# Patient Record
Sex: Male | Born: 1955 | Race: White | Hispanic: No | Marital: Married | Smoking: Never smoker
Health system: Southern US, Community
[De-identification: ages and names within clinical notes are randomized; demographics above are authoritative.]

## PROBLEM LIST (undated history)

## (undated) DIAGNOSIS — E785 Hyperlipidemia, unspecified: Secondary | ICD-10-CM

## (undated) DIAGNOSIS — K219 Gastro-esophageal reflux disease without esophagitis: Secondary | ICD-10-CM

## (undated) DIAGNOSIS — J189 Pneumonia, unspecified organism: Secondary | ICD-10-CM

## (undated) DIAGNOSIS — M797 Fibromyalgia: Secondary | ICD-10-CM

## (undated) DIAGNOSIS — E7800 Pure hypercholesterolemia, unspecified: Secondary | ICD-10-CM

## (undated) DIAGNOSIS — N400 Enlarged prostate without lower urinary tract symptoms: Secondary | ICD-10-CM

## (undated) DIAGNOSIS — K317 Polyp of stomach and duodenum: Secondary | ICD-10-CM

## (undated) DIAGNOSIS — I2510 Atherosclerotic heart disease of native coronary artery without angina pectoris: Secondary | ICD-10-CM

## (undated) DIAGNOSIS — I77810 Thoracic aortic ectasia: Secondary | ICD-10-CM

## (undated) DIAGNOSIS — E669 Obesity, unspecified: Secondary | ICD-10-CM

## (undated) DIAGNOSIS — I10 Essential (primary) hypertension: Secondary | ICD-10-CM

## (undated) DIAGNOSIS — K449 Diaphragmatic hernia without obstruction or gangrene: Secondary | ICD-10-CM

## (undated) DIAGNOSIS — I208 Other forms of angina pectoris: Secondary | ICD-10-CM

## (undated) DIAGNOSIS — Z8739 Personal history of other diseases of the musculoskeletal system and connective tissue: Secondary | ICD-10-CM

## (undated) DIAGNOSIS — K222 Esophageal obstruction: Secondary | ICD-10-CM

## (undated) DIAGNOSIS — J42 Unspecified chronic bronchitis: Secondary | ICD-10-CM

## (undated) HISTORY — DX: Atherosclerotic heart disease of native coronary artery without angina pectoris: I25.10

## (undated) HISTORY — DX: Esophageal obstruction: K22.2

## (undated) HISTORY — DX: Thoracic aortic ectasia: I77.810

## (undated) HISTORY — DX: Polyp of stomach and duodenum: K31.7

## (undated) HISTORY — PX: COLONOSCOPY W/ POLYPECTOMY: SHX1380

## (undated) HISTORY — DX: Essential (primary) hypertension: I10

## (undated) HISTORY — DX: Diaphragmatic hernia without obstruction or gangrene: K44.9

## (undated) HISTORY — PX: ESOPHAGOGASTRODUODENOSCOPY (EGD) WITH ESOPHAGEAL DILATION: SHX5812

## (undated) HISTORY — DX: Benign prostatic hyperplasia without lower urinary tract symptoms: N40.0

## (undated) HISTORY — DX: Fibromyalgia: M79.7

## (undated) HISTORY — DX: Hyperlipidemia, unspecified: E78.5

## (undated) HISTORY — DX: Obesity, unspecified: E66.9

## (undated) HISTORY — DX: Gastro-esophageal reflux disease without esophagitis: K21.9

## (undated) HISTORY — PX: CORONARY ANGIOPLASTY WITH STENT PLACEMENT: SHX49

---

## 1999-11-13 ENCOUNTER — Emergency Department (HOSPITAL_COMMUNITY): Admission: EM | Admit: 1999-11-13 | Discharge: 1999-11-14 | Payer: Self-pay | Admitting: Emergency Medicine

## 1999-11-13 ENCOUNTER — Encounter: Payer: Self-pay | Admitting: Emergency Medicine

## 2000-01-14 ENCOUNTER — Encounter (INDEPENDENT_AMBULATORY_CARE_PROVIDER_SITE_OTHER): Payer: Self-pay

## 2000-01-14 ENCOUNTER — Other Ambulatory Visit: Admission: RE | Admit: 2000-01-14 | Discharge: 2000-01-14 | Payer: Self-pay | Admitting: *Deleted

## 2004-09-02 ENCOUNTER — Ambulatory Visit: Payer: Self-pay | Admitting: Cardiology

## 2004-09-04 ENCOUNTER — Ambulatory Visit (HOSPITAL_COMMUNITY): Admission: RE | Admit: 2004-09-04 | Discharge: 2004-09-05 | Payer: Self-pay | Admitting: Cardiology

## 2004-09-04 ENCOUNTER — Ambulatory Visit: Payer: Self-pay | Admitting: Cardiology

## 2004-09-18 ENCOUNTER — Ambulatory Visit: Payer: Self-pay | Admitting: Cardiology

## 2004-12-24 ENCOUNTER — Ambulatory Visit: Payer: Self-pay | Admitting: Cardiology

## 2004-12-25 ENCOUNTER — Emergency Department (HOSPITAL_COMMUNITY): Admission: EM | Admit: 2004-12-25 | Discharge: 2004-12-25 | Payer: Self-pay | Admitting: Emergency Medicine

## 2004-12-30 ENCOUNTER — Ambulatory Visit: Payer: Self-pay | Admitting: Cardiology

## 2005-02-20 ENCOUNTER — Ambulatory Visit: Payer: Self-pay | Admitting: Internal Medicine

## 2006-02-10 ENCOUNTER — Ambulatory Visit: Payer: Self-pay | Admitting: Internal Medicine

## 2006-02-10 LAB — CONVERTED CEMR LAB
ALT: 37 units/L (ref 0–40)
AST: 25 units/L (ref 0–37)
Albumin: 3.6 g/dL (ref 3.5–5.2)
Alkaline Phosphatase: 84 units/L (ref 39–117)
BUN: 14 mg/dL (ref 6–23)
Basophils Absolute: 0 10*3/uL (ref 0.0–0.1)
Basophils Relative: 0.1 % (ref 0.0–1.0)
CO2: 29 meq/L (ref 19–32)
Calcium: 9.5 mg/dL (ref 8.4–10.5)
Chloride: 105 meq/L (ref 96–112)
Chol/HDL Ratio, serum: 5.8
Cholesterol: 223 mg/dL (ref 0–200)
Creatinine, Ser: 0.9 mg/dL (ref 0.4–1.5)
Eosinophil percent: 2.9 % (ref 0.0–5.0)
GFR calc non Af Amer: 95 mL/min
Glomerular Filtration Rate, Af Am: 115 mL/min/{1.73_m2}
Glucose, Bld: 96 mg/dL (ref 70–99)
HCT: 50.6 % (ref 39.0–52.0)
HDL: 38.6 mg/dL — ABNORMAL LOW (ref >39.0)
Hemoglobin: 16.6 g/dL (ref 13.0–17.0)
LDL DIRECT: 154.2 mg/dL
Lymphocytes Relative: 13.7 % (ref 12.0–46.0)
MCHC: 32.9 g/dL (ref 30.0–36.0)
MCV: 92.3 fL (ref 78.0–100.0)
Monocytes Absolute: 1.4 10*3/uL — ABNORMAL HIGH (ref 0.2–0.7)
Monocytes Relative: 11.4 % — ABNORMAL HIGH (ref 3.0–11.0)
Neutro Abs: 9.2 10*3/uL — ABNORMAL HIGH (ref 1.4–7.7)
Neutrophils Relative %: 71.9 % (ref 43.0–77.0)
PSA: 0.87 ng/mL (ref 0.10–4.00)
Platelets: 303 10*3/uL (ref 150–400)
Potassium: 4 meq/L (ref 3.5–5.1)
RBC: 5.48 M/uL (ref 4.22–5.81)
RDW: 12.5 % (ref 11.5–14.6)
Sodium: 142 meq/L (ref 135–145)
TSH: 2.22 microintl units/mL (ref 0.35–5.50)
Total Bilirubin: 1.1 mg/dL (ref 0.3–1.2)
Total Protein: 7.9 g/dL (ref 6.0–8.3)
Triglyceride fasting, serum: 147 mg/dL (ref 0–149)
VLDL: 29 mg/dL (ref 0–40)
WBC: 12.7 10*3/uL — ABNORMAL HIGH (ref 4.5–10.5)

## 2006-02-11 ENCOUNTER — Ambulatory Visit: Payer: Self-pay | Admitting: Cardiology

## 2006-02-16 ENCOUNTER — Ambulatory Visit: Payer: Self-pay

## 2006-02-24 ENCOUNTER — Ambulatory Visit: Payer: Self-pay | Admitting: Internal Medicine

## 2006-03-05 ENCOUNTER — Ambulatory Visit: Payer: Self-pay | Admitting: Cardiology

## 2006-03-05 LAB — CONVERTED CEMR LAB
BUN: 10 mg/dL (ref 6–23)
Chloride: 100 meq/L (ref 96–112)
Creatinine, Ser: 0.8 mg/dL (ref 0.4–1.5)
GFR calc non Af Amer: 109 mL/min

## 2006-04-09 ENCOUNTER — Ambulatory Visit: Payer: Self-pay | Admitting: Cardiology

## 2006-04-15 ENCOUNTER — Ambulatory Visit: Payer: Self-pay | Admitting: Internal Medicine

## 2006-04-15 LAB — CONVERTED CEMR LAB
ALT: 38 units/L (ref 0–40)
AST: 32 units/L (ref 0–37)
Albumin: 3.9 g/dL (ref 3.5–5.2)
Alkaline Phosphatase: 87 units/L (ref 39–117)
BUN: 16 mg/dL (ref 6–23)
Bilirubin, Direct: 0.2 mg/dL (ref 0.0–0.3)
CO2: 30 meq/L (ref 19–32)
Calcium: 9.3 mg/dL (ref 8.4–10.5)
Chloride: 103 meq/L (ref 96–112)
Cholesterol: 144 mg/dL (ref 0–200)
Creatinine, Ser: 0.8 mg/dL (ref 0.4–1.5)
GFR calc Af Amer: 132 mL/min
GFR calc non Af Amer: 109 mL/min
Glucose, Bld: 79 mg/dL (ref 70–99)
HDL: 32.5 mg/dL — ABNORMAL LOW (ref >39.0)
LDL Cholesterol: 88 mg/dL (ref 0–99)
Potassium: 3.9 meq/L (ref 3.5–5.1)
Sodium: 143 meq/L (ref 135–145)
Total Bilirubin: 1.1 mg/dL (ref 0.3–1.2)
Total CHOL/HDL Ratio: 4.4
Total Protein: 7.6 g/dL (ref 6.0–8.3)
Triglycerides: 119 mg/dL (ref 0–149)
VLDL: 24 mg/dL (ref 0–40)

## 2006-05-26 ENCOUNTER — Ambulatory Visit: Payer: Self-pay | Admitting: Cardiology

## 2006-09-16 DIAGNOSIS — IMO0001 Reserved for inherently not codable concepts without codable children: Secondary | ICD-10-CM | POA: Insufficient documentation

## 2006-09-16 DIAGNOSIS — K219 Gastro-esophageal reflux disease without esophagitis: Secondary | ICD-10-CM | POA: Insufficient documentation

## 2006-09-16 DIAGNOSIS — I10 Essential (primary) hypertension: Secondary | ICD-10-CM | POA: Insufficient documentation

## 2006-09-16 DIAGNOSIS — N400 Enlarged prostate without lower urinary tract symptoms: Secondary | ICD-10-CM | POA: Insufficient documentation

## 2007-06-30 ENCOUNTER — Ambulatory Visit: Payer: Self-pay | Admitting: Internal Medicine

## 2007-06-30 LAB — CONVERTED CEMR LAB
Albumin: 4.2 g/dL (ref 3.5–5.2)
Alkaline Phosphatase: 75 units/L (ref 39–117)
BUN: 12 mg/dL (ref 6–23)
Basophils Relative: 0.5 % (ref 0.0–1.0)
Blood in Urine, dipstick: NEGATIVE
Creatinine, Ser: 0.9 mg/dL (ref 0.4–1.5)
Eosinophils Absolute: 0.2 10*3/uL (ref 0.0–0.7)
Eosinophils Relative: 2.4 % (ref 0.0–5.0)
GFR calc Af Amer: 114 mL/min
GFR calc non Af Amer: 95 mL/min
Glucose, Bld: 106 mg/dL — ABNORMAL HIGH (ref 70–99)
Glucose, Urine, Semiquant: NEGATIVE
HCT: 49.3 % (ref 39.0–52.0)
HDL: 29.5 mg/dL — ABNORMAL LOW (ref >39.0)
Hemoglobin: 16.3 g/dL (ref 13.0–17.0)
MCV: 91.7 fL (ref 78.0–100.0)
Monocytes Absolute: 1 10*3/uL (ref 0.1–1.0)
Monocytes Relative: 12.8 % — ABNORMAL HIGH (ref 3.0–12.0)
Neutro Abs: 4.7 10*3/uL (ref 1.4–7.7)
Nitrite: NEGATIVE
PSA: 0.54 ng/mL (ref 0.10–4.00)
Platelets: 311 10*3/uL (ref 150–400)
Potassium: 4.2 meq/L (ref 3.5–5.1)
RBC: 5.37 M/uL (ref 4.22–5.81)
Specific Gravity, Urine: 1.015
Total CHOL/HDL Ratio: 5.6
Total Protein: 7.7 g/dL (ref 6.0–8.3)
WBC Urine, dipstick: NEGATIVE
WBC: 7.7 10*3/uL (ref 4.5–10.5)
pH: 6

## 2007-07-07 ENCOUNTER — Ambulatory Visit: Payer: Self-pay | Admitting: Internal Medicine

## 2007-07-07 DIAGNOSIS — M109 Gout, unspecified: Secondary | ICD-10-CM | POA: Insufficient documentation

## 2007-07-07 DIAGNOSIS — I2510 Atherosclerotic heart disease of native coronary artery without angina pectoris: Secondary | ICD-10-CM | POA: Insufficient documentation

## 2007-07-14 ENCOUNTER — Ambulatory Visit: Payer: Self-pay

## 2007-07-19 ENCOUNTER — Encounter: Payer: Self-pay | Admitting: Internal Medicine

## 2007-07-19 ENCOUNTER — Ambulatory Visit: Payer: Self-pay

## 2007-07-26 ENCOUNTER — Encounter: Payer: Self-pay | Admitting: Internal Medicine

## 2007-08-25 ENCOUNTER — Telehealth: Payer: Self-pay | Admitting: Internal Medicine

## 2007-08-27 DIAGNOSIS — K449 Diaphragmatic hernia without obstruction or gangrene: Secondary | ICD-10-CM | POA: Insufficient documentation

## 2007-08-27 DIAGNOSIS — D131 Benign neoplasm of stomach: Secondary | ICD-10-CM | POA: Insufficient documentation

## 2007-08-27 DIAGNOSIS — K222 Esophageal obstruction: Secondary | ICD-10-CM | POA: Insufficient documentation

## 2007-08-31 ENCOUNTER — Ambulatory Visit: Payer: Self-pay | Admitting: Internal Medicine

## 2007-11-18 ENCOUNTER — Encounter: Payer: Self-pay | Admitting: Internal Medicine

## 2007-11-22 ENCOUNTER — Ambulatory Visit: Payer: Self-pay | Admitting: Internal Medicine

## 2007-11-23 ENCOUNTER — Encounter: Payer: Self-pay | Admitting: Internal Medicine

## 2007-11-23 ENCOUNTER — Ambulatory Visit: Payer: Self-pay | Admitting: Internal Medicine

## 2007-11-26 ENCOUNTER — Encounter: Payer: Self-pay | Admitting: Internal Medicine

## 2007-12-01 ENCOUNTER — Ambulatory Visit: Payer: Self-pay | Admitting: Internal Medicine

## 2007-12-01 DIAGNOSIS — E785 Hyperlipidemia, unspecified: Secondary | ICD-10-CM | POA: Insufficient documentation

## 2007-12-01 LAB — HM COLONOSCOPY

## 2007-12-07 ENCOUNTER — Telehealth: Payer: Self-pay | Admitting: Internal Medicine

## 2007-12-07 LAB — CONVERTED CEMR LAB
AST: 64 units/L — ABNORMAL HIGH (ref 0–37)
Alkaline Phosphatase: 77 units/L (ref 39–117)
Bilirubin, Direct: 0.2 mg/dL (ref 0.0–0.3)
Chloride: 103 meq/L (ref 96–112)
Cholesterol: 163 mg/dL (ref 0–200)
GFR calc Af Amer: 114 mL/min
GFR calc non Af Amer: 95 mL/min
Glucose, Bld: 100 mg/dL — ABNORMAL HIGH (ref 70–99)
LDL Cholesterol: 94 mg/dL (ref 0–99)
Potassium: 3.8 meq/L (ref 3.5–5.1)
Sodium: 145 meq/L (ref 135–145)
VLDL: 37 mg/dL (ref 0–40)

## 2007-12-15 ENCOUNTER — Ambulatory Visit: Payer: Self-pay | Admitting: Family Medicine

## 2007-12-15 LAB — CONVERTED CEMR LAB
Basophils Absolute: 0 10*3/uL (ref 0.0–0.1)
Basophils Relative: 0 % (ref 0.0–3.0)
Eosinophils Absolute: 0 10*3/uL (ref 0.0–0.7)
Hemoglobin: 16.5 g/dL (ref 13.0–17.0)
MCHC: 34.9 g/dL (ref 30.0–36.0)
MCV: 89.4 fL (ref 78.0–100.0)
Monocytes Absolute: 0.8 10*3/uL (ref 0.1–1.0)
Neutro Abs: 5.5 10*3/uL (ref 1.4–7.7)
RBC: 5.3 M/uL (ref 4.22–5.81)
RDW: 12.3 % (ref 11.5–14.6)

## 2007-12-22 ENCOUNTER — Telehealth: Payer: Self-pay | Admitting: Family Medicine

## 2008-07-13 ENCOUNTER — Ambulatory Visit: Payer: Self-pay | Admitting: Internal Medicine

## 2008-07-13 LAB — CONVERTED CEMR LAB
AST: 36 units/L (ref 0–37)
Albumin: 4.1 g/dL (ref 3.5–5.2)
Alkaline Phosphatase: 92 units/L (ref 39–117)
Basophils Relative: 0 % (ref 0.0–3.0)
Blood in Urine, dipstick: NEGATIVE
CO2: 28 meq/L (ref 19–32)
Calcium: 9.3 mg/dL (ref 8.4–10.5)
Eosinophils Absolute: 0.1 10*3/uL (ref 0.0–0.7)
HDL: 37.6 mg/dL — ABNORMAL LOW (ref >39.00)
Hemoglobin: 15.9 g/dL (ref 13.0–17.0)
Lymphocytes Relative: 22 % (ref 12.0–46.0)
MCHC: 34 g/dL (ref 30.0–36.0)
Monocytes Relative: 14.8 % — ABNORMAL HIGH (ref 3.0–12.0)
Neutro Abs: 4.7 10*3/uL (ref 1.4–7.7)
Nitrite: NEGATIVE
Potassium: 4.5 meq/L (ref 3.5–5.1)
RBC: 4.93 M/uL (ref 4.22–5.81)
Sodium: 143 meq/L (ref 135–145)
Specific Gravity, Urine: 1.02
Total CHOL/HDL Ratio: 4
Total Protein: 7.5 g/dL (ref 6.0–8.3)
WBC Urine, dipstick: NEGATIVE

## 2008-08-29 ENCOUNTER — Ambulatory Visit: Payer: Self-pay | Admitting: Internal Medicine

## 2008-09-07 ENCOUNTER — Telehealth: Payer: Self-pay | Admitting: Internal Medicine

## 2009-02-15 ENCOUNTER — Ambulatory Visit: Payer: Self-pay | Admitting: Family Medicine

## 2009-07-06 ENCOUNTER — Ambulatory Visit: Payer: Self-pay | Admitting: Cardiovascular Disease

## 2009-08-29 ENCOUNTER — Ambulatory Visit: Payer: Self-pay | Admitting: Internal Medicine

## 2009-08-29 LAB — CONVERTED CEMR LAB
ALT: 29 units/L (ref 0–53)
AST: 30 units/L (ref 0–37)
Basophils Relative: 0.7 % (ref 0.0–3.0)
Bilirubin, Direct: 0.2 mg/dL (ref 0.0–0.3)
Chloride: 103 meq/L (ref 96–112)
Creatinine, Ser: 0.8 mg/dL (ref 0.4–1.5)
Eosinophils Relative: 3 % (ref 0.0–5.0)
HCT: 45.7 % (ref 39.0–52.0)
MCV: 94.2 fL (ref 78.0–100.0)
Monocytes Absolute: 1.1 10*3/uL — ABNORMAL HIGH (ref 0.1–1.0)
Monocytes Relative: 14 % — ABNORMAL HIGH (ref 3.0–12.0)
Neutrophils Relative %: 54.5 % (ref 43.0–77.0)
Nitrite: NEGATIVE
PSA: 1.03 ng/mL (ref 0.10–4.00)
Potassium: 4.6 meq/L (ref 3.5–5.1)
RBC: 4.85 M/uL (ref 4.22–5.81)
Total CHOL/HDL Ratio: 5
Total Protein: 7.2 g/dL (ref 6.0–8.3)
Urobilinogen, UA: 1
VLDL: 42 mg/dL — ABNORMAL HIGH (ref 0.0–40.0)
WBC Urine, dipstick: NEGATIVE
WBC: 7.8 10*3/uL (ref 4.5–10.5)

## 2009-09-05 ENCOUNTER — Ambulatory Visit: Payer: Self-pay | Admitting: Internal Medicine

## 2009-09-05 DIAGNOSIS — E291 Testicular hypofunction: Secondary | ICD-10-CM | POA: Insufficient documentation

## 2009-09-18 ENCOUNTER — Encounter (INDEPENDENT_AMBULATORY_CARE_PROVIDER_SITE_OTHER): Payer: Self-pay | Admitting: *Deleted

## 2009-09-19 ENCOUNTER — Ambulatory Visit: Payer: Self-pay | Admitting: Internal Medicine

## 2009-09-19 LAB — CONVERTED CEMR LAB: Prolactin: 5.3 ng/mL

## 2009-09-24 LAB — CONVERTED CEMR LAB: LH: 1.4 milliintl units/mL — ABNORMAL LOW (ref 1.5–9.3)

## 2009-10-02 ENCOUNTER — Telehealth: Payer: Self-pay | Admitting: Internal Medicine

## 2009-10-02 ENCOUNTER — Ambulatory Visit: Payer: Self-pay | Admitting: Internal Medicine

## 2009-10-02 DIAGNOSIS — D485 Neoplasm of uncertain behavior of skin: Secondary | ICD-10-CM | POA: Insufficient documentation

## 2009-10-03 ENCOUNTER — Ambulatory Visit: Payer: Self-pay | Admitting: Internal Medicine

## 2009-10-10 ENCOUNTER — Ambulatory Visit: Payer: Self-pay | Admitting: Internal Medicine

## 2009-10-10 DIAGNOSIS — J329 Chronic sinusitis, unspecified: Secondary | ICD-10-CM | POA: Insufficient documentation

## 2009-10-12 ENCOUNTER — Ambulatory Visit: Payer: Self-pay | Admitting: Cardiology

## 2009-10-15 ENCOUNTER — Ambulatory Visit: Payer: Self-pay | Admitting: Family Medicine

## 2009-10-16 ENCOUNTER — Encounter: Payer: Self-pay | Admitting: Internal Medicine

## 2009-10-16 DIAGNOSIS — G459 Transient cerebral ischemic attack, unspecified: Secondary | ICD-10-CM | POA: Insufficient documentation

## 2009-10-16 LAB — CONVERTED CEMR LAB
AST: 27 units/L (ref 0–37)
Albumin: 4.3 g/dL (ref 3.5–5.2)
Alkaline Phosphatase: 75 units/L (ref 39–117)
Basophils Relative: 0.7 % (ref 0.0–3.0)
Bilirubin, Direct: 0.2 mg/dL (ref 0.0–0.3)
Chloride: 103 meq/L (ref 96–112)
Eosinophils Relative: 1.1 % (ref 0.0–5.0)
GFR calc non Af Amer: 119.16 mL/min (ref >60)
Glucose, Bld: 96 mg/dL (ref 70–99)
Hemoglobin: 16.4 g/dL (ref 13.0–17.0)
Lymphocytes Relative: 20.7 % (ref 12.0–46.0)
Monocytes Relative: 10.4 % (ref 3.0–12.0)
Neutro Abs: 5.6 10*3/uL (ref 1.4–7.7)
Neutrophils Relative %: 67.1 % (ref 43.0–77.0)
Phosphorus: 4 mg/dL (ref 2.3–4.6)
Potassium: 4.2 meq/L (ref 3.5–5.1)
RBC: 5.05 M/uL (ref 4.22–5.81)
Sodium: 142 meq/L (ref 135–145)
TSH: 1.52 microintl units/mL (ref 0.35–5.50)
Total Protein: 7.2 g/dL (ref 6.0–8.3)
WBC: 8.4 10*3/uL (ref 4.5–10.5)

## 2009-10-17 ENCOUNTER — Encounter: Payer: Self-pay | Admitting: Internal Medicine

## 2009-10-17 ENCOUNTER — Ambulatory Visit: Payer: Self-pay

## 2009-10-17 ENCOUNTER — Telehealth: Payer: Self-pay | Admitting: Cardiovascular Disease

## 2009-10-18 ENCOUNTER — Encounter: Payer: Self-pay | Admitting: Internal Medicine

## 2009-10-22 ENCOUNTER — Encounter (INDEPENDENT_AMBULATORY_CARE_PROVIDER_SITE_OTHER): Payer: Self-pay | Admitting: *Deleted

## 2009-10-22 ENCOUNTER — Ambulatory Visit: Payer: Self-pay | Admitting: Family Medicine

## 2009-10-23 ENCOUNTER — Telehealth: Payer: Self-pay | Admitting: Family Medicine

## 2009-10-24 ENCOUNTER — Ambulatory Visit: Payer: Self-pay | Admitting: Family Medicine

## 2009-10-25 ENCOUNTER — Telehealth: Payer: Self-pay | Admitting: *Deleted

## 2009-10-29 ENCOUNTER — Encounter: Payer: Self-pay | Admitting: Cardiovascular Disease

## 2009-10-29 ENCOUNTER — Ambulatory Visit: Payer: Self-pay | Admitting: Cardiovascular Disease

## 2009-10-30 ENCOUNTER — Telehealth: Payer: Self-pay | Admitting: Cardiovascular Disease

## 2009-10-30 LAB — CONVERTED CEMR LAB
BUN: 18 mg/dL (ref 6–23)
Basophils Absolute: 0 10*3/uL (ref 0.0–0.1)
CO2: 28 meq/L (ref 19–32)
Calcium: 9.7 mg/dL (ref 8.4–10.5)
Eosinophils Absolute: 0.1 10*3/uL (ref 0.0–0.7)
GFR calc non Af Amer: 90.09 mL/min (ref >60)
Glucose, Bld: 111 mg/dL — ABNORMAL HIGH (ref 70–99)
HCT: 46.9 % (ref 39.0–52.0)
Hemoglobin: 16.2 g/dL (ref 13.0–17.0)
Lymphs Abs: 1.9 10*3/uL (ref 0.7–4.0)
MCHC: 34.5 g/dL (ref 30.0–36.0)
Monocytes Relative: 13.8 % — ABNORMAL HIGH (ref 3.0–12.0)
Neutro Abs: 6 10*3/uL (ref 1.4–7.7)
Platelets: 267 10*3/uL (ref 150.0–400.0)
Prothrombin Time: 11.3 s (ref 9.7–11.8)
RDW: 12.8 % (ref 11.5–14.6)

## 2009-10-31 ENCOUNTER — Inpatient Hospital Stay (HOSPITAL_BASED_OUTPATIENT_CLINIC_OR_DEPARTMENT_OTHER): Admission: RE | Admit: 2009-10-31 | Discharge: 2009-10-31 | Payer: Self-pay | Admitting: Cardiovascular Disease

## 2009-10-31 ENCOUNTER — Ambulatory Visit: Payer: Self-pay | Admitting: Cardiovascular Disease

## 2009-10-31 ENCOUNTER — Ambulatory Visit (HOSPITAL_COMMUNITY): Admission: RE | Admit: 2009-10-31 | Discharge: 2009-11-01 | Payer: Self-pay | Admitting: Cardiovascular Disease

## 2009-11-07 ENCOUNTER — Ambulatory Visit: Payer: Self-pay | Admitting: Family Medicine

## 2009-11-08 ENCOUNTER — Encounter: Payer: Self-pay | Admitting: Cardiovascular Disease

## 2009-11-19 ENCOUNTER — Encounter: Payer: Self-pay | Admitting: Cardiology

## 2009-11-22 ENCOUNTER — Ambulatory Visit: Payer: Self-pay | Admitting: Internal Medicine

## 2009-11-26 ENCOUNTER — Telehealth: Payer: Self-pay | Admitting: Internal Medicine

## 2009-11-27 ENCOUNTER — Telehealth: Payer: Self-pay | Admitting: Internal Medicine

## 2009-11-30 ENCOUNTER — Encounter (INDEPENDENT_AMBULATORY_CARE_PROVIDER_SITE_OTHER): Payer: Self-pay | Admitting: *Deleted

## 2009-12-04 ENCOUNTER — Encounter: Payer: Self-pay | Admitting: Cardiovascular Disease

## 2009-12-05 ENCOUNTER — Ambulatory Visit: Payer: Self-pay | Admitting: Family Medicine

## 2009-12-25 ENCOUNTER — Encounter: Payer: Self-pay | Admitting: Internal Medicine

## 2010-01-16 ENCOUNTER — Ambulatory Visit: Payer: Self-pay | Admitting: Internal Medicine

## 2010-02-06 ENCOUNTER — Ambulatory Visit
Admission: RE | Admit: 2010-02-06 | Discharge: 2010-02-06 | Payer: Self-pay | Source: Home / Self Care | Attending: Internal Medicine | Admitting: Internal Medicine

## 2010-02-06 ENCOUNTER — Other Ambulatory Visit: Payer: Self-pay | Admitting: Internal Medicine

## 2010-02-06 LAB — CBC WITH DIFFERENTIAL/PLATELET
Basophils Absolute: 0.1 10*3/uL (ref 0.0–0.1)
Basophils Relative: 0.6 % (ref 0.0–3.0)
Eosinophils Absolute: 0.2 10*3/uL (ref 0.0–0.7)
Eosinophils Relative: 1.7 % (ref 0.0–5.0)
HCT: 47.1 % (ref 39.0–52.0)
Hemoglobin: 16.4 g/dL (ref 13.0–17.0)
Lymphocytes Relative: 15.9 % (ref 12.0–46.0)
Lymphs Abs: 1.8 10*3/uL (ref 0.7–4.0)
MCHC: 34.8 g/dL (ref 30.0–36.0)
MCV: 92.7 fl (ref 78.0–100.0)
Monocytes Absolute: 1.3 10*3/uL — ABNORMAL HIGH (ref 0.1–1.0)
Monocytes Relative: 11.5 % (ref 3.0–12.0)
Neutro Abs: 7.9 10*3/uL — ABNORMAL HIGH (ref 1.4–7.7)
Neutrophils Relative %: 70.3 % (ref 43.0–77.0)
Platelets: 247 10*3/uL (ref 150.0–400.0)
RBC: 5.08 Mil/uL (ref 4.22–5.81)
RDW: 12.9 % (ref 11.5–14.6)
WBC: 11.3 10*3/uL — ABNORMAL HIGH (ref 4.5–10.5)

## 2010-02-06 LAB — BASIC METABOLIC PANEL
BUN: 20 mg/dL (ref 6–23)
CO2: 27 mEq/L (ref 19–32)
Calcium: 9.7 mg/dL (ref 8.4–10.5)
Chloride: 103 mEq/L (ref 96–112)
Creatinine, Ser: 0.8 mg/dL (ref 0.4–1.5)
GFR: 111.91 mL/min (ref >60.00)
Glucose, Bld: 92 mg/dL (ref 70–99)
Potassium: 4 mEq/L (ref 3.5–5.1)
Sodium: 138 mEq/L (ref 135–145)

## 2010-02-06 LAB — LIPID PANEL
Cholesterol: 145 mg/dL (ref 0–200)
HDL: 35.4 mg/dL — ABNORMAL LOW (ref >39.00)
Total CHOL/HDL Ratio: 4
Triglycerides: 205 mg/dL — ABNORMAL HIGH (ref 0.0–149.0)
VLDL: 41 mg/dL — ABNORMAL HIGH (ref 0.0–40.0)

## 2010-02-06 LAB — HEPATIC FUNCTION PANEL
ALT: 39 U/L (ref 0–53)
AST: 38 U/L — ABNORMAL HIGH (ref 0–37)
Albumin: 4.1 g/dL (ref 3.5–5.2)
Alkaline Phosphatase: 76 U/L (ref 39–117)
Bilirubin, Direct: 0.3 mg/dL (ref 0.0–0.3)
Total Bilirubin: 1.5 mg/dL — ABNORMAL HIGH (ref 0.3–1.2)
Total Protein: 7.4 g/dL (ref 6.0–8.3)

## 2010-02-06 LAB — LDL CHOLESTEROL, DIRECT: Direct LDL: 84.6 mg/dL

## 2010-02-06 LAB — TSH: TSH: 1.7 u[IU]/mL (ref 0.35–5.50)

## 2010-02-13 ENCOUNTER — Telehealth: Payer: Self-pay | Admitting: Internal Medicine

## 2010-02-13 ENCOUNTER — Ambulatory Visit
Admission: RE | Admit: 2010-02-13 | Discharge: 2010-02-13 | Payer: Self-pay | Source: Home / Self Care | Attending: Internal Medicine | Admitting: Internal Medicine

## 2010-02-18 ENCOUNTER — Ambulatory Visit: Admit: 2010-02-18 | Payer: Self-pay | Admitting: Internal Medicine

## 2010-03-05 NOTE — Miscellaneous (Signed)
Summary: Orders Update  Clinical Lists Changes  Problems: Added new problem of TIA (ICD-435.9) Orders: Added new Test order of Carotid Duplex (Carotid Duplex) - Signed 

## 2010-03-05 NOTE — Assessment & Plan Note (Signed)
Summary: bp elevated/njr   Vital Signs:  Patient profile:   55 year old male Height:      69.5 inches (176.53 cm) Weight:      238 pounds (108.18 kg) O2 Sat:      99 % on Room air Temp:     98.5 degrees F (36.94 degrees C) oral Pulse rate:   68 / minute BP sitting:   174 / 102  (left arm)  Vitals Entered By: Josph Macho RMA (October 15, 2009 11:42 AM)  O2 Flow:  Room air CC: BP elevated/ CF Is Patient Diabetic? No   History of Present Illness: Patient in today with concerns regarding his elevated BP. Last week he was in and his BP was elevated so he was started on lisinopril 10 mg p.o. daily. He reports the first 3 days after starting the medication he felt better had more energy he had less headaches less dysequilibrium and vibratory sense in his head. Over the last few days his symptoms had worsened once again and he is here today for further evaluation. He has a long-standing history of recurrent sinusitis allergies and has been treated multiple times for acute sinus infection over the last few months. CAT Scan performed just this past Friday does show bilateral maxillary sinuses are infected with air-fluid levels. Of note about the time he started the lisinopril he also had leftover Levaquin at home that he started taking daily. While he was on that medicine he felt somewhat better. He portrays a long history of a mole by up again at age 60 having severe allergies to dogs and cats and recurrent ear infections sinusitis and allergies. Also of note he has a history of CAD and had a stent placed in 2006 had generally been doing well. He does exercise regularly and normally can exercise for upwards of an hour without excessive fatigue. More recently over the last month had episodes of excessive fatigue feeling washed in hot. Becoming short of breath more readily. He is placed on antibiotics initially amoxicillin by urgent care and then Levaquin by ENT Dr. Jearld Fenton he does improve only to  worsen again with antibiotics. In he reports having significant neck pain and swelling on the right which is the site he was mild and prior to taking amoxicillin. Earlier this summer. The swelling in the neck pain have improved somewhat since taking antibiotics but not fully resolved. He reports a long history of going on and off the Pritikin diet. When he does well avoids salt spicy foods and fatty foods he feels much better recently he has been eating worse exercising less and sleeping poorly.   Current Medications (verified): 1)  Aspir-81 81 Mg Tbec (Aspirin) .... Take 1 Tablet By Mouth Once A Day 2)  Plavix 75 Mg Tabs (Clopidogrel Bisulfate) .... Take 1 Tablet By Mouth Once A Day 3)  Toprol Xl 50 Mg Tb24 (Metoprolol Succinate) .... Take 1 Tablet By Mouth Once A Day 4)  Simvastatin 20 Mg Tabs (Simvastatin) .... Take One Tablet By Mouth Daily At Bedtime 5)  Allopurinol 300 Mg Tabs (Allopurinol) .... Take 1 Tab Every Day 6)  Pepcid Ac 10 Mg Tabs (Famotidine) .... As Needed 7)  Coq10 100 Mg Caps (Coenzyme Q10) .... Take 1 Capsule By Mouth Once A Day 8)  Lovaza 1 Gm Caps (Omega-3-Acid Ethyl Esters) .... Take 1 Capsule By Mouth Once A Day 9)  Vitamin D3 1000 Unit Tabs (Cholecalciferol) .... Take 1 Tablet By Mouth Once A Day  10)  Omnaris 50 Mcg/act Susp (Ciclesonide) .Marland Kitchen.. 1 Puff Each Nostril Once Daily 11)  Testosterone Cypionate 200 Mg/ml Oil (Testosterone Cypionate) .Marland Kitchen.. 1 Cc Im Monthly 12)  Lisinopril 10 Mg Tabs (Lisinopril) .... Take 1 Tablet By Mouth Once A Day  Allergies (verified): No Known Drug Allergies  Past History:  Past medical history reviewed for relevance to current acute and chronic problems. Social history (including risk factors) reviewed for relevance to current acute and chronic problems.  Past Medical History: Reviewed history from 10/02/2009 and no changes required. Current Problems:  CORONARY ARTERY DISEASE (ICD-414.00)- Drug eluting stent in the distal right coronary  artery 2006.  HYPERTENSION (ICD-401.9) HYPERLIPIDEMIA (ICD-272.4) GERD (ICD-530.81) GASTRIC POLYP (ICD-211.1) HIATAL HERNIA (ICD-553.3) ESOPHAGEAL STRICTURE (ICD-530.3) GOUT (ICD-274.9) BENIGN PROSTATIC HYPERTROPHY (ICD-600.00)  Social History: Reviewed history from 05/01/2009 and no changes required. 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  Eyes:  No corneal or conjunctival inflammation noted. EOMI.  Ears:  no external deformities.  B/L TMs dull with fluid behind Nose:  mucosal erythema, mucosal edema, mucosal friability, L maxillary sinus tenderness, and R maxillary sinus tenderness.   Mouth:  Oral mucosa and oropharynx without lesions or exudates.  Posterior oropharynx very erythematous Neck:  No deformities, masses noted. Right SCM muscle tender and spasm noted Lungs:  Normal respiratory effort, chest expands symmetrically. Lungs are clear to auscultation, no crackles or wheezes. Heart:  Normal rate and regular rhythm. S1 and S2 normal without gallop, murmur, click, rub or other extra sounds. Abdomen:  Bowel sounds positive,abdomen soft and non-tender without masses, organomegaly or hernias noted. Extremities:  No clubbing, cyanosis, edema, or deformity noted with normal full range of motion of all joints.   Cervical Nodes:  Right anterior LN enlarged and mildly tender L normal Psych:  Cognition and judgment appear intact. Alert and cooperative with normal attention span and concentration. No apparent delusions, illusions, hallucinationsslightly anxious.     Impression & Recommendations:  Problem # 1:  SINUSITIS, CHRONIC (ICD-473.9)  His updated medication  list for this problem includes:    Omnaris 50 Mcg/act Susp (Ciclesonide) .Marland Kitchen... 1 puff each nostril once daily    Cefdinir 300 Mg Caps (Cefdinir) .Marland Kitchen... 1 cap by mouth two times a day x 3 weeks  Orders: ENT Referral (ENT) for second opinion TLB-CBC Platelet - w/Differential (85025-CBCD) TLB-Sedimentation Rate (ESR) (85652-ESR) Specimen Handling (50093) Venipuncture (81829) Continue nasal saline flushes daily Cetirizine in am, Diphenhydramine in pm  Problem # 2:  HYPERTENSION (ICD-401.9)  His updated medication list for this problem includes:    Toprol Xl 50 Mg Tb24 (Metoprolol succinate) .Marland Kitchen... Take 1 tablet by mouth once a day    Lisinopril 10 Mg Tabs (Lisinopril) .Marland Kitchen... Take 1 tablet by mouth bid  Orders: Cardiology Referral (Cardiology) TLB-Renal Function Panel (80069-RENAL) TLB-Hepatic/Liver Function Pnl (80076-HEPATIC) Specimen Handling (93716) Venipuncture (96789) Increased Lisinopril to two times a day, needs to increase sleep and decrease sodium intake  Problem # 3:  FATIGUE (ICD-780.79)  Orders: TLB-TSH (Thyroid Stimulating Hormone) (84443-TSH) Specimen Handling (38101) Venipuncture (75102) Increase sleep and minimize strenous work outs until more information is available  Problem # 4:  TESTICULAR HYPOFUNCTION (ICD-257.2) Would hold all further testosterone injections until BP control is achieved  Problem #  5:  CORONARY ARTERY DISEASE (ICD-414.00)  His updated medication list for this problem includes:    Aspir-81 81 Mg Tbec (Aspirin) .Marland Kitchen... Take 1 tablet by mouth once a day    Plavix 75 Mg Tabs (Clopidogrel bisulfate) .Marland Kitchen... Take 1 tablet by mouth once a day    Toprol Xl 50 Mg Tb24 (Metoprolol succinate) .Marland Kitchen... Take 1 tablet by mouth once a day    Lisinopril 10 Mg Tabs (Lisinopril) .Marland Kitchen... Take 1 tablet by mouth bid  Orders: Cardiology Referral (Cardiology) Referred back to cardiology for further imaging and evaluation at this time, seek immediate medical careif  symptoms worsen or any concerns develop  Problem # 6:  GERD (ICD-530.81)  The following medications were removed from the medication list:    Pepcid Ac 10 Mg Tabs (Famotidine) .Marland Kitchen... As needed His updated medication list for this problem includes:    Famotidine 20 Mg Tabs (Famotidine) .Marland Kitchen... 1 tab by mouth two times a day  Orders: TLB-H. Pylori Abs(Helicobacter Pylori) (86677-HELICO) Specimen Handling (16109) Venipuncture (60454) Avoid offending foods and await testing results  Complete Medication List: 1)  Aspir-81 81 Mg Tbec (Aspirin) .... Take 1 tablet by mouth once a day 2)  Plavix 75 Mg Tabs (Clopidogrel bisulfate) .... Take 1 tablet by mouth once a day 3)  Toprol Xl 50 Mg Tb24 (Metoprolol succinate) .... Take 1 tablet by mouth once a day 4)  Simvastatin 20 Mg Tabs (Simvastatin) .... Take one tablet by mouth daily at bedtime 5)  Allopurinol 300 Mg Tabs (Allopurinol) .... Take 1 tab every day 6)  Coq10 100 Mg Caps (Coenzyme q10) .... Take 1 capsule by mouth once a day 7)  Lovaza 1 Gm Caps (Omega-3-acid ethyl esters) .... Take 1 capsule by mouth once a day 8)  Vitamin D3 1000 Unit Tabs (Cholecalciferol) .... Take 1 tablet by mouth once a day 9)  Omnaris 50 Mcg/act Susp (Ciclesonide) .Marland Kitchen.. 1 puff each nostril once daily 10)  Testosterone Cypionate 200 Mg/ml Oil (Testosterone cypionate) .Marland Kitchen.. 1 cc im monthly 11)  Lisinopril 10 Mg Tabs (Lisinopril) .... Take 1 tablet by mouth bid 12)  Cefdinir 300 Mg Caps (Cefdinir) .Marland Kitchen.. 1 cap by mouth two times a day x 3 weeks 13)  Cetirizine Hcl 10 Mg Tabs (Cetirizine hcl) .Marland Kitchen.. 1 tab by mouth once daily 14)  Diphenhydramine Hcl 25 Mg Caps (Diphenhydramine hcl) .Marland Kitchen.. 1-2 tabs by mouth at bedtime 15)  Famotidine 20 Mg Tabs (Famotidine) .Marland Kitchen.. 1 tab by mouth two times a day 16)  Align 4 Mg Caps (Probiotic product) .Marland Kitchen.. 1 cap by mouth qd  Patient Instructions: 1)  Please schedule a follow-up appointment in 1 week for BP check  2)  Limit your Sodium(salt) .    3)  Avoid foods high in acid(tomatoes, citrus juices,spicy foods).Avoid eating within two hours of lying down or before exercising. Do not over eat: try smaller more frequent meals. Elevate head of bed twelve inches when sleeping.  4)  Take your antibiotic as prescribed until ALL of it is gone, but stop if you develop a rash or swelling and contact our office as soon as possible.  5)  Acute sinusitis symptoms for less than 10 days are not helped by antibiotics. Use warm moist compresses, and over the counter decongestants( only as directed). Call if no improvement in 5-7 days, sooner if increasing pain, fever, or new symptoms. Do nasal saline flushes daily 6)  Ada a probiotic such as Align caps daily for at least the  next month Prescriptions: FAMOTIDINE 20 MG TABS (FAMOTIDINE) 1 tab by mouth two times a day  #60 x 0   Entered and Authorized by:   Danise Edge MD   Signed by:   Danise Edge MD on 10/15/2009   Method used:   Historical   RxID:   [id number] LISINOPRIL 10 MG TABS (LISINOPRIL) Take 1 tablet by mouth bid  #60 x 1   Entered and Authorized by:   Danise Edge MD   Signed by:   Danise Edge MD on 10/15/2009   Method used:   Electronically to        [facility] (retail)       [address]       [place], [place]  [id number]       Ph: [phone] or [phone]       Fax: [phone]   RxID:   [id number] CEFDINIR 300 MG CAPS (CEFDINIR) 1 cap by mouth two times a day x 3 weeks  #42 x 0   Entered and Authorized by:   Danise Edge MD   Signed by:   Danise Edge MD on 10/15/2009   Method used:   Electronically to        [facility] (retail)       [address]       [place], [place]  [id number]       Ph: [phone] or [phone]       Fax: [phone]   RxID:   [id number]

## 2010-03-05 NOTE — Progress Notes (Signed)
Summary: wants call  Phone Note Call from Patient Call back at [phone]   Caller: vm Reason for Call: Talk to Nurse Summary of Call: Dr. Cato Mulligan was going to look into ENT doctor for sinus problem I have.  Dr. Yancey Flemings, a good friend of mine, had comments that  want to pass onto Dr. Cato Mulligan.  Please call me.   Initial call taken by: Rudy Jew, RN,  November 26, 2009 9:27 AM  Follow-up for Phone Call        Pt wants Dr. Cato Mulligan to know that his sinus CT scan had a blockage and was seen at Dr. Allene Pyo office.   Did not seem to be worried about this, but he cannot hear well.   Has now been on 30 40 days of antibiotics, and still cannot hear.  Dr. Marina Goodell has suggested he see Dr. Ermalinda Barrios.  Needs to fly next  week and the folllowing week. [phone] Follow-up by: Lynann Beaver CMA AAMA,  November 26, 2009 10:19 AM  Additional Follow-up for Phone Call Additional follow up Details #1::        ok to see dr Jac Canavan Additional Follow-up by: Birdie Sons MD,  November 26, 2009 3:53 PM    Additional Follow-up for Phone Call Additional follow up Details #2::    Is asking about flying on two trips. Follow-up by: Lynann Beaver CMA AAMA,  November 26, 2009 3:55 PM  Additional Follow-up for Phone Call Additional follow up Details #3:: Details for Additional Follow-up Action Taken: ok to fly...no restricitions Additional Follow-up by: Birdie Sons MD,  November 26, 2009 3:55 PM

## 2010-03-05 NOTE — Progress Notes (Signed)
Summary: pt wants cath tomorrow  Phone Note Call from Patient Call back at [phone]   Caller: pt Reason for Call: Talk to Nurse Summary of Call: pt would like to know what the chances are of him having a cath done 10/31/09. Initial call taken by: Faythe Ghee,  October 30, 2009 1:02 PM  Follow-up for Phone Call        I spoke with Leonard Edwards in the cath lab and rescheduled the pt's cath to 10/31/09 at 10:30, pt will arrive at 9:30.  Follow-up by: Julieta Gutting, RN, BSN,  October 30, 2009 1:18 PM  Additional Follow-up for Phone Call Additional follow up Details #1::        I spoke with the pt and he would like to proceed with cath on 10/31/09. Orders updated and precert aware. Additional Follow-up by: Julieta Gutting, RN, BSN,  October 30, 2009 2:37 PM

## 2010-03-05 NOTE — Progress Notes (Signed)
Summary: REQ FOR RETURN CALL  Phone Note Call from Patient   Caller: Patient   [phone] Summary of Call: Pt called to speak with Alvino Chapel, RN.... Pt adv that he has a question regarding prescription..... Pt can be reached at  [phone].  Initial call taken by: Debbra Riding,  October 02, 2009 3:45 PM  Follow-up for Phone Call        Checking to see if 10cc vial available at [facility].  Told yes.  He made appt for injection 8/31. Follow-up by: Gladis Riffle, RN,  October 02, 2009 3:55 PM

## 2010-03-05 NOTE — Progress Notes (Signed)
Summary: Elevated BP  Phone Note Call from Patient   Caller: Patient Summary of Call: The pt came into the office for carotid study.  The pt has been having problems with his BP running high.  I checked the pt's BP (left arm-large cuff) 158/100.  The pt has started monitoring his BP at home and his Lisinopril was increased to 20mg  on Monday.  The pt has gotten his GXT rescheduled to 10/29/09.  I instructed the pt to keep a record of his BP readings and bring them into his GXT appt.  I also instructed the pt to continue following up with his PCP about his BP readings if they remain elevated. Pt agreed with plan.  Initial call taken by: Julieta Gutting, RN, BSN,  October 17, 2009 11:52 AM

## 2010-03-05 NOTE — Consult Note (Signed)
Summary: ENT-Dr. Narda Bonds  ENT-Dr. Narda Bonds   Imported By: Maryln Gottron 11/29/2009 13:23:45  _____________________________________________________________________  External Attachment:    Type:   Image     Comment:   External Document

## 2010-03-05 NOTE — Letter (Signed)
Summary: [facility]  [facility]   Imported By: Maryln Gottron 01/08/2010 12:15:06  _____________________________________________________________________  External Attachment:    Type:   Image     Comment:   External Document

## 2010-03-05 NOTE — Assessment & Plan Note (Signed)
Summary: HIGH BP PMS/SW PT/PS   Vital Signs:  Patient profile:   55 year old male Height:      69.5 inches (176.53 cm) Weight:      237 pounds (107.73 kg) O2 Sat:      94 % on Room air Temp:     98.5 degrees F (36.94 degrees C) oral Pulse rate:   83 / minute BP sitting:   160 / 92  (left arm) Cuff size:   large  Vitals Entered By: Josph Macho RMA (October 24, 2009 3:54 PM)  O2 Flow:  Room air  Serial Vital Signs/Assessments:  Time      Position  BP       Pulse  Resp  Temp     By           R Arm     150/92                         Danise Edge MD           L Arm     148/98                         Danise Edge MD  CC: High Blood Pressure- light headed (not as bad as yesterday)/ CF Is Patient Diabetic? No   History of Present Illness: patient is a 55 year old Caucasian male in today for evaluation of persistently elevated blood pressures, sinusitis and a lightheaded feeling. He called late yesterday reporting blood pressures in the 180s over 120s and a sensation of lightheadedness. Due to the late hour he was instructed to procedure urgent care or emergency room setting for further evaluation to his symptoms. He chose not to do so and instead is here today for reevaluation. He did notice his sinus symptoms are somewhat worse increased facial pressure malaise as a result. He has seen ENT and they have not recommended any change in therapy. Recent CT results did show chronic sinusitis and he does acknowledge that lightheaded pressure feeling in his head could in fact be related to a sinusitis. He denies true vertiginous or spinning symptoms. No ear pain or there is some pressure. No fevers, chills, nausea, vomiting, chest pain, palpitations, shortness of breath. He does continue to engage in regular exercise activity he noticed his blood pressure today is somewhat better than it was yesterday his systolic numbers have been running in the 140s to 160s. He does note his blood pressures  tend to drop as low as 120s over 80s after exercise. He had been seen by cardiology in the past and has been seen here several times recently. He had been told by cardiology back in April that they would like to proceed with repeat stress testing but he never followed up on it so we help to facilitate this process and he has a stress test and evaluation scheduled for 926 2011 and he is going to proceed with this. He is concerned about his increased fatigue and is anxious about being able to fulfill his obligations both at work and at home do to his fatigue, hypertension, acute illness.  Current Medications (verified): 1)  Aspir-81 81 Mg Tbec (Aspirin) .... Take 1 Tablet By Mouth Once A Day 2)  Plavix 75 Mg Tabs (Clopidogrel Bisulfate) .... Take 1 Tablet By Mouth Once A Day 3)  Toprol Xl 50 Mg Tb24 (Metoprolol Succinate) .... Take 1 Tablet By Mouth Once  A Day 4)  Simvastatin 20 Mg Tabs (Simvastatin) .... Take One Tablet By Mouth Daily At Bedtime 5)  Allopurinol 300 Mg Tabs (Allopurinol) .... Take 1 Tab Every Day 6)  Coq10 100 Mg Caps (Coenzyme Q10) .... Take 1 Capsule By Mouth Once A Day 7)  Lovaza 1 Gm Caps (Omega-3-Acid Ethyl Esters) .... Take 1 Capsule By Mouth Once A Day 8)  Vitamin D3 1000 Unit Tabs (Cholecalciferol) .... Take 1 Tablet By Mouth Once A Day 9)  Omnaris 50 Mcg/act Susp (Ciclesonide) .Marland Kitchen.. 1 Puff Each Nostril Once Daily 10)  Testosterone Cypionate 200 Mg/ml Oil (Testosterone Cypionate) .Marland Kitchen.. 1 Cc Im Monthly 11)  Lisinopril 10 Mg Tabs (Lisinopril) .... Take 1 Tablet By Mouth Bid 12)  Cefdinir 300 Mg Caps (Cefdinir) .Marland Kitchen.. 1 Cap By Mouth Two Times A Day X 3 Weeks 13)  Cetirizine Hcl 10 Mg Tabs (Cetirizine Hcl) .Marland Kitchen.. 1 Tab By Mouth Once Daily 14)  Diphenhydramine Hcl 25 Mg Caps (Diphenhydramine Hcl) .Marland Kitchen.. 1-2 Tabs By Mouth At Bedtime 15)  Famotidine 20 Mg Tabs (Famotidine) .Marland Kitchen.. 1 Tab By Mouth Two Times A Day 16)  Align 4 Mg Caps (Probiotic Product) .Marland Kitchen.. 1 Cap By Mouth Qd 17)  Prinivil 20  Mg Tabs (Lisinopril) .Marland Kitchen.. 1 Tab By Mouth Two Times A Day  Allergies (verified): No Known Drug Allergies  Past History:  Past medical history reviewed for relevance to current acute and chronic problems. Social history (including risk factors) reviewed for relevance to current acute and chronic problems.  Past Medical History: Reviewed history from 10/02/2009 and no changes required. Current Problems:  CORONARY ARTERY DISEASE (ICD-414.00)- Drug eluting stent in the distal right coronary artery 2006.  HYPERTENSION (ICD-401.9) HYPERLIPIDEMIA (ICD-272.4) GERD (ICD-530.81) GASTRIC POLYP (ICD-211.1) HIATAL HERNIA (ICD-553.3) ESOPHAGEAL STRICTURE (ICD-530.3) GOUT (ICD-274.9) BENIGN PROSTATIC HYPERTROPHY (ICD-600.00)  Social History: Reviewed history from 05/01/2009 and no changes required. 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 No apparent alopecia or balding. Eyes:  No corneal or conjunctival inflammation noted. EOMI.  Ears:  B/L TMs mildly dull and erythematous Nose:  Nasal mucosa boggy and erythematous Mouth:  Oral mucosa and oropharynx without lesions or exudates.  Teeth in good repair. Neck:  No deformities, masses, or tenderness noted. Lungs:  Normal respiratory effort, chest expands symmetrically. Lungs are clear to auscultation, no crackles or wheezes. Heart:  Normal rate and regular rhythm. S1 and S2 normal without gallop, murmur, click, rub or other extra sounds. Abdomen:  Bowel sounds positive,abdomen soft and non-tender without masses, organomegaly or hernias noted. Extremities:  No clubbing, cyanosis, edema, or deformity noted with normal full range  of motion of all joints.   Psych:  Oriented X3, memory intact for recent and remote, normally interactive, good eye contact, and slightly anxious.     Impression & Recommendations:  Problem # 1:  HYPERTENSION (ICD-401.9)  His updated medication list for this problem includes:    Toprol Xl 50 Mg Tb24 (Metoprolol succinate) .Marland Kitchen... Take 1 tablet by mouth once a day    Lisinopril 10 Mg Tabs (Lisinopril) .Marland Kitchen... Take 1 tablet by mouth bid    Prinivil 20 Mg Tabs (Lisinopril) .Marland Kitchen... 1 tab by mouth two times a day    Hydrochlorothiazide 25 Mg Tabs (Hydrochlorothiazide) .Marland KitchenMarland KitchenMarland KitchenMarland Kitchen  1 tab by mouth daily  Orders: Radiology Referral (Radiology)Check a renal ultrasound to further evaluate and added HCTZ 25mg  by mouth once daily, given a handout of hi potasssium containing foods and will check a renal panel next week  Problem # 2:  SINUSITIS, CHRONIC (ICD-473.9)  His updated medication list for this problem includes:    Omnaris 50 Mcg/act Susp (Ciclesonide) .Marland Kitchen... 1 puff each nostril once daily    Cefdinir 300 Mg Caps (Cefdinir) .Marland Kitchen... 1 cap by mouth two times a day x 3 weeks Finish course of antibiotics,  maintain adequate fluid intake and take OTC antihistamine such as Zyrtec daily  Problem # 3:  CORONARY ARTERY DISEASE (ICD-414.00)  His updated medication list for this problem includes:    Aspir-81 81 Mg Tbec (Aspirin) .Marland Kitchen... Take 1 tablet by mouth once a day    Plavix 75 Mg Tabs (Clopidogrel bisulfate) .Marland Kitchen... Take 1 tablet by mouth once a day    Toprol Xl 50 Mg Tb24 (Metoprolol succinate) .Marland Kitchen... Take 1 tablet by mouth once a day    Lisinopril 10 Mg Tabs (Lisinopril) .Marland Kitchen... Take 1 tablet by mouth bid    Prinivil 20 Mg Tabs (Lisinopril) .Marland Kitchen... 1 tab by mouth two times a day    Hydrochlorothiazide 25 Mg Tabs (Hydrochlorothiazide) .Marland Kitchen... 1 tab by mouth daily Has stress test ordered for early next week, encouraged to proceed and seek care sooner if concerning symptoms develop  Problem # 4:  GERD  (ICD-530.81)  His updated medication list for this problem includes:    Famotidine 20 Mg Tabs (Famotidine) .Marland Kitchen... 1 tab by mouth two times a day He reports some recent improvement with change in diet and medications  Problem # 5:  FIBROMYALGIA (ICD-729.1)  His updated medication list for this problem includes:    Aspir-81 81 Mg Tbec (Aspirin) .Marland Kitchen... Take 1 tablet by mouth once a day Patient also excessively anxious regarding his HTN, illness and is having some trouble sleeping as well. Given a small amount of Diazepam 5mg  to use sparingly for pain/insomnia/anxiety and even bumps in BP. If no response seek further care  Complete Medication List: 1)  Aspir-81 81 Mg Tbec (Aspirin) .... Take 1 tablet by mouth once a day 2)  Plavix 75 Mg Tabs (Clopidogrel bisulfate) .... Take 1 tablet by mouth once a day 3)  Toprol Xl 50 Mg Tb24 (Metoprolol succinate) .... Take 1 tablet by mouth once a day 4)  Simvastatin 20 Mg Tabs (Simvastatin) .... Take one tablet by mouth daily at bedtime 5)  Allopurinol 300 Mg Tabs (Allopurinol) .... Take 1 tab every day 6)  Coq10 100 Mg Caps (Coenzyme q10) .... Take 1 capsule by mouth once a day 7)  Lovaza 1 Gm Caps (Omega-3-acid ethyl esters) .... Take 1 capsule by mouth once a day 8)  Vitamin D3 1000 Unit Tabs (Cholecalciferol) .... Take 1 tablet by mouth once a day 9)  Omnaris 50 Mcg/act Susp (Ciclesonide) .Marland Kitchen.. 1 puff each nostril once daily 10)  Testosterone Cypionate 200 Mg/ml Oil (Testosterone cypionate) .Marland Kitchen.. 1 cc im monthly 11)  Lisinopril 10 Mg Tabs (Lisinopril) .... Take 1 tablet by mouth bid 12)  Cefdinir 300 Mg Caps (Cefdinir) .Marland Kitchen.. 1 cap by mouth two times a day x 3 weeks 13)  Cetirizine Hcl 10 Mg Tabs (Cetirizine hcl) .Marland Kitchen.. 1 tab by mouth once daily 14)  Diphenhydramine Hcl 25 Mg Caps (Diphenhydramine hcl) .Marland Kitchen.. 1-2 tabs by mouth at bedtime 15)  Famotidine 20 Mg Tabs (Famotidine) .Marland KitchenMarland KitchenMarland Kitchen  1 tab by mouth two times a day 16)  Align 4 Mg Caps (Probiotic product) .Marland Kitchen.. 1  cap by mouth qd 17)  Prinivil 20 Mg Tabs (Lisinopril) .Marland Kitchen.. 1 tab by mouth two times a day 18)  Hydrochlorothiazide 25 Mg Tabs (Hydrochlorothiazide) .Marland Kitchen.. 1 tab by mouth daily 19)  Diazepam 5 Mg Tabs (Diazepam) .... 1/2 to 1 tab by mouth daily as needed pain/anxiety/insomnia  Patient Instructions: 1)  Please schedule a follow-up appointment in 1-2 weeks.  2)  Please schedule a follow-up appointment as needed if symptoms worsen or you have any concerns 3)  BMP prior to visit, ICD-9: 401.1, needs this in 1 week 4)  Magnesium level in 1 week 5)  Cont to avoid sodium, try to get 7-8 hours a night Prescriptions: DIAZEPAM 5 MG TABS (DIAZEPAM) 1/2 to 1 tab by mouth daily as needed pain/anxiety/insomnia  #20 x 0   Entered and Authorized by:   Danise Edge MD   Signed by:   Danise Edge MD on 10/24/2009   Method used:   Print then Give to Patient   RxID:   [id number] HYDROCHLOROTHIAZIDE 25 MG TABS (HYDROCHLOROTHIAZIDE) 1 tab by mouth daily  #30 x 1   Entered and Authorized by:   Danise Edge MD   Signed by:   Danise Edge MD on 10/24/2009   Method used:   Electronically to        [facility] (retail)       [address]       [place], [place]  [id number]       Ph: [phone] or [phone]       Fax: [phone]   RxID:   [id number]

## 2010-03-05 NOTE — Assessment & Plan Note (Signed)
Summary: CPX/CJR   Vital Signs:  Patient profile:   55 year old male Height:      69.5 inches Weight:      227 pounds BMI:     33.16 Temp:     98.7 degrees F oral Pulse rate:   58 / minute BP sitting:   148 / 106  (left arm) Cuff size:   regular  Vitals Entered By: Kathrynn Speed CMA (September 05, 2009 9:08 AM) CC: Cpx with lab review, inner ear problem, sinus, seeing ENT, src Is Patient Diabetic? No   Primary Care Provider:  Leeanne Deed  CC:  Cpx with lab review, inner ear problem, sinus, seeing ENT, and src.  History of Present Illness: CPX  Preventive Screening-Counseling & Management  Alcohol-Tobacco     Alcohol type: beer---weekends     Smoking Status: never  Current Medications (verified): 1)  Aspir-81 81 Mg Tbec (Aspirin) .... Take 1 Tablet By Mouth Once A Day 2)  Plavix 75 Mg Tabs (Clopidogrel Bisulfate) .... Take 1 Tablet By Mouth Once A Day 3)  Toprol Xl 50 Mg Tb24 (Metoprolol Succinate) .... Take 1 Tablet By Mouth Once A Day 4)  Simvastatin 20 Mg Tabs (Simvastatin) .... Take One Tablet By Mouth Daily At Bedtime 5)  Allopurinol 300 Mg Tabs (Allopurinol) .... Take 1 Tab Every Day 6)  Pepcid Ac 10 Mg Tabs (Famotidine) .... As Needed 7)  Coq10 100 Mg Caps (Coenzyme Q10) .... Take 1 Capsule By Mouth Once A Day 8)  Lovaza 1 Gm Caps (Omega-3-Acid Ethyl Esters) .... Take 1 Capsule By Mouth Once A Day 9)  Vitamin D3 1000 Unit Tabs (Cholecalciferol) .... Take 1 Tablet By Mouth Once A Day  Allergies (verified): No Known Drug Allergies  Past History:  Past Medical History: Last updated: 07/06/2009 Current Problems:  CORONARY ARTERY DISEASE (ICD-414.00)- Drug eluting stent in the distal right coronary artery 2006.  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   Head:  normocephalic and atraumatic.   Eyes:  pupils equal and pupils round.   Ears:  R ear normal and L ear normal.   Nose:  External nasal examination shows no deformity or inflammation. Nasal mucosa are pink and moist without lesions or exudates. Neck:  No deformities, masses, or tenderness noted. Chest Wall:  No deformities, masses, tenderness or gynecomastia noted. Lungs:  normal respiratory effort and no intercostal retractions.   Heart:  normal rate and regular rhythm.   Abdomen:  soft and non-tender.   Rectal:  no external abnormalities and normal sphincter tone.   Prostate:  no nodules and no asymmetry.   Msk:  No deformity or scoliosis  noted of thoracic or lumbar spine.   Pulses:  R radial normal and L radial normal.   Neurologic:  cranial nerves II-XII intact and gait normal.   Skin:  turgor normal and color normal.   Psych:  normally interactive and good eye contact.     Impression & Recommendations:  Problem # 1:  PREVENTIVE HEALTH CARE (ICD-V70.0)  Problem # 2:  CORONARY ARTERY DISEASE  (ICD-414.00)  His updated medication list for this problem includes:    Aspir-81 81 Mg Tbec (Aspirin) .Marland Kitchen... Take 1 tablet by mouth once a day    Plavix 75 Mg Tabs (Clopidogrel bisulfate) .Marland Kitchen... Take 1 tablet by mouth once a day    Toprol Xl 50 Mg Tb24 (Metoprolol succinate) .Marland Kitchen... Take 1 tablet by mouth once a day  Problem # 3:  HYPERTENSION (ICD-401.9)  His updated medication list for this problem includes:    Toprol Xl 50 Mg Tb24 (Metoprolol succinate) .Marland Kitchen... Take 1 tablet by mouth once a day  BP today: 148/106 Prior BP: 132/68 (07/06/2009)  Labs Reviewed: K+: 4.6 (08/29/2009) Creat: : 0.8 (08/29/2009)   Chol: 167 (08/29/2009)   HDL: 35.80 (08/29/2009)   LDL: 102 (07/13/2008)   TG: 210.0 (08/29/2009)  Problem # 4:  HYPERLIPIDEMIA (ICD-272.4) reasonable control His updated medication list for this problem includes:    Simvastatin 20 Mg Tabs (Simvastatin) .Marland Kitchen... Take one tablet by mouth daily at bedtime    Lovaza 1 Gm Caps (Omega-3-acid ethyl esters) .Marland Kitchen... Take 1 capsule by mouth once a day  Labs Reviewed: SGOT: 30 (08/29/2009)   SGPT: 29 (08/29/2009)   HDL:35.80 (08/29/2009), 37.60 (07/13/2008)  LDL:102 (07/13/2008), 94 (16/11/9602)  Chol:167 (08/29/2009), 162 (07/13/2008)  Trig:210.0 (08/29/2009), 113.0 (07/13/2008) has had some uri sxs and some ear congestion---followed by ENT---has had several courses of antibiotics---he can't remember the names  Problem # 6:  ERECTILE DYSFUNCTION (ICD-302.72) Assessment: New  discussed check testosterone  Orders: Venipuncture (54098) TLB-Testosterone, Total ([id number]-TESTO)  Complete Medication List: 1)  Aspir-81 81 Mg Tbec (Aspirin) .... Take 1 tablet by mouth once a day 2)  Plavix 75 Mg Tabs (Clopidogrel bisulfate) .... Take 1 tablet by mouth once a day 3)  Toprol Xl 50 Mg Tb24 (Metoprolol succinate) .... Take 1 tablet by mouth once a day 4)  Simvastatin 20 Mg Tabs (Simvastatin) .... Take one tablet by mouth daily at bedtime 5)   Allopurinol 300 Mg Tabs (Allopurinol) .... Take 1 tab every day 6)  Pepcid Ac 10 Mg Tabs (Famotidine) .... As needed 7)  Coq10 100 Mg Caps (Coenzyme q10) .... Take 1 capsule by mouth once a day 8)  Lovaza 1 Gm Caps (Omega-3-acid ethyl esters) .... Take 1 capsule by mouth once a day 9)  Vitamin D3 1000 Unit Tabs (Cholecalciferol) .... Take 1 tablet by mouth once a day 10)  Omnaris 50 Mcg/act Susp (Ciclesonide) .Marland Kitchen.. 1 puff each nostril once daily  Patient Instructions: 1)  Please schedule a follow-up appointment in 6 months. 2)  lipids 272.4 3)  liver 995.2 4)  Schedule appt for mole removal---liquid NTG  Appended Document: Orders Update    Clinical Lists Changes  Orders: Added new Service order of Specimen Handling ([id number]) - Signed

## 2010-03-05 NOTE — Cardiovascular Report (Signed)
Summary: Pre-Cath Orders  Pre-Cath Orders   Imported By: Marylou Mccoy 11/09/2009 10:40:32  _____________________________________________________________________  External Attachment:    Type:   Image     Comment:   External Document

## 2010-03-05 NOTE — Assessment & Plan Note (Signed)
Summary: 1 wk rov/njr   Vital Signs:  Patient profile:   55 year old male Height:      69.5 inches (176.53 cm) Weight:      238 pounds (108.18 kg) O2 Sat:      99 % on Room air Temp:     98.4 degrees F (36.89 degrees C) oral Pulse rate:   63 / minute BP sitting:   164 / 102  (left arm) Cuff size:   large  Vitals Entered By: Josph Macho RMA (October 22, 2009 9:05 AM)  O2 Flow:  Room air CC: 1 week follow up/ CF Is Patient Diabetic? No   History of Present Illness: patient is a 55 year old Caucasian male in today for followup on multiple medical problems. 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 He is tolerating this afternoon and is taking a probiotic with the same time. He has seen ENT whom he reports agrees with the course of treatment but does not eat he needs any further more aggressive therapy. He is taking cetirizine and famotidine over-the-counter as recommended. He continues to have persistent heartburn. He has gone back to his exercise regimen going to the gym and working on the treadmill without any difficulty. No worsening shortness of breath, chest pain, palpitations. No recent fevers, chills, cough, congestion, GU complaints. No anorexia, nausea, vomiting, abdominal pain, diarrhea, constipation.he had his blood pressure checked at his cardiologist's office last week they maintain his medications. He is scheduled for stress test on 926 with Dr. Excell Seltzer.  Current Medications (verified): 1)  Aspir-81 81 Mg Tbec (Aspirin) .... Take 1 Tablet By Mouth Once A Day 2)  Plavix 75 Mg Tabs (Clopidogrel Bisulfate) .... Take 1 Tablet By Mouth Once A Day 3)  Toprol Xl 50 Mg Tb24 (Metoprolol Succinate) .... Take 1 Tablet By Mouth Once A Day 4)  Simvastatin 20 Mg Tabs (Simvastatin) .... Take One Tablet By Mouth Daily At Bedtime 5)  Allopurinol 300 Mg Tabs (Allopurinol) .... Take 1 Tab Every Day 6)  Coq10 100 Mg Caps (Coenzyme Q10) .... Take 1 Capsule By  Mouth Once A Day 7)  Lovaza 1 Gm Caps (Omega-3-Acid Ethyl Esters) .... Take 1 Capsule By Mouth Once A Day 8)  Vitamin D3 1000 Unit Tabs (Cholecalciferol) .... Take 1 Tablet By Mouth Once A Day 9)  Omnaris 50 Mcg/act Susp (Ciclesonide) .Marland Kitchen.. 1 Puff Each Nostril Once Daily 10)  Testosterone Cypionate 200 Mg/ml Oil (Testosterone Cypionate) .Marland Kitchen.. 1 Cc Im Monthly 11)  Lisinopril 10 Mg Tabs (Lisinopril) .... Take 1 Tablet By Mouth Bid 12)  Cefdinir 300 Mg Caps (Cefdinir) .Marland Kitchen.. 1 Cap By Mouth Two Times A Day X 3 Weeks 13)  Cetirizine Hcl 10 Mg Tabs (Cetirizine Hcl) .Marland Kitchen.. 1 Tab By Mouth Once Daily 14)  Diphenhydramine Hcl 25 Mg Caps (Diphenhydramine Hcl) .Marland Kitchen.. 1-2 Tabs By Mouth At Bedtime 15)  Famotidine 20 Mg Tabs (Famotidine) .Marland Kitchen.. 1 Tab By Mouth Two Times A Day 16)  Align 4 Mg Caps (Probiotic Product) .Marland Kitchen.. 1 Cap By Mouth Qd  Allergies (verified): No Known Drug Allergies  Past History:  Past medical history reviewed for relevance to current acute and chronic problems. Social history (including risk factors) reviewed for relevance to current acute and chronic problems.  Past Medical History: Reviewed history from 10/02/2009 and no changes required. Current Problems:  CORONARY ARTERY DISEASE (ICD-414.00)- Drug eluting stent in the distal right coronary artery 2006.  HYPERTENSION (ICD-401.9) HYPERLIPIDEMIA (ICD-272.4) GERD (ICD-530.81) GASTRIC POLYP (  ICD-211.1) HIATAL HERNIA (ICD-553.3) ESOPHAGEAL STRICTURE (ICD-530.3) GOUT (ICD-274.9) BENIGN PROSTATIC HYPERTROPHY (ICD-600.00)  Social History: Reviewed history from 05/01/2009 and no changes required. 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 No apparent alopecia or balding. Mouth:  Oral mucosa and oropharynx without lesions or exudates.  Teeth in good repair. Neck:  No deformities, masses, or tenderness noted. Lungs:  Normal respiratory effort, chest expands symmetrically. Lungs are clear to auscultation, no crackles or wheezes. Heart:  Normal rate and regular rhythm. S1 and S2 normal without gallop, murmur, click, rub or other extra sounds. Abdomen:  Bowel sounds positive,abdomen soft and non-tender without masses, organomegaly or hernias noted. Extremities:  No clubbing, cyanosis, edema, or deformity noted  Psych:  Cognition and judgment appear intact. Alert and cooperative with normal attention span and concentration. No apparent delusions, illusions, hallucinations. slightly anxious to get to work   Impression & Recommendations:  Problem # 1:  HYPERTENSION (ICD-401.9)  His updated medication list for this problem includes:    Toprol Xl 50 Mg Tb24 (Metoprolol succinate) .Marland Kitchen... Take 1 tablet by mouth once a day    Lisinopril 10 Mg Tabs (Lisinopril) .Marland Kitchen... Take 1 tablet by mouth bid    Prinivil 20 Mg Tabs (Lisinopril) .Marland Kitchen... 1 tab by mouth two times a day Lisinopril increased to two times a day and will bp check by Cardiology next week and keep appt with PMD next month. If BP is elevated will return. Avoid sodium  Problem # 2:  SINUSITIS, CHRONIC (ICD-473.9)  His updated medication list for this problem includes:    Omnaris 50 Mcg/act Susp (Ciclesonide) .Marland Kitchen... 1 puff each nostril once daily    Cefdinir 300 Mg Caps (Cefdinir) .Marland Kitchen... 1 cap by mouth two times a day x 3 weeks Cetirizine prn  Problem # 3:  GERD (ICD-530.81)  His updated medication list for this problem includes:    Famotidine 20 Mg Tabs (Famotidine) .Marland Kitchen... 1 tab by mouth two times a day Avoid offending foods  Complete Medication List: 1)  Aspir-81 81 Mg Tbec (Aspirin) .... Take 1 tablet by mouth once a day 2)  Plavix 75 Mg Tabs  (Clopidogrel bisulfate) .... Take 1 tablet by mouth once a day 3)  Toprol Xl 50 Mg Tb24 (Metoprolol succinate) .... Take 1 tablet by mouth once a day 4)  Simvastatin 20 Mg Tabs (Simvastatin) .... Take one tablet by mouth daily at bedtime 5)  Allopurinol 300 Mg Tabs (Allopurinol) .... Take 1 tab every day 6)  Coq10 100 Mg Caps (Coenzyme q10) .... Take 1 capsule by mouth once a day 7)  Lovaza 1 Gm Caps (Omega-3-acid ethyl esters) .... Take 1 capsule by mouth once a day 8)  Vitamin D3 1000 Unit Tabs (Cholecalciferol) .... Take 1 tablet by mouth once a day 9)  Omnaris 50 Mcg/act Susp (Ciclesonide) .Marland Kitchen.. 1 puff each nostril once daily 10)  Testosterone Cypionate 200 Mg/ml Oil (Testosterone cypionate) .Marland Kitchen.. 1 cc im monthly 11)  Lisinopril 10 Mg Tabs (Lisinopril) .... Take 1 tablet by mouth bid 12)  Cefdinir 300 Mg Caps (Cefdinir) .Marland Kitchen.. 1 cap by mouth two times a day x 3 weeks 13)  Cetirizine Hcl 10 Mg Tabs (Cetirizine hcl) .Marland Kitchen.. 1 tab by mouth once daily  14)  Diphenhydramine Hcl 25 Mg Caps (Diphenhydramine hcl) .Marland Kitchen.. 1-2 tabs by mouth at bedtime 15)  Famotidine 20 Mg Tabs (Famotidine) .Marland Kitchen.. 1 tab by mouth two times a day 16)  Align 4 Mg Caps (Probiotic product) .Marland Kitchen.. 1 cap by mouth qd 17)  Prinivil 20 Mg Tabs (Lisinopril) .Marland Kitchen.. 1 tab by mouth two times a day  Patient Instructions: 1)  Limit your Sodium(salt) .  2)  It is important that you exercise reguarly at least 20 minutes 5 times a week. If you develop chest pain, have severe difficulty breathing, or feel very tired, stop exercising immediately and seek medical attention.  3)  You need to lose weight. Consider a lower calorie diet and regular exercise. Continue your exercise regimen but not to excess, seek immediate medical care if symptoms worsen, including SOB/chest pain/fatigue 4)  Take your antibiotic as prescribed until ALL of it is gone, but stop if you develop a rash or swelling and contact our office as soon as possible.  5)  Acute sinusitis  symptoms for less than 10 days are not helped by antibiotics. Use warm moist compresses, and over the counter decongestants( only as directed). Call if no improvement in 5-7 days, sooner if increasing pain, fever, or new symptoms.  6)  Avoid foods high in acid(tomatoes, citrus juices,spicy foods).Avoid eating within two hours of lying down or before exercising. Do not over eat: try smaller more frequent meals. Elevate head of bed twelve inches when sleeping.  Prescriptions: PRINIVIL 20 MG TABS (LISINOPRIL) 1 tab by mouth two times a day  #60 x 2   Entered and Authorized by:   Danise Edge MD   Signed by:   Danise Edge MD on 10/22/2009   Method used:   Electronically to        [facility] (retail)       [address]       [place], [place]  [id number]       Ph: [phone] or [phone]       Fax: [phone]   RxID:   [id number]

## 2010-03-05 NOTE — Miscellaneous (Signed)
Summary: update med  Clinical Lists Changes  Medications: Changed medication from TOPROL XL 50 MG TB24 (METOPROLOL SUCCINATE) Take 1 tablet by mouth twice a day to TOPROL XL 50 MG TB24 (METOPROLOL SUCCINATE) Take 1 tablet by mouth once a day

## 2010-03-05 NOTE — Progress Notes (Signed)
Summary: question about ENT referral  Phone Note Call from Patient Call back at Home Phone [phone]   Caller: Patient Call For: swords Summary of Call: wants to know if you have talked to Dr Ezzard Standing yet.  Dr Kelli Churn does not do sinus surgery anymore so he cancelled appt.  He is going on an airplane next Wednesday. Initial call taken by: Alfred Levins, CMA,  November 27, 2009 9:42 AM  Follow-up for Phone Call        see previous phone notes referred to dr Jac Canavan at pt's request Follow-up by: Birdie Sons MD,  November 27, 2009 11:53 AM  Additional Follow-up for Phone Call Additional follow up Details #1::        no answer, can't leave a message on machine Additional Follow-up by: Alfred Levins, CMA,  November 27, 2009 3:20 PM    Additional Follow-up for Phone Call Additional follow up Details #2::    pt aware of appt at [facility] Follow-up by: Alfred Levins, CMA,  November 28, 2009 8:29 AM

## 2010-03-05 NOTE — Letter (Signed)
Summary: Cardiac Catheterization Instructions- [facility]  [facility], Main Office  [address]   [place], [place] [address]   Phone: [phone]  Fax: [phone]     10/29/2009 MRN: [id number]  FREELAND PRACHT [address] [place], [place]  [address]  Dear Mr. LOTT,   You are scheduled for a Cardiac Catheterization on Friday November 02, 2009 with Dr. Excell Seltzer.  Please arrive to the 1st floor of the [facility] V[facility] at [facility] at 6:30 am on the day of your procedure. Please do not arrive before 6:30 a.m. Call the Heart and Vascular Center at [phone] if you are unable to make your appointmnet. The Code to get into the parking garage under the building is 0020. Take the elevators to the 1st floor. You must have someone to drive you home. Someone must be with you for the first 24 hours after you arrive home. Please wear clothes that are easy to get on and off and wear slip-on shoes. Do not eat or drink after midnight except water with your medications that morning. Bring all your medications and current insurance cards with you.  _X__ Make sure you take your aspirin and plavix.  _X__ You may take ALL of your medications with water that morning.   The usual length of stay after your procedure is 2 to 3 hours. This can vary.  If you have any questions, please call the office at the number listed above.   Julieta Gutting, RN, BSN

## 2010-03-05 NOTE — Assessment & Plan Note (Signed)
Summary: testosterone inj/et  Nurse Visit   Allergies: No Known Drug Allergies  Medication Administration  Injection # 1:    Medication: Testosterone Cypionat 200mg  ing    Diagnosis: TESTICULAR HYPOFUNCTION (ICD-257.2)    Route: IM    Site: RUOQ gluteus    Exp Date: 03/07/2011    Lot #: [id number]    Mfr: paddock    Patient tolerated injection without complications    Given by: Gladis Riffle, RN (October 03, 2009 2:16 PM)  Orders Added: 1)  Admin of patients own med IM/SQ [[id number]]   Medication Administration  Injection # 1:    Medication: Testosterone Cypionat 200mg  ing    Diagnosis: TESTICULAR HYPOFUNCTION (ICD-257.2)    Route: IM    Site: RUOQ gluteus    Exp Date: 03/07/2011    Lot #: [id number]    Mfr: paddock    Patient tolerated injection without complications    Given by: Gladis Riffle, RN (October 03, 2009 2:16 PM)  Orders Added: 1)  Admin of patients own med IM/SQ [phone]

## 2010-03-05 NOTE — Assessment & Plan Note (Signed)
Summary: mole removal -liquid ntg/njr--DISCUSS TESTOSTERONE REPLACEMEN...   Vital Signs:  Patient profile:   55 year old male Pulse rate:   58 / minute Pulse rhythm:   regular Resp:     12 per minute BP sitting:   156 / 94  (left arm) Cuff size:   regular  Vitals Entered By: Gladis Riffle, RN (October 02, 2009 8:15 AM) CC: mole removal with liquid nitrogen and discuss testosterone Is Patient Diabetic? No   Primary Care Zimal Weisensel:  Leeanne Deed  CC:  mole removal with liquid nitrogen and discuss testosterone.  History of Present Illness: patient has been evaluated for testosterone deficiency. No underlying cause identified. He is here to discuss testosterone replacement and have liquid nitrogen applied to a mall. He has no complaints other than those that have been documented previously.  Preventive Screening-Counseling & Management  Alcohol-Tobacco     Alcohol type: beer---weekends     Smoking Status: never  Current Medications (verified): 1)  Aspir-81 81 Mg Tbec (Aspirin) .... Take 1 Tablet By Mouth Once A Day 2)  Plavix 75 Mg Tabs (Clopidogrel Bisulfate) .... Take 1 Tablet By Mouth Once A Day 3)  Toprol Xl 50 Mg Tb24 (Metoprolol Succinate) .... Take 1 Tablet By Mouth Once A Day 4)  Simvastatin 20 Mg Tabs (Simvastatin) .... Take One Tablet By Mouth Daily At Bedtime 5)  Allopurinol 300 Mg Tabs (Allopurinol) .... Take 1 Tab Every Day 6)  Pepcid Ac 10 Mg Tabs (Famotidine) .... As Needed 7)  Coq10 100 Mg Caps (Coenzyme Q10) .... Take 1 Capsule By Mouth Once A Day 8)  Lovaza 1 Gm Caps (Omega-3-Acid Ethyl Esters) .... Take 1 Capsule By Mouth Once A Day 9)  Vitamin D3 1000 Unit Tabs (Cholecalciferol) .... Take 1 Tablet By Mouth Once A Day 10)  Omnaris 50 Mcg/act Susp (Ciclesonide) .Marland Kitchen.. 1 Puff Each Nostril Once Daily  Allergies (verified): No Known Drug Allergies  Past History:  Past Surgical History: Last updated: 05/01/2009  Drug eluting stent in the distal right coronary  artery.  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  HYPERTENSION (ICD-401.9) HYPERLIPIDEMIA (ICD-272.4) GERD (ICD-530.81) GASTRIC POLYP (ICD-211.1) HIATAL HERNIA (ICD-553.3) ESOPHAGEAL STRICTURE (ICD-530.3) GOUT (ICD-274.9) BENIGN PROSTATIC HYPERTROPHY (ICD-600.00)  Physical Exam  General:  alert and well-developed.   Head:  normocephalic and atraumatic.   Eyes:  pupils equal and pupils round.   Ears:  R ear normal and L ear normal.   Neck:  No deformities, masses, or tenderness noted. Chest Wall:  No deformities, masses, tenderness or gynecomastia noted. Lungs:  normal respiratory effort and no intercostal retractions.   Heart:  normal rate and regular rhythm.   Abdomen:  soft and non-tender.   Msk:  No deformity or scoliosis noted of thoracic or lumbar spine.   Neurologic:  cranial nerves II-XII intact and gait normal.     Impression & Recommendations:  Problem # 1:  TESTICULAR HYPOFUNCTION (ICD-257.2) discussed at length. Different things discussed. We've agreed on testosterone injections. Side effects discussed. Risks of aggressive prostate cancer in the light of prostate cancer discussed. he agrees  to all risks.  Problem # 2:  NEOPLASM OF UNCERTAIN BEHAVIOR OF SKIN (ICD-238.2)  liquid nitrogen applied to erythematous, scaly mole on left leg. This was performed after risks and benefits discussed.  Orders: Cryotherapy/Destruction benign or premalignant lesion (1st lesion)   ([address])  Complete Medication List: 1)  Aspir-81 81 Mg Tbec (Aspirin) .... Take 1 tablet by mouth once a day 2)  Plavix 75 Mg Tabs (Clopidogrel bisulfate) .... Take 1 tablet by mouth once a day 3)  Toprol Xl 50 Mg Tb24 (Metoprolol succinate) .... Take 1 tablet by mouth once a day 4)  Simvastatin 20 Mg Tabs (Simvastatin) .... Take one tablet by mouth daily at bedtime 5)  Allopurinol 300 Mg Tabs (Allopurinol) .... Take 1 tab every day 6)  Pepcid Ac 10 Mg Tabs (Famotidine) .... As needed 7)  Coq10 100 Mg Caps (Coenzyme q10) .... Take 1 capsule by mouth once a day 8)  Lovaza 1 Gm Caps (Omega-3-acid ethyl esters) .... Take 1 capsule by mouth once a day 9)  Vitamin D3 1000 Unit Tabs (Cholecalciferol) .... Take 1 tablet by mouth once a day 10)  Omnaris 50 Mcg/act Susp (Ciclesonide) .Marland Kitchen.. 1 puff each nostril once daily 11)  Testosterone Cypionate 200 Mg/ml Oil (Testosterone cypionate) .Marland Kitchen.. 1 cc im monthly  Patient Instructions: 1)  Please schedule a follow-up appointment in 2 months. Prescriptions: OMNARIS 50 MCG/ACT SUSP (CICLESONIDE) 1 puff each nostril once daily  #1 x 11   Entered and Authorized by:   Birdie Sons MD   Signed by:   Birdie Sons MD on 10/02/2009   Method used:   Electronically to        [facility] (retail)       [address]       [place], [place]  [address]       Ph: [phone] or [phone]       Fax: [phone]   RxID:   [id number] TESTOSTERONE CYPIONATE 200 MG/ML OIL (TESTOSTERONE CYPIONATE) 1 cc im monthly  #10cc vial x 0   Entered and Authorized by:   Birdie Sons MD   Signed by:   Birdie Sons MD on 10/02/2009   Method used:   Print then Give to Patient   RxID:   [id number]

## 2010-03-05 NOTE — Letter (Signed)
Summary: Appointment - Missed  [facility], Main Office  [address]   [place], [place] [address]   Phone: [phone]  Fax: [phone]     October 22, 2009 MRN: [id number]   SARP VERNIER [address] [place], [place]  [address]   Dear Mr. Mounts,  Our records indicate you missed your appointment on 09/13/2009 at 10:30am                        with Dr. Excell Seltzer. It is very important that we reach you to reschedule this appointment. We look forward to participating in your health care needs. Please contact us at the number listed above at your earliest convenience to reschedule this appointment.     Sincerely, Neurosurgeon Team LG

## 2010-03-05 NOTE — Miscellaneous (Signed)
Summary: [facility] Physcian Order/Treatment Plan   [facility] Phys[facility] Order/Treatment Plan   Imported By: Roderic Ovens 11/21/2009 15:39:11  _____________________________________________________________________  External Attachment:    Type:   Image     Comment:   External Document

## 2010-03-05 NOTE — Assessment & Plan Note (Signed)
Summary: EAR DISCOMFORT // RS   Vital Signs:  Patient profile:   55 year old male Height:      69.5 inches (176.53 cm) Weight:      240 pounds (109.09 kg) O2 Sat:      98 % on Room air Temp:     98.7 degrees F (37.06 degrees C) oral Pulse rate:   63 / minute BP sitting:   142 / 82  (left arm) Cuff size:   large  Vitals Entered By: Josph Macho RMA (December 05, 2009 8:35 AM)  O2 Flow:  Room air  Serial Vital Signs/Assessments:  Time      Position  BP       Pulse  Resp  Temp     By                     124/82                         Danise Edge MD  CC: Ear discomfort in both ears- throat starting to hurt- prone to strep/ CF Is Patient Diabetic? No   Current Medications (verified): 1)  Aspirin 325 Mg Tabs (Aspirin) .Marland Kitchen.. 1 Daily 2)  Plavix 75 Mg Tabs (Clopidogrel Bisulfate) .... Take 1 Tablet By Mouth Once A Day 3)  Toprol Xl 50 Mg Tb24 (Metoprolol Succinate) .... Take 1 Tablet By Mouth Once A Day 4)  Simvastatin 20 Mg Tabs (Simvastatin) .... Take One Tablet By Mouth Daily At Bedtime 5)  Allopurinol 300 Mg Tabs (Allopurinol) .... Take 1 Tab Every Day 6)  Coq10 100 Mg Caps (Coenzyme Q10) .... Take 1 Capsule By Mouth Once A Day 7)  Lovaza 1 Gm Caps (Omega-3-Acid Ethyl Esters) .... Take 1 Capsule By Mouth Once A Day 8)  Vitamin D3 1000 Unit Tabs (Cholecalciferol) .... Take 1 Tablet By Mouth Once A Day 9)  Omnaris 50 Mcg/act Susp (Ciclesonide) .Marland Kitchen.. 1 Puff Each Nostril Once Daily 10)  Testosterone Cypionate 200 Mg/ml Oil (Testosterone Cypionate) .Marland Kitchen.. 1 Cc Im Monthly 11)  Cefdinir 300 Mg Caps (Cefdinir) .Marland Kitchen.. 1 Cap By Mouth Two Times A Day X 3 Weeks 12)  Cetirizine Hcl 10 Mg Tabs (Cetirizine Hcl) .Marland Kitchen.. 1 Tab By Mouth Once Daily 13)  Diphenhydramine Hcl 25 Mg Caps (Diphenhydramine Hcl) .Marland Kitchen.. 1-2 Tabs By Mouth At Bedtime 14)  Famotidine 20 Mg Tabs (Famotidine) .Marland Kitchen.. 1 Tab By Mouth Two Times A Day 15)  Align 4 Mg Caps (Probiotic Product) .Marland Kitchen.. 1 Cap By Mouth Qd 16)  Lisinopril 20 Mg Tabs  (Lisinopril) .... Take One Tablet By Mouth Daily 17)  Hydrochlorothiazide 25 Mg Tabs (Hydrochlorothiazide) .Marland Kitchen.. 1 Tab By Mouth Daily 18)  Diazepam 5 Mg Tabs (Diazepam) .... 1/2 To 1 Tab By Mouth Daily As Needed Pain/anxiety/insomnia 19)  Vosol Hc 2-1 % Soln (Hydrocortisone-Acetic Acid) .... 5 Drops in Right Ear Three Times A Day X 7days As Needed Pain/itching/irritation  Allergies (verified): No Known Drug Allergies  Past History:  Past medical history reviewed for relevance to current acute and chronic problems. Social history (including risk factors) reviewed for relevance to current acute and chronic problems.  Past Medical History: Reviewed history from 10/02/2009 and no changes required. Current Problems:  CORONARY ARTERY DISEASE (ICD-414.00)- Drug eluting stent in the distal right coronary artery 2006.  HYPERTENSION (ICD-401.9) HYPERLIPIDEMIA (ICD-272.4) GERD (ICD-530.81) GASTRIC POLYP (ICD-211.1) HIATAL HERNIA (ICD-553.3) ESOPHAGEAL STRICTURE (ICD-530.3) GOUT (ICD-274.9) BENIGN PROSTATIC HYPERTROPHY (ICD-600.00)  Social  History: Reviewed history from 05/01/2009 and no changes required. 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 No apparent alopecia or balding. Mouth:  Oral mucosa and oropharynx without lesions or exudates.  Teeth in good repair. Neck:  No deformities, masses, or tenderness noted. Lungs:  Normal respiratory effort, chest expands symmetrically. Lungs are clear to auscultation, no crackles or wheezes. Heart:  Normal rate and regular rhythm. S1 and S2 normal without gallop, murmur, click, rub or other extra sounds. Abdomen:  Bowel sounds  positive,abdomen soft and non-tender without masses, organomegaly or hernias noted. Extremities:  No clubbing, cyanosis, edema, or deformity noted with normal full range of motion of all joints.   Psych:  Cognition and judgment appear intact. Alert and cooperative with normal attention span and concentration. No apparent delusions, illusions, hallucinations   Impression & Recommendations:  Problem # 1:  SINUSITIS, CHRONIC (ICD-473.9)  The following medications were removed from the medication list:    Cefdinir 300 Mg Caps (Cefdinir) .Marland Kitchen... 1 cap by mouth two times a day x 3 weeks His updated medication list for this problem includes:    Omnaris 50 Mcg/act Susp (Ciclesonide) .Marland Kitchen... 1 puff each nostril once daily    Cipro 500 Mg Tabs (Ciprofloxacin hcl) .Marland Kitchen... 1 tab by mouth two times a day x 14 days    Ayr Saline Nasal Drops 0.65 % Soln (Saline) ..... Use as needed    Mucinex 600 Mg Xr12h-tab (Guaifenesin) .Marland Kitchen... 1 tab by mouth two times a day x 14 days Push clear fluids, may use Aleve while traveling, report if symptoms do not improve. Has appt later this  month with ENT  Problem # 2:  CORONARY ARTERY DISEASE (ICD-414.00)  His updated medication list for this problem includes:    Aspirin 325 Mg Tabs (Aspirin) .Marland Kitchen... 1 daily    Plavix 75 Mg Tabs (Clopidogrel bisulfate) .Marland Kitchen... Take 1 tablet by mouth once a day    Toprol Xl 50 Mg Tb24 (Metoprolol succinate) .Marland Kitchen... Take 1 tablet by mouth once a day    Lisinopril 20 Mg Tabs (Lisinopril) .Marland Kitchen... Take one tablet by mouth daily    Hydrochlorothiazide 25 Mg Tabs (Hydrochlorothiazide) .Marland Kitchen... 1 tab by mouth daily Feeling much better s/p stenting  Problem # 3:  HYPERTENSION (ICD-401.9)  His updated medication list for this problem includes:    Toprol Xl 50 Mg Tb24 (Metoprolol succinate) .Marland Kitchen... Take 1 tablet by mouth once a day    Lisinopril 20 Mg Tabs (Lisinopril) .Marland Kitchen... Take one tablet by mouth daily    Hydrochlorothiazide 25 Mg Tabs (Hydrochlorothiazide)  .Marland Kitchen... 1 tab by mouth daily Well controlled today  Complete Medication List: 1)  Aspirin 325 Mg Tabs (Aspirin) .Marland Kitchen.. 1 daily 2)  Plavix 75 Mg Tabs (Clopidogrel bisulfate) .... Take 1 tablet by mouth once a day 3)  Toprol Xl 50 Mg Tb24 (Metoprolol succinate) .... Take 1 tablet by mouth once a day 4)  Simvastatin 20 Mg Tabs (Simvastatin) .... Take one tablet by mouth daily at bedtime 5)  Allopurinol 300 Mg Tabs (Allopurinol) .... Take 1 tab every day 6)  Coq10 100 Mg Caps (Coenzyme q10) .... Take 1 capsule by mouth once a day 7)  Lovaza 1 Gm Caps (Omega-3-acid ethyl esters) .Marland KitchenMarland KitchenMarland Kitchen  Take 1 capsule by mouth once a day 8)  Vitamin D3 1000 Unit Tabs (Cholecalciferol) .... Take 1 tablet by mouth once a day 9)  Omnaris 50 Mcg/act Susp (Ciclesonide) .Marland Kitchen.. 1 puff each nostril once daily 10)  Testosterone Cypionate 200 Mg/ml Oil (Testosterone cypionate) .Marland Kitchen.. 1 cc im monthly 11)  Cetirizine Hcl 10 Mg Tabs (Cetirizine hcl) .Marland Kitchen.. 1 tab by mouth once two times a day 12)  Diphenhydramine Hcl 25 Mg Caps (Diphenhydramine hcl) .Marland Kitchen.. 1-2 tabs by mouth at bedtime 13)  Famotidine 20 Mg Tabs (Famotidine) .Marland Kitchen.. 1 tab by mouth two times a day 14)  Align 4 Mg Caps (Probiotic product) .Marland Kitchen.. 1 cap by mouth qd 15)  Lisinopril 20 Mg Tabs (Lisinopril) .... Take one tablet by mouth daily 16)  Hydrochlorothiazide 25 Mg Tabs (Hydrochlorothiazide) .Marland Kitchen.. 1 tab by mouth daily 17)  Diazepam 5 Mg Tabs (Diazepam) .... 1/2 to 1 tab by mouth daily as needed pain/anxiety/insomnia 18)  Vosol Hc 2-1 % Soln (Hydrocortisone-acetic acid) .... 5 drops in right ear three times a day x 7days as needed pain/itching/irritation 19)  Cipro 500 Mg Tabs (Ciprofloxacin hcl) .Marland Kitchen.. 1 tab by mouth two times a day x 14 days 20)  Ayr Saline Nasal Drops 0.65 % Soln (Saline) .... Use as needed 21)  Mucinex 600 Mg Xr12h-tab (Guaifenesin) .Marland Kitchen.. 1 tab by mouth two times a day x 14 days  Patient Instructions: 1)  Take your antibiotic as prescribed until ALL of it is  gone, but stop if you develop a rash or swelling and contact our office as soon as possible.  2)  Acute sinusitis symptoms for less than 10 days are not helped by antibiotics. Use warm moist compresses, and over the counter decongestants( only as directed). Call if no improvement in 5-7 days, sooner if increasing pain, fever, or new symptoms.  3)  Consider Aleve 220mg  1 tab by mouth two times a day as needed painand while traveling, may take 1 spray of Afrin in each nostril 1 hour prior to plane flight Prescriptions: CIPRO 500 MG TABS (CIPROFLOXACIN HCL) 1 tab by mouth two times a day x 14 days  #28 x 0   Entered and Authorized by:   Danise Edge MD   Signed by:   Danise Edge MD on 12/05/2009   Method used:   Electronically to        [facility] (retail)       [address]       [place], [place]  [id number]       Ph: [phone] or [phone]       Fax: [phone]   RxID:   [id number]    Orders Added: 1)  Est. Patient Level IV [74259]  Appended Document: EAR DISCOMFORT // RS     History of Present Illness: 74-year-old Caucasian male in today with concerns regarding worsening ear pain and fevers. Patient with a well-documented long history of chronic sinusitis and intermittent trouble with his ears. Is presently at a point where he is having difficulty hearing out of either ear and has some pressure in his ears. Over the last 48 hours has developed low-grade fevers and flushing. He is noting some increased swelling and discomfort on his tongue some increased sore throat and very mild cough at this time. Jugular with nasal congestion difficulty breathing out of both sides of his nose and some green rhinorrhea is now being produced. He denies any chest discomfort, wheezing, shortness of breath, palpitations, GI  or GU complaints at this time. Is scheduled to have an ENT consultation later this month but feels he is worsening at this point and needs further treatment. He did  have some old clindamycin at home and took 2 doses last night and said that made him feel somewhat better this morning.  Allergies: No Known Drug Allergies   Complete Medication List: 1)  Aspirin 325 Mg Tabs (Aspirin) .Marland Kitchen.. 1 daily 2)  Plavix 75 Mg Tabs (Clopidogrel bisulfate) .... Take 1 tablet by mouth once a day 3)  Toprol Xl 50 Mg Tb24 (Metoprolol succinate) .... Take 1 tablet by mouth once a day 4)  Simvastatin 20 Mg Tabs (Simvastatin) .... Take one tablet by mouth daily at bedtime 5)  Allopurinol 300 Mg Tabs (Allopurinol) .... Take 1 tab every day 6)  Coq10 100 Mg Caps (Coenzyme q10) .... Take 1 capsule by mouth once a day 7)  Lovaza 1 Gm Caps (Omega-3-acid ethyl esters) .... Take 1 capsule by mouth once a day 8)  Vitamin D3 1000 Unit Tabs (Cholecalciferol) .... Take 1 tablet by mouth once a day 9)  Omnaris 50 Mcg/act Susp (Ciclesonide) .Marland Kitchen.. 1 puff each nostril once daily 10)  Testosterone Cypionate 200 Mg/ml Oil (Testosterone cypionate) .Marland Kitchen.. 1 cc im monthly 11)  Cetirizine Hcl 10 Mg Tabs (Cetirizine hcl) .Marland Kitchen.. 1 tab by mouth once two times a day 12)  Diphenhydramine Hcl 25 Mg Caps (Diphenhydramine hcl) .Marland Kitchen.. 1-2 tabs by mouth at bedtime 13)  Famotidine 20 Mg Tabs (Famotidine) .Marland Kitchen.. 1 tab by mouth two times a day 14)  Align 4 Mg Caps (Probiotic product) .Marland Kitchen.. 1 cap by mouth qd 15)  Lisinopril 20 Mg Tabs (Lisinopril) .... Take one tablet by mouth daily 16)  Hydrochlorothiazide 25 Mg Tabs (Hydrochlorothiazide) .Marland Kitchen.. 1 tab by mouth daily 17)  Diazepam 5 Mg Tabs (Diazepam) .... 1/2 to 1 tab by mouth daily as needed pain/anxiety/insomnia 18)  Vosol Hc 2-1 % Soln (Hydrocortisone-acetic acid) .... 5 drops in right ear three times a day x 7days as needed pain/itching/irritation 19)  Cipro 500 Mg Tabs (Ciprofloxacin hcl) .Marland Kitchen.. 1 tab by mouth two times a day x 14 days 20)  Ayr Saline Nasal Drops 0.65 % Soln (Saline) .... Use as needed 21)  Mucinex 600 Mg Xr12h-tab (Guaifenesin) .Marland Kitchen.. 1 tab by mouth  two times a day x 14 days

## 2010-03-05 NOTE — Assessment & Plan Note (Signed)
Summary: ec6/resch/jss  Medications Added SIMVASTATIN 20 MG TABS (SIMVASTATIN) Take one tablet by mouth daily at bedtime PEPCID AC 10 MG TABS (FAMOTIDINE) as needed COQ10 100 MG CAPS (COENZYME Q10) Take 1 capsule by mouth once a day LOVAZA 1 GM CAPS (OMEGA-3-ACID ETHYL ESTERS) Take 1 capsule by mouth once a day VITAMIN D3 1000 UNIT TABS (CHOLECALCIFEROL) Take 1 tablet by mouth once a day        Visit Type:  Follow-up Primary Provider:  Leeanne Deed  CC:  Patient saw Dr. Samule Ohm before.  History of Present Illness: 55 year-old male presenting for follow-up evaluation of CAD. He has been followed by Dr Samule Ohm in the past, but hasn't been seen here since 2008. The patient developed unstable angina in 2006 and was treated with a DES in the right coronary artery. He had residual disease in a diagonal branch that has been managed medically.  At the time of his initial presentation, he developed exertional chest tightness, dyspnea, and fatigue. Since PCI, he has done very well. He denies exertional symptoms at present. He is active and exercises regularly. He states he can exercise for over 60 minutes without symptoms.  He denies CP, dyspnea, edema, or lightheadedness. Has not  had any bleeding problems on ASA/plavix.    Current Medications (verified): 1)  Aspir-81 81 Mg Tbec (Aspirin) .... Take 1 Tablet By Mouth Once A Day 2)  Plavix 75 Mg Tabs (Clopidogrel Bisulfate) .... Take 1 Tablet By Mouth Once A Day 3)  Toprol Xl 50 Mg Tb24 (Metoprolol Succinate) .... Take 1 Tablet By Mouth Once A Day 4)  Simvastatin 20 Mg Tabs (Simvastatin) .... Take One Tablet By Mouth Daily At Bedtime 5)  Allopurinol 300 Mg Tabs (Allopurinol) .... Take 1 Tab Every Day 6)  Pepcid Ac 10 Mg Tabs (Famotidine) .... As Needed 7)  Coq10 100 Mg Caps (Coenzyme Q10) .... Take 1 Capsule By Mouth Once A Day 8)  Lovaza 1 Gm Caps (Omega-3-Acid Ethyl Esters) .... Take 1 Capsule By Mouth Once A Day 9)  Vitamin D3 1000 Unit  Tabs (Cholecalciferol) .... Take 1 Tablet By Mouth Once A Day  Allergies: 1)  ! Simvastatin  Past History:  Past medical history reviewed for relevance to current acute and chronic problems.  Past Medical History: Current Problems:  CORONARY ARTERY DISEASE (ICD-414.00)- Drug eluting stent in the distal right coronary artery 2006.  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) GERD (ICD-530.81) FAMILY HISTORY DIABETES 1ST DEGREE RELATIVE (ICD-V18.0) SCREENING COLORECTAL-CANCER (ICD-V76.51) GASTRIC POLYP (ICD-211.1) HIATAL HERNIA (ICD-553.3) ESOPHAGEAL STRICTURE (ICD-530.3) GOUT (ICD-274.9) SPECIAL SCREENING FOR MALIGNANT NEOPLASMS COLON (ICD-V76.51) PREVENTIVE HEALTH CARE (ICD-V70.0) ABDOMINAL PAIN (ICD-789.00) FIBROMYALGIA (ICD-729.1) BENIGN PROSTATIC HYPERTROPHY (ICD-600.00)  Review of Systems       Negative except as per HPI   Vital Signs:  Patient profile:   55 year old male Height:      69 inches Weight:      230 pounds BMI:     34.09 Pulse rate:   70 / minute Pulse rhythm:   regular Resp:     18 per minute BP sitting:   132 / 68  (left arm) Cuff size:   large  Vitals Entered By: Vikki Ports (July 06, 2009 11:25 AM)  Physical Exam  General:  Pt is alert and oriented, in no acute distress. HEENT: normal Neck: normal carotid upstrokes without bruits, JVP normal Lungs: CTA CV: RRR without murmur or gallop Abd: soft, NT, positive BS, no bruit, no  organomegaly Ext: no clubbing, cyanosis, or edema. peripheral pulses 2+ and equal Skin: warm and dry without rash    EKG  Procedure date:  07/06/2009  Findings:      NSR, within normal limits, HR 70 bpm.  Impression & Recommendations:  Problem # 1:  CORONARY ARTERY DISEASE (ICD-414.00) Pt stable without angina. He is active and exercises regularly. We discussed risk/benefit of long-term dual antiplatelet Rx and this favors remaining on both drugs for now. His overall  bleeding risk is low. He is on appropriate risk-reduction meds.  Since he is 5 years out from his PCI procedure, recommend an exercise treadmill stress and this will be scheduled in the near future.  His updated medication list for this problem includes:    Aspir-81 81 Mg Tbec (Aspirin) .Marland Kitchen... Take 1 tablet by mouth once a day    Plavix 75 Mg Tabs (Clopidogrel bisulfate) .Marland Kitchen... Take 1 tablet by mouth once a day    Toprol Xl 50 Mg Tb24 (Metoprolol succinate) .Marland Kitchen... Take 1 tablet by mouth once a day  Orders: EKG w/ Interpretation (93000) Treadmill (Treadmill)  Problem # 2:  HYPERTENSION (ICD-401.9) Controlled. Discussed importance of weight loss/salt restriction.  His updated medication list for this problem includes:    Aspir-81 81 Mg Tbec (Aspirin) .Marland Kitchen... Take 1 tablet by mouth once a day    Toprol Xl 50 Mg Tb24 (Metoprolol succinate) .Marland Kitchen... Take 1 tablet by mouth once a day  BP today: 132/68 Prior BP: 140/90 (02/15/2009)  Labs Reviewed: K+: 4.5 (07/13/2008) Creat: : 0.8 (07/13/2008)   Chol: 162 (07/13/2008)   HDL: 37.60 (07/13/2008)   LDL: 102 (07/13/2008)   TG: 113.0 (07/13/2008)  Problem # 3:  HYPERLIPIDEMIA (ICD-272.4) He had myalgias in the past, but they did not improve off of statin Rx. He is now back on simvastatin. Continue current Rx. Goal LDL less than 100 mg/dL.  His updated medication list for this problem includes:    Simvastatin 20 Mg Tabs (Simvastatin) .Marland Kitchen... Take one tablet by mouth daily at bedtime    Lovaza 1 Gm Caps (Omega-3-acid ethyl esters) .Marland Kitchen... Take 1 capsule by mouth once a day  CHOL: 162 (07/13/2008)   LDL: 102 (07/13/2008)   HDL: 37.60 (07/13/2008)   TG: 113.0 (07/13/2008)  Patient Instructions: 1)  Your physician recommends that you schedule a follow-up appointment in: 1 year with Dr. Excell Seltzer 2)  Your physician recommends that you continue on your current medications as directed. Please refer to the Current Medication list given to you today. 3)  Your  physician has requested that you have an exercise tolerance test.  For further information please visit [email or web address]  Please also follow instruction sheet, as given.

## 2010-03-05 NOTE — Progress Notes (Signed)
Summary:  High BP  Phone Note Call from Patient   Summary of Call: Patient called stating that BP is 188/129 earlier it was 184/104. Pt states he is light headed. Patient would like to know what he should do? Please advise? Initial call taken by: Josph Macho RMA,  October 23, 2009 3:43 PM  Follow-up for Phone Call        He should go get evaluated at an urgent care or the ER since he is symptomatic Follow-up by: Danise Edge MD,  October 23, 2009 4:50 PM  Additional Follow-up for Phone Call Additional follow up Details #1::        Patient states he is going to call Dr Marina Goodell and discuss with. Additional Follow-up by: Josph Macho RMA,  October 23, 2009 4:56 PM    Additional Follow-up for Phone Call Additional follow up Details #2:: Follow-up by: Danise Edge MD,  October 23, 2009 5:04 PM

## 2010-03-05 NOTE — Miscellaneous (Signed)
  Clinical Lists Changes  Observations: Added new observation of CARDCATHFIND:  1. Severe stenosis of the ramus intermedius with successful       percutaneous intervention using a single drug-eluting stent.   2. Nonobstructive stenosis in the left anterior descending.   3. Patency of the right coronary artery stent with mild diffuse in-       stent restenosis.   4. Preserved left ventricular systolic function.      RECOMMENDATIONS:  The patient should remain on dual antiplatelet therapy   with aspirin and Plavix for a minimum of 12 months.     (11/01/2009 15:09)      Cardiac Cath  Procedure date:  11/01/2009  Findings:       1. Severe stenosis of the ramus intermedius with successful       percutaneous intervention using a single drug-eluting stent.   2. Nonobstructive stenosis in the left anterior descending.   3. Patency of the right coronary artery stent with mild diffuse in-       stent restenosis.   4. Preserved left ventricular systolic function.      RECOMMENDATIONS:  The patient should remain on dual antiplatelet therapy   with aspirin and Plavix for a minimum of 12 months.

## 2010-03-05 NOTE — Assessment & Plan Note (Signed)
Summary: 2 month rov/njr/pt rsc/cjr   Vital Signs:  Patient profile:   55 year old male Temp:     97.4 degrees F oral Pulse rate:   78 / minute BP sitting:   160 / 98  Vitals Entered By: Lynann Beaver CMA (November 22, 2009 8:58 AM)  CC: rov Is Patient Diabetic? No Pain Assessment Patient in pain? no         Primary Care Greysen Swanton:  Leeanne Deed  CC:  rov.  History of Present Illness:  Follow-Up Visit      This is a 55 year old man who presents for Follow-up visit.  The patient denies chest pain and palpitations.  Since the last visit the patient notes no new problems or concerns, a recent hospitilization, and being seen by a specialist.  The patient reports taking meds as prescribed.  When questioned about possible medication side effects, the patient notes none.    reviewed notes from dr blythe and dr cooper. his main complaint is that his ears feel clogged and that he feels his head "vibrating" at night. He has seen Dr. Ezzard Standing.   He has had difficulty controlling BP---he has not been following a diet or exercise plan despite recent angiogram and stent placement.   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 1 Daily 2)  Plavix 75 Mg Tabs (Clopidogrel Bisulfate) .... Take 1 Tablet By Mouth Once A Day 3)  Toprol  Xl 50 Mg Tb24 (Metoprolol Succinate) .... Take 1 Tablet By Mouth Twice A Day 4)  Simvastatin 20 Mg Tabs (Simvastatin) .... Take One Tablet By Mouth Daily At Bedtime 5)  Allopurinol 300 Mg Tabs (Allopurinol) .... Take 1 Tab Every Day 6)  Coq10 100 Mg Caps (Coenzyme Q10) .... Take 1 Capsule By Mouth Once A Day 7)  Lovaza 1 Gm Caps (Omega-3-Acid Ethyl Esters) .... Take 1 Capsule By Mouth Once A Day 8)  Vitamin D3 1000 Unit Tabs (Cholecalciferol) .... Take 1 Tablet By Mouth Once A Day 9)  Omnaris 50 Mcg/act Susp (Ciclesonide) .Marland Kitchen.. 1 Puff Each Nostril Once Daily 10)  Testosterone Cypionate 200 Mg/ml Oil (Testosterone Cypionate) .Marland Kitchen.. 1 Cc Im Monthly 11)  Cefdinir 300 Mg Caps (Cefdinir) .Marland Kitchen.. 1 Cap By Mouth Two Times A Day X 3 Weeks 12)  Cetirizine Hcl 10 Mg Tabs (Cetirizine Hcl) .Marland Kitchen.. 1 Tab By Mouth Once Daily 13)  Diphenhydramine Hcl 25 Mg Caps (Diphenhydramine Hcl) .Marland Kitchen.. 1-2 Tabs By Mouth At Bedtime 14)  Famotidine 20 Mg Tabs (Famotidine) .Marland Kitchen.. 1 Tab By Mouth Two Times A Day 15)  Align 4 Mg Caps (Probiotic Product) .Marland Kitchen.. 1 Cap By Mouth Qd 16)  Lisinopril 20 Mg Tabs (Lisinopril) .... Take One Tablet By Mouth Daily 17)  Hydrochlorothiazide 25 Mg Tabs (Hydrochlorothiazide) .Marland Kitchen.. 1 Tab By Mouth Daily 18)  Diazepam 5 Mg Tabs (Diazepam) .Marland KitchenMarland KitchenMarland Kitchen  1/2 To 1 Tab By Mouth Daily As Needed Pain/anxiety/insomnia 19)  Vosol Hc 2-1 % Soln (Hydrocortisone-Acetic Acid) .... 5 Drops in Right Ear Three Times A Day X 7days As Needed Pain/itching/irritation  Allergies (verified): No Known Drug Allergies  Physical Exam  General:  alert and well-developed.   Head:  normocephalic and atraumatic.   Eyes:  pupils equal and pupils round.   Ears:  R ear normal and L ear normal.   Neck:  No deformities, masses, or tenderness noted. Chest Wall:  No deformities, masses, tenderness or gynecomastia noted. Lungs:  Normal respiratory effort, chest expands symmetrically. Lungs are clear to auscultation, no crackles or wheezes. Heart:   Normal rate and regular rhythm. S1 and S2 normal without gallop, murmur, click, rub or other extra sounds. Abdomen:  Bowel sounds positive,abdomen soft and non-tender without masses, organomegaly or hernias noted. Msk:  No deformity or scoliosis noted of thoracic or lumbar spine.   Neurologic:  cranial nerves II-XII intact and strength normal in all extremities.     Impression & Recommendations:  Problem # 1:  SINUSITIS, CHRONIC (ICD-473.9)  probably needs ent eval again his CT is consistent with chronic sinusitis and I suspect his chronic ear complaints  are related His updated medication list for this problem includes:    Omnaris 50 Mcg/act Susp (Ciclesonide) .Marland Kitchen... 1 puff each nostril once daily    Cefdinir 300 Mg Caps (Cefdinir) .Marland Kitchen... 1 cap by mouth two times a day x 3 weeks  Take antibiotics for full duration. Discussed treatment options including indications for coronal CT scan of sinuses and ENT referral.   Orders: ENT Referral (ENT)  Problem # 2:  CORONARY ARTERY DISEASE (ICD-414.00)  no sxs after stent placement His updated medication list for this problem includes:    Aspirin 325 Mg Tabs (Aspirin) .Marland Kitchen... 1 daily    Plavix 75 Mg Tabs (Clopidogrel bisulfate) .Marland Kitchen... Take 1 tablet by mouth once a day    Toprol Xl 50 Mg Tb24 (Metoprolol succinate) .Marland Kitchen... Take 1 tablet by mouth twice a day    Lisinopril 20 Mg Tabs (Lisinopril) .Marland Kitchen... Take one tablet by mouth daily    Hydrochlorothiazide 25 Mg Tabs (Hydrochlorothiazide) .Marland Kitchen... 1 tab by mouth daily  ETT Interpretation: abnormal-evidence of ST depression consistent with ischemia (10/29/2009)  ETT Comments: Good exercise capacity.  Marked hypertensive response to exercise.  Pt has 1-2 mm ST depression with peak exertion, resolves quickly in recovery. This was associated with mild left upper back pain. No significant arrhythmia. (10/29/2009)  Labs Reviewed: Chol: 167 (08/29/2009)   HDL: 35.80 (08/29/2009)   LDL: 102 (07/13/2008)   TG:  210.0 (08/29/2009)  Problem # 3:  HYPERTENSION (ICD-401.9) repeat bp 135/86 His updated medication list for this problem includes:    Toprol Xl 50 Mg Tb24 (Metoprolol succinate) .Marland Kitchen... Take 1 tablet by mouth twice a day    Lisinopril 20 Mg Tabs (Lisinopril) .Marland Kitchen... Take one tablet by mouth daily    Hydrochlorothiazide 25 Mg Tabs (Hydrochlorothiazide) .Marland Kitchen... 1 tab by mouth daily  BP today: 160/98 Prior BP: 108/76 (11/07/2009)  Prior 10 Yr Risk Heart Disease: N/A (10/29/2009)  Labs Reviewed: K+: 3.8 (10/29/2009) Creat: : 0.9 (10/29/2009)   Chol: 167 (08/29/2009)   HDL: 35.80 (08/29/2009)   LDL: 102 (07/13/2008)   TG: 210.0 (08/29/2009)  Complete Medication List: 1)  Aspirin 325 Mg Tabs (Aspirin) .Marland Kitchen.. 1 daily 2)  Plavix 75 Mg Tabs (Clopidogrel bisulfate) .... Take 1 tablet by mouth once a day 3)  Toprol Xl 50 Mg Tb24 (Metoprolol succinate) .... Take 1 tablet by mouth twice a day 4)  Simvastatin 20 Mg Tabs (Simvastatin) .... Take one tablet by mouth daily at bedtime 5)  Allopurinol 300 Mg Tabs (Allopurinol) .... Take 1 tab every day 6)  Coq10 100 Mg Caps (Coenzyme q10) .... Take 1 capsule by mouth once a day 7)  Lovaza 1 Gm Caps (Omega-3-acid ethyl esters) .... Take 1 capsule by mouth once a day 8)  Vitamin D3 1000 Unit Tabs (Cholecalciferol) .... Take 1 tablet by mouth once a day 9)  Omnaris 50 Mcg/act Susp (Ciclesonide) .Marland Kitchen.. 1 puff each nostril once daily 10)  Testosterone Cypionate 200 Mg/ml Oil (Testosterone cypionate) .Marland Kitchen.. 1 cc im monthly 11)  Cefdinir 300 Mg Caps (Cefdinir) .Marland Kitchen.. 1 cap by mouth two times a day x 3 weeks 12)  Cetirizine Hcl 10 Mg Tabs (Cetirizine hcl) .Marland Kitchen.. 1 tab by mouth once daily 13)  Diphenhydramine Hcl 25 Mg Caps (Diphenhydramine hcl) .Marland Kitchen.. 1-2 tabs by mouth at bedtime 14)  Famotidine 20 Mg Tabs (Famotidine) .Marland Kitchen.. 1 tab by mouth two times a day 15)  Align 4 Mg Caps (Probiotic product) .Marland Kitchen.. 1 cap by mouth qd 16)  Lisinopril 20 Mg Tabs (Lisinopril) .... Take one tablet  by mouth daily 17)  Hydrochlorothiazide 25 Mg Tabs (Hydrochlorothiazide) .Marland Kitchen.. 1 tab by mouth daily 18)  Diazepam 5 Mg Tabs (Diazepam) .... 1/2 to 1 tab by mouth daily as needed pain/anxiety/insomnia 19)  Vosol Hc 2-1 % Soln (Hydrocortisone-acetic acid) .... 5 drops in right ear three times a day x 7days as needed pain/itching/irritation  Patient Instructions: 1)  Please schedule a follow-up appointment in 2 months.   Orders Added: 1)  Est. Patient Level IV [29562] 2)  ENT Referral [ENT]

## 2010-03-05 NOTE — Letter (Signed)
Summary: Appointment - Missed   [facility]  [address]   [place], [place] [address]   Phone: [phone]  Fax: [phone]        September 18, 2009 MRN: [id number]   Leonard Edwards [address] [place], [place]  [address]   Dear [name],  Our records indicate you missed your appointment on 09/13/2009 @ 10am with Dr. Excell Seltzer for your treadmill test. It is very important that we reach you to reschedule this appointment. We look forward to participating in your health care needs. Please contact us at the number listed above at your earliest convenience to reschedule this appointment.     Sincerely, Lela Laverta [place] Scheduling Team

## 2010-03-05 NOTE — Letter (Signed)
Summary: [facility] Procedure Checklist  [facility] Procedure Checklist   Imported By: Marylou Mccoy 11/09/2009 10:46:48  _____________________________________________________________________  External Attachment:    Type:   Image     Comment:   External Document

## 2010-03-05 NOTE — Miscellaneous (Signed)
Summary: [facility] Progress Report   [facility] Progress Report   Imported By: Roderic Ovens 12/17/2009 14:41:33  _____________________________________________________________________  External Attachment:    Type:   Image     Comment:   External Document

## 2010-03-05 NOTE — Progress Notes (Signed)
Summary: refill   Phone Note From Pharmacy   Caller: [facility] Reason for Call: Needs renewal Details for Reason: Toprol xl Initial call taken by: Romualdo Bolk, CMA (AAMA),  October 25, 2009 12:55 PM    Prescriptions: TOPROL XL 50 MG TB24 (METOPROLOL SUCCINATE) Take 1 tablet by mouth once a day  #30 x 0   Entered by:   Romualdo Bolk, CMA (AAMA)   Authorized by:   Birdie Sons MD   Signed by:   Romualdo Bolk, CMA (AAMA) on 10/25/2009   Method used:   Electronically to        [facility] (retail)       [address]       [place], [place]  [id number]       Ph: [phone] or [phone]       Fax: [phone]   RxID:   [id number]

## 2010-03-05 NOTE — Assessment & Plan Note (Signed)
Summary: 2 wk rov/njr   Vital Signs:  Patient profile:   55 year old male Height:      69.5 inches (176.53 cm) Weight:      237 pounds (107.73 kg) O2 Sat:      98 % on Room air Temp:     98.2 degrees F (36.78 degrees C) oral Pulse rate:   65 / minute BP sitting:   108 / 76  (left arm) Cuff size:   large  Vitals Entered By: Josph Macho RMA (November 07, 2009 8:15 AM)  O2 Flow:  Room air CC: 2 week follow up/ pt states he had stint placed last week/ CF Is Patient Diabetic? No   History of Present Illness: Patient in today for follow up after his cardiac stenting. Overall he feels greatly improved. She did do cardiac stress testing it did show ischemia and he ended up having just one vessel stented. At this time he reports his energy level is improving his sense of malaise is improving. No significant chest pain since the cardiac stenting. No palpitations or shortness of breath. His nausea and GI upset had Re: begun to improve and now have completely resolved. He continues to have some trouble with his sinuses but that is improving greatly as well no pressure in the head fevers, chills, rhinorrhea. He still has a strange sensation in his right ear at times says it's a strange fullness. He's had trouble off and on in childhood with this. He describes a sensation of it making him feel tremulous all over. The ear can feel full and even itchy at times. No discharge or sharp pain. He did have a bad dog bite over his right temple and ear as a child and he feels he has had pain since then. No f/c/GI or GU c/o.  Current Medications (verified): 1)  Aspirin 325 Mg Tabs (Aspirin) .Marland Kitchen.. 1 Daily 2)  Plavix 75 Mg Tabs (Clopidogrel Bisulfate) .... Take 1 Tablet By Mouth Once A Day 3)  Toprol Xl 50 Mg Tb24 (Metoprolol Succinate) .... Take 1 Tablet By Mouth Twice A Day 4)  Simvastatin 20 Mg Tabs (Simvastatin) .... Take One Tablet By Mouth Daily At Bedtime 5)  Allopurinol 300 Mg Tabs (Allopurinol) ....  Take 1 Tab Every Day 6)  Coq10 100 Mg Caps (Coenzyme Q10) .... Take 1 Capsule By Mouth Once A Day 7)  Lovaza 1 Gm Caps (Omega-3-Acid Ethyl Esters) .... Take 1 Capsule By Mouth Once A Day 8)  Vitamin D3 1000 Unit Tabs (Cholecalciferol) .... Take 1 Tablet By Mouth Once A Day 9)  Omnaris 50 Mcg/act Susp (Ciclesonide) .Marland Kitchen.. 1 Puff Each Nostril Once Daily 10)  Testosterone Cypionate 200 Mg/ml Oil (Testosterone Cypionate) .Marland Kitchen.. 1 Cc Im Monthly 11)  Cefdinir 300 Mg Caps (Cefdinir) .Marland Kitchen.. 1 Cap By Mouth Two Times A Day X 3 Weeks 12)  Cetirizine Hcl 10 Mg Tabs (Cetirizine Hcl) .Marland Kitchen.. 1 Tab By Mouth Once Daily 13)  Diphenhydramine Hcl 25 Mg Caps (Diphenhydramine Hcl) .Marland Kitchen.. 1-2 Tabs By Mouth At Bedtime 14)  Famotidine 20 Mg Tabs (Famotidine) .Marland Kitchen.. 1 Tab By Mouth Two Times A Day 15)  Align 4 Mg Caps (Probiotic Product) .Marland Kitchen.. 1 Cap By Mouth Qd 16)  Lisinopril 20 Mg Tabs (Lisinopril) .... Take One Tablet By Mouth Daily 17)  Hydrochlorothiazide 25 Mg Tabs (Hydrochlorothiazide) .Marland Kitchen.. 1 Tab By Mouth Daily 18)  Diazepam 5 Mg Tabs (Diazepam) .... 1/2 To 1 Tab By Mouth Daily As Needed Pain/anxiety/insomnia  Allergies (  verified): No Known Drug Allergies  Past History:  Past medical history reviewed for relevance to current acute and chronic problems. Social history (including risk factors) reviewed for relevance to current acute and chronic problems.  Past Medical History: Reviewed history from 10/02/2009 and no changes required. Current Problems:  CORONARY ARTERY DISEASE (ICD-414.00)- Drug eluting stent in the distal right coronary artery 2006.  HYPERTENSION (ICD-401.9) HYPERLIPIDEMIA (ICD-272.4) GERD (ICD-530.81) GASTRIC POLYP (ICD-211.1) HIATAL HERNIA (ICD-553.3) ESOPHAGEAL STRICTURE (ICD-530.3) GOUT (ICD-274.9) BENIGN PROSTATIC HYPERTROPHY (ICD-600.00)  Social History: Reviewed history from 05/01/2009 and no changes required. 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 No apparent alopecia or balding. Ears:  L ear normal.  right ear canal skin is mildly edematous and erythematous. Mouth:  Oral mucosa and oropharynx without lesions or exudates.  Neck:  No deformities, masses, or tenderness noted. Lungs:  Normal respiratory effort, chest expands symmetrically. Lungs are clear to auscultation, no crackles or wheezes. Heart:  Normal rate and regular rhythm. S1 and S2 normal without gallop, murmur, click, rub or other extra sounds. Abdomen:  Bowel sounds positive,abdomen soft and non-tender without masses, organomegaly or hernias noted. Extremities:  No clubbing, cyanosis, edema, or deformity noted  Psych:  Cognition and judgment appear intact. Alert and cooperative with normal attention span and concentration. No apparent delusions, illusions, hallucinations   Impression & Recommendations:  Problem # 1:  CORONARY ARTERY DISEASE (ICD-414.00)  His updated medication list for this problem includes:    Aspirin 325 Mg Tabs (Aspirin) .Marland Kitchen... 1 daily    Plavix 75 Mg Tabs (Clopidogrel bisulfate) .Marland Kitchen... Take 1 tablet by mouth once a day    Toprol Xl 50 Mg Tb24 (Metoprolol succinate) .Marland Kitchen... Take 1 tablet by mouth twice a day    Lisinopril 20 Mg Tabs (Lisinopril) .Marland Kitchen... Take one tablet by mouth daily    Hydrochlorothiazide 25 Mg Tabs (Hydrochlorothiazide) .Marland Kitchen... 1 tab by mouth daily Improved BP control. No changes today.  Problem # 2:  SINUSITIS, CHRONIC (ICD-473.9)  His updated medication list for this problem includes:    Omnaris 50 Mcg/act Susp (Ciclesonide) .Marland Kitchen... 1 puff each nostril once daily    Cefdinir 300 Mg Caps (Cefdinir) .Marland Kitchen... 1 cap by mouth two times a day x 3 weeks Improving  finish course of antibiotics  Problem # 3:  OTITIS EXTERNA, ACUTE, RIGHT (ICD-380.12)  His updated medication list for this problem includes:    Vosol Hc 2-1 % Soln (Hydrocortisone-acetic acid) .Marland KitchenMarland KitchenMarland KitchenMarland Kitchen 5 drops in right ear three times a day x 7days as needed pain/itching/irritation Call if symptoms worsen  Problem # 4:  HYPERLIPIDEMIA (ICD-272.4)  His updated medication list for this problem includes:    Simvastatin 20 Mg Tabs (Simvastatin) .Marland Kitchen... Take one tablet by mouth daily at bedtime    Lovaza 1 Gm Caps (Omega-3-acid ethyl esters) .Marland Kitchen... Take 1 capsule by mouth once a day Avoid trans fats and recheck FLP in roughly 3 months  Complete Medication List: 1)  Aspirin 325 Mg Tabs (Aspirin) .Marland Kitchen.. 1 daily 2)  Plavix 75 Mg Tabs (Clopidogrel bisulfate) .... Take 1 tablet by mouth once a day 3)  Toprol Xl 50 Mg Tb24 (Metoprolol succinate) .... Take 1 tablet by mouth twice a day 4)  Simvastatin 20 Mg  Tabs (Simvastatin) .... Take one tablet by mouth daily at bedtime 5)  Allopurinol 300 Mg Tabs (Allopurinol) .... Take 1 tab every day 6)  Coq10 100 Mg Caps (Coenzyme q10) .... Take 1 capsule by mouth once a day 7)  Lovaza 1 Gm Caps (Omega-3-acid ethyl esters) .... Take 1 capsule by mouth once a day 8)  Vitamin D3 1000 Unit Tabs (Cholecalciferol) .... Take 1 tablet by mouth once a day 9)  Omnaris 50 Mcg/act Susp (Ciclesonide) .Marland Kitchen.. 1 puff each nostril once daily 10)  Testosterone Cypionate 200 Mg/ml Oil (Testosterone cypionate) .Marland Kitchen.. 1 cc im monthly 11)  Cefdinir 300 Mg Caps (Cefdinir) .Marland Kitchen.. 1 cap by mouth two times a day x 3 weeks 12)  Cetirizine Hcl 10 Mg Tabs (Cetirizine hcl) .Marland Kitchen.. 1 tab by mouth once daily 13)  Diphenhydramine Hcl 25 Mg Caps (Diphenhydramine hcl) .Marland Kitchen.. 1-2 tabs by mouth at bedtime 14)  Famotidine 20 Mg Tabs (Famotidine) .Marland Kitchen.. 1 tab by mouth two times a day 15)  Align 4 Mg Caps (Probiotic product) .Marland Kitchen.. 1 cap by mouth qd 16)  Lisinopril 20 Mg Tabs (Lisinopril) .... Take one tablet by mouth  daily 17)  Hydrochlorothiazide 25 Mg Tabs (Hydrochlorothiazide) .Marland Kitchen.. 1 tab by mouth daily 18)  Diazepam 5 Mg Tabs (Diazepam) .... 1/2 to 1 tab by mouth daily as needed pain/anxiety/insomnia 19)  Vosol Hc 2-1 % Soln (Hydrocortisone-acetic acid) .... 5 drops in right ear three times a day x 7days as needed pain/itching/irritation  Patient Instructions: 1)  Please schedule a follow-up appointment in 3 months .  2)  BMP prior to visit, ICD-9: 401.1 3)  Hepatic Panel prior to visit ICD-9: 401.1 4)  Lipid panel prior to visit ICD-9 : 272.4 5)  TSH prior to visit ICD-9 : 401.1 6)  CBC w/ Diff prior to visit ICD-9 : 401.1 7)  PMD: [facility] Prescriptions: VOSOL HC 2-1 % SOLN (HYDROCORTISONE-ACETIC ACID) 5 drops in right ear three times a day x 7days as needed pain/itching/irritation  #1 bottle x 1   Entered and Authorized by:   Danise Edge MD   Signed by:   Danise Edge MD on 11/07/2009   Method used:   Electronically to        [facility] (retail)       [address]       [place], [place]  [id number]       Ph: [phone] or [phone]       Fax: [phone]   RxID:   [id number]   Preventive Care Screening  Last Flu Shot:    Date:  11/01/2009    Results:  historical

## 2010-03-05 NOTE — Assessment & Plan Note (Signed)
Summary: DIZZINESS // RS   Vital Signs:  Patient profile:   55 year old male Height:      69.5 inches (176.53 cm) Weight:      237.50 pounds (107.95 kg) BMI:     34.69 Temp:     98.0 degrees F (36.67 degrees C) oral BP sitting:   160 / 110  (left arm) Cuff size:   large  Vitals Entered By: Lucious Groves CMA (October 10, 2009 10:16 AM) CC: C/O dizziness and ear issues x2-3 weeks./kb Is Patient Diabetic? No Pain Assessment Patient in pain? no      Comments Patient notes that he has congestion and maybe this is due to inner ear issues, but denies HA and fever. Per patient, no refills needed at this time./kb   Primary Care Provider:  Leeanne Deed  CC:  C/O dizziness and ear issues x2-3 weeks./kb.  History of Present Illness: reports 3 month hx of ear trouble---saw ENT x 2---no findings. he then reports lifetime hx of dizziness---"since a kid"  more recent hx---since the spring he feels like the whole right side of his body vibrates (months) even more recent hx---Sunday developed increasing congestion, fatigue, right ear pressure. ENT treated him x 2 with ABX---initial relief  Long term sxs resolved with exercise  Also complains of neck stiffness x 3 days  All other systems reviewed and were negative   Current Medications (verified): 1)  Aspir-81 81 Mg Tbec (Aspirin) .... Take 1 Tablet By Mouth Once A Day 2)  Plavix 75 Mg Tabs (Clopidogrel Bisulfate) .... Take 1 Tablet By Mouth Once A Day 3)  Toprol Xl 50 Mg Tb24 (Metoprolol Succinate) .... Take 1 Tablet By Mouth Once A Day 4)  Simvastatin 20 Mg Tabs (Simvastatin) .... Take One Tablet By Mouth Daily At Bedtime 5)  Allopurinol 300 Mg Tabs (Allopurinol) .... Take 1 Tab Every Day 6)  Pepcid Ac 10 Mg Tabs (Famotidine) .... As Needed 7)  Coq10 100 Mg Caps (Coenzyme Q10) .... Take 1 Capsule By Mouth Once A Day 8)  Lovaza 1 Gm Caps (Omega-3-Acid Ethyl Esters) .... Take 1 Capsule By Mouth Once A Day 9)  Vitamin D3 1000 Unit  Tabs (Cholecalciferol) .... Take 1 Tablet By Mouth Once A Day 10)  Omnaris 50 Mcg/act Susp (Ciclesonide) .Marland Kitchen.. 1 Puff Each Nostril Once Daily 11)  Testosterone Cypionate 200 Mg/ml Oil (Testosterone Cypionate) .Marland Kitchen.. 1 Cc Im Monthly  Allergies (verified): No Known Drug Allergies  Past History:  Past Medical History: Last updated: 10/02/2009 Current Problems:  CORONARY ARTERY DISEASE (ICD-414.00)- Drug eluting stent in the distal right coronary artery 2006.  HYPERTENSION (ICD-401.9) HYPERLIPIDEMIA (ICD-272.4) GERD (ICD-530.81) GASTRIC POLYP (ICD-211.1) HIATAL HERNIA (ICD-553.3) ESOPHAGEAL STRICTURE (ICD-530.3) GOUT (ICD-274.9) BENIGN PROSTATIC HYPERTROPHY (ICD-600.00)  Past Surgical History: Last updated: 05/01/2009  Drug eluting stent in the distal right coronary artery.  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   Head:  normocephalic and atraumatic.   Eyes:  pupils equal and pupils round.  no nystagmus Neck:  No deformities, masses, or tenderness noted. Lungs:  normal respiratory effort and no intercostal retractions.   Heart:  normal rate and regular rhythm.  Abdomen:  soft and non-tender.   Msk:  No deformity or scoliosis noted of thoracic or lumbar spine.   Neurologic:  cranial nerves II-XII intact and gait normal.     Impression & Recommendations:  Problem # 1:  HYPERTENSION (ICD-401.9) Assessment Deteriorated  he has not taken meds needs additional meds see list side effects discussed His updated medication list for this problem includes:    Toprol Xl 50  Mg Tb24 (Metoprolol succinate) .Marland Kitchen... Take 1 tablet by mouth once a day    Lisinopril 10 Mg Tabs (Lisinopril) .Marland Kitchen... Take 1 tablet by mouth once a day  BP today: 160/110 Prior BP: 156/94 (10/02/2009)  Labs Reviewed: K+: 4.6 (08/29/2009) Creat: : 0.8 (08/29/2009)   Chol: 167 (08/29/2009)   HDL: 35.80 (08/29/2009)   LDL: 102 (07/13/2008)   TG: 210.0 (08/29/2009) he has innumerable nonspecific complaints---very difficult for him to explain. Perhaps all related to htn---sill continue to follow  Complete Medication List: 1)  Aspir-81 81 Mg Tbec (Aspirin) .... Take 1 tablet by mouth once a day 2)  Plavix 75 Mg Tabs (Clopidogrel bisulfate) .... Take 1 tablet by mouth once a day 3)  Toprol Xl 50 Mg Tb24 (Metoprolol succinate) .... Take 1 tablet by mouth once a day 4)  Simvastatin 20 Mg Tabs (Simvastatin) .... Take one tablet by mouth daily at bedtime 5)  Allopurinol 300 Mg Tabs (Allopurinol) .... Take 1 tab every day 6)  Pepcid Ac 10 Mg Tabs (Famotidine) .... As needed 7)  Coq10 100 Mg Caps (Coenzyme q10) .... Take 1 capsule by mouth once a day 8)  Lovaza 1 Gm Caps (Omega-3-acid ethyl esters) .... Take 1 capsule by mouth once a day 9)  Vitamin D3 1000 Unit Tabs (Cholecalciferol) .... Take 1 tablet by mouth once a day 10)  Omnaris 50 Mcg/act Susp (Ciclesonide) .Marland Kitchen.. 1 puff each nostril once daily 11)  Testosterone Cypionate 200 Mg/ml Oil (Testosterone cypionate) .Marland Kitchen.. 1 cc im monthly 12)  Lisinopril 10 Mg Tabs (Lisinopril) .... Take 1 tablet by mouth once a day  Other Orders: Doppler Referral (Doppler) Radiology Referral (Radiology) limited DT sinus  Patient Instructions: 1)  . Prescriptions: LISINOPRIL 10 MG TABS (LISINOPRIL) Take 1 tablet by mouth once a day  #90 x 3   Entered and Authorized by:   Birdie Sons MD   Signed by:   Birdie Sons MD on 10/10/2009   Method used:   Electronically to        [facility] (retail)       [address]       [place], [place]   [id number]       Ph: [phone] or [phone]       Fax: [phone]   RxID:   [id number]

## 2010-03-05 NOTE — Assessment & Plan Note (Signed)
Summary: both arm painful//ccm   Vital Signs:  Patient profile:   55 year old male Temp:     98.6 degrees F oral BP sitting:   140 / 90  (left arm) Cuff size:   regular  Vitals Entered By: Sid Falcon LPN (February 15, 2009 4:06 PM)  History of Present Illness: Patient seen with bilateral upper extremity pain and upper back and neck pain. History of similar soft tissue pain in the past. Previous evaluation with rheumatologist. Presumptive diagnosis of fibromyalgia. Generally poor quality sleep. Increased stress with work recently which may have exacerbated.  Describes pain as a deep achy pain mostly upper arms bilaterally. No specific neck pain. Massage helps somewhat. Symptoms worse supine. No relief with Tylenol. Avoids anti-inflammatory medications.  Allergies: 1)  ! 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    Physical Exam  General:  Well-developed,well-nourished,in no acute distress; alert,appropriate and cooperative throughout examination Head:  Normocephalic and atraumatic without obvious abnormalities. No apparent alopecia or balding. Neck:  No deformities, masses, or tenderness noted. Lungs:  Normal respiratory effort, chest expands symmetrically. Lungs are clear to auscultation, no crackles or wheezes. Heart:  Normal rate and regular rhythm. S1 and S2 normal without gallop, murmur, click, rub or other extra  sounds. Extremities:  full range of motion both shoulders. Patient does have several soft tissue areas of tenderness symmetrically distributed including the trapezius, brachioradialis and upper deltoid region. No bicipital tenderness. Neurologic:  full strength upper extremities. Symmetric upper extremity reflexes. Normal sensory function upper extremities   Impression & Recommendations:  Problem # 1:  FIBROMYALGIA (ICD-729.1)  continue aerobic exercise and massage. Short-term trial of low-dose cyclobenzaprine.  Reviewed possible side effects of medication. His updated medication list for this problem includes:    Aspir-81 81 Mg Tbec (Aspirin) .Marland Kitchen... Take 1 tablet by mouth once a day    Cyclobenzaprine Hcl 5 Mg Tabs (Cyclobenzaprine hcl) ..... One by mouth at bedtime as needed  Complete Medication List: 1)  Aspir-81 81 Mg Tbec (Aspirin) .... Take 1 tablet by mouth once a day 2)  Plavix 75 Mg Tabs (Clopidogrel bisulfate) .... Take 1 tablet by mouth once a day 3)  Toprol Xl 50 Mg Tb24 (Metoprolol succinate) .... Take 1 tablet by mouth once a day 4)  Pravastatin Sodium 20 Mg Tabs (Pravastatin sodium) .... One daily 5)  Protonix 40 Mg Tbec (Pantoprazole sodium) .Marland Kitchen.. 1 tablet by mouth once daily 6)  Allopurinol 300 Mg Tabs (Allopurinol) .... Take 1 tab every day 7)  Colchicine 0.6 Mg Tabs (Colchicine) .... 2 tabs by mouth x 1, then 1 tab by mouth q 2 hours as needed for pain (max 10 tabs/24hours) 8)  Cyclobenzaprine Hcl 5 Mg Tabs (Cyclobenzaprine hcl) .... One by mouth at bedtime as needed  Patient Instructions: 1)  Continue aerobic exercise such as walking 2)  Continue massage therapy Prescriptions: CYCLOBENZAPRINE HCL 5 MG TABS (CYCLOBENZAPRINE HCL) one by mouth at bedtime as needed  #30 x 1   Entered and Authorized by:   Evelena Peat MD  Signed by:   Evelena Peat MD on 02/15/2009   Method used:   Electronically to        [facility] (retail)       [address]        [place], [place]  [id number]       Ph: [phone] or [phone]       Fax: [phone]   RxID:   [id number]

## 2010-03-07 NOTE — Assessment & Plan Note (Signed)
Summary: 6 month rov/njr/pt rescd//ccm   Vital Signs:  Patient profile:   55 year old male Weight:      242 pounds Temp:     98.6 degrees F oral Pulse rate:   88 / minute Pulse rhythm:   regular BP sitting:   124 / 82  (left arm) Cuff size:   large  Vitals Entered By: Alfred Levins, CMA (February 13, 2010 8:52 AM) CC: discuss lab results   Primary Care Provider:  Leeanne Deed  CC:  discuss lab results.  History of Present Illness:  Follow-Up Visit      This is a 55 year old man who presents for Follow-up visit.  The patient denies chest pain and palpitations.  Since the last visit the patient notes no new problems or concerns and being seen by a specialist.  The patient reports taking meds as prescribed.  When questioned about possible medication side effects, the patient notes none.   seeing ENT and currently taking ABX for sinusitis  Current Medications (verified): 1)  Aspirin 325 Mg Tabs (Aspirin) .Marland Kitchen.. 1 Daily 2)  Plavix 75 Mg Tabs (Clopidogrel Bisulfate) .... Take 1 Tablet By Mouth Once A Day 3)  Toprol Xl 50 Mg Tb24 (Metoprolol Succinate) .... Take 1 Tablet By Mouth Once A Day 4)  Crestor 10 Mg Tabs (Rosuvastatin Calcium) .Marland Kitchen.. 1 Tablet By Mouth Daily 5)  Allopurinol 300 Mg Tabs (Allopurinol) .... Take 1 Tab Every Day 6)  Coq10 100 Mg Caps (Coenzyme Q10) .... Take 1 Capsule By Mouth Once A Day 7)  Lovaza 1 Gm Caps (Omega-3-Acid Ethyl Esters) .... Take 1 Capsule By Mouth Once A Day 8)  Vitamin D3 1000 Unit Tabs (Cholecalciferol) .... Take 1 Tablet By Mouth Once A Day 9)  Omnaris 50 Mcg/act Susp (Ciclesonide) .Marland Kitchen.. 1 Puff Each Nostril Once Daily 10)  Testosterone Cypionate 200 Mg/ml Oil (Testosterone Cypionate) .Marland Kitchen.. 1 Cc Im Monthly 11)  Cetirizine Hcl 10 Mg Tabs (Cetirizine Hcl) .Marland Kitchen.. 1 Tab By Mouth Once Two Times A Day 12)  Famotidine 20 Mg Tabs (Famotidine) .Marland Kitchen.. 1 Tab By Mouth Two Times A Day 13)  Lisinopril 20 Mg Tabs (Lisinopril) .... Take One Tablet By Mouth Daily 14)   Hydrochlorothiazide 25 Mg Tabs (Hydrochlorothiazide) .Marland Kitchen.. 1 Tab By Mouth Daily 15)  Ayr Saline Nasal Drops 0.65 % Soln (Saline) .... Use As Needed  Allergies (verified): No Known Drug Allergies  Physical Exam  General:  alert and well-developed.   Head:  normocephalic and atraumatic.   Neck:  supple.   Chest Wall:  No deformities, masses, tenderness or gynecomastia noted. Lungs:  normal respiratory effort and no intercostal retractions.   Heart:  normal rate and regular rhythm.   Abdomen:  soft and non-tender.   Skin:  turgor normal and color normal.     Impression & Recommendations:  Problem # 1:  HYPERTENSION (ICD-401.9) controlled continue current medications  His updated medication list for this problem includes:    Toprol Xl 50 Mg Tb24 (Metoprolol succinate) .Marland Kitchen... Take 1 tablet by mouth once a day    Lisinopril 20 Mg Tabs (Lisinopril) .Marland Kitchen... Take one tablet by mouth daily    Hydrochlorothiazide 25 Mg Tabs (Hydrochlorothiazide) .Marland Kitchen... 1 tab by mouth daily  BP today: 124/82 Prior BP: 142/82 (01/16/2010)  Prior 10 Yr Risk Heart Disease: N/A (10/29/2009)  Labs Reviewed: K+: 4.0 (02/06/2010) Creat: : 0.8 (02/06/2010)   Chol: 145 (02/06/2010)   HDL: 35.40 (02/06/2010)   LDL: 102 (07/13/2008)  TG: 205.0 (02/06/2010)  Problem # 2:  HYPERLIPIDEMIA (ICD-272.4) adequate control note HDL advised regular exercise His updated medication list for this problem includes:    Crestor 10 Mg Tabs (Rosuvastatin calcium) .Marland Kitchen... 1 tablet by mouth daily    Lovaza 1 Gm Caps (Omega-3-acid ethyl esters) .Marland Kitchen... Take 1 capsule by mouth once a day  Labs Reviewed: SGOT: 38 (02/06/2010)   SGPT: 39 (02/06/2010)  Prior 10 Yr Risk Heart Disease: N/A (10/29/2009)   HDL:35.40 (02/06/2010), 35.80 (08/29/2009)  LDL:102 (07/13/2008), 94 (13/09/6576)  Chol:145 (02/06/2010), 167 (08/29/2009)  Trig:205.0 (02/06/2010), 210.0 (08/29/2009)  Problem # 3:  GOUT (ICD-274.9)  no recurrence His updated  medication list for this problem includes:    Allopurinol 300 Mg Tabs (Allopurinol) .Marland Kitchen... Take 1 tab every day  Problem # 4:  CORONARY ARTERY DISEASE (ICD-414.00)  no sxs continue current medications  His updated medication list for this problem includes:    Aspirin 325 Mg Tabs (Aspirin) .Marland Kitchen... 1 daily    Plavix 75 Mg Tabs (Clopidogrel bisulfate) .Marland Kitchen... Take 1 tablet by mouth once a day    Toprol Xl 50 Mg Tb24 (Metoprolol succinate) .Marland Kitchen... Take 1 tablet by mouth once a day    Lisinopril 20 Mg Tabs (Lisinopril) .Marland Kitchen... Take one tablet by mouth daily    Hydrochlorothiazide 25 Mg Tabs (Hydrochlorothiazide) .Marland Kitchen... 1 tab by mouth daily  ETT Interpretation: abnormal-evidence of ST depression consistent with ischemia (10/29/2009)  ETT Comments: Good exercise capacity.  Marked hypertensive response to exercise.  Pt has 1-2 mm ST depression with peak exertion, resolves quickly in recovery. This was associated with mild left upper back pain. No significant arrhythmia. (10/29/2009)  Labs Reviewed: Chol: 145 (02/06/2010)   HDL: 35.40 (02/06/2010)   LDL: 102 (07/13/2008)   TG: 205.0 (02/06/2010)  Complete Medication List: 1)  Aspirin 325 Mg Tabs (Aspirin) .Marland Kitchen.. 1 daily 2)  Plavix 75 Mg Tabs (Clopidogrel bisulfate) .... Take 1 tablet by mouth once a day 3)  Toprol Xl 50 Mg Tb24 (Metoprolol succinate) .... Take 1 tablet by mouth once a day 4)  Crestor 10 Mg Tabs (Rosuvastatin calcium) .Marland Kitchen.. 1 tablet by mouth daily 5)  Allopurinol 300 Mg Tabs (Allopurinol) .... Take 1 tab every day 6)  Coq10 100 Mg Caps (Coenzyme q10) .... Take 1 capsule by mouth once a day 7)  Lovaza 1 Gm Caps (Omega-3-acid ethyl esters) .... Take 1 capsule by mouth once a day 8)  Vitamin D3 1000 Unit Tabs (Cholecalciferol) .... Take 1 tablet by mouth once a day 9)  Omnaris 50 Mcg/act Susp (Ciclesonide) .Marland Kitchen.. 1 puff each nostril once daily 10)  Testosterone Cypionate 200 Mg/ml Oil (Testosterone cypionate) .Marland Kitchen.. 1 cc im monthly 11)   Cetirizine Hcl 10 Mg Tabs (Cetirizine hcl) .Marland Kitchen.. 1 tab by mouth once two times a day 12)  Famotidine 20 Mg Tabs (Famotidine) .Marland Kitchen.. 1 tab by mouth two times a day 13)  Lisinopril 20 Mg Tabs (Lisinopril) .... Take one tablet by mouth daily 14)  Hydrochlorothiazide 25 Mg Tabs (Hydrochlorothiazide) .Marland Kitchen.. 1 tab by mouth daily 15)  Ayr Saline Nasal Drops 0.65 % Soln (Saline) .... Use as needed  Patient Instructions: 1)  6 months CPX   Orders Added: 1)  Est. Patient Level IV [46962]

## 2010-03-07 NOTE — Assessment & Plan Note (Signed)
Summary: 2 month fup//ccm   Vital Signs:  Patient profile:   55 year old male Weight:      241 pounds Temp:     98.6 degrees F oral Pulse rate:   68 / minute Pulse rhythm:   regular BP sitting:   142 / 82  (left arm) Cuff size:   large  Vitals Entered By: Alfred Levins, CMA (January 16, 2010 8:47 AM) CC: f/u   Primary Care Provider:  Leeanne Deed  CC:  f/u.  History of Present Illness:  Follow-Up Visit      This is a Leonard Edwards who presents for Follow-up visit.  The patient denies chest pain, palpitations, dizziness, and SOB.  Since the last visit the patient notes no new problems or concerns and being seen by a specialist.  The patient reports taking meds as prescribed.  When questioned about possible medication side effects, the patient notes none.  I've reviewed note from [facility].  All other systems reviewed and were negative --continued sxs of rhinitis  Current Medications (verified): 1)  Aspirin 325 Mg Tabs (Aspirin) .Marland Kitchen.. 1 Daily 2)  Plavix 75 Mg Tabs (Clopidogrel Bisulfate) .... Take 1 Tablet By Mouth Once A Day 3)  Toprol Xl 50 Mg Tb24 (Metoprolol Succinate) .... Take 1 Tablet By Mouth Once A Day 4)  Simvastatin 20 Mg Tabs (Simvastatin) .... Take One Tablet By Mouth Daily At Bedtime 5)  Allopurinol 300 Mg Tabs (Allopurinol) .... Take 1 Tab Every Day 6)  Coq10 100 Mg Caps (Coenzyme Q10) .... Take 1 Capsule By Mouth Once A Day 7)  Lovaza 1 Gm Caps (Omega-3-Acid Ethyl Esters) .... Take 1 Capsule By Mouth Once A Day 8)  Vitamin D3 1000 Unit Tabs (Cholecalciferol) .... Take 1 Tablet By Mouth Once A Day 9)  Omnaris 50 Mcg/act Susp (Ciclesonide) .Marland Kitchen.. 1 Puff Each Nostril Once Daily 10)  Testosterone Cypionate 200 Mg/ml Oil (Testosterone Cypionate) .Marland Kitchen.. 1 Cc Im Monthly 11)  Cetirizine Hcl 10 Mg Tabs (Cetirizine Hcl) .Marland Kitchen.. 1 Tab By Mouth Once Two Times A Day 12)  Famotidine 20 Mg Tabs (Famotidine) .Marland Kitchen.. 1 Tab By Mouth Two Times A Day 13)  Align 4 Mg Caps  (Probiotic Product) .Marland Kitchen.. 1 Cap By Mouth Qd 14)  Lisinopril 20 Mg Tabs (Lisinopril) .... Take One Tablet By Mouth Daily 15)  Hydrochlorothiazide 25 Mg Tabs (Hydrochlorothiazide) .Marland Kitchen.. 1 Tab By Mouth Daily 16)  Ayr Saline Nasal Drops 0.65 % Soln (Saline) .... Use As Needed  Allergies (verified): No Known Drug Allergies  Past History:  Past Medical History: Last updated: 10/02/2009 Current Problems:  CORONARY ARTERY DISEASE (ICD-414.00)- Drug eluting stent in the distal right coronary artery 2006.  HYPERTENSION (ICD-401.9) HYPERLIPIDEMIA (ICD-272.4) GERD (ICD-530.81) GASTRIC POLYP (ICD-211.1) HIATAL HERNIA (ICD-553.3) ESOPHAGEAL STRICTURE (ICD-530.3) GOUT (ICD-274.9) BENIGN PROSTATIC HYPERTROPHY (ICD-600.00)  Past Surgical History: Last updated: 05/01/2009  Drug eluting stent in the distal right coronary artery.  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 HEENT exam atraumatic, normocephalic symmetric her muscles are intact. Neck is supple without lymphadenopathy or thyromegaly. Chest clear to auscultation cardiac exam S1-S2 are regular. Abdominal exam obese, to  bowel sounds, soft her extremities there is no clubbing cyanosis or edema.   Impression & Recommendations:  Problem # 1:  CORONARY ARTERY DISEASE (ICD-414.00)  no symptoms. Continue current medications. advised regular exercise and dramatic weight loss His updated medication list for this problem includes:    Aspirin 325 Mg Tabs (Aspirin) .Marland Kitchen... 1 daily    Plavix 75 Mg Tabs (Clopidogrel bisulfate) .Marland Kitchen...  Take 1 tablet by mouth once a day    Toprol Xl 50 Mg Tb24 (Metoprolol succinate) .Marland Kitchen... Take 1 tablet by mouth once a day    Lisinopril 20 Mg Tabs (Lisinopril) .Marland Kitchen... Take one tablet by mouth daily    Hydrochlorothiazide 25 Mg Tabs (Hydrochlorothiazide) .Marland Kitchen... 1 tab by mouth daily  ETT Interpretation: abnormal-evidence of ST depression consistent with ischemia (10/29/2009)  ETT Comments: Good exercise capacity.  Marked hypertensive response to exercise.  Pt has 1-2 mm ST depression with peak exertion, resolves quickly in recovery. This was associated with mild left upper back pain. No significant arrhythmia. (10/29/2009)  Labs Reviewed: Chol: 167 (08/29/2009)   HDL: 35.80 (08/29/2009)   LDL: 102 (07/13/2008)   TG: 210.0 (08/29/2009)  Problem # 2:  HYPERTENSION (ICD-401.9) I'll ask him to monitor at home. Continue current medications. His updated medication list for this problem includes:    Toprol Xl 50 Mg Tb24 (Metoprolol succinate) .Marland Kitchen... Take 1 tablet by mouth once a day    Lisinopril 20 Mg Tabs (Lisinopril) .Marland Kitchen... Take one tablet by mouth daily    Hydrochlorothiazide 25 Mg Tabs (Hydrochlorothiazide) .Marland Kitchen... 1 tab by mouth daily  BP today: 142/82 Prior BP: 142/82 (12/05/2009)  Prior 10 Yr Risk Heart Disease: N/A (10/29/2009)  Labs Reviewed: K+: 3.8 (10/29/2009) Creat: : 0.9 (10/29/2009)   Chol: 167 (08/29/2009)   HDL: 35.80 (08/29/2009)   LDL: 102 (07/13/2008)   TG: 210.0 (08/29/2009)  Problem # 3:  SINUSITIS, CHRONIC (ICD-473.9) reviewed [facility] note on septra currently he continues to have numerous complaints related to "sinuses". He continues to complain of "vibration" in sinuses I've encouraged him to continue to f/u with [facility] ENT His updated medication list for this problem includes:    Omnaris 50 Mcg/act Susp (Ciclesonide) .Marland Kitchen... 1 puff each nostril once daily    Ayr Saline Nasal Drops 0.65 % Soln (Saline) ..... Use as needed  Problem # 4:  GERD (ICD-530.81)  His updated  medication list for this problem includes:    Famotidine 20 Mg Tabs (Famotidine) .Marland Kitchen... 1 tab by mouth two times a day  EGD: #1 esophageal stricture, #2 hiatal hernia, #3 benign gastric polyp(fundic gland type), THERAPY : 54 French Maloney dilation (01/14/2000)  Labs Reviewed: Hgb: 16.2 (10/29/2009)   Hct: 46.9 (10/29/2009)  Complete Medication List: 1)  Aspirin 325 Mg Tabs (Aspirin) .Marland Kitchen.. 1 daily 2)  Plavix 75 Mg Tabs (Clopidogrel bisulfate) .... Take 1 tablet by mouth once a day 3)  Toprol Xl 50 Mg Tb24 (Metoprolol succinate) .... Take 1 tablet by mouth once a day 4)  Simvastatin 20 Mg Tabs (Simvastatin) .... Take one tablet by mouth daily at bedtime 5)  Allopurinol 300 Mg Tabs (Allopurinol) .... Take 1 tab every day 6)  Coq10 100 Mg Caps (Coenzyme q10) .... Take 1 capsule by mouth once a day 7)  Lovaza 1 Gm Caps (Omega-3-acid ethyl esters) .... Take 1 capsule by mouth once a day 8)  Vitamin D3 1000 Unit Tabs (Cholecalciferol) .... Take 1 tablet by mouth once a day 9)  Omnaris 50 Mcg/act Susp (Ciclesonide) .Marland KitchenMarland KitchenMarland Kitchen 1  puff each nostril once daily 10)  Testosterone Cypionate 200 Mg/ml Oil (Testosterone cypionate) .Marland Kitchen.. 1 cc im monthly 11)  Cetirizine Hcl 10 Mg Tabs (Cetirizine hcl) .Marland Kitchen.. 1 tab by mouth once two times a day 12)  Famotidine 20 Mg Tabs (Famotidine) .Marland Kitchen.. 1 tab by mouth two times a day 13)  Align 4 Mg Caps (Probiotic product) .Marland Kitchen.. 1 cap by mouth qd 14)  Lisinopril 20 Mg Tabs (Lisinopril) .... Take one tablet by mouth daily 15)  Hydrochlorothiazide 25 Mg Tabs (Hydrochlorothiazide) .Marland Kitchen.. 1 tab by mouth daily 16)  Ayr Saline Nasal Drops 0.65 % Soln (Saline) .... Use as needed  Other Orders: Admin of patients own med IM/SQ [phone])  Patient Instructions: 1)  CPX 5 months 2)  A1c and testosterone levels at the time of CPX   Medication Administration  Injection # 1:    Medication: Testosterone Cypionat 200mg  ing    Diagnosis: TESTICULAR HYPOFUNCTION (ICD-257.2)    Route: IM     Site: RUOQ gluteus    Exp Date: 2/13    Lot #: [id number]    Mfr: [email or web address]    Patient tolerated injection without complications    Given by: Alfred Levins, CMA (January 16, 2010 8:58 AM)  Orders Added: 1)  Admin of patients own med IM/SQ [96372M] 2)  Est. Patient Level III [id number]

## 2010-03-07 NOTE — Progress Notes (Signed)
Summary: Pt has a question re: lab results. Pt req doctor to call  Phone Note Call from Patient Call back at [phone] cell   Caller: Patient Summary of Call: Pt called and got the lab results, but has a questions re: those results and is req that Dr. Cato Mulligan call the pt to discuss.  Initial call taken by: Lucy Antigua,  February 13, 2010 10:35 AM  Follow-up for Phone Call        testosterone level not done, pt will come in on Monday for bloodwork Follow-up by: Alfred Levins, CMA,  February 14, 2010 4:05 PM

## 2010-03-20 ENCOUNTER — Other Ambulatory Visit: Payer: Self-pay | Admitting: Internal Medicine

## 2010-04-02 ENCOUNTER — Telehealth (INDEPENDENT_AMBULATORY_CARE_PROVIDER_SITE_OTHER): Payer: Self-pay | Admitting: *Deleted

## 2010-04-11 ENCOUNTER — Ambulatory Visit: Payer: Self-pay | Admitting: Internal Medicine

## 2010-04-11 NOTE — Progress Notes (Signed)
  Harris,Crouch,Long,Scott & Zenaida Deed, Request received for records sent to [facility]  April 02, 2010 10:15 AM

## 2010-04-16 ENCOUNTER — Encounter: Payer: Self-pay | Admitting: Internal Medicine

## 2010-04-17 ENCOUNTER — Ambulatory Visit (INDEPENDENT_AMBULATORY_CARE_PROVIDER_SITE_OTHER): Payer: PRIVATE HEALTH INSURANCE | Admitting: Family Medicine

## 2010-04-17 DIAGNOSIS — E291 Testicular hypofunction: Secondary | ICD-10-CM

## 2010-04-17 DIAGNOSIS — E349 Endocrine disorder, unspecified: Secondary | ICD-10-CM

## 2010-04-18 LAB — BASIC METABOLIC PANEL
BUN: 10 mg/dL (ref 6–23)
CO2: 26 mEq/L (ref 19–32)
Chloride: 104 mEq/L (ref 96–112)
Creatinine, Ser: 0.69 mg/dL (ref 0.4–1.5)
Glucose, Bld: 98 mg/dL (ref 70–99)

## 2010-04-18 LAB — CBC
HCT: 45.6 % (ref 39.0–52.0)
MCH: 31.3 pg (ref 26.0–34.0)
MCHC: 34.6 g/dL (ref 30.0–36.0)
MCV: 90.5 fL (ref 78.0–100.0)
Platelets: 247 10*3/uL (ref 150–400)
RDW: 12.4 % (ref 11.5–15.5)

## 2010-04-19 MED ORDER — TESTOSTERONE CYPIONATE 200 MG/ML IM SOLN
200.0000 mg | INTRAMUSCULAR | Status: DC
  Administered 2010-04-17 – 2010-07-09 (×2): 200 mg via INTRAMUSCULAR

## 2010-06-18 NOTE — Assessment & Plan Note (Signed)
[facility]                                 ON-CALL NOTE   Leonard Edwards, Leonard Edwards                       MRN:          [id number]  DATE:10/11/2007                            DOB:          Jul 09, 1955    Phone number is [phone].   CALLER:  Blaze Nylund, the wife.   OBJECTIVE:  The patient has fever with vomiting and diarrhea.  He was  nauseated last night most of night, had 99.6 temperature this morning  and then went up to 101.7.  He has vomited multiple times since then,  has diarrhea as well.  He denies traveling, did tailgate at Saturday on  [facility] game, went to [employer] yesterday with his daughter.  She is not sick.  He  has allergies but no other medical problems and seems reasonably  healthy, otherwise.   OBJECTIVE:  Presumed gastroenteritis.   PLAN:  We will call in Phenergan for him to [facility] at [facility],  called in 2 suppositories and 10 tablets to be used 1 p.o. or p.r. q. 6  p.r.n. nausea.  I also, discussed going on clear liquids for 24 hours;  bananas, rice, applesauce, toast for the next 24 hours and then to be  seen if symptoms do not improve.   Primary care Arius Harnois is Dr. Cato Mulligan, home office is [place].     Arta Silence, MD  Electronically Signed    RNS/MedQ  DD: 10/11/2007  DT: 10/11/2007  Job #: [id number]

## 2010-06-21 NOTE — Assessment & Plan Note (Signed)
[facility]                            CARDIOLOGY OFFICE NOTE   LYDIA, MENG                       MRN:          [id number]  DATE:05/26/2006                            DOB:          28-Apr-1955    PRIMARY CARE PHYSICIAN:  Dr. Birdie Sons.   HISTORY OF PRESENT ILLNESS:  Mr. Szeto is a 55 year old gentleman with  hypertension who presented with unstable angina in 2006.  I placed a  drug eluting stent in his distal right coronary artery.  He also has a  significant stenosis at the ostium of a moderate sized diagonal branch  and at the very most apical portion of the LAD.  We have been managing  these medically.  His left ventricular systolic function is preserved.   Since his intervention, he has remained without angina.  He is trying to  exercise more.  He has not had any chest discomfort with exercise or  otherwise. He has not had any dyspnea, PND, orthopnea, edema, or  claudication.  We have been working on managing his blood pressure but  diastolic's have remained mostly in the 90's.   CURRENT MEDICATIONS:  Aspirin 325 mg daily, Plavix 75 mg daily, Toprol  XL 50 mg daily, Prevacid 30 mg daily, Lisinopril 40 mg daily, Zocor 20  mg daily, HCTZ 12.5 mg daily.   GENERAL:  Mr. Schubach is generally well-appearing, in no distress.  VITAL SIGNS:  Heart rate is 77, blood pressure 140/96, and weight of 238  pounds. Weight is down 3 pounds compared with early March.  NECK:  He has no jugular venous distension, thyromegaly or  lymphadenopathy.  LUNGS:  Clear to auscultation and his respiratory effort is normal.  CARDIOVASCULAR:  He has a nondisplaced point of maximal cardiac impulse.  There is a regular rate and rhythm without murmur or S3. There is a soft  S4.  ABDOMEN:  Soft, nondistended, nontender. There is no hepatosplenomegaly  or midline pulsatile mass. There is no abdominal bruit. Bowel sounds are  normal.  EXTREMITIES:  Warm without  clubbing, cyanosis, edema or ulceration.  PULSES:  Carotid pulses are 2+ bilaterally without bruit. DP pulses 2+  bilaterally.   Electrocardiogram demonstrates normal sinus rhythm and there is a normal  EKG.   IMPRESSION/RECOMMENDATIONS:  1. Coronary disease:  Drug eluting stent in the distal right coronary      artery. Continue aspirin and Plavix indefinitely.  2. Hypertension:  Blood pressure remains elevated despite compliance      with three medications.  He is reluctant to increase his      medications and wishes to push exercise and diet further.  Since I      will be leaving, will hand over management of his hypertension to      Dr. Cato Mulligan. The patient will follow-up with Dr. Cato Mulligan in a few      week's time.  I have let Mr. Micalizzi known that his blood pressure      at the current level is definitely not acceptable.  3. Hypercholesterolemia:  Managed by Dr. Cato Mulligan.  LDL  less than 70.   I will set him up to follow-up with Dr. Diona Browner in 12 month's time.     Salvadore Farber, MD  Electronically Signed    WED/MedQ  DD: 05/26/2006  DT: 05/26/2006  Job #: [id number]   cc:   Valetta Mole. Swords, MD

## 2010-06-21 NOTE — Cardiovascular Report (Signed)
NAMESHAMARCUS, HOHEISEL                ACCOUNT NO.:  [id number]   MEDICAL RECORD NO.:  [id number]          PATIENT TYPE:  OIB   LOCATION:  6526                         FACILITY:  [facility]   PHYSICIAN:  Salvadore Farber, M.D. LHCDATE OF BIRTH:  10/07/1955   DATE OF PROCEDURE:  09/04/2004  DATE OF DISCHARGE:  09/05/2004                              CARDIAC CATHETERIZATION   PROCEDURE:  Left heart catheterization, left ventriculography, coronary  angiography, drug eluting stent placement in the distal RCA.   INDICATIONS FOR PROCEDURE:  Mr. Swinger is a 55 year old gentleman with  hypertension who presents with three to four weeks of exertional chest  discomfort which has been progressive both with respect to the level of  exertion required to bring it on and the severity of the discomfort.  He has  not had any rest pain.  However, he has had discomfort with very minimal  exertion.  He is referred for angiography with an eye to percutaneous  revascularization.   PROCEDURAL TECHNIQUE:  Informed consent was obtained.  Under 1% lidocaine  local anesthesia, a 5 French sheath was placed in the right common femoral  artery using modified Seldinger technique.  Diagnostic angiography was  performed using JL4 and JR4 catheters.  Left heart catheterization and  ventriculography was performed using a 5 French pigtail catheter.   Diagnostic images demonstrated a 95% stenosis of the distal RCA extending  into the proximal portion of the PDA.  There was also a 90% stenosis at the  ostium of the small diagonal branch.  Decision was made to proceed with  percutaneous revascularization of the RCA and medical therapy of the  diagonal.   Oral Plavix 300 mg was administered.  Double bolus eptifibatide and heparin  were administered to achieve and maintain an ACT of greater than 200  seconds.  Sheath was upsized over a wire to 6 [place].  A 6 [place] FR4 guide  was advanced over a wire and engaged in the ostium  of the RCA.  I was unable  to advance a Prowater wire beyond the lesion.  I was then able to advance a  Whisper wire beyond the lesion.  The lesion was then predilated using a 2.0  x 9 mm Maverick used for two inflations each of 6 atmospheres.  There was  some pinching of the posterolateral branch with this predilation.  Therefore, the left ventricular branch was wired using a Prowater wire.  It  was dilated using a 2.5 x 15 mm Maverick at 6 atmospheres.  I then removed  the Prowater wire and proceeded to stent the distal RCA extending into the  PDA across the origin of the posterolateral branch.  A 2.5 x 24 mm TAXUS  could not be advanced across the lesion.  I then exchanged via an over-the-  wire Maverick wire lumen for a BMWwire.  Further predilation was performed  using the 2.0 x 9 mm Maverick at 10 atmospheres for two inflations.  I  remained unable to advance the stent.  I then advanced a Wiggler wire in a  buddy fashion into the  distal PDA.  Over this wire, I was able to advance  the 2.5 x 24 mm TAXUS stent.  This was then deployed at 16 atmospheres.  The  stent was then post dilated using a 2.75 x 15 mm Maverick at 18 atmospheres.  Repeat angiography using a repeat angiography demonstrated the stent to be  somewhat undersized.  It was then further predilated using a 3.0 x 12 mm  Maverick in its mid portion at 18 atmospheres.  Final angiography  demonstrated no residual stenosis.  The jailed posterolateral branch had  TIMI-3 flow and no significant stenosis at its ostium.   The arteriotomy was then closed using a StarClose device.  Complete  hemostasis was obtained.  Patient was then transferred to the holding room  in stable condition, having tolerated procedure well.   COMPLICATIONS:  None.   FINDINGS:  1.  LV:  188/111/19.  EF 55% with mild inferior hypokinesis.  2.  No aortic stenosis or mitral regurgitation.  3.  Left main:  Angiographically normal.  4.  LAD:  Moderate  size vessel giving rise to two diagonals.  The first      diagonal is approximately 1 mm in size and has a 90% ostial stenosis.      The second diagonal is moderate sized and has a 60 % ostial stenosis.      The very distal portion of the LAD has a 90% stenosis.  Territory      supplied distal to this lesion is small.  5.  Circumflex:  Moderate size vessel giving rise to three obtuse marginals.      It has only minor luminal irregularities.  6.  RCA:  Moderate size dominant vessel.  There is a 30% stenosis      proximally.  There was a 95% stenosis in the distal vessel in continuity      with a 60% stenosis of the ostium of the PDA.  Both of these were      treated with a single drug eluting stent.  Patient tolerated the      procedure well.   IMPRESSION/RECOMMENDATIONS:  Successful drug eluting stent placement in the  distal right coronary artery and proximal posterior descending artery.  He  is being maintained on both aspirin and Plavix indefinitely given the drug  eluting stent.      Salvadore Farber, M.D. [facility]  Electronically Signed     WED/MEDQ  D:  11/21/2004  T:  11/21/2004  Job:  [id number]

## 2010-06-21 NOTE — Assessment & Plan Note (Signed)
[facility]                            CARDIOLOGY OFFICE NOTE   Leonard Edwards, Leonard Edwards                       MRN:          [id number]  DATE:04/09/2006                            DOB:          02-Mar-1955    PRIMARY CARE PHYSICIAN:  Dr. Birdie Edwards.   [name] is a 55 year old gentleman with hypertension who presented  with unstable angina in July 2006. I placed a drug-eluting stent in his  distal right coronary artery. He also has significant stenosis at the  ostium of a moderate sized diagonal branch and at the very most apical  portion of the LAD. We have been managing these medically. Left  ventricular systolic function is preserved.   Since his intervention he has remained without angina. He exercises  occasionally. He has not had any chest discomfort, unusual dyspnea, PND,  orthopnea, edema or claudication. His weight has stabilized albeit  heavier than he was a year ago. He checks his blood pressure fairly  regularly and finds his diastolic most commonly in the mid 90s.   CURRENT MEDICATIONS:  1. Aspirin 325 mg daily.  2. Plavix 75 mg daily.  3. Toprol XL 50 mg daily.  4. Prevacid 30 mg daily.  5. Lisinopril 20 mg daily.  6. Zocor (dose unclear).   PHYSICAL EXAMINATION:  GENERAL:  He is generally well-appearing though  overweight and in no distress.  VITAL SIGNS:  Heart rate 72, blood pressure 138/98 which is unchanged on  recheck, weight 241 pounds, weight is stable compared with January.  NECK:  He has no jugular venous distention, thyromegaly or  lymphadenopathy.  LUNGS:  Clear to auscultation.  HEART:  He has nondisplaced point of maximal cardiac impulse. There is a  regular rate and rhythm with no murmur or S3. There is a soft S4.  ABDOMEN:  Soft, nondistended, nontender. There is no hepatosplenomegaly  or midline pulsatile mass. There is no abdominal bruit. Bowel sounds are  normal.  EXTREMITIES:  Warm without clubbing, cyanosis,  edema or ulceration.  Carotid pulses are 2+ bilaterally without bruit. PT pulses are 2+  bilaterally.   IMPRESSION/RECOMMENDATIONS:  1. Coronary disease asymptomatic:  Drug-eluting stent in the distal      right coronary artery. Therefore continue aspirin and Plavix      indefinitely.  2. Hypertension:  Blood pressure remains elevated. Will add HCTZ.      Follow up with me in four to six weeks for repeat blood pressure      check. Will check BMET at that time.  3. Hypercholesterolemia:  Managed by Dr. Cato Edwards. Goal LDL less than      70. HDL greater than 40.     Leonard Farber, MD  Electronically Signed    WED/MedQ  DD: 04/09/2006  DT: 04/09/2006  Job #: [id number]   cc:   Leonard Mole. Swords, MD

## 2010-06-21 NOTE — Assessment & Plan Note (Signed)
[facility]                            CARDIOLOGY OFFICE NOTE   LOI, RENNAKER                       MRN:          [id number]  DATE:02/11/2006                            DOB:          Feb 15, 1955    Primary care physician:  Birdie Sons.   HISTORY OF PRESENT ILLNESS:  Mr. Pember is a 55 year old gentleman with  hypertension who presented with unstable angina in July, 2006.  I placed  a drug-eluting stent in his distal right coronary artery.  He also had  significance at the ostium of a moderate sized diagonal branch, and the  very most apical portion of the LAD.  We are managing the diagonal and  LAD disease medically due to the small size of the involved portions of  the vessels.  His left ventricular systolic function is preserved.   Since his intervention, Mr. Requena has remained stable.  He tells me he  is walking and running for about 30 minutes combined, 3-7 days a week.  With this, he is not having any chest discomfort, unusual dyspnea, PND,  orthopnea, edema, or claudication.  Unfortunately, his weight does  continue to go up.  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   His blood pressure is up today.  He is not having any headache, visual  abnormality, chest pain, or dyspnea.   CURRENT MEDICATIONS:  1. Aspirin 325 mg per day.  2. Plavix 75 mg per day.  3. Toprol XL 50 mg per day.  4. Prevacid 30 mg per day.  5. Vytorin 40/10 one per day.   PHYSICAL EXAMINATION:  He is generally well-appearing and in no distress  with heart rate 79, blood pressure 178/108.  On recheck it was 162/105.  Weight is 242 pounds, which is up 7 pounds compared with November 2006.  He has no jugular venous distension and no thyromegaly.  LUNGS:  Clear to auscultation. He has a nondisplaced point of maximum  cardiac impulse.  There is a regular rate and rhythm.  There is no  murmur or S3.  There is a soft S4.  The  abdomen is soft, nondistended, nontender.  There is no  hepatosplenomegaly.  Bowel sounds are normal.  No midline pulsatile  mass.  No abdominal bruits.  EXTREMITIES:  Warm without cyanosis, clubbing, edema, or ulceration.  Carotid pulses 2+ bilaterally without bruit.   Electrocardiogram demonstrates normal sinus rhythm and is a normal EKG.   LABORATORY STUDIES:  Dated yesterday are remarkable for a creatinine of  1.1, normal sodium and potassium.  LDL 152.   IMPRESSION/RECOMMENDATIONS:  1. Coronary disease, asymptomatic.  There is a drug-eluting stent in      the distal right coronary artery.  Continue aspirin and Plavix      indefinitely.  2. Hypertension.  Marked change since I last saw him 14 months ago.      Renal function is normal as of yesterday.  Will initiate lisinopril      20 mg a day.  Patient to followup with Dr. Cato Mulligan  tomorrow as      previously scheduled.  I have discussed the hypertension and      medications changes with Dr. Cato Mulligan via telephone today.  3. Hypercholesterolemia.  Managed by Dr. Cato Mulligan.  Goal LDL less than      70, HDL greater than 40.  4. Disposition.  I will see him back in 6 weeks' time to confirm that      we are making progress on his blood pressure.     Salvadore Farber, MD  Electronically Signed    WED/MedQ  DD: 02/11/2006  DT: 02/11/2006  Job #: [id number]   cc:   Valetta Mole. Swords, MD

## 2010-06-21 NOTE — Discharge Summary (Signed)
Leonard Edwards, Leonard Edwards                ACCOUNT NO.:  [id number]   MEDICAL RECORD NO.:  [id number]          PATIENT TYPE:  OIB   LOCATION:  6526                         FACILITY:  [facility]   PHYSICIAN:  Salvadore Farber, M.D. LHCDATE OF BIRTH:  1955-11-20   DATE OF ADMISSION:  09/04/2004  DATE OF DISCHARGE:  09/05/2004                                 DISCHARGE SUMMARY   REFERRING PHYSICIAN:  Dr. Yancey Flemings   PRIMARY CARDIOLOGIST:  Dr. Randa Evens   PRINCIPAL DIAGNOSES:  Unstable angina/coronary artery disease.   OTHER DIAGNOSES:  1.  Hypertension.  2.  Gastroesophageal reflux disease.  3.  Seasonal allergies.  4.  Obesity.   ALLERGIES:  No known drug allergies.   PROCEDURES:  Left heart cardiac catheterization with PCI and stenting of the  distal right coronary artery with TAXUS drug-eluting stent.   HISTORY OF PRESENT ILLNESS:  A 55 year old white male with no prior history  of coronary disease who has been treated for hypertension since around the  age of 55.  He has previously been very active running, walking several  miles a day and has been followed by Dr. Yancey Flemings for GERD and hiatal  hernia.  Over the past four weeks patient has been experiencing exertional 7-  8/10 substernal chest fullness and tightness radiating to the mid scapular  area sometimes associated with shortness of breath and diaphoresis lasting  15-30 minutes and relieved with rest.  Over the past week his symptoms have  become more frequent and severe.  He saw Dr. Samule Ohm in clinic on July 31 and  based on his history it was decided that he would require cardiac  catheterization.   HOSPITAL COURSE:  The patient presented to the outpatient catheterization  laboratory on August 2 where left heart cardiac catheterization was  performed revealing an EF of 55% with mild inferior hypokinesis.  He had a  90% lesion in the very distal apical LAD, normal circumflex, and a 95%  stenosis in the distal RCA.  He  also had small vessel 90% lesion in a 1 mm  first diagonal.  It is felt that the distal RCA lesion was likely the  culprit of this discomfort and this was successfully stented with a 2.5 x 24  mm TAXUS drug-eluting stent.  Patient tolerated the procedure well and  remained on Integrilin for 18 hours post procedure.  He did have a small  hematoma the evening following the procedure; however, this morning his  hematoma has resolved and his groin site is clear without bleeding or  bruits.  He has been ambulating in the hallways without recurrent discomfort  or limitations, being discharged home today in satisfactory condition.   DISCHARGE LABORATORIES:  Hemoglobin 15.1, hematocrit 43.6, WBC 10.9,  platelets 272.  Sodium 138, potassium 3.8, chloride 102, CO2 25, BUN 11,  creatinine 0.9, glucose 90.  CK 69, CK-MB 1.2, calcium 9.   DISPOSITION:  Patient is being discharged home in good condition.   FOLLOW-UP PLANS/APPOINTMENTS:  He has a follow-up appointment with [facility] on September 19, 2004 at 12  p.m.   DISCHARGE MEDICATIONS:  1.  Aspirin 325 mg daily.  2.  Plavix 75 mg daily.  3.  Toprol XL 50 mg daily.  4.  Prevacid 30 mg daily.  5.  Allegra 180 mg daily.  6.  Nitroglycerin 0.4 mg sublingual p.r.n. chest pain.  7.  Vytorin 40/10 mg q.h.s.   OUTSTANDING LABORATORY STUDIES:  None.   DURATION OF DISCHARGE ENCOUNTER:  60 minutes.       CRB/MEDQ  D:  09/05/2004  T:  09/05/2004  Job:  [id number]

## 2010-07-09 ENCOUNTER — Ambulatory Visit (INDEPENDENT_AMBULATORY_CARE_PROVIDER_SITE_OTHER): Payer: BC Managed Care – PPO | Admitting: Internal Medicine

## 2010-07-09 ENCOUNTER — Encounter: Payer: Self-pay | Admitting: Internal Medicine

## 2010-07-09 DIAGNOSIS — E291 Testicular hypofunction: Secondary | ICD-10-CM

## 2010-07-09 DIAGNOSIS — J329 Chronic sinusitis, unspecified: Secondary | ICD-10-CM

## 2010-07-09 MED ORDER — CEFUROXIME AXETIL 500 MG PO TABS: 500.0000 mg | ORAL_TABLET | Freq: Two times a day (BID) | ORAL | Status: AC

## 2010-07-09 MED ORDER — TADALAFIL 20 MG PO TABS: 20.0000 mg | ORAL_TABLET | Freq: Every day | ORAL | Status: DC | PRN

## 2010-07-09 MED ORDER — FLUTICASONE PROPIONATE 50 MCG/ACT NA SUSP: 2.0000 | Freq: Every day | NASAL | Status: DC

## 2010-07-17 ENCOUNTER — Other Ambulatory Visit: Payer: Self-pay

## 2010-07-19 NOTE — Progress Notes (Signed)
  Subjective:    Patient ID: Leonard Edwards, male    DOB: 02-09-1955, 55 y.o.   MRN: [id number]  HPI Patient comes in complaining of exacerbation of chronic sinusitis. I reviewed recent CT scan with the patient. Patient describes some pressure in his sinuses. He also describes electric sensation throughout his body but somehow he relates to his sinus congestion. In the past he been treated with long-term antibiotics with some relief of the symptoms. He denies any fevers or chills.  Past Medical History  Diagnosis Date  . CAD (coronary artery disease)   . Hyperlipidemia   . Hypertension   . GERD (gastroesophageal reflux disease)   . Hiatal hernia   . Gastric polyp   . Esophageal stricture   . Gout   . Benign prostatic hypertrophy    No past surgical history on file.  reports that he has never smoked. He does not have any smokeless tobacco history on file. He reports that he drinks alcohol. His drug history not on file. family history includes Diabetes in his sister; Heart attack in his father; and Lupus in his sister. Not on File    Review of Systems  patient denies chest pain, shortness of breath, orthopnea. Denies lower extremity edema, abdominal pain, change in appetite, change in bowel movements. Patient denies rashes, musculoskeletal complaints. No other specific complaints in a complete review of systems.      Objective:   Physical Exam Well-developed male in no acute distress. HEENT exam atraumatic, normocephalic, extraocular muscles are intact. No maxillary or frontal sinus pain to palpation. Neck is supple chest clear to auscultation.       Assessment & Plan:

## 2010-07-19 NOTE — Assessment & Plan Note (Signed)
I think the patient continues to suffer with chronic sinusitis although his symptoms are somewhat atypical. I think he needs to be evaluated by ear nose and throat. I will make that referral.

## 2010-07-24 ENCOUNTER — Encounter: Payer: Self-pay | Admitting: Internal Medicine

## 2010-08-19 ENCOUNTER — Other Ambulatory Visit: Payer: Self-pay | Admitting: Internal Medicine

## 2010-08-20 ENCOUNTER — Other Ambulatory Visit: Payer: Self-pay | Admitting: *Deleted

## 2010-09-17 ENCOUNTER — Other Ambulatory Visit: Payer: Self-pay | Admitting: Internal Medicine

## 2010-09-18 ENCOUNTER — Other Ambulatory Visit: Payer: Self-pay | Admitting: *Deleted

## 2010-09-18 MED ORDER — LISINOPRIL 20 MG PO TABS: 20.0000 mg | ORAL_TABLET | Freq: Every day | ORAL | Status: DC

## 2010-09-24 ENCOUNTER — Encounter: Payer: Self-pay | Admitting: Internal Medicine

## 2010-10-21 ENCOUNTER — Telehealth: Payer: Self-pay | Admitting: Cardiovascular Disease

## 2010-10-21 NOTE — Telephone Encounter (Signed)
Pt said he is dizzy and high bp 180/110 wants to be seen

## 2010-10-21 NOTE — Telephone Encounter (Signed)
Called stating he has been dizzy all week-end; worse Sat nite and Sunday. Dizziness comes and goes. BP has been elevated. 160-180/100-110. Has taken meds this AM; HCT 25 mg;;Lisinopril 20 mg Toprol XL 50 mg.  Has seen ENT-Dr. Jearld Fenton in past . States he is not sure if this is sinus problems. Has called Dr. Riley Kill but is not in today. BP normally is 135/80. Will return call after speaking w/Dr. Excell Seltzer.

## 2010-10-21 NOTE — Telephone Encounter (Signed)
Pt returning your call msh

## 2010-10-21 NOTE — Telephone Encounter (Signed)
Spoke with Leonard Edwards who reports blood pressures and symptoms as below.  No complaints of chest pain or shortness of breath.  Blood pressure today is 145/95.  Appt made for Leonard Edwards to see Tereso Newcomer, PA on Sept. 19, 2012 at 2:00.

## 2010-10-23 ENCOUNTER — Ambulatory Visit: Payer: BC Managed Care – PPO | Admitting: Physician Assistant

## 2010-10-24 ENCOUNTER — Ambulatory Visit (INDEPENDENT_AMBULATORY_CARE_PROVIDER_SITE_OTHER): Payer: BC Managed Care – PPO | Admitting: Internal Medicine

## 2010-10-25 ENCOUNTER — Encounter: Payer: Self-pay | Admitting: Internal Medicine

## 2010-10-25 ENCOUNTER — Ambulatory Visit (INDEPENDENT_AMBULATORY_CARE_PROVIDER_SITE_OTHER): Payer: BC Managed Care – PPO | Admitting: Internal Medicine

## 2010-10-25 DIAGNOSIS — I2510 Atherosclerotic heart disease of native coronary artery without angina pectoris: Secondary | ICD-10-CM

## 2010-10-25 DIAGNOSIS — I10 Essential (primary) hypertension: Secondary | ICD-10-CM

## 2010-10-25 LAB — HEPATIC FUNCTION PANEL
ALT: 42 U/L (ref 0–53)
AST: 34 U/L (ref 0–37)
Albumin: 4.4 g/dL (ref 3.5–5.2)
Total Bilirubin: 0.7 mg/dL (ref 0.3–1.2)

## 2010-10-25 LAB — CBC WITH DIFFERENTIAL/PLATELET
Basophils Relative: 0.6 % (ref 0.0–3.0)
Eosinophils Absolute: 0.2 10*3/uL (ref 0.0–0.7)
Eosinophils Relative: 2.5 % (ref 0.0–5.0)
Lymphocytes Relative: 24.9 % (ref 12.0–46.0)
Monocytes Absolute: 1.4 10*3/uL — ABNORMAL HIGH (ref 0.1–1.0)
Neutrophils Relative %: 55.9 % (ref 43.0–77.0)
Platelets: 267 10*3/uL (ref 150.0–400.0)
RBC: 5.13 Mil/uL (ref 4.22–5.81)
WBC: 9 10*3/uL (ref 4.5–10.5)

## 2010-10-25 LAB — BASIC METABOLIC PANEL
BUN: 13 mg/dL (ref 6–23)
Calcium: 9.6 mg/dL (ref 8.4–10.5)
Creatinine, Ser: 0.9 mg/dL (ref 0.4–1.5)

## 2010-10-25 LAB — LIPID PANEL
Cholesterol: 132 mg/dL (ref 0–200)
HDL: 38 mg/dL — ABNORMAL LOW (ref >39.00)
Total CHOL/HDL Ratio: 3
Triglycerides: 223 mg/dL — ABNORMAL HIGH (ref 0.0–149.0)

## 2010-10-25 LAB — LDL CHOLESTEROL, DIRECT: Direct LDL: 68.2 mg/dL

## 2010-10-25 MED ORDER — LISINOPRIL 40 MG PO TABS: 40.0000 mg | ORAL_TABLET | Freq: Every day | ORAL | Status: DC

## 2010-10-25 MED ORDER — ROSUVASTATIN CALCIUM 10 MG PO TABS: 10.0000 mg | ORAL_TABLET | Freq: Every day | ORAL | Status: DC

## 2010-10-25 NOTE — Progress Notes (Signed)
  Subjective:    Patient ID: Leonard Edwards, male    DOB: 1955-02-27, 55 y.o.   MRN: [id number]  HPI  Last weekend felt poorly. The week prior has sore throat went to [facility] treated for URI with zpack. Last weekend felt lightheaded/dizzy. He took BP was markedly elevated and he self treated with more lisinopril and metoprolol. BPs since that time have been in 150s/80s.   Past Medical History  Diagnosis Date  . CAD (coronary artery disease)   . Hyperlipidemia   . Hypertension   . GERD (gastroesophageal reflux disease)   . Hiatal hernia   . Gastric polyp   . Esophageal stricture   . Gout   . Benign prostatic hypertrophy    No past surgical history on file.  reports that he has never smoked. He does not have any smokeless tobacco history on file. He reports that he drinks alcohol. His drug history not on file. family history includes Diabetes in his sister; Heart attack in his father; and Lupus in his sister. No Known Allergies   Review of Systems  patient denies chest pain, shortness of breath, orthopnea. Denies lower extremity edema, abdominal pain, change in appetite, change in bowel movements. Patient denies rashes, musculoskeletal complaints. No other specific complaints in a complete review of systems.      Objective:   Physical Exam  well-developed well-nourished male in no acute distress. HEENT exam atraumatic, normocephalic, neck supple without jugular venous distention. Chest clear to auscultation cardiac exam S1-S2 are regular. Abdominal exam overweight with bowel sounds, soft and nontender. Extremities no edema. Neurologic exam is alert with a normal gait.     Assessment & Plan:

## 2010-10-25 NOTE — Assessment & Plan Note (Addendum)
BP Readings from Last 3 Encounters:  10/25/10 146/88  07/09/10 120/80  02/13/10 124/82   Not as well controlled Increase meds Increase lisinopril to 40 mg daily. Side effects discussed.

## 2010-10-25 NOTE — Assessment & Plan Note (Signed)
He has no symptoms Needs better BP control

## 2010-11-11 ENCOUNTER — Other Ambulatory Visit: Payer: Self-pay | Admitting: Internal Medicine

## 2010-11-13 ENCOUNTER — Telehealth: Payer: Self-pay | Admitting: Internal Medicine

## 2010-11-13 NOTE — Telephone Encounter (Signed)
Labs up front ready for p/u, pt aware

## 2010-11-13 NOTE — Telephone Encounter (Signed)
Pt req to get copy of labs mailed to pts home or pt will pick up copy.

## 2010-11-19 ENCOUNTER — Telehealth: Payer: Self-pay | Admitting: Cardiovascular Disease

## 2010-11-19 NOTE — Telephone Encounter (Signed)
Pt calling to schedule earlier appt w/ Dr. Excell Seltzer Tereso Newcomer. Pt BP 195-220/113-120, Pt scheduled to see Dr. Excell Seltzer tomorrow at 4 pm. Two weeks ago Dr. Cato Mulligan doubled pt lisinopril for increased BP. Pt would like to know if we would recommended pt do anything to manage his BP until his appt tomorrow. Please return pt call to discuss further. Pt c/o "dizziness and all sorts of other symptoms".

## 2010-11-19 NOTE — Telephone Encounter (Signed)
I spoke with the pt and he actually ran out of his HCTZ and forgot to get it filled.  I made the pt aware that this medication is for treatment of BP.  The pt will go to the pharmacy now and get this medication.  The pt will come into the office tomorrow to see Dr Excell Seltzer as scheduled.

## 2010-11-20 ENCOUNTER — Ambulatory Visit (INDEPENDENT_AMBULATORY_CARE_PROVIDER_SITE_OTHER): Payer: BC Managed Care – PPO | Admitting: Cardiovascular Disease

## 2010-11-20 ENCOUNTER — Encounter: Payer: Self-pay | Admitting: Cardiovascular Disease

## 2010-11-20 DIAGNOSIS — I2510 Atherosclerotic heart disease of native coronary artery without angina pectoris: Secondary | ICD-10-CM

## 2010-11-20 DIAGNOSIS — E785 Hyperlipidemia, unspecified: Secondary | ICD-10-CM

## 2010-11-20 DIAGNOSIS — I10 Essential (primary) hypertension: Secondary | ICD-10-CM

## 2010-11-20 MED ORDER — METOPROLOL SUCCINATE ER 50 MG PO TB24: 50.0000 mg | ORAL_TABLET | Freq: Two times a day (BID) | ORAL | Status: DC

## 2010-11-20 NOTE — Patient Instructions (Signed)
Your physician recommends that you schedule a follow-up appointment in: 4 WEEKS  Your physician has recommended you make the following change in your medication: INCREASE Metoprolol Succinate to 50mg  twice a day  Bring BP cuff into the office to check accuracy.

## 2010-11-21 ENCOUNTER — Ambulatory Visit: Payer: BC Managed Care – PPO | Admitting: Physician Assistant

## 2010-11-21 ENCOUNTER — Encounter: Payer: Self-pay | Admitting: Cardiovascular Disease

## 2010-11-21 NOTE — Assessment & Plan Note (Signed)
We had a long discussion today about the patient's hypertension. I reviewed his weight and compared to last year's office visit when he weighed 230 pounds. He's had a 16 pound weight gain. I think this is probably playing a significant impact at all of this. He is now requiring more medication and continues to have suboptimal control. Recommended a strong effort at weight loss, increased exercise, and salt restriction in his diet. We reviewed the impact of alcohol complained that he does not drink much. I have also recommended that he increase his Toprol-XL to 50 mg twice daily. He will continue on lisinopril and hydrochlorothiazide. I would predict that if he can get his way back down to around 220 pounds he will probably be able to discontinue some of this medication.

## 2010-11-21 NOTE — Progress Notes (Signed)
HPI:  Leonard Edwards is a 55 year old gentleman presented for followup evaluation. He is followed for hypertension and coronary artery disease. He developed progressive angina last year and was found to have severe stenosis of the ramus intermedius treated with a drug-eluting stent. He has had no problems with recurrent angina since that time. However, he recently has developed severe hypertension. He has previously been treated with long-acting metoprolol alone and has had good blood pressure control. He now reports blood pressure readings greater than 200/100. His antihypertensive medication has been increased and he is taking lisinopril in addition. He has called in to the office we have tried to make some adjustments. He has also been seen by Dr. Cato Mulligan who has increased his medication. One thing he realizes that he has not been taking his hydrochlorothiazide which is one of his prescribed medications. He started back on this yesterday. The patient complains of headaches, dizziness, and marked fatigue. He denies chest pain or chest pressure. He has not been restricting salt in his diet and also has fallen out of his regular exercise program.  Outpatient Encounter Prescriptions as of 11/20/2010  Medication Sig Dispense Refill  . allopurinol (ZYLOPRIM) 300 MG tablet TAKE ONE TABLET EVERY DAY  90 tablet  1  . aspirin 325 MG tablet Take 325 mg by mouth daily.        Marland Kitchen aspirin 81 MG tablet Take 81 mg by mouth daily.        . Cholecalciferol (VITAMIN D3) 1000 UNITS CAPS Take 1,000 Units by mouth daily.        Marland Kitchen CIALIS 20 MG tablet TAKE ONE TABLET DAILY AS NEEDED FOR     ERECTILE DYSFUNCTION  10 tablet  5  . clopidogrel (PLAVIX) 75 MG tablet Take 75 mg by mouth daily.        . Coenzyme Q10 (CO Q 10) 100 MG CAPS Take 100 mg by mouth daily.        . famotidine (PEPCID) 20 MG tablet Take 20 mg by mouth at bedtime as needed.       . fluticasone (FLONASE) 50 MCG/ACT nasal spray Place 2 sprays into the nose daily.   16 g  11  . hydrochlorothiazide 25 MG tablet TAKE ONE TABLET EVERY DAY  30 tablet  5  . lisinopril (PRINIVIL,ZESTRIL) 40 MG tablet Take 1 tablet (40 mg total) by mouth daily.  30 tablet  5  . metoprolol (TOPROL-XL) 50 MG 24 hr tablet Take 1 tablet (50 mg total) by mouth 2 (two) times daily.  60 tablet  6  . omega-3 acid ethyl esters (LOVAZA) 1 G capsule        . rosuvastatin (CRESTOR) 10 MG tablet Take 1 tablet (10 mg total) by mouth daily.  90 tablet  3  . testosterone cypionate (DEPOTESTOTERONE CYPIONATE) 200 MG/ML injection Inject 200 mg into the muscle every 28 (twenty-eight) days.        Marland Kitchen DISCONTD: LOVAZA 1 G capsule TAKE ONE CAPSULE TWICE A DAY  60 each  5  . DISCONTD: metoprolol (TOPROL-XL) 50 MG 24 hr tablet Take 50 mg by mouth daily.        Marland Kitchen DISCONTD: cetirizine (ZYRTEC) 10 MG tablet Take 10 mg by mouth daily.         Facility-Administered Encounter Medications as of 11/20/2010  Medication Dose Route Frequency Provider Last Rate Last Dose  . testosterone cypionate (DEPOTESTOTERONE CYPIONATE) injection 200 mg  200 mg Intramuscular Q28 days Judie Petit, MD  200 mg at 07/09/10 1411    No Known Allergies  Past Medical History  Diagnosis Date  . CAD (coronary artery disease)   . Hyperlipidemia   . Hypertension   . GERD (gastroesophageal reflux disease)   . Hiatal hernia   . Gastric polyp   . Esophageal stricture   . Gout   . Benign prostatic hypertrophy     ROS: Negative except as per HPI  BP 144/87  Pulse 63  Ht 5\' 10"  (1.778 m)  Wt 246 lb (111.585 kg)  BMI 35.30 kg/m2  PHYSICAL EXAM: Pt is alert and oriented, overweight male in NAD HEENT: normal Neck: JVP - normal, carotids 2+= without bruits Lungs: CTA bilaterally CV: RRR without murmur or gallop Abd: soft, NT, Positive BS, no hepatomegaly Ext: no C/C/E, distal pulses intact and equal Skin: warm/dry no rash  EKG:  Normal sinus rhythm 63 beats per minute, within normal limits.  ASSESSMENT AND  PLAN:

## 2010-11-21 NOTE — Assessment & Plan Note (Signed)
Lipids reviewed and they were recently drawn. LDL is less than 70. He is on statin therapy.

## 2010-11-21 NOTE — Assessment & Plan Note (Signed)
The patient is stable without angina. He is greater than 12 months out from his last PCI procedure. He continues on dual antiplatelet therapy with aspirin and Plavix.

## 2010-12-18 ENCOUNTER — Other Ambulatory Visit: Payer: Self-pay | Admitting: Internal Medicine

## 2010-12-23 ENCOUNTER — Ambulatory Visit: Payer: BC Managed Care – PPO | Admitting: Cardiovascular Disease

## 2011-01-14 ENCOUNTER — Encounter: Payer: Self-pay | Admitting: Cardiovascular Disease

## 2011-01-14 ENCOUNTER — Ambulatory Visit (INDEPENDENT_AMBULATORY_CARE_PROVIDER_SITE_OTHER): Payer: BC Managed Care – PPO | Admitting: Cardiovascular Disease

## 2011-01-14 DIAGNOSIS — I2510 Atherosclerotic heart disease of native coronary artery without angina pectoris: Secondary | ICD-10-CM

## 2011-01-14 DIAGNOSIS — I10 Essential (primary) hypertension: Secondary | ICD-10-CM

## 2011-01-14 NOTE — Assessment & Plan Note (Signed)
Much better control. Continue current medications.

## 2011-01-14 NOTE — Progress Notes (Signed)
HPI:  55 year old gentleman presenting for followup evaluation. The patient has coronary artery disease and hypertension. He was seen in October and his antihypertensive medications were adjusted. He has also been followed closely by Dr. Cato Mulligan who has had an increase his antihypertensive medicines as well. The patient has coronary disease and underwent stenting of the ramus intermedius branch last year. He had previously undergone stenting of the right coronary artery.  The patient has initiated an exercise program since I have seen him last one month ago. His blood pressure is better controlled after medication adjustments were made. He has been exercising regularly and has no symptoms with exertion. He specifically denies chest pain or pressure, dyspnea, palpitations, lightheadedness, or syncope. He has been compliant with his medications.  He continues to have complaints of vertigo when lying down in bed. He has been evaluated by his ENT physician but there is no clear cause of this. He has occasional night sweats. He complains of generalized fatigue. He has no other specific complaints today.  Outpatient Encounter Prescriptions as of 01/14/2011  Medication Sig Dispense Refill  . allopurinol (ZYLOPRIM) 300 MG tablet TAKE ONE TABLET EVERY DAY  90 tablet  1  . aspirin 81 MG tablet Take 81 mg by mouth daily.        . Cholecalciferol (VITAMIN D3) 1000 UNITS CAPS Take 1,000 Units by mouth daily.        Marland Kitchen CIALIS 20 MG tablet TAKE ONE TABLET DAILY AS NEEDED FOR     ERECTILE DYSFUNCTION  10 tablet  5  . clopidogrel (PLAVIX) 75 MG tablet Take 75 mg by mouth daily.        . Coenzyme Q10 (CO Q 10) 100 MG CAPS Take 100 mg by mouth daily.        . famotidine (PEPCID) 20 MG tablet Take 20 mg by mouth at bedtime as needed.       . fluticasone (FLONASE) 50 MCG/ACT nasal spray Place 2 sprays into the nose daily.  16 g  11  . hydrochlorothiazide 25 MG tablet TAKE ONE TABLET EVERY DAY  30 tablet  5  . lisinopril  (PRINIVIL,ZESTRIL) 40 MG tablet Take 1 tablet (40 mg total) by mouth daily.  30 tablet  5  . LOVAZA 1 G capsule TAKE ONE CAPSULE TWICE A DAY  60 each  5  . metoprolol (TOPROL-XL) 50 MG 24 hr tablet Take 1 tablet (50 mg total) by mouth 2 (two) times daily.  60 tablet  6  . rosuvastatin (CRESTOR) 10 MG tablet Take 1 tablet (10 mg total) by mouth daily.  90 tablet  3  . testosterone cypionate (DEPOTESTOTERONE CYPIONATE) 200 MG/ML injection Inject 200 mg into the muscle every 28 (twenty-eight) days. ( not taking )      . DISCONTD: aspirin 325 MG tablet Take 325 mg by mouth daily.        Marland Kitchen DISCONTD: omega-3 acid ethyl esters (LOVAZA) 1 G capsule         Facility-Administered Encounter Medications as of 01/14/2011  Medication Dose Route Frequency Provider Last Rate Last Dose  . testosterone cypionate (DEPOTESTOTERONE CYPIONATE) injection 200 mg  200 mg Intramuscular Q28 days Judie Petit, MD   200 mg at 07/09/10 1411    No Known Allergies  Past Medical History  Diagnosis Date  . CAD (coronary artery disease)   . Hyperlipidemia   . Hypertension   . GERD (gastroesophageal reflux disease)   . Hiatal hernia   . Gastric  polyp   . Esophageal stricture   . Gout   . Benign prostatic hypertrophy     ROS: Negative except as per HPI  BP 110/68  Pulse 86  Ht 5\' 10"  (1.778 m)  Wt 112.038 kg (247 lb)  BMI 35.44 kg/m2  PHYSICAL EXAM: Blood pressure on my check was 130/82 Pt is alert and oriented, NAD HEENT: normal Neck: JVP - normal, carotids 2+= without bruits Lungs: CTA bilaterally CV: RRR without murmur or gallop Abd: soft, NT, Positive BS, no hepatomegaly Ext: no C/C/E, distal pulses intact and equal Skin: warm/dry no rash  ASSESSMENT AND PLAN:

## 2011-01-14 NOTE — Patient Instructions (Signed)
Your physician wants you to follow-up in:  6 months. You will receive a reminder letter in the mail two months in advance. If you don't receive a letter, please call our office to schedule the follow-up appointment.   

## 2011-01-14 NOTE — Assessment & Plan Note (Signed)
The patient is stable without angina. He will continue on his current medical program. I would like to see him back in 6 months for followup. We had a long discussion about the continued importance of maintaining an exercise program. He is going to focus on significant weight loss over the next several months.

## 2011-02-05 ENCOUNTER — Telehealth: Payer: Self-pay

## 2011-02-05 NOTE — Telephone Encounter (Signed)
Spoke with [facility] and they will fill the rx.

## 2011-02-05 NOTE — Telephone Encounter (Signed)
Yes fill the prescription. There is no definitive data to suggest a drug interaction. Actually, data to suggest that there is no interaction. In any event, he can take his omeprazole in the morning 30 minutes before the first meal and his Plavix at bedtime. Thanks

## 2011-02-05 NOTE — Telephone Encounter (Signed)
Pt was given a prescription 01/29/11 for Omeprazole 40mg  #30 1 every am with 11 refills. Per the pharmacist the pt is on Plavix and there is a possible drug interaction with the Omeprazole and the Plavix. Pharmacy wants to know if Dr. Marina Goodell wants to proceed with them filling this prescription. Dr. Marina Goodell please advise.

## 2011-03-04 ENCOUNTER — Other Ambulatory Visit: Payer: Self-pay | Admitting: Internal Medicine

## 2011-03-17 ENCOUNTER — Telehealth: Payer: Self-pay

## 2011-03-17 NOTE — Telephone Encounter (Signed)
Message copied by Michele Mcalpine on Mon Mar 17, 2011  3:05 PM ------      Message from: Hilarie Fredrickson      Created: Mon Mar 17, 2011  1:08 PM      Regarding: appointment       Bonita Quin,            Mr. Okimoto wants to establish with a new primary care physician. Please refer him to Dr. Roseanne Kaufman at [facility]. Mr. Kintz can be reached on his cell [phone]. Thanks. Dr. Marina Goodell

## 2011-03-17 NOTE — Telephone Encounter (Signed)
Message left for new pt coordinator for appt with Dr. Clelia Croft.

## 2011-03-18 NOTE — Telephone Encounter (Signed)
Spoke with New pt coordinator. Pt scheduled to see Dr. Clelia Croft 08/05/11 arrival time 9am for a 9:30am appt. Called and spoke with pt. Pt states he will think about it and my look around. Does not really want to wait that long.

## 2011-03-18 NOTE — Telephone Encounter (Signed)
Call patient coordinator for me. Let her know that patient has significant medical problems and would like to be seen sooner (than 5 months...) if at all possible. Also, a patient and friend of mine and I would appreciate a sooner appt. If I need to speak with Dr Clelia Croft, I will, but hopefully she can help

## 2011-03-19 NOTE — Telephone Encounter (Signed)
Pt scheduled to see Dr. Clelia Croft 03/31/11 arrival time 11am for an 11:30am appointment. Left message for pt to call back.

## 2011-03-19 NOTE — Telephone Encounter (Signed)
Called and left message for Chyrl Civatte that we would like a sooner appt for the pt. Waiting on a return call.

## 2011-03-21 NOTE — Telephone Encounter (Signed)
Left message for pt to call back on 03/20/11. Left message for pt to call back today.

## 2011-03-25 NOTE — Telephone Encounter (Signed)
Unable to reach patient by phone. Multiple messages left for pt to call back. Letter mailed to patient regarding appt with Dr. Clelia Croft. Dr. Marina Goodell aware.

## 2011-04-07 ENCOUNTER — Other Ambulatory Visit: Payer: Self-pay | Admitting: Internal Medicine

## 2011-07-09 ENCOUNTER — Other Ambulatory Visit: Payer: Self-pay | Admitting: Internal Medicine

## 2011-07-15 ENCOUNTER — Encounter: Payer: Self-pay | Admitting: Cardiovascular Disease

## 2011-09-24 ENCOUNTER — Other Ambulatory Visit: Payer: Self-pay | Admitting: Internal Medicine

## 2011-11-03 ENCOUNTER — Other Ambulatory Visit: Payer: Self-pay | Admitting: Internal Medicine

## 2011-11-07 ENCOUNTER — Telehealth: Payer: Self-pay

## 2011-11-07 ENCOUNTER — Telehealth: Payer: Self-pay | Admitting: Internal Medicine

## 2011-11-07 MED ORDER — AZITHROMYCIN 1 G PO PACK: 1.0000 | PACK | Freq: Once | ORAL | Status: DC

## 2011-11-07 NOTE — Telephone Encounter (Signed)
rx sent

## 2011-11-07 NOTE — Telephone Encounter (Signed)
Patient called c/o colored sinus drainage with pain. Has hx recurrent sinusitis. Will have nurse call in Z-Pak (take as directed; no refills) to [facility]

## 2011-11-07 NOTE — Telephone Encounter (Signed)
Chales Abrahams, pharmacist at [facility], called to clarify an rx sent by Richarda Blade this morning.  The Rx for Zithromax was sent as a power, not a  Pill pack.  I spoke with Lavonna Rua and clarified that this was a mistake, and the Rx was supposed to be for a regular Z-pack (pills).  I clarified this to Chales Abrahams who, said she would correct the rx accordingly

## 2011-11-07 NOTE — Addendum Note (Signed)
Addended by: Annett Fabian on: 11/07/2011 11:15 AM   Modules accepted: Orders

## 2011-11-11 ENCOUNTER — Ambulatory Visit (INDEPENDENT_AMBULATORY_CARE_PROVIDER_SITE_OTHER): Payer: BC Managed Care – PPO | Admitting: Cardiovascular Disease

## 2011-11-11 ENCOUNTER — Encounter: Payer: Self-pay | Admitting: Cardiovascular Disease

## 2011-11-11 VITALS — BP 132/88 | HR 72 | Ht 70.0 in | Wt 249.0 lb

## 2011-11-11 DIAGNOSIS — R0602 Shortness of breath: Secondary | ICD-10-CM

## 2011-11-11 DIAGNOSIS — I25810 Atherosclerosis of coronary artery bypass graft(s) without angina pectoris: Secondary | ICD-10-CM

## 2011-11-11 NOTE — Progress Notes (Signed)
HPI:  56 year old gentleman presenting for followup evaluation. The patient is followed for coronary artery disease and hypertension. He underwent stenting of the ramus intermedius branch and 2011. He has previously undergone stenting of the right coronary artery. Drug-eluting stent platforms have been used. Lipids were last checked in June 2013 showed a total cholesterol of 153, triglycerides 404, HDL 26, and LDL 46.  The patient continues to have a lot of symptoms. His main complaint is that when he lies down at night he continues to feel dizzy. He has pain in his neck, shoulders, and down his arms. He does not have postural dizziness with standing. He denies lightheadedness or syncope. He's had no chest pain or pressure. He has occasional dyspnea but nothing consistently related to exertion. He denies orthopnea, PND, or leg swelling. He exercises sporadically and tells me that he did the elliptical recently for 60 minutes and felt fine. He does not have chest pain or pressure with exertion.  Outpatient Encounter Prescriptions as of 11/11/2011  Medication Sig Dispense Refill  . allopurinol (ZYLOPRIM) 300 MG tablet       . aspirin 81 MG tablet Take 81 mg by mouth daily.        . Cholecalciferol (VITAMIN D3) 1000 UNITS CAPS Take 1,000 Units by mouth daily.        Marland Kitchen CIALIS 20 MG tablet TAKE ONE TABLET DAILY AS NEEDED FOR     ERECTILE DYSFUNCTION  10 tablet  5  . clopidogrel (PLAVIX) 75 MG tablet Take 75 mg by mouth daily.        . Coenzyme Q10 (CO Q 10) 100 MG CAPS Take 100 mg by mouth daily.        . famotidine (PEPCID) 20 MG tablet Take 20 mg by mouth at bedtime as needed.       . fluticasone (FLONASE) 50 MCG/ACT nasal spray TWO SPRAYS DAILY IN NOSE  16 g  2  . hydrochlorothiazide (HYDRODIURIL) 25 MG tablet TAKE ONE TABLET EVERY DAY  30 tablet  5  . lisinopril (PRINIVIL,ZESTRIL) 40 MG tablet TAKE ONE TABLET EACH DAY  30 tablet  2  . LOVAZA 1 G capsule TAKE ONE CAPSULE TWICE A DAY  60 each  5  .  metoprolol (TOPROL-XL) 50 MG 24 hr tablet Take 1 tablet (50 mg total) by mouth 2 (two) times daily.  60 tablet  6  . rosuvastatin (CRESTOR) 10 MG tablet Take 1 tablet (10 mg total) by mouth daily.  90 tablet  3  . testosterone cypionate (DEPOTESTOTERONE CYPIONATE) 200 MG/ML injection Inject 200 mg into the muscle every 28 (twenty-eight) days. ( not taking )      . DISCONTD: allopurinol (ZYLOPRIM) 300 MG tablet TAKE ONE TABLET EVERY DAY  90 tablet  1  . DISCONTD: azithromycin (ZITHROMAX) 1 G powder Take 1 packet by mouth once.  1 each  0   Facility-Administered Encounter Medications as of 11/11/2011  Medication Dose Route Frequency Provider Last Rate Last Dose  . testosterone cypionate (DEPOTESTOTERONE CYPIONATE) injection 200 mg  200 mg Intramuscular Q28 days Lindley Magnus, MD   200 mg at 07/09/10 1411    No Known Allergies  Past Medical History  Diagnosis Date  . CAD (coronary artery disease)   . Hyperlipidemia   . Hypertension   . GERD (gastroesophageal reflux disease)   . Hiatal hernia   . Gastric polyp   . Esophageal stricture   . Gout   . Benign prostatic hypertrophy  ROS: Negative except as per HPI  BP 132/88  Pulse 72  Ht 5\' 10"  (1.778 m)  Wt 112.946 kg (249 lb)  BMI 35.73 kg/m2  PHYSICAL EXAM: Pt is alert and oriented, pleasant overweight male in NAD HEENT: normal Neck: JVP - normal, carotids 2+= without bruits Lungs: CTA bilaterally CV: RRR without murmur or gallop Abd: soft, NT, Positive BS, no hepatomegaly Ext: no C/C/E, distal pulses intact and equal Skin: warm/dry no rash  EKG:  Normal sinus rhythm 72 beats per minute, within normal limits.  ASSESSMENT AND PLAN: 1. Coronary artery disease, native vessel. The patient is having no symptoms of typical angina. He does have shortness of breath at times. He has undergone 2 vessel PCI in different settings. I have recommended an exercise treadmill study, non-imaging, for CAD surveillance. He has remained on  long-term dual antiplatelet therapy in the setting of a first generation drug-eluting stent.  2. Essential hypertension. Continue current medical program. Need to work on lifestyle modification we reviewed this.  3. Hyperlipidemia. Lipids were reviewed and diet and exercise strategies were recommended.  The patient requests an "executive physical." He is looking into [employer].   Tonny Bollman 11/11/2011 2:54 PM

## 2011-11-11 NOTE — Patient Instructions (Addendum)
Your physician has requested that you have an exercise tolerance test with Dr cooper please .  Please also follow instruction sheet, as given.   Your physician wants you to follow-up in: 6 months  You will receive a reminder letter in the mail two months in advance. If you don't receive a letter, please call our office to schedule the follow-up appointment.

## 2011-12-08 ENCOUNTER — Other Ambulatory Visit: Payer: Self-pay | Admitting: Internal Medicine

## 2011-12-17 ENCOUNTER — Ambulatory Visit (INDEPENDENT_AMBULATORY_CARE_PROVIDER_SITE_OTHER): Payer: BC Managed Care – PPO | Admitting: Cardiovascular Disease

## 2011-12-17 DIAGNOSIS — R0602 Shortness of breath: Secondary | ICD-10-CM

## 2011-12-17 DIAGNOSIS — I25810 Atherosclerosis of coronary artery bypass graft(s) without angina pectoris: Secondary | ICD-10-CM

## 2011-12-17 NOTE — Patient Instructions (Addendum)
Your physician wants you to follow-up in: February 2014 with Dr. Excell Seltzer.  You will receive a reminder letter in the mail two months in advance. If you don't receive a letter, please call our office to schedule the follow-up appointment.

## 2011-12-17 NOTE — Progress Notes (Signed)
Exercise Treadmill Test  Pre-Exercise Testing Evaluation Rhythm: normal sinus  Rate: 68   PR:  .16 QRS:  .09  QT:  .42 QTc: .45           Test  Exercise Tolerance Test Ordering MD: Tonny Bollman, MD  Interpreting MD: Tonny Bollman, MD  Unique Test No: 1  Treadmill:  1  Indication for ETT: exertional dyspnea  Contraindication to ETT: No   Stress Modality: exercise - treadmill  Cardiac Imaging Performed: non   Protocol: standard Bruce - maximal  Max BP:  215/84  Max MPHR (bpm):  165 85% MPR (bpm):  140  MPHR obtained (bpm):  141 % MPHR obtained:  85  Reached 85% MPHR (min:sec):  8:50 Total Exercise Time (min-sec):  9:01  Workload in METS:  10.1 Borg Scale: 15  Reason ETT Terminated:  fatigue    ST Segment Analysis At Rest: normal ST segments - no evidence of significant ST depression With Exercise: no evidence of significant ST depression  Other Information Arrhythmia:  No Angina during ETT:  absent (0) Quality of ETT:  diagnostic  ETT Interpretation:  normal - no evidence of ischemia by ST analysis  Comments: 1. Good exercise tolerance without angina, significant ST segment change, or arrhythmia with exertion 2. Hypertensive blood pressure response to exercise  Recommendations: Aggressive lifestyle modification with focus on weight loss and initiation of an exercise program. Followup office visit in 3 months to assess progress. No medication changes.

## 2012-01-26 ENCOUNTER — Other Ambulatory Visit: Payer: Self-pay | Admitting: Internal Medicine

## 2012-02-13 ENCOUNTER — Other Ambulatory Visit: Payer: Self-pay | Admitting: Internal Medicine

## 2012-03-02 ENCOUNTER — Telehealth: Payer: Self-pay

## 2012-03-02 MED ORDER — AMOXICILLIN 500 MG PO CAPS: 500.0000 mg | ORAL_CAPSULE | Freq: Two times a day (BID) | ORAL | Status: DC

## 2012-03-02 NOTE — Telephone Encounter (Signed)
Message copied by Chrystie Nose on Tue Mar 02, 2012  1:09 PM ------      Message from: Hilarie Fredrickson      Created: Tue Mar 02, 2012  1:01 PM      Regarding: antibiotic       Patient called with acute sinusitis. Please call in amoxicillin 500 mg; #20; one po bid; no refills. Call to [facility]

## 2012-03-02 NOTE — Telephone Encounter (Signed)
Script sent to the pharmacy. Pt aware.

## 2012-03-06 ENCOUNTER — Other Ambulatory Visit: Payer: Self-pay | Admitting: Internal Medicine

## 2012-03-29 ENCOUNTER — Other Ambulatory Visit: Payer: Self-pay | Admitting: Cardiovascular Disease

## 2012-04-09 ENCOUNTER — Ambulatory Visit: Payer: BC Managed Care – PPO | Admitting: Cardiovascular Disease

## 2012-04-13 ENCOUNTER — Encounter: Payer: Self-pay | Admitting: Cardiovascular Disease

## 2012-05-11 ENCOUNTER — Ambulatory Visit (INDEPENDENT_AMBULATORY_CARE_PROVIDER_SITE_OTHER): Payer: BC Managed Care – PPO | Admitting: Cardiovascular Disease

## 2012-05-11 ENCOUNTER — Encounter: Payer: Self-pay | Admitting: Cardiovascular Disease

## 2012-05-11 VITALS — BP 138/90 | HR 52 | Ht 70.0 in | Wt 253.0 lb

## 2012-05-11 DIAGNOSIS — I2510 Atherosclerotic heart disease of native coronary artery without angina pectoris: Secondary | ICD-10-CM

## 2012-05-11 NOTE — Patient Instructions (Addendum)
Your physician has requested that you have an exercise tolerance test in 6 MONTHS with Dr Excell Seltzer. For further information please visit [email or web address]  You will receive a reminder letter in the mail two months in advance. If you don't receive a letter, please call our office to schedule the follow-up appointment.  Your physician recommends that you continue on your current medications as directed. Please refer to the Current Medication list given to you today.

## 2012-05-11 NOTE — Progress Notes (Signed)
HPI:  30 her old gentleman presented for followup evaluation. He's followed for coronary artery disease and hypertension. He initially presented several years ago with acute coronary syndrome and underwent PCI to right coronary artery. He developed recurrent anginal symptoms and underwent stenting of the ramus intermedius branch and 2011. I saw him last in October and his primary complaint at that time was related to dizziness. He has not had exertional symptoms. He had an exercise treadmill study in November where he exercised for 9 minutes according to the Bruce protocol without angina or ST changes. Lipids from June 2013 showed a cholesterol of 143, HDL 26, LDL 46. Triglycerides were elevated at greater than 400.  He continues to have some problems with his neck. He has arm pain in the middle of the night. He has some dizziness. He denies chest pain or pressure, dyspnea, or edema. He has no palpitations or syncope. He's been working very long hours and has been busy at home with family issues as well. He feels like he is physically "in a rut."  Outpatient Encounter Prescriptions as of 05/11/2012  Medication Sig Dispense Refill  . allopurinol (ZYLOPRIM) 300 MG tablet       . aspirin 81 MG tablet Take 81 mg by mouth daily.        . Cholecalciferol (VITAMIN D3) 1000 UNITS CAPS Take 1,000 Units by mouth daily.        Marland Kitchen CIALIS 20 MG tablet TAKE ONE TABLET DAILY AS NEEDED FOR     ERECTILE DYSFUNCTION  10 tablet  5  . Coenzyme Q10 (CO Q 10) 100 MG CAPS Take 100 mg by mouth daily.        . fluticasone (FLONASE) 50 MCG/ACT nasal spray TWO SPRAYS DAILY IN NOSE  16 g  0  . hydrochlorothiazide (HYDRODIURIL) 25 MG tablet TAKE ONE TABLET EACH DAY  30 tablet  0  . lisinopril (PRINIVIL,ZESTRIL) 40 MG tablet TAKE ONE TABLET EACH DAY  30 tablet  2  . LOVAZA 1 G capsule TAKE ONE CAPSULE TWICE A DAY  60 each  0  . metoprolol succinate (TOPROL-XL) 50 MG 24 hr tablet TAKE ONE TABLET TWICE DAILY  60 tablet  3  .  omeprazole (PRILOSEC) 40 MG capsule TAKE ONE CAPSULE EVERY MORNING  30 capsule  3  . rosuvastatin (CRESTOR) 10 MG tablet Take 1 tablet (10 mg total) by mouth daily.  90 tablet  3  . clopidogrel (PLAVIX) 75 MG tablet Take 75 mg by mouth daily.        . [DISCONTINUED] amoxicillin (AMOXIL) 500 MG capsule Take 1 capsule (500 mg total) by mouth 2 (two) times daily.  20 capsule  0  . [DISCONTINUED] famotidine (PEPCID) 20 MG tablet Take 20 mg by mouth at bedtime as needed.       . [DISCONTINUED] testosterone cypionate (DEPOTESTOTERONE CYPIONATE) 200 MG/ML injection Inject 200 mg into the muscle every 28 (twenty-eight) days. ( not taking )      . [DISCONTINUED] testosterone cypionate (DEPOTESTOTERONE CYPIONATE) injection 200 mg        No facility-administered encounter medications on file as of 05/11/2012.    No Known Allergies  Past Medical History  Diagnosis Date  . CAD (coronary artery disease)   . Hyperlipidemia   . Hypertension   . GERD (gastroesophageal reflux disease)   . Hiatal hernia   . Gastric polyp   . Esophageal stricture   . Gout   . Benign prostatic hypertrophy  ROS: Negative except as per HPI  BP 138/90  Pulse 52  Ht 5\' 10"  (1.778 m)  Wt 114.76 kg (253 lb)  BMI 36.3 kg/m2  SpO2 93%  PHYSICAL EXAM: Pt is alert and oriented, obese male in NAD HEENT: normal Neck: JVP - normal, carotids 2+= without bruits Lungs: CTA bilaterally CV: RRR without murmur or gallop Abd: soft, NT, Positive BS, no hepatomegaly Ext: no C/C/E, distal pulses intact and equal Skin: warm/dry no rash  EKG:  Sinus bradycardia 52 beats per minute, early repolarization, otherwise within normal limits the  ASSESSMENT AND PLAN: 1. Coronary artery disease, native vessel. The patient is stable without anginal symptoms. He has discontinued Plavix about 3 months ago. His last PCI procedure was in 2011 I think it is reasonable to continue on monotherapy with aspirin.  2. Hypertension with borderline  control. His medications were reviewed and he will continue on hydrochlorothiazide, lisinopril, and metoprolol succinate. His main focus needs to be on weight loss. We discussed this at length today. He exercises but has very poor eating habits. He is going to work on this.  3. Hyperlipidemia. He continues on a combination of Crestor and Lovaza. Lipids reviewed from Dr. Alver Fisher office with a cholesterol of 153, triglycerides 404, HDL 26, and LDL 46. This reflects metabolic syndrome and he understands lifestyle modification is imperative.  For followup, I will see him back for an exercise treadmill study in about 6 months.  Tonny Bollman 05/11/2012 5:53 PM

## 2012-05-21 ENCOUNTER — Other Ambulatory Visit: Payer: Self-pay | Admitting: Internal Medicine

## 2012-08-23 ENCOUNTER — Telehealth: Payer: Self-pay | Admitting: Internal Medicine

## 2012-08-23 NOTE — Telephone Encounter (Signed)
Patient (and friend) called last Sunday (08-15-12) with acute low back pain and radicular symptoms on left side. Unable to ambulate due to pain. His neurologist recently retired. I went to his home and examined him. Pain w/o weakness with left leg raise. Symptoms c/w acute disk. Treated with prednisone pack and prescribed limited Vicodin for pain. I followed up a few days later and he was much better. He will reestablish with a new neurologist.

## 2012-09-21 ENCOUNTER — Other Ambulatory Visit: Payer: Self-pay | Admitting: Internal Medicine

## 2012-10-25 ENCOUNTER — Other Ambulatory Visit: Payer: Self-pay | Admitting: Cardiovascular Disease

## 2012-12-02 ENCOUNTER — Encounter: Payer: Self-pay | Admitting: Cardiovascular Disease

## 2012-12-14 ENCOUNTER — Encounter: Payer: Self-pay | Admitting: Internal Medicine

## 2012-12-24 ENCOUNTER — Telehealth: Payer: Self-pay

## 2012-12-24 NOTE — Telephone Encounter (Signed)
Message copied by Chrystie Nose on Fri Dec 24, 2012  4:58 PM ------      Message from: Hilarie Fredrickson      Created: Fri Dec 24, 2012  4:21 PM      Regarding: Colonoscopy       Yolanda Manges Mr. General and let him know that I have reviewed his outside blood work and stool study. He has Hemoccult-positive stool. It has been 5 years since his last colonoscopy. He needs outpatient colonoscopy, at his convenience, in the [facility]. He should STAY ON Plavix for the examination given his cardiac history. Please convert to a phone note. Thank you. Dr. Marina Goodell ------

## 2012-12-27 ENCOUNTER — Other Ambulatory Visit: Payer: Self-pay | Admitting: Internal Medicine

## 2012-12-27 DIAGNOSIS — G8929 Other chronic pain: Secondary | ICD-10-CM

## 2012-12-27 NOTE — Telephone Encounter (Signed)
Spoke with pt and he states he has been off of Plavix for about a year now, not on anticoagulants now. Pt scheduled for previsit and Colonoscopy. OV that was scheduled with Dr. Marina Goodell cancelled. Pt aware of all appts.

## 2013-01-05 ENCOUNTER — Other Ambulatory Visit: Payer: BC Managed Care – PPO

## 2013-01-11 ENCOUNTER — Ambulatory Visit
Admission: RE | Admit: 2013-01-11 | Discharge: 2013-01-11 | Disposition: A | Discharge status: Home / Self Care | Payer: BC Managed Care – PPO | Source: Ambulatory Visit | Attending: Internal Medicine | Admitting: Internal Medicine

## 2013-01-11 DIAGNOSIS — G8929 Other chronic pain: Secondary | ICD-10-CM

## 2013-01-12 ENCOUNTER — Ambulatory Visit (AMBULATORY_SURGERY_CENTER): Payer: BC Managed Care – PPO

## 2013-01-12 ENCOUNTER — Encounter: Payer: Self-pay | Admitting: Internal Medicine

## 2013-01-12 VITALS — Ht 70.0 in | Wt 251.0 lb

## 2013-01-12 DIAGNOSIS — R195 Other fecal abnormalities: Secondary | ICD-10-CM

## 2013-01-12 MED ORDER — MOVIPREP 100 G PO SOLR: 1.0000 | Freq: Once | ORAL | Status: DC

## 2013-01-14 ENCOUNTER — Ambulatory Visit: Payer: BC Managed Care – PPO

## 2013-01-14 ENCOUNTER — Ambulatory Visit (INDEPENDENT_AMBULATORY_CARE_PROVIDER_SITE_OTHER): Payer: BC Managed Care – PPO | Admitting: Emergency Medicine

## 2013-01-14 VITALS — BP 146/86 | HR 86 | Temp 98.5°F | Resp 16 | Ht 70.0 in | Wt 258.4 lb

## 2013-01-14 DIAGNOSIS — R05 Cough: Secondary | ICD-10-CM

## 2013-01-14 DIAGNOSIS — R059 Cough, unspecified: Secondary | ICD-10-CM

## 2013-01-14 DIAGNOSIS — J029 Acute pharyngitis, unspecified: Secondary | ICD-10-CM

## 2013-01-14 DIAGNOSIS — R0989 Other specified symptoms and signs involving the circulatory and respiratory systems: Secondary | ICD-10-CM

## 2013-01-14 LAB — POCT CBC
HCT, POC: 49.7 % (ref 43.5–53.7)
Lymph, poc: 2.5 (ref 0.6–3.4)
MCHC: 32.2 g/dL (ref 31.8–35.4)
MCV: 96.3 fL (ref 80–97)
MID (cbc): 0.9 (ref 0–0.9)
POC Granulocyte: 6.6 (ref 2–6.9)
POC LYMPH PERCENT: 24.8 %L (ref 10–50)
POC MID %: 9 %M (ref 0–12)
Platelet Count, POC: 297 10*3/uL (ref 142–424)
RDW, POC: 13.5 %
WBC: 9.9 10*3/uL (ref 4.6–10.2)

## 2013-01-14 LAB — POCT INFLUENZA A/B: Influenza A, POC: NEGATIVE

## 2013-01-14 MED ORDER — HYDROCODONE-HOMATROPINE 5-1.5 MG/5ML PO SYRP: 5.0000 mL | ORAL_SOLUTION | Freq: Three times a day (TID) | ORAL | Status: DC | PRN

## 2013-01-14 MED ORDER — AMOXICILLIN-POT CLAVULANATE 875-125 MG PO TABS: 1.0000 | ORAL_TABLET | Freq: Two times a day (BID) | ORAL | Status: DC

## 2013-01-14 NOTE — Progress Notes (Addendum)
Subjective:  This chart was scribed for Lesle Chris, MD by Carl Best, Medical Scribe. This patient was seen in Room 14 and the patient's care was started at 9:06 AM.   Patient ID: Leonard Edwards, male    DOB: 06/30/1955, 57 y.o.   MRN: [id number]  HPI HPI Comments: Leonard Edwards is a 57 y.o. male who presents to the [facility] complaining of a constant sore throat and nasal, ear, and chest congestion that started five days ago.  He states that two days ago he noticed his symptoms starting to reach his lungs.  He states that the mucous he blows from his nose is green.  He lists cough productive of green sputum as an associated symptom.  The patient states that these symptoms always occur during the Spring and Winter, especially when around Christmas trees.  He states that he has gotten a shot of steroids to treat his symptoms in the past and he likes to treat his symptoms without antibiotics.  He states that Amoxicillin works better for his symptoms than Z-Pak.  He states that he has received a flu shot last month during his yearly physical.  He states his PCP is Dr. Clelia Croft.  The patient states that he has four children.    Past Medical History  Diagnosis Date  . CAD (coronary artery disease)   . Hyperlipidemia   . Hypertension   . GERD (gastroesophageal reflux disease)   . Hiatal hernia   . Gastric polyp   . Esophageal stricture   . Gout   . Benign prostatic hypertrophy    History reviewed. No pertinent past surgical history. Family History  Problem Relation Age of Onset  . Heart attack Father   . Lupus Sister   . Diabetes Sister     type 2  . Colon cancer Neg Hx   . Pancreatic cancer Neg Hx   . Stomach cancer Neg Hx   . COPD Mother    History   Social History  . Marital Status: Married    Spouse Name: N/A    Number of Children: N/A  . Years of Education: N/A   Occupational History  . Not on file.   Social History Main Topics  . Smoking status:  Never Smoker   . Smokeless tobacco: Never Used  . Alcohol Use: 3.0 oz/week    5 Glasses of wine per week  . Drug Use: No  . Sexual Activity: Not on file   Other Topics Concern  . Not on file   Social History Narrative  . No narrative on file   No Known Allergies   Review of Systems  HENT: Positive for congestion and sore throat.   Respiratory: Positive for cough.   All other systems reviewed and are negative.     Objective:  Physical Exam Physical Exam  Nursing note and vitals reviewed. Constitutional: He is oriented to person, place, and time. He appears well-developed and well-nourished. No distress.  HENT: There is purulent drainage from the nasopharynx worse on the left the  Head: Normocephalic and atraumatic.  Eyes: EOM are normal.  Neck: Neck supple. No tracheal deviation present.  Cardiovascular: Normal rate.   Pulmonary/Chest: Effort normal. No respiratory distress. There are rhonchi present in both bases.  Musculoskeletal: Normal range of motion.  Neurological: He is alert and oriented to person, place, and time.  Skin: Skin is warm and dry.  Psychiatric: He has a normal mood and affect. His  behavior is normal.  [facility] reading (PRIMARY) by  Dr. Cleta Alberts there is an elevated right hemidiaphragm. There is borderline cardiomegaly. There is evidence of previous granulomatous disease. There are no consolidated areas seen. Results for orders placed in visit on 01/14/13  POCT INFLUENZA A/B      Result Value Range   Influenza A, POC Negative     Influenza B, POC Negative    POCT CBC      Result Value Range   WBC 9.9  4.6 - 10.2 K/uL   Lymph, poc 2.5  0.6 - 3.4   POC LYMPH PERCENT 24.8  10 - 50 %L   MID (cbc) 0.9  0 - 0.9   POC MID % 9.0  0 - 12 %M   POC Granulocyte 6.6  2 - 6.9   Granulocyte percent 66.2  37 - 80 %G   RBC 5.16  4.69 - 6.13 M/uL   Hemoglobin 16.0  14.1 - 18.1 g/dL   HCT, POC 40.9  81.1 - 53.7 %   MCV 96.3  80 - 97 fL   MCH, POC 31.0  27 - 31.2 pg     MCHC 32.2  31.8 - 35.4 g/dL   RDW, POC 91.4     Platelet Count, POC 297  142 - 424 K/uL   MPV 8.9  0 - 99.8 fL        Assessment & Plan:  We'll treat with Augmentin for secondary infection he was given Hycodan for cough he will also take Mucinex twice a day . His pulse ox was 9394 so I think antibiotics will be appropriate in this situation . He does have underlying coronary artery disease. I personally performed the services described in this documentation, which was scribed in my presence. The recorded information has been reviewed and is accurate.

## 2013-01-14 NOTE — Patient Instructions (Signed)
Upper Respiratory Infection, Adult  An upper respiratory infection (URI) is also sometimes known as the common cold. The upper respiratory tract includes the nose, sinuses, throat, trachea, and bronchi. Bronchi are the airways leading to the lungs. Most people improve within 1 week, but symptoms can last up to 2 weeks. A residual cough may last even longer.   CAUSES  Many different viruses can infect the tissues lining the upper respiratory tract. The tissues become irritated and inflamed and often become very moist. Mucus production is also common. A cold is contagious. You can easily spread the virus to others by oral contact. This includes kissing, sharing a glass, coughing, or sneezing. Touching your mouth or nose and then touching a surface, which is then touched by another person, can also spread the virus.  SYMPTOMS   Symptoms typically develop 1 to 3 days after you come in contact with a cold virus. Symptoms vary from person to person. They may include:  · Runny nose.  · Sneezing.  · Nasal congestion.  · Sinus irritation.  · Sore throat.  · Loss of voice (laryngitis).  · Cough.  · Fatigue.  · Muscle aches.  · Loss of appetite.  · Headache.  · Low-grade fever.  DIAGNOSIS   You might diagnose your own cold based on familiar symptoms, since most people get a cold 2 to 3 times a year. Your caregiver can confirm this based on your exam. Most importantly, your caregiver can check that your symptoms are not due to another disease such as strep throat, sinusitis, pneumonia, asthma, or epiglottitis. Blood tests, throat tests, and X-rays are not necessary to diagnose a common cold, but they may sometimes be helpful in excluding other more serious diseases. Your caregiver will decide if any further tests are required.  RISKS AND COMPLICATIONS   You may be at risk for a more severe case of the common cold if you smoke cigarettes, have chronic heart disease (such as heart failure) or lung disease (such as asthma), or if  you have a weakened immune system. The very young and very old are also at risk for more serious infections. Bacterial sinusitis, middle ear infections, and bacterial pneumonia can complicate the common cold. The common cold can worsen asthma and chronic obstructive pulmonary disease (COPD). Sometimes, these complications can require emergency medical care and may be life-threatening.  PREVENTION   The best way to protect against getting a cold is to practice good hygiene. Avoid oral or hand contact with people with cold symptoms. Wash your hands often if contact occurs. There is no clear evidence that vitamin C, vitamin E, echinacea, or exercise reduces the chance of developing a cold. However, it is always recommended to get plenty of rest and practice good nutrition.  TREATMENT   Treatment is directed at relieving symptoms. There is no cure. Antibiotics are not effective, because the infection is caused by a virus, not by bacteria. Treatment may include:  · Increased fluid intake. Sports drinks offer valuable electrolytes, sugars, and fluids.  · Breathing heated mist or steam (vaporizer or shower).  · Eating chicken soup or other clear broths, and maintaining good nutrition.  · Getting plenty of rest.  · Using gargles or lozenges for comfort.  · Controlling fevers with ibuprofen or acetaminophen as directed by your caregiver.  · Increasing usage of your inhaler if you have asthma.  Zinc gel and zinc lozenges, taken in the first 24 hours of the common cold, can shorten the   duration and lessen the severity of symptoms. Pain medicines may help with fever, muscle aches, and throat pain. A variety of non-prescription medicines are available to treat congestion and runny nose. Your caregiver can make recommendations and may suggest nasal or lung inhalers for other symptoms.   HOME CARE INSTRUCTIONS   · Only take over-the-counter or prescription medicines for pain, discomfort, or fever as directed by your  caregiver.  · Use a warm mist humidifier or inhale steam from a shower to increase air moisture. This may keep secretions moist and make it easier to breathe.  · Drink enough water and fluids to keep your urine clear or pale yellow.  · Rest as needed.  · Return to work when your temperature has returned to normal or as your caregiver advises. You may need to stay home longer to avoid infecting others. You can also use a face mask and careful hand washing to prevent spread of the virus.  SEEK MEDICAL CARE IF:   · After the first few days, you feel you are getting worse rather than better.  · You need your caregiver's advice about medicines to control symptoms.  · You develop chills, worsening shortness of breath, or brown or red sputum. These may be signs of pneumonia.  · You develop yellow or brown nasal discharge or pain in the face, especially when you bend forward. These may be signs of sinusitis.  · You develop a fever, swollen neck glands, pain with swallowing, or white areas in the back of your throat. These may be signs of strep throat.  SEEK IMMEDIATE MEDICAL CARE IF:   · You have a fever.  · You develop severe or persistent headache, ear pain, sinus pain, or chest pain.  · You develop wheezing, a prolonged cough, cough up blood, or have a change in your usual mucus (if you have chronic lung disease).  · You develop sore muscles or a stiff neck.  Document Released: 07/16/2000 Document Revised: 04/14/2011 Document Reviewed: 05/24/2010  ExitCare® Patient Information ©2014 [employer].  Cough, Adult   A cough is a reflex that helps clear your throat and airways. It can help heal the body or may be a reaction to an irritated airway. A cough may only last 2 or 3 weeks (acute) or may last more than 8 weeks (chronic).   CAUSES  Acute cough:  · Viral or bacterial infections.  Chronic cough:  · Infections.  · Allergies.  · Asthma.  · Post-nasal drip.  · Smoking.  · Heartburn or acid reflux.  · Some  medicines.  · Chronic lung problems (COPD).  · Cancer.  SYMPTOMS   · Cough.  · Fever.  · Chest pain.  · Increased breathing rate.  · High-pitched whistling sound when breathing (wheezing).  · Colored mucus that you cough up (sputum).  TREATMENT   · A bacterial cough may be treated with antibiotic medicine.  · A viral cough must run its course and will not respond to antibiotics.  · Your caregiver may recommend other treatments if you have a chronic cough.  HOME CARE INSTRUCTIONS   · Only take over-the-counter or prescription medicines for pain, discomfort, or fever as directed by your caregiver. Use cough suppressants only as directed by your caregiver.  · Use a cold steam vaporizer or humidifier in your bedroom or home to help loosen secretions.  · Sleep in a semi-upright position if your cough is worse at night.  · Rest as needed.  ·   Stop smoking if you smoke.  SEEK IMMEDIATE MEDICAL CARE IF:   · You have pus in your sputum.  · Your cough starts to worsen.  · You cannot control your cough with suppressants and are losing sleep.  · You begin coughing up blood.  · You have difficulty breathing.  · You develop pain which is getting worse or is uncontrolled with medicine.  · You have a fever.  MAKE SURE YOU:   · Understand these instructions.  · Will watch your condition.  · Will get help right away if you are not doing well or get worse.  Document Released: 07/19/2010 Document Revised: 04/14/2011 Document Reviewed: 07/19/2010  ExitCare® Patient Information ©2014 [employer].

## 2013-01-19 ENCOUNTER — Ambulatory Visit: Payer: BC Managed Care – PPO | Admitting: Internal Medicine

## 2013-02-03 HISTORY — PX: SHOULDER ARTHROSCOPY: SHX128

## 2013-02-10 ENCOUNTER — Encounter: Payer: BC Managed Care – PPO | Admitting: Internal Medicine

## 2013-02-17 ENCOUNTER — Ambulatory Visit (AMBULATORY_SURGERY_CENTER): Payer: BC Managed Care – PPO | Admitting: Internal Medicine

## 2013-02-17 ENCOUNTER — Encounter: Payer: Self-pay | Admitting: Internal Medicine

## 2013-02-17 VITALS — BP 143/78 | HR 58 | Temp 96.8°F | Resp 13 | Ht 70.0 in | Wt 258.0 lb

## 2013-02-17 DIAGNOSIS — R195 Other fecal abnormalities: Secondary | ICD-10-CM

## 2013-02-17 DIAGNOSIS — D126 Benign neoplasm of colon, unspecified: Secondary | ICD-10-CM

## 2013-02-17 MED ORDER — SODIUM CHLORIDE 0.9 % IV SOLN: 500.0000 mL | INTRAVENOUS | Status: DC

## 2013-02-17 NOTE — Patient Instructions (Signed)
YOU HAD AN ENDOSCOPIC PROCEDURE TODAY AT THE [facility]: Refer to the procedure report that was given to you for any specific questions about what was found during the examination.  If the procedure report does not answer your questions, please call your gastroenterologist to clarify.  If you requested that your care partner not be given the details of your procedure findings, then the procedure report has been included in a sealed envelope for you to review at your convenience later.  YOU SHOULD EXPECT: Some feelings of bloating in the abdomen. Passage of more gas than usual.  Walking can help get rid of the air that was put into your GI tract during the procedure and reduce the bloating. If you had a lower endoscopy (such as a colonoscopy or flexible sigmoidoscopy) you may notice spotting of blood in your stool or on the toilet paper. If you underwent a bowel prep for your procedure, then you may not have a normal bowel movement for a few days.  DIET: Your first meal following the procedure should be a light meal and then it is ok to progress to your normal diet.  A half-sandwich or bowl of soup is an example of a good first meal.  Heavy or fried foods are harder to digest and may make you feel nauseous or bloated.  Likewise meals heavy in dairy and vegetables can cause extra gas to form and this can also increase the bloating.  Drink plenty of fluids but you should avoid alcoholic beverages for 24 hours.  ACTIVITY: Your care partner should take you home directly after the procedure.  You should plan to take it easy, moving slowly for the rest of the day.  You can resume normal activity the day after the procedure however you should NOT DRIVE or use heavy machinery for 24 hours (because of the sedation medicines used during the test).    SYMPTOMS TO REPORT IMMEDIATELY: A gastroenterologist can be reached at any hour.  During normal business hours, 8:30 AM to 5:00 PM Monday through Friday,  call [phone].  After hours and on weekends, please call the GI answering service at [phone] who will take a message and have the physician on call contact you.   Following lower endoscopy (colonoscopy or flexible sigmoidoscopy):  Excessive amounts of blood in the stool  Significant tenderness or worsening of abdominal pains  Swelling of the abdomen that is new, acute  Fever of 100F or higher   FOLLOW UP: If any biopsies were taken you will be contacted by phone or by letter within the next 1-3 weeks.  Call your gastroenterologist if you have not heard about the biopsies in 3 weeks.  Our staff will call the home number listed on your records the next business day following your procedure to check on you and address any questions or concerns that you may have at that time regarding the information given to you following your procedure. This is a courtesy call and so if there is no answer at the home number and we have not heard from you through the emergency physician on call, we will assume that you have returned to your regular daily activities without incident.  SIGNATURES/CONFIDENTIALITY: You and/or your care partner have signed paperwork which will be entered into your electronic medical record.  These signatures attest to the fact that that the information above on your After Visit Summary has been reviewed and is understood.  Full responsibility of the confidentiality of   this discharge information lies with you and/or your care-partner.   Handouts were given to your care partner on polyps, diverticulosis and a high fiber diet with liberal fluid intake. You may resume your current medications today. Please call if any questions or concerns.

## 2013-02-17 NOTE — Op Note (Signed)
[facility]  [address] [place], [address]   COLONOSCOPY PROCEDURE REPORT  PATIENT: Leonard, Edwards  MR#: [id number] BIRTHDATE: 11/03/1955 , 49  yrs. old GENDER: Male ENDOSCOPIST: Eustace Quail, MD REFERRED BY:W.  Lutricia Feil, M.D. PROCEDURE DATE:  02/17/2013 PROCEDURE:   Colonoscopy with snare polypectomy x 1 First Screening Colonoscopy - Avg.  risk and is 50 yrs.  old or older - No.  Prior Negative Screening - Now for repeat screening. N/A  History of Adenoma - Now for follow-up colonoscopy & has been > or = to 3 yrs.  N/A  Polyps Removed Today? Yes. ASA CLASS:   Class III INDICATIONS:heme-positive stool.   index exam October 2009-negative MEDICATIONS: MAC sedation, administered by CRNA and propofol (Diprivan) 450mg  IV DESCRIPTION OF PROCEDURE:   After the risks benefits and alternatives of the procedure were thoroughly explained, informed consent was obtained.  A digital rectal exam revealed no abnormalities of the rectum.   The [id number]  endoscope was introduced through the anus and advanced to the cecum, which was identified by both the appendix and ileocecal valve. No adverse events experienced.   The quality of the prep was excellent, using MoviPrep  The instrument was then slowly withdrawn as the colon was fully examined.   COLON FINDINGS: A small sessile polyp (appear to be a sessile serrated polyp) measuring 5 mm in size was found at the cecum.  A polypectomy was performed with a cold snare.  The resection was complete. No meaningful tissue available for submission.   Moderate diverticulosis was noted throughout the entire examined colon. The colon mucosa was otherwise normal.  Retroflexed views revealed internal hemorrhoids. The time to cecum=2 minutes 10 seconds. Withdrawal time=17 minutes 30 seconds.  The scope was withdrawn and the procedure completed. COMPLICATIONS: There were no complications.  ENDOSCOPIC IMPRESSION: 1.    Small polyp found at the cecum; polypectomy was performed with a cold snare 2.   Moderate diverticulosis was noted throughout the entire examined colon 3.   The colon mucosa was otherwise normal  RECOMMENDATIONS: 1. Follow up colonoscopy in 5 years   eSigned:  Eustace Quail, MD 02/17/2013 4:12 PM   cc: Janalyn Rouse, MD and The Patient

## 2013-02-17 NOTE — Progress Notes (Signed)
A/ox3 pleased with MAC, report to Annette RN 

## 2013-02-17 NOTE — Progress Notes (Signed)
Called to room to assist during endoscopic procedure.  Patient ID and intended procedure confirmed with present staff. Received instructions for my participation in the procedure from the performing physician.  

## 2013-02-18 ENCOUNTER — Telehealth: Payer: Self-pay | Admitting: *Deleted

## 2013-02-18 NOTE — Telephone Encounter (Signed)
No answer, left message to call if questions or concerns. 

## 2013-02-18 NOTE — Addendum Note (Signed)
Addended by: Ernestine Conrad D on: 02/18/2013 10:09 AM   Modules accepted: Orders

## 2013-04-01 ENCOUNTER — Other Ambulatory Visit: Payer: Self-pay | Admitting: Neurosurgery

## 2013-04-01 DIAGNOSIS — M719 Bursopathy, unspecified: Principal | ICD-10-CM

## 2013-04-01 DIAGNOSIS — M67919 Unspecified disorder of synovium and tendon, unspecified shoulder: Secondary | ICD-10-CM

## 2013-04-18 ENCOUNTER — Ambulatory Visit
Admission: RE | Admit: 2013-04-18 | Discharge: 2013-04-18 | Disposition: A | Discharge status: Home / Self Care | Payer: BC Managed Care – PPO | Source: Ambulatory Visit | Attending: Neurosurgery | Admitting: Neurosurgery

## 2013-04-18 DIAGNOSIS — M67919 Unspecified disorder of synovium and tendon, unspecified shoulder: Secondary | ICD-10-CM

## 2013-04-18 DIAGNOSIS — M719 Bursopathy, unspecified: Principal | ICD-10-CM

## 2013-06-20 ENCOUNTER — Telehealth: Payer: Self-pay | Admitting: Cardiovascular Disease

## 2013-06-20 NOTE — Telephone Encounter (Signed)
I spoke with the pt and he continues to have pain in his shoulder and neck. The pt has scheduled an appointment to follow-up with Dr Sherwood Gambler this Wednesday at 12:15. The pt also said last week he had nausea and belching.  The pt thought this was indigestion or a possible stomach virus that was causing symptoms.  Due to the pt's symptoms he would like to be evaluated because he is unsure if his symptoms could be from a cardiac issue.  I made the pt aware that Dr Burt Knack is not in the office this week and I will try to arrange an appointment with a PA/NP.  I will contact the pt with an appointment.

## 2013-06-20 NOTE — Telephone Encounter (Signed)
New Message:  PT states he is having some pain in his shoulder and neck and wants to see Dr. Burt Knack asap. Pt did not want to wait for the cancellation in July. Nor does he wish to see a PA/NP. PT is asking to speak w/ Lauren to be worked in for a sooner appt... States he did not know if those symptoms could be related to his stint.

## 2013-06-21 NOTE — Telephone Encounter (Signed)
Follow Up  Pt called states that he was returning the call to schedule a stress test//Please call

## 2013-06-21 NOTE — Telephone Encounter (Signed)
I spoke with the Leonard Edwards and have arranged a follow-up with [name] NP on 06/22/13 at 10:00. Leonard Edwards agreed with plan.

## 2013-06-22 ENCOUNTER — Encounter: Payer: Self-pay | Admitting: Nurse Practitioner

## 2013-06-22 ENCOUNTER — Ambulatory Visit (INDEPENDENT_AMBULATORY_CARE_PROVIDER_SITE_OTHER): Payer: BC Managed Care – PPO | Admitting: Nurse Practitioner

## 2013-06-22 VITALS — BP 170/100 | HR 60 | Ht 70.0 in | Wt 258.0 lb

## 2013-06-22 DIAGNOSIS — R079 Chest pain, unspecified: Secondary | ICD-10-CM

## 2013-06-22 DIAGNOSIS — G459 Transient cerebral ischemic attack, unspecified: Secondary | ICD-10-CM

## 2013-06-22 DIAGNOSIS — E785 Hyperlipidemia, unspecified: Secondary | ICD-10-CM

## 2013-06-22 DIAGNOSIS — I259 Chronic ischemic heart disease, unspecified: Secondary | ICD-10-CM

## 2013-06-22 DIAGNOSIS — I10 Essential (primary) hypertension: Secondary | ICD-10-CM

## 2013-06-22 LAB — TROPONIN I: Troponin I: <0.3 ng/mL (ref <0.30)

## 2013-06-22 LAB — CK TOTAL AND CKMB (NOT AT ARMC)
CK, MB: 2.4 ng/mL (ref 0.3–4.0)
Relative Index: 1.5 (ref 0.0–4.0)
Total CK: 160 U/L (ref 7–232)

## 2013-06-22 MED ORDER — AMLODIPINE BESYLATE 5 MG PO TABS: 5.0000 mg | ORAL_TABLET | Freq: Every day | ORAL | Status: DC

## 2013-06-22 NOTE — Progress Notes (Signed)
Tye Date of Birth: [birth year] Medical Record #[id number]  History of Present Illness: Mr. Domangue is seen back today for a work in visit. Seen for Dr. Burt Knack. He is a 58 year old male with known CAD - had ACS several years ago with PCI of the RCA, followed PCI/stenting of the ramus intermedius in 2011. Other issues include HTN, HLD, neck pain, gout, and GERD. Negative GXT in November of 2013.   Last seen here in April of 2014 - has not been here since - was doing ok at that time.  Called yesterday with neck and shoulder pain - worried it might be his heart. Added to my schedule.   Comes in today. Here alone. Has several issues. He notes that he has done ok for the most part for the past year. Now on Singulair - that has helped him not have bronchitis/sinusitis. Continues to exercise - hard to say how regular he exercises - but does go to the gym and use the elliptical. He will walk outside if the weather is good - inclines make him short of breath. He has continued to have shoulder/neck issues - seeing Dr. Sherwood Gambler later today. Last week had some "sharp" stomach pains - felt nauseated. Noted some pain in his chest along with his neck and shoulders. Didn't feel "good" with his walk on Monday night - overall he is just not sure how he is doing and is worried about his heart. Not sure if this is like what he has felt in the past prior to his PCI. Does have residual disease of 30 to 50% LAD noted on last cath from 2011. BP typically over 778 systolic at home.    Current Outpatient Prescriptions  Medication Sig Dispense Refill  . allopurinol (ZYLOPRIM) 300 MG tablet       . aspirin 81 MG tablet Take 81 mg by mouth daily.        . Cholecalciferol (VITAMIN D3) 1000 UNITS CAPS Take 1,000 Units by mouth daily.        Marland Kitchen CIALIS 20 MG tablet TAKE ONE TABLET DAILY AS NEEDED FOR     ERECTILE DYSFUNCTION  10 tablet  5  . Coenzyme Q10 (CO Q 10) 100 MG CAPS Take 100 mg by mouth daily.        .  fluticasone (FLONASE) 50 MCG/ACT nasal spray TWO SPRAYS DAILY IN NOSE  16 g  0  . hydrochlorothiazide (HYDRODIURIL) 25 MG tablet TAKE ONE TABLET EACH DAY  30 tablet  0  . HYDROcodone-homatropine (HYCODAN) 5-1.5 MG/5ML syrup Take 5 mLs by mouth every 8 (eight) hours as needed for cough.  120 mL  0  . lisinopril (PRINIVIL,ZESTRIL) 40 MG tablet TAKE ONE TABLET EACH DAY  30 tablet  2  . LOVAZA 1 G capsule TAKE ONE CAPSULE TWICE A DAY  60 each  0  . metoprolol succinate (TOPROL-XL) 50 MG 24 hr tablet TAKE ONE TABLET TWICE DAILY  60 tablet  3  . montelukast (SINGULAIR) 10 MG tablet Take 10 mg by mouth at bedtime.      Marland Kitchen omeprazole (PRILOSEC) 40 MG capsule TAKE ONE CAPSULE EVERY MORNING  30 capsule  3  . rosuvastatin (CRESTOR) 10 MG tablet Take 1 tablet (10 mg total) by mouth daily.  90 tablet  3   No current facility-administered medications for this visit.    No Known Allergies  Past Medical History  Diagnosis Date  . CAD (coronary artery disease)   .  Hyperlipidemia   . Hypertension   . GERD (gastroesophageal reflux disease)   . Hiatal hernia   . Gastric polyp   . Esophageal stricture   . Gout   . Benign prostatic hypertrophy     History reviewed. No pertinent past surgical history.  History  Smoking status  . Never Smoker   Smokeless tobacco  . Never Used    History  Alcohol Use  . 3.0 oz/week  . 5 Glasses of wine per week    Family History  Problem Relation Age of Onset  . Heart attack Father   . Lupus Sister   . Diabetes Sister     type 2  . Colon cancer Neg Hx   . Pancreatic cancer Neg Hx   . Stomach cancer Neg Hx   . COPD Mother     Review of Systems: The review of systems is per the HPI.  All other systems were reviewed and are negative.  Physical Exam: BP 170/100  Pulse 60  Ht 5\' 10"  (1.778 m)  Wt 258 lb (117.028 kg)  BMI 37.02 kg/m2 Patient is very pleasant and in no acute distress. Skin is warm and dry. Color is normal.  HEENT is unremarkable.  Normocephalic/atraumatic. PERRL. Sclera are nonicteric. Neck is supple. No masses. No JVD. Lungs are clear. Cardiac exam shows a regular rate and rhythm. Abdomen is soft. Extremities are without edema. Gait and ROM are intact. No gross neurologic deficits noted.  Wt Readings from Last 3 Encounters:  06/22/13 258 lb (117.028 kg)  02/17/13 258 lb (117.028 kg)  01/14/13 258 lb 6.4 oz (117.209 kg)     LABORATORY DATA: EKG today shows sinus rhythm. No acute changes.    Lab Results  Component Value Date   WBC 9.9 01/14/2013   HGB 16.0 01/14/2013   HCT 49.7 01/14/2013   PLT 267.0 10/25/2010   GLUCOSE 84 10/25/2010   CHOL 132 10/25/2010   TRIG 223.0* 10/25/2010   HDL 38.00* 10/25/2010   LDLDIRECT 68.2 10/25/2010   LDLCALC 102* 07/13/2008   ALT 42 10/25/2010   AST 34 10/25/2010   NA 143 10/25/2010   K 4.1 10/25/2010   CL 103 10/25/2010   CREATININE 0.9 10/25/2010   BUN 13 10/25/2010   CO2 31 10/25/2010   TSH 1.70 02/06/2010   PSA 1.03 08/29/2009   INR 1.1 ratio* 10/29/2009     Assessment / Plan:  1. Neck/shoulder pain - seeing neurosurgery later today.   2. CAD with remote PCIs - off Plavix - multiple complaints of stomach, chest, neck and shoulder pain. Will arrange for stress Myoview. Check set of cardiac enzymes. Further disposition to follow.   3. HTN - BP quite high - 170/100 by me - adding Norvasc 5 mg a day. I have asked him to monitor at home.   4. HLD - on statin - monitored by Dr. Brigitte Pulse.   See Dr. Burt Knack back in follow up.   Patient is agreeable to this plan and will call if any problems develop in the interim.   Burtis Junes, RN, [facility] [address] [place], [place]  [id number] [phone]

## 2013-06-22 NOTE — Patient Instructions (Addendum)
Stay on your current medicines but I am adding Norvasc 5 mg a day for your blood pressure  We are going to check a set of cardiac enzymes  We are going to arrange for a stress Myoview.   See Dr. Burt Knack back for follow up in the next couple of weeks  Monitor your blood pressure at home - keep a diary. Bring in for review at your next visit.   Call the [place] office at [phone] if you have any questions, problems or concerns.

## 2013-07-05 ENCOUNTER — Encounter: Payer: Self-pay | Admitting: Internal Medicine

## 2013-07-05 ENCOUNTER — Encounter: Payer: Self-pay | Admitting: Cardiovascular Disease

## 2013-07-06 ENCOUNTER — Ambulatory Visit (HOSPITAL_COMMUNITY): Payer: BC Managed Care – PPO | Attending: Cardiology | Admitting: Radiology

## 2013-07-06 VITALS — BP 149/91 | Ht 70.0 in | Wt 258.0 lb

## 2013-07-06 DIAGNOSIS — Z9861 Coronary angioplasty status: Secondary | ICD-10-CM | POA: Insufficient documentation

## 2013-07-06 DIAGNOSIS — R079 Chest pain, unspecified: Secondary | ICD-10-CM | POA: Insufficient documentation

## 2013-07-06 DIAGNOSIS — E785 Hyperlipidemia, unspecified: Secondary | ICD-10-CM

## 2013-07-06 DIAGNOSIS — I10 Essential (primary) hypertension: Secondary | ICD-10-CM

## 2013-07-06 DIAGNOSIS — I2510 Atherosclerotic heart disease of native coronary artery without angina pectoris: Secondary | ICD-10-CM

## 2013-07-06 DIAGNOSIS — I259 Chronic ischemic heart disease, unspecified: Secondary | ICD-10-CM

## 2013-07-06 MED ORDER — TECHNETIUM TC 99M SESTAMIBI GENERIC - CARDIOLITE
11.0000 | Freq: Once | INTRAVENOUS | Status: AC | PRN
Start: 2013-07-06 — End: 2013-07-06
  Administered 2013-07-06: 11 via INTRAVENOUS

## 2013-07-06 MED ORDER — TECHNETIUM TC 99M SESTAMIBI GENERIC - CARDIOLITE
33.0000 | Freq: Once | INTRAVENOUS | Status: AC | PRN
Start: 2013-07-06 — End: 2013-07-06
  Administered 2013-07-06: 33 via INTRAVENOUS

## 2013-07-06 NOTE — Progress Notes (Signed)
[facility]LE[facility] [address] [place], [place] [address] [phone]    Cardiology Nuclear Med [facility] Leonard Edwards is a 58 y.o. male     MRN : [id number]     DOB: February 11, 1955  Procedure Date: 07/06/2013  Nuclear Med Background Indication for Stress Test:  Evaluation for Ischemia, Surgical Clearance and Stent Patency- Shoulder surgery- Dr. Elsie Saas History:  CAD-CATH-STENT-Multiple 11/13 NL GXT Cardiac Risk Factors: Family History - CAD, Hypertension and Lipids  Symptoms:  Chest Pain   Nuclear Pre-Procedure Caffeine/Decaff Intake:  None NPO After: 9:30pm   Lungs:  clear O2 Sat: 95% on room air. IV 0.9% NS with Angio Cath:  22g  IV Site: R Hand  IV Started by:  Crissie Figures, RN  Chest Size (in):  50 Cup Size: n/a  Height: 5\' 10"  (1.778 m)  Weight:  258 lb (117.028 kg)  BMI:  Body mass index is 37.02 kg/(m^2). Tech Comments:  n/a    Nuclear Med Study 1 or 2 day study: 1 day  Stress Test Type:  Stress  Reading MD: N/A  Order Authorizing Provider:  Sherren Mocha, MD  Resting Radionuclide: Technetium 71m Sestamibi  Resting Radionuclide Dose: 11.0 mCi   Stress Radionuclide:  Technetium 52m Sestamibi  Stress Radionuclide Dose: 32.4 mCi           Stress Protocol REST HR: 55 Stress HR: 142  Rest BP: 149/91 Stress BP: 206/100  Exercise Time (min): 11:30 METS: 13.20   Predicted Max HR: 163 bpm % Max HR: 87.12 bpm Rate Pressure Product: 29252   Dose of Adenosine (mg):  n/a Dose of Lexiscan: n/a mg  Dose of Atropine (mg): n/a Dose of Dobutamine: n/a mcg/kg/min (at max HR)  Stress Test Technologist: Perrin Maltese, EMT-P  Nuclear Technologist:  Annye Rusk, CNMT     Rest Procedure:  Myocardial perfusion imaging was performed at rest 45 minutes following the intravenous administration of Technetium 46m Sestamibi. Rest ECG: NSR - Normal EKG  Stress Procedure:  The patient exercised on the treadmill utilizing the Bruce Protocol for 11:30  minutes. The patient stopped due to fatigue and denied any chest pain.  Technetium 32m Sestamibi was injected at peak exercise and myocardial perfusion imaging was performed after a brief delay. Stress ECG: Insignificant upsloping ST segment depression.  QPS Raw Data Images:  Normal; no motion artifact; normal heart/lung ratio. Stress Images:  Small defect of mild severity in midanteroseptal segment. Rest Images:  Comparison with the stress images reveals no significant change. Subtraction (SDS):  This is a fixed defect that is most consistent with a previous scar. No reversible ischemia. Transient Ischemic Dilatation (Normal <1.22):  0.95 Lung/Heart Ratio (Normal <0.45):  0.29  Quantitative Gated Spect Images QGS EDV:  139 ml QGS ESV:  69 ml  Impression Exercise Capacity:  Good exercise capacity. BP Response:  Normal blood pressure response. Clinical Symptoms:  No chest pain. ECG Impression:  No significant ST segment change suggestive of ischemia. Comparison with Prior Nuclear Study: No images to compare  Overall Impression:  Low risk stress nuclear study. Small anteroseptal scar. No ischemia.  Normal LV systolic function.  LV Ejection Fraction: 50%.  LV Wall Motion:  Normal Wall Motion  Darlin Coco MD

## 2013-07-07 NOTE — Progress Notes (Signed)
Quick Note:  LMTCB 6/4 (mobile phone); no answer at home phone ______

## 2013-07-07 NOTE — Progress Notes (Signed)
Quick Note:  LMTCB 6/4 ______ 

## 2013-07-14 ENCOUNTER — Ambulatory Visit (INDEPENDENT_AMBULATORY_CARE_PROVIDER_SITE_OTHER): Payer: BC Managed Care – PPO | Admitting: Cardiovascular Disease

## 2013-07-14 ENCOUNTER — Encounter: Payer: Self-pay | Admitting: Cardiovascular Disease

## 2013-07-14 VITALS — BP 124/78 | HR 67 | Ht 70.0 in | Wt 254.4 lb

## 2013-07-14 DIAGNOSIS — I2510 Atherosclerotic heart disease of native coronary artery without angina pectoris: Secondary | ICD-10-CM

## 2013-07-14 NOTE — Patient Instructions (Signed)
Your physician wants you to follow-up in: 6 MONTHS with Dr Cooper.  You will receive a reminder letter in the mail two months in advance. If you don't receive a letter, please call our office to schedule the follow-up appointment.  Your physician recommends that you continue on your current medications as directed. Please refer to the Current Medication list given to you today.  

## 2013-07-16 ENCOUNTER — Encounter: Payer: Self-pay | Admitting: Cardiovascular Disease

## 2013-07-16 NOTE — Progress Notes (Signed)
HPI:  58 year-old gentleman presented for followup evaluation. He's followed for coronary artery disease and hypertension. He initially presented several years ago with acute coronary syndrome and underwent PCI to right coronary artery. He developed recurrent anginal symptoms and underwent stenting of the ramus intermedius branch and 2011.  Last seen by [name] 5/20 with multiple complaints including neck, chest, and shoulder pain. Cardiac enzymes were checked and they were negative. A stress Myoview was performed with result outlined below.   The patient is feeling better now. He has no exertional symptoms. Still having problems with right shoulder pain and planning orthopedic eval/surgery. No dyspnea or edema.   Outpatient Encounter Prescriptions as of 07/14/2013  Medication Sig  . allopurinol (ZYLOPRIM) 300 MG tablet   . amLODipine (NORVASC) 5 MG tablet Take 1 tablet (5 mg total) by mouth daily.  Marland Kitchen aspirin 81 MG tablet Take 81 mg by mouth daily.    . Cholecalciferol (VITAMIN D3) 1000 UNITS CAPS Take 1,000 Units by mouth daily.    Marland Kitchen CIALIS 20 MG tablet TAKE ONE TABLET DAILY AS NEEDED FOR     ERECTILE DYSFUNCTION  . Coenzyme Q10 (CO Q 10) 100 MG CAPS Take 100 mg by mouth daily.    . fluticasone (FLONASE) 50 MCG/ACT nasal spray TWO SPRAYS DAILY IN NOSE  . hydrochlorothiazide (HYDRODIURIL) 25 MG tablet TAKE ONE TABLET EACH DAY  . lisinopril (PRINIVIL,ZESTRIL) 40 MG tablet TAKE ONE TABLET EACH DAY  . LOVAZA 1 G capsule TAKE ONE CAPSULE TWICE A DAY  . metoprolol succinate (TOPROL-XL) 50 MG 24 hr tablet TAKE ONE TABLET TWICE DAILY  . montelukast (SINGULAIR) 10 MG tablet Take 10 mg by mouth at bedtime.  Marland Kitchen omeprazole (PRILOSEC) 40 MG capsule TAKE ONE CAPSULE EVERY MORNING  . rosuvastatin (CRESTOR) 10 MG tablet Take 1 tablet (10 mg total) by mouth daily.  . [DISCONTINUED] HYDROcodone-homatropine (HYCODAN) 5-1.5 MG/5ML syrup Take 5 mLs by mouth every 8 (eight) hours as needed for cough.     No Known Allergies  Past Medical History  Diagnosis Date  . CAD (coronary artery disease)   . Hyperlipidemia   . Hypertension   . GERD (gastroesophageal reflux disease)   . Hiatal hernia   . Gastric polyp   . Esophageal stricture   . Gout   . Benign prostatic hypertrophy     ROS: Negative except as per HPI  BP 124/78  Pulse 67  Ht 5\' 10"  (1.778 m)  Wt 115.395 kg (254 lb 6.4 oz)  BMI 36.50 kg/m2  PHYSICAL EXAM: Pt is alert and oriented, pleasant overweight male in NAD HEENT: normal Neck: JVP - normal, carotids 2+= without bruits Lungs: CTA bilaterally CV: RRR without murmur or gallop Abd: soft, NT, Positive BS, no hepatomegaly Ext: no C/C/E, distal pulses intact and equal Skin: warm/dry no rash  Myoview Scan 07/07/2013: QPS  Raw Data Images: Normal; no motion artifact; normal heart/lung ratio.  Stress Images: Small defect of mild severity in midanteroseptal segment.  Rest Images: Comparison with the stress images reveals no significant change.  Subtraction (SDS): This is a fixed defect that is most consistent with a previous scar. No reversible ischemia.  Transient Ischemic Dilatation (Normal <1.22): 0.95  Lung/Heart Ratio (Normal <0.45): 0.29  Quantitative Gated Spect Images  QGS EDV: 139 ml  QGS ESV: 69 ml  Impression  Exercise Capacity: Good exercise capacity.  BP Response: Normal blood pressure response.  Clinical Symptoms: No chest pain.  ECG Impression: No significant ST segment  change suggestive of ischemia.  Comparison with Prior Nuclear Study: No images to compare  Overall Impression: Low risk stress nuclear study. Small anteroseptal scar. No ischemia. Normal LV systolic function.  LV Ejection Fraction: 50%. LV Wall Motion: Normal Wall Motion   ASSESSMENT AND PLAN: 1. CAD, native vessel. Reviewed recent 12 lead EKG, labs, and Myoview result. Appears stable and favorable prognosis. He is ok to proceed with shoulder surgery at low risk of cardiac  events. Needs to focus on weight loss/diet, but this is a long-term issue. He understands issues - barriers include very busy and hectic work schedule, stress, etc.  2. HTN - BP much better controlled on amlodipine started by Cecille Rubin at recent visit. Continue same Rx, again weight loss stressed.  3. Hyperlipidemia - on lovaza and crestor.  Sherren Mocha 07/16/2013 2:48 PM

## 2013-07-22 ENCOUNTER — Telehealth: Payer: Self-pay | Admitting: Cardiovascular Disease

## 2013-07-22 NOTE — Telephone Encounter (Signed)
Received request from Nurse fax box, documents faxed for surgical clearance. To: Raliegh Ip Fax number: [phone] Attention: [phone]/km

## 2013-10-31 ENCOUNTER — Other Ambulatory Visit: Payer: Self-pay | Admitting: Internal Medicine

## 2013-12-19 ENCOUNTER — Other Ambulatory Visit: Payer: Self-pay | Admitting: Nurse Practitioner

## 2013-12-26 ENCOUNTER — Other Ambulatory Visit: Payer: Self-pay | Admitting: Cardiovascular Disease

## 2014-03-28 ENCOUNTER — Other Ambulatory Visit: Payer: Self-pay | Admitting: Cardiovascular Disease

## 2014-04-25 ENCOUNTER — Other Ambulatory Visit: Payer: Self-pay | Admitting: Nurse Practitioner

## 2014-04-25 ENCOUNTER — Other Ambulatory Visit: Payer: Self-pay | Admitting: Internal Medicine

## 2014-05-11 ENCOUNTER — Encounter: Payer: Self-pay | Admitting: Cardiovascular Disease

## 2014-05-11 NOTE — Progress Notes (Signed)
This encounter was created in error - please disregard.

## 2014-05-15 ENCOUNTER — Encounter: Payer: Self-pay | Admitting: Cardiovascular Disease

## 2014-06-21 ENCOUNTER — Telehealth: Payer: Self-pay

## 2014-06-21 MED ORDER — AMOXICILLIN 250 MG PO CAPS: 250.0000 mg | ORAL_CAPSULE | Freq: Two times a day (BID) | ORAL | Status: DC

## 2014-06-21 NOTE — Telephone Encounter (Signed)
-----   Message from Irene Shipper, MD sent at 06/21/2014  8:56 AM EDT ----- Regarding: script Vaughan Basta, Mr. Sans called me. He has sinusitis. Please call in amoxicillin 125 mg; #20; one po bid. No refills. Thanks

## 2014-06-21 NOTE — Telephone Encounter (Signed)
Script sent to pharmacy. Dose clarified with Dr. Inocencio Homes 250mg  BID for 10days.

## 2014-07-06 ENCOUNTER — Telehealth: Payer: Self-pay | Admitting: Cardiovascular Disease

## 2014-07-06 NOTE — Telephone Encounter (Signed)
Spoke with pt and over the past few weeks he has noticed that he is more SOB during his exercise.  He woke up at 2am this morning with left side numbness in his hand and it took him awhile to get oriented.  Once he did his wife took his BP and it was 165/107 Hr 78 and so he took his morning BP medications and ASA at that time.  Retook his BP shortly after and it was down 131/82.  Pt has schedule to see primary soon but wanted to know if can come into our office and get an EKG as this in the past is what was done when he needed a procedure. Pt has no c/o of CP.  Pt stated he just does not feel well. Pt stated he has not seen Dr. Burt Knack in awhile and would like an appointment to see him when he can be worked in.

## 2014-07-06 NOTE — Telephone Encounter (Signed)
New message  Pt c/o Shortness Of Breath: STAT if SOB developed within the last 24 hours or pt is noticeably SOB on the phone  1. Are you currently SOB (can you hear that pt is SOB on the phone)? Not right now  2. How long have you been experiencing SOB? About a week or so   3. Are you SOB when sitting or when up moving around? Both   4. Are you currently experiencing any other symptoms? At night SOB and A little tingling in left arm   Comments: Pt states that he hasn't been feeling well after his stent was placed in. Request to come in to have an EKG.Leonard Edwards

## 2014-07-10 ENCOUNTER — Telehealth: Payer: Self-pay | Admitting: Cardiovascular Disease

## 2014-07-10 ENCOUNTER — Encounter: Payer: Self-pay | Admitting: Nurse Practitioner

## 2014-07-10 ENCOUNTER — Ambulatory Visit (INDEPENDENT_AMBULATORY_CARE_PROVIDER_SITE_OTHER): Payer: BLUE CROSS/BLUE SHIELD | Admitting: Nurse Practitioner

## 2014-07-10 VITALS — BP 190/106 | HR 55 | Ht 70.0 in | Wt 269.8 lb

## 2014-07-10 DIAGNOSIS — I10 Essential (primary) hypertension: Secondary | ICD-10-CM | POA: Diagnosis not present

## 2014-07-10 DIAGNOSIS — R0600 Dyspnea, unspecified: Secondary | ICD-10-CM | POA: Diagnosis not present

## 2014-07-10 DIAGNOSIS — I259 Chronic ischemic heart disease, unspecified: Secondary | ICD-10-CM

## 2014-07-10 MED ORDER — AMLODIPINE BESYLATE 5 MG PO TABS: 5.0000 mg | ORAL_TABLET | Freq: Every day | ORAL | Status: DC

## 2014-07-10 NOTE — Progress Notes (Signed)
CARDIOLOGY OFFICE NOTE  Date:  07/10/2014    Leonard Edwards Date of Birth: 02-15-1955 Medical Record #[id number]  PCP:  Marton Redwood, MD  Cardiologist:  Burt Knack    Chief Complaint  Patient presents with  . Work in for dyspnea and elevated BP    Seen for Dr. Burt Knack.     History of Present Illness:    Leonard Edwards is a 59 y.o. male who presents today for a work in visit. Seen for Dr. Burt Knack. He is followed for coronary artery disease and hypertension. He initially presented several years ago with acute coronary syndrome and underwent PCI to right coronary artery. He developed recurrent anginal symptoms and underwent stenting of the ramus intermedius branch in 2011.  Seen by me a little over one year ago - had multiple complaints including neck, chest, and shoulder pain. Cardiac enzymes were checked and they were negative. A stress Myoview was performed with result outlined below. I started him on Norvasc for BP control.   Seen by Dr. Burt Knack last June and was improved. Was given the ok to proceed on with shoulder surgery.   Called today with complaints of elevated BP and some dyspnea. Thus added to the FLEX.  Comes in today. Here alone. He notes that he has noticed more fatigue and shortness of breath with his exercise. He says he has tried to continue to exercise on almost a daily basis but then admits that his work obligations get in his way. He has gained 15 pounds over the past year. Eating way too much salt - eats on the run and eats out. He did not notice that he is no longer on Norvasc - probably has not had for several months. BP quite high.  Past Medical History  Diagnosis Date  . CAD (coronary artery disease)     DES to distal RCA in 09/2004; DES to Ramus Intermediate 10/2009 following abnormal stress test  . Hyperlipidemia   . Hypertension   . GERD (gastroesophageal reflux disease)   . Hiatal hernia   . Gastric polyp   . Esophageal stricture   . Gout   . Benign  prostatic hypertrophy   . Obesity     No past surgical history on file.   Medications: Current Outpatient Prescriptions  Medication Sig Dispense Refill  . amLODipine (NORVASC) 5 MG tablet TAKE ONE TABLET EACH DAY 30 tablet 3  . Cholecalciferol (VITAMIN D3) 1000 UNITS CAPS Take 1,000 Units by mouth daily.      Marland Kitchen CIALIS 20 MG tablet TAKE ONE TABLET DAILY AS NEEDED FOR     ERECTILE DYSFUNCTION 10 tablet 5  . Coenzyme Q10 (CO Q 10) 100 MG CAPS Take 100 mg by mouth daily.      . fluticasone (FLONASE) 50 MCG/ACT nasal spray TWO SPRAYS DAILY IN NOSE (Patient taking differently: TWO SPRAYS DAILY IN NOSE prn) 16 g 0  . hydrochlorothiazide (HYDRODIURIL) 25 MG tablet TAKE ONE TABLET EACH DAY 30 tablet 0  . lisinopril (PRINIVIL,ZESTRIL) 40 MG tablet TAKE ONE TABLET EACH DAY 30 tablet 2  . LOVAZA 1 G capsule TAKE ONE CAPSULE TWICE A DAY 60 each 0  . metoprolol succinate (TOPROL-XL) 50 MG 24 hr tablet TAKE ONE TABLET TWICE DAILY 60 tablet 2  . montelukast (SINGULAIR) 10 MG tablet Take 10 mg by mouth at bedtime.    Marland Kitchen omeprazole (PRILOSEC) 40 MG capsule TAKE ONE CAPSULE EVERY MORNING 30 capsule 1  . rosuvastatin (CRESTOR) 10 MG  tablet Take 1 tablet (10 mg total) by mouth daily. 90 tablet 3  . allopurinol (ZYLOPRIM) 300 MG tablet Take 300 mg by mouth daily.     Marland Kitchen aspirin 81 MG tablet Take 81 mg by mouth daily.       No current facility-administered medications for this visit.    Allergies: No Known Allergies  Social History: The patient  reports that he has never smoked. He has never used smokeless tobacco. He reports that he drinks about 3.0 oz of alcohol per week. He reports that he does not use illicit drugs.   Family History: The patient's family history includes COPD in his mother; Diabetes in his sister; Heart attack in his father; Lupus in his sister. There is no history of Colon cancer, Pancreatic cancer, or Stomach cancer.   Review of Systems: Please see the history of present illness.    Otherwise, the review of systems is positive for none.   All other systems are reviewed and negative.   Physical Exam: VS:  BP 190/106 mmHg  Pulse 55  Ht 5\' 10"  (1.778 m)  Wt 269 lb 12.8 oz (122.38 kg)  BMI 38.71 kg/m2 .  BMI Body mass index is 38.71 kg/(m^2).  Wt Readings from Last 3 Encounters:  07/10/14 269 lb 12.8 oz (122.38 kg)  07/14/13 254 lb 6.4 oz (115.395 kg)  07/06/13 258 lb (117.028 kg)    General: Pleasant. Well developed, well nourished and in no acute distress. He has gained 15 pounds.  HEENT: Normal. Neck: Supple, no JVD, carotid bruits, or masses noted.  Cardiac: Regular rate and rhythm. No murmurs, rubs, or gallops. No edema.  Respiratory:  Lungs are clear to auscultation bilaterally with normal work of breathing.  GI: Soft and nontender.  MS: No deformity or atrophy. Gait and ROM intact. Skin: Warm and dry. Color is normal.  Neuro:  Strength and sensation are intact and no gross focal deficits noted.  Psych: Alert, appropriate and with normal affect.   LABORATORY DATA:  EKG:  EKG is ordered today. This demonstrates NSR.  Lab Results  Component Value Date   WBC 9.9 01/14/2013   HGB 16.0 01/14/2013   HCT 49.7 01/14/2013   PLT 267.0 10/25/2010   GLUCOSE 84 10/25/2010   CHOL 132 10/25/2010   TRIG 223.0* 10/25/2010   HDL 38.00* 10/25/2010   LDLDIRECT 68.2 10/25/2010   LDLCALC 102* 07/13/2008   ALT 42 10/25/2010   AST 34 10/25/2010   NA 143 10/25/2010   K 4.1 10/25/2010   CL 103 10/25/2010   CREATININE 0.9 10/25/2010   BUN 13 10/25/2010   CO2 31 10/25/2010   TSH 1.70 02/06/2010   PSA 1.03 08/29/2009   INR 1.1 ratio* 10/29/2009    BNP (last 3 results) No results for input(s): BNP in the last 8760 hours.  ProBNP (last 3 results) No results for input(s): PROBNP in the last 8760 hours.   Other Studies Reviewed Today:  Myoview Impression from 06/2013  Exercise Capacity: Good exercise capacity.  BP Response: Normal blood pressure response.   Clinical Symptoms: No chest pain.  ECG Impression: No significant ST segment change suggestive of ischemia.  Comparison with Prior Nuclear Study: No images to compare  Overall Impression: Low risk stress nuclear study. Small anteroseptal scar. No ischemia. Normal LV systolic function.  LV Ejection Fraction: 50%. LV Wall Motion: Normal Wall Motion   ASSESSMENT AND PLAN: 1. CAD, native vessel with prior PCIs. Stable Myoview last year. Some atypical symptoms. BP  quite high. Will treat BP and then see back to see if we need additional testing.   2. HTN - BP way up - restarting Norvasc today. See back in FLEX next week with me.  3. Hyperlipidemia - on lovaza and crestor. Will check fasting labs when I see him back (overdue on his physical).   Current medicines are reviewed with the patient today.  The patient does not have concerns regarding medicines other than what has been noted above.  The following changes have been made:  See above.  Labs/ tests ordered today include:   No orders of the defined types were placed in this encounter.     Disposition:   FU me in one week with fasting labs.   Patient is agreeable to this plan and will call if any problems develop in the interim.   Signed: Burtis Junes, RN, ANP-C 07/10/2014 2:55 PM  [facility] [address] [place], [place]  [address] Phone: [phone] Fax: [phone]

## 2014-07-10 NOTE — Telephone Encounter (Signed)
The pt is coming into the office today for appointment due to elevated BP.

## 2014-07-10 NOTE — Telephone Encounter (Signed)
New Prob   Pt c/o BP issue: STAT if pt c/o blurred vision, one-sided weakness or slurred speech  1. What are your last 5 BP readings? 212/135 (prior to call) 170/99 (this morning) 175/100 (lastnight) 190/107 (yesterday) 207/109 (lunch time today)  2. Are you having any other symptoms (ex. Dizziness, headache, blurred vision, passed out)? Mild shortness of breath when walking  3. What is your BP issue? Pt is concerned his BP is too high

## 2014-07-10 NOTE — Patient Instructions (Addendum)
We will be checking the following labs today - NONE   Medication Instructions:    Continue with your current medicines.   I refilled the Norvasc today - start back on tonight    Testing/Procedures To Be Arranged:  N/A  Follow-Up:   I will see you back in one week with fasting labs    Other Special Instructions:   Monitor your blood pressure at home  Bring your cuff for [place] to check  We will check fasting labs on return  Call the [place] office at [phone] if you have any questions, problems or concerns.

## 2014-07-10 NOTE — Telephone Encounter (Signed)
I spoke with the pt's wife and made her aware that we can see the pt this afternoon at 2:30.  The pt's wife will contact the pt and make him aware of appointment.

## 2014-07-13 ENCOUNTER — Encounter: Payer: Self-pay | Admitting: *Deleted

## 2014-07-17 ENCOUNTER — Encounter: Payer: Self-pay | Admitting: Nurse Practitioner

## 2014-07-17 ENCOUNTER — Ambulatory Visit (INDEPENDENT_AMBULATORY_CARE_PROVIDER_SITE_OTHER): Payer: BLUE CROSS/BLUE SHIELD | Admitting: Nurse Practitioner

## 2014-07-17 VITALS — BP 170/104 | HR 63 | Ht 70.0 in | Wt 260.6 lb

## 2014-07-17 DIAGNOSIS — E785 Hyperlipidemia, unspecified: Secondary | ICD-10-CM | POA: Diagnosis not present

## 2014-07-17 DIAGNOSIS — I259 Chronic ischemic heart disease, unspecified: Secondary | ICD-10-CM

## 2014-07-17 DIAGNOSIS — I10 Essential (primary) hypertension: Secondary | ICD-10-CM | POA: Diagnosis not present

## 2014-07-17 LAB — BASIC METABOLIC PANEL
BUN: 14 mg/dL (ref 6–23)
CO2: 26 mEq/L (ref 19–32)
Calcium: 9.7 mg/dL (ref 8.4–10.5)
Chloride: 102 mEq/L (ref 96–112)
Creatinine, Ser: 0.74 mg/dL (ref 0.40–1.50)
GFR: 115.3 mL/min (ref >60.00)
Glucose, Bld: 104 mg/dL — ABNORMAL HIGH (ref 70–99)
Potassium: 3.5 mEq/L (ref 3.5–5.1)
Sodium: 138 mEq/L (ref 135–145)

## 2014-07-17 LAB — LIPID PANEL
Cholesterol: 133 mg/dL (ref 0–200)
HDL: 34.6 mg/dL — ABNORMAL LOW (ref >39.00)
LDL Cholesterol: 74 mg/dL (ref 0–99)
NonHDL: 98.4
Total CHOL/HDL Ratio: 4
Triglycerides: 121 mg/dL (ref 0.0–149.0)
VLDL: 24.2 mg/dL (ref 0.0–40.0)

## 2014-07-17 LAB — CBC
HCT: 48.3 % (ref 39.0–52.0)
Hemoglobin: 16.4 g/dL (ref 13.0–17.0)
MCHC: 34 g/dL (ref 30.0–36.0)
MCV: 90.1 fl (ref 78.0–100.0)
Platelets: 288 10*3/uL (ref 150.0–400.0)
RBC: 5.36 Mil/uL (ref 4.22–5.81)
RDW: 13.2 % (ref 11.5–15.5)
WBC: 9.9 10*3/uL (ref 4.0–10.5)

## 2014-07-17 LAB — HEPATIC FUNCTION PANEL
ALT: 30 U/L (ref 0–53)
AST: 26 U/L (ref 0–37)
Albumin: 4.4 g/dL (ref 3.5–5.2)
Alkaline Phosphatase: 79 U/L (ref 39–117)
Bilirubin, Direct: 0.2 mg/dL (ref 0.0–0.3)
Total Bilirubin: 0.9 mg/dL (ref 0.2–1.2)
Total Protein: 7.9 g/dL (ref 6.0–8.3)

## 2014-07-17 LAB — TSH: TSH: 1.81 u[IU]/mL (ref 0.35–4.50)

## 2014-07-17 MED ORDER — AMLODIPINE BESYLATE 10 MG PO TABS: 10.0000 mg | ORAL_TABLET | Freq: Every day | ORAL | Status: DC

## 2014-07-17 NOTE — Patient Instructions (Addendum)
We will be checking the following labs today - BMET, CBC, TSH, HPF, & Lipids   Medication Instructions:    Continue with your current medicines but  I am increasing the Norvasc to 10 mg a day    Testing/Procedures To Be Arranged:  N/A  Follow-Up:   I will see you back in 2 to 3 weeks    Other Special Instructions:   Try to stay on track with diet/exercise/salt restriction, etc.   Call the [place] office at [phone] if you have any questions, problems or concerns.

## 2014-07-17 NOTE — Progress Notes (Signed)
CARDIOLOGY OFFICE NOTE  Date:  07/17/2014    Leonard Edwards Date of Birth: Mar 18, 1955 Medical Record #[id number]  PCP:  Marton Redwood, MD  Cardiologist:  Burt Knack    Chief Complaint  Patient presents with  . Hypertension    Follow up visit - seen for Dr. Burt Knack    History of Present Illness: Leonard Edwards is a 59 y.o. male who presents today for a follow up visit. Seen for Dr. Burt Knack. He is followed for coronary artery disease and hypertension. He initially presented several years ago with acute coronary syndrome and underwent PCI to right coronary artery. He developed recurrent anginal symptoms and underwent stenting of the ramus intermedius branch in 2011.  Seen by me a little over one year ago - had multiple complaints including neck, chest, and shoulder pain. Cardiac enzymes were checked and they were negative. A stress Myoview was performed with result outlined below. I started him on Norvasc for BP control.   Seen by Dr. Burt Knack last June and was improved. Was given the ok to proceed on with shoulder surgery.   He called last week with complaints of elevated BP and some dyspnea. Thus added to the FLEX. He was not taking his Norvasc anymore - had not refilled. I put him back on the Norvasc.  He had gained weight. Getting too much salt.   Comes in today. Here alone. he notes that his BP has improved but still not at goal. He did not take any of his medicines today. He is back on track with salt restriction, exercise, etc. Has lost 9 pounds since last week. No chest pain. He notes that he feels better overall and is not having chest pain, fatigue or dyspnea at this time. He is fasting today.   Past Medical History  Diagnosis Date  . CAD (coronary artery disease)     DES to distal RCA in 09/2004; DES to Ramus Intermediate 10/2009 following abnormal stress test  . Hyperlipidemia   . Hypertension   . GERD (gastroesophageal reflux disease)   . Hiatal hernia   . Gastric polyp     . Esophageal stricture   . Gout   . Benign prostatic hypertrophy   . Obesity     Past Surgical History  Procedure Laterality Date  . Des to distal rca in 09/2004; des to ramus intermediate 10/2009 following abnormal stress test  2006, 2011     Medications: Current Outpatient Prescriptions  Medication Sig Dispense Refill  . allopurinol (ZYLOPRIM) 300 MG tablet Take 300 mg by mouth daily.     Marland Kitchen amLODipine (NORVASC) 5 MG tablet Take 1 tablet (5 mg total) by mouth daily. 30 tablet 6  . aspirin 81 MG tablet Take 81 mg by mouth daily.      . Cholecalciferol (VITAMIN D3) 1000 UNITS CAPS Take 1,000 Units by mouth daily.      Marland Kitchen CIALIS 20 MG tablet TAKE ONE TABLET DAILY AS NEEDED FOR     ERECTILE DYSFUNCTION 10 tablet 5  . Coenzyme Q10 (CO Q 10) 100 MG CAPS Take 100 mg by mouth daily.      . fluticasone (FLONASE) 50 MCG/ACT nasal spray TWO SPRAYS DAILY IN NOSE (Patient taking differently: TWO SPRAYS DAILY IN NOSE prn) 16 g 0  . hydrochlorothiazide (HYDRODIURIL) 25 MG tablet TAKE ONE TABLET EACH DAY 30 tablet 0  . lisinopril (PRINIVIL,ZESTRIL) 40 MG tablet TAKE ONE TABLET EACH DAY 30 tablet 2  . LOVAZA 1 G  capsule TAKE ONE CAPSULE TWICE A DAY 60 each 0  . metoprolol succinate (TOPROL-XL) 50 MG 24 hr tablet TAKE ONE TABLET TWICE DAILY 60 tablet 2  . montelukast (SINGULAIR) 10 MG tablet Take 10 mg by mouth at bedtime.    Marland Kitchen omeprazole (PRILOSEC) 40 MG capsule TAKE ONE CAPSULE EVERY MORNING 30 capsule 1  . rosuvastatin (CRESTOR) 10 MG tablet Take 1 tablet (10 mg total) by mouth daily. 90 tablet 3   No current facility-administered medications for this visit.    Allergies: No Known Allergies  Social History: The patient  reports that he has never smoked. He has never used smokeless tobacco. He reports that he drinks about 3.0 oz of alcohol per week. He reports that he does not use illicit drugs.   Family History: The patient's family history includes COPD in his mother; Diabetes type II in  his sister; Heart attack in his father; Lupus in his sister. There is no history of Colon cancer, Pancreatic cancer, or Stomach cancer.   Review of Systems: Please see the history of present illness.   Otherwise, the review of systems is positive for none.   All other systems are reviewed and negative.   Physical Exam: VS:  BP 170/104 mmHg  Pulse 63  Ht 5\' 10"  (1.778 m)  Wt 260 lb 9.6 oz (118.207 kg)  BMI 37.39 kg/m2  SpO2 97% .  BMI Body mass index is 37.39 kg/(m^2).  Wt Readings from Last 3 Encounters:  07/17/14 260 lb 9.6 oz (118.207 kg)  07/10/14 269 lb 12.8 oz (122.38 kg)  07/14/13 254 lb 6.4 oz (115.395 kg)   Repeat BP by me is 150/90.  General: Pleasant. Well developed, well nourished and in no acute distress.  HEENT: Normal. Neck: Supple, no JVD, carotid bruits, or masses noted.  Cardiac: Regular rate and rhythm. No murmurs, rubs, or gallops. No edema.  Respiratory:  Lungs are clear to auscultation bilaterally with normal work of breathing.  GI: Soft and nontender.  MS: No deformity or atrophy. Gait and ROM intact. Skin: Warm and dry. Color is normal.  Neuro:  Strength and sensation are intact and no gross focal deficits noted.  Psych: Alert, appropriate and with normal affect.   LABORATORY DATA:  EKG:  EKG is not ordered today.  Lab Results  Component Value Date   WBC 9.9 01/14/2013   HGB 16.0 01/14/2013   HCT 49.7 01/14/2013   PLT 267.0 10/25/2010   GLUCOSE 84 10/25/2010   CHOL 132 10/25/2010   TRIG 223.0* 10/25/2010   HDL 38.00* 10/25/2010   LDLDIRECT 68.2 10/25/2010   LDLCALC 102* 07/13/2008   ALT 42 10/25/2010   AST 34 10/25/2010   NA 143 10/25/2010   K 4.1 10/25/2010   CL 103 10/25/2010   CREATININE 0.9 10/25/2010   BUN 13 10/25/2010   CO2 31 10/25/2010   TSH 1.70 02/06/2010   PSA 1.03 08/29/2009   INR 1.1 ratio* 10/29/2009    BNP (last 3 results) No results for input(s): BNP in the last 8760 hours.  ProBNP (last 3 results) No results for  input(s): PROBNP in the last 8760 hours.   Other Studies Reviewed Today: Myoview Impression from 06/2013  Exercise Capacity: Good exercise capacity.  BP Response: Normal blood pressure response.  Clinical Symptoms: No chest pain.  ECG Impression: No significant ST segment change suggestive of ischemia.  Comparison with Prior Nuclear Study: No images to compare  Overall Impression: Low risk stress nuclear study. Small anteroseptal  scar. No ischemia. Normal LV systolic function.  LV Ejection Fraction: 50%. LV Wall Motion: Normal Wall Motion   ASSESSMENT AND PLAN:  1. CAD, native vessel with prior PCIs. Stable Myoview last year. Some atypical symptoms. This has improved with treating his BP. For now, will continue with medical management.  2. HTN - Norvasc restarted last week - BP improving. Will increase to 10 mg of Norvasc - he is cautioned about edema. Needs to continue with salt restriction, exercise etc. He will continue to monitor at home as well. He would like to be seen back more frequently - helps hold him accountable to taking care of himself.   3. Hyperlipidemia - on lovaza and crestor. Will check fasting labs today and send to his PCP (was overdue for his physical).   Current medicines are reviewed with the patient today.  The patient does not have concerns regarding medicines other than what has been noted above.  The following changes have been made:  See above.  Labs/ tests ordered today include:    Orders Placed This Encounter  Procedures  . Basic metabolic panel  . Hepatic function panel  . Lipid panel  . TSH  . CBC     Disposition:   FU with me in 3 to 4 weeks.    Patient is agreeable to this plan and will call if any problems develop in the interim.   Signed: Burtis Junes, RN, ANP-C 07/17/2014 8:13 AM  [facility] [address] [place], [place]  [address] Phone: [phone] Fax: [phone]

## 2014-08-16 ENCOUNTER — Ambulatory Visit: Payer: BLUE CROSS/BLUE SHIELD | Admitting: Nurse Practitioner

## 2014-08-17 ENCOUNTER — Other Ambulatory Visit: Payer: Self-pay | Admitting: Cardiovascular Disease

## 2014-08-18 ENCOUNTER — Telehealth: Payer: Self-pay | Admitting: *Deleted

## 2014-08-18 NOTE — Telephone Encounter (Signed)
lvm regarding making f/u appointment per [name], NP

## 2014-08-21 NOTE — Telephone Encounter (Signed)
S/w pt to schedule appointment.  Pt agreeable to plan.

## 2014-08-25 ENCOUNTER — Encounter: Payer: Self-pay | Admitting: Internal Medicine

## 2014-09-05 ENCOUNTER — Telehealth: Payer: Self-pay | Admitting: Internal Medicine

## 2014-09-05 NOTE — Telephone Encounter (Signed)
Leonard Edwards complained of foot "rash" with some discomfort but no fever. I saw him. He has a unilateral cellulitis ,mild to moderate. Prescribed Cipro 500 mg bid x 10 days. Instructed to see PCP if it worsens or does not resolve. He agrees.

## 2014-09-12 ENCOUNTER — Ambulatory Visit: Payer: BLUE CROSS/BLUE SHIELD | Admitting: Nurse Practitioner

## 2014-09-18 ENCOUNTER — Ambulatory Visit: Payer: BLUE CROSS/BLUE SHIELD | Admitting: Nurse Practitioner

## 2014-10-13 ENCOUNTER — Telehealth: Payer: Self-pay | Admitting: *Deleted

## 2014-10-13 NOTE — Telephone Encounter (Signed)
Follow up   i asked pt to move appt to the 9/13 or 9/19 pt declined and states that   He will be out of town have RN call if they can put him in 9/20 through 9/23  with Dr.Ross

## 2014-10-13 NOTE — Telephone Encounter (Signed)
Left message on machine for pt to contact the office about moving appointment with [name], NP, per Cecille Rubin to Monday, September 19 @ 8:00 or September 13 @ 10:00

## 2014-10-16 ENCOUNTER — Telehealth: Payer: Self-pay | Admitting: *Deleted

## 2014-10-16 NOTE — Telephone Encounter (Signed)
Pt called to switch appointments, wanted to see [name], Np back, made and confirmed appointment.

## 2014-10-18 ENCOUNTER — Ambulatory Visit: Payer: BLUE CROSS/BLUE SHIELD | Admitting: Physician Assistant

## 2014-10-31 ENCOUNTER — Encounter: Payer: Self-pay | Admitting: Nurse Practitioner

## 2014-10-31 ENCOUNTER — Ambulatory Visit (INDEPENDENT_AMBULATORY_CARE_PROVIDER_SITE_OTHER): Payer: BLUE CROSS/BLUE SHIELD | Admitting: Nurse Practitioner

## 2014-10-31 VITALS — BP 170/100 | HR 68 | Ht 70.0 in | Wt 266.0 lb

## 2014-10-31 DIAGNOSIS — I259 Chronic ischemic heart disease, unspecified: Secondary | ICD-10-CM | POA: Diagnosis not present

## 2014-10-31 DIAGNOSIS — I10 Essential (primary) hypertension: Secondary | ICD-10-CM

## 2014-10-31 DIAGNOSIS — E785 Hyperlipidemia, unspecified: Secondary | ICD-10-CM | POA: Diagnosis not present

## 2014-10-31 NOTE — Patient Instructions (Addendum)
We will be checking the following labs today - NONE   Medication Instructions:    Continue with your current medicines.     Testing/Procedures To Be Arranged:  N/A  Follow-Up:   See me in 6 to 8 weeks to recheck    Other Special Instructions:   N/A  Call the [facility] office at [phone] if you have any questions, problems or concerns.

## 2014-10-31 NOTE — Progress Notes (Signed)
CARDIOLOGY OFFICE NOTE  Date:  10/31/2014    Leonard Edwards Date of Birth: [birth year] Medical Record #[id number]  PCP:  Marton Redwood, MD  Cardiologist:  Burt Knack    Chief Complaint  Patient presents with  . Coronary Artery Disease    3 month check - seen for Dr. Burt Knack  . Hypertension    History of Present Illness: Leonard Edwards is a 59 y.o. male who presents today for a 3 month check. Seen for Dr. Burt Knack. He is followed for coronary artery disease and hypertension. He initially presented several years ago with acute coronary syndrome and underwent PCI to right coronary artery. He developed recurrent anginal symptoms and underwent stenting of the ramus intermedius branch in 2011.  Seen by myself in 2015 - had multiple complaints including neck, chest, and shoulder pain. Cardiac enzymes were checked and they were negative. A stress Myoview was performed with result outlined below. I started him on Norvasc for BP control.   Seen by Dr. Burt Knack last June and was improved. Was given the ok to proceed on with shoulder surgery.   I have seen him several times this past spring/summer. BP up. Way off track with taking care of himself. This has been a chronic issue we have had to deal with.   Comes in today. Here alone. He has had a very busy summer - mother died in September 01, 2022 and she was in and out of the hospital. Gained back weight. Exercising sporadically. No actual chest discomfort but keeps telling me "I'm watching it". He notes that if he misses his morning medicines that when he exercises at night he will feel "tight". This does not happen if he does not miss his medicines but again says "I'm watching it". Unclear as to how many times he misses his morning medicines. [name] he has just taken them for today. Has had recent sinus infection - does not sound like he has used decongestants. Lots of allergies - currently getting his kitchen remodeled. Has had recent physical with Dr. Brigitte Pulse - says his  BP was good at that visit. Typically running around 140/90 at home.   Past Medical History  Diagnosis Date  . CAD (coronary artery disease)     DES to distal RCA in 09/2004; DES to Ramus Intermediate 10/2009 following abnormal stress test  . Hyperlipidemia   . Hypertension   . GERD (gastroesophageal reflux disease)   . Hiatal hernia   . Gastric polyp   . Esophageal stricture   . Gout   . Benign prostatic hypertrophy   . Obesity     Past Surgical History  Procedure Laterality Date  . Des to distal rca in 09/2004; des to ramus intermediate 10/2009 following abnormal stress test  2006, 2011     Medications: Current Outpatient Prescriptions  Medication Sig Dispense Refill  . allopurinol (ZYLOPRIM) 300 MG tablet Take 300 mg by mouth daily.     Marland Kitchen amLODipine (NORVASC) 10 MG tablet Take 1 tablet (10 mg total) by mouth daily. 90 tablet 3  . aspirin 81 MG tablet Take 81 mg by mouth daily.      . Cholecalciferol (VITAMIN D3) 1000 UNITS CAPS Take 1,000 Units by mouth daily.      Marland Kitchen CIALIS 20 MG tablet TAKE ONE TABLET DAILY AS NEEDED FOR     ERECTILE DYSFUNCTION 10 tablet 5  . Coenzyme Q10 (CO Q 10) 100 MG CAPS Take 100 mg by mouth daily.      Marland Kitchen  fluticasone (FLONASE) 50 MCG/ACT nasal spray TWO SPRAYS DAILY IN NOSE (Patient taking differently: TWO SPRAYS DAILY IN NOSE prn) 16 g 0  . hydrochlorothiazide (HYDRODIURIL) 25 MG tablet TAKE ONE TABLET EACH DAY 30 tablet 0  . lisinopril (PRINIVIL,ZESTRIL) 40 MG tablet TAKE ONE TABLET EACH DAY 30 tablet 2  . LOVAZA 1 G capsule TAKE ONE CAPSULE TWICE A DAY 60 each 0  . metoprolol succinate (TOPROL-XL) 50 MG 24 hr tablet TAKE ONE TABLET TWICE DAILY 60 tablet 0  . montelukast (SINGULAIR) 10 MG tablet Take 10 mg by mouth at bedtime.    Marland Kitchen omeprazole (PRILOSEC) 40 MG capsule TAKE ONE CAPSULE EVERY MORNING 30 capsule 1  . rosuvastatin (CRESTOR) 10 MG tablet Take 1 tablet (10 mg total) by mouth daily. 90 tablet 3   No current facility-administered medications  for this visit.    Allergies: No Known Allergies  Social History: The patient  reports that he has never smoked. He has never used smokeless tobacco. He reports that he drinks about 3.0 oz of alcohol per week. He reports that he does not use illicit drugs.   Family History: The patient's family history includes COPD in his mother; Diabetes type II in his sister; Heart attack in his father; Lupus in his sister. There is no history of Colon cancer, Pancreatic cancer, or Stomach cancer.   Review of Systems: Please see the history of present illness.   Otherwise, the review of systems is positive for none.   All other systems are reviewed and negative.   Physical Exam: VS:  BP 170/100 mmHg  Pulse 68  Ht 5\' 10"  (1.778 m)  Wt 266 lb (120.657 kg)  BMI 38.17 kg/m2  SpO2 97% .  BMI Body mass index is 38.17 kg/(m^2).  Wt Readings from Last 3 Encounters:  10/31/14 266 lb (120.657 kg)  07/17/14 260 lb 9.6 oz (118.207 kg)  07/10/14 269 lb 12.8 oz (122.38 kg)   BP by me is 160/90 with a large cuff.   General: Pleasant. Well developed, well nourished and in no acute distress. Weight is up. He remains obese.  HEENT: Normal. Neck: Supple, no JVD, carotid bruits, or masses noted.  Cardiac: Regular rate and rhythm. No murmurs, rubs, or gallops. No edema.  Respiratory:  Lungs are clear to auscultation bilaterally with normal work of breathing.  GI: Soft and nontender.  MS: No deformity or atrophy. Gait and ROM intact. Skin: Warm and dry. Color is normal.  Neuro:  Strength and sensation are intact and no gross focal deficits noted.  Psych: Alert, appropriate and with normal affect.   LABORATORY DATA:  EKG:  EKG is not ordered today.   Lab Results  Component Value Date   WBC 9.9 07/17/2014   HGB 16.4 07/17/2014   HCT 48.3 07/17/2014   PLT 288.0 07/17/2014   GLUCOSE 104* 07/17/2014   CHOL 133 07/17/2014   TRIG 121.0 07/17/2014   HDL 34.60* 07/17/2014   LDLDIRECT 68.2 10/25/2010    LDLCALC 74 07/17/2014   ALT 30 07/17/2014   AST 26 07/17/2014   NA 138 07/17/2014   K 3.5 07/17/2014   CL 102 07/17/2014   CREATININE 0.74 07/17/2014   BUN 14 07/17/2014   CO2 26 07/17/2014   TSH 1.81 07/17/2014   PSA 1.03 08/29/2009   INR 1.1 ratio* 10/29/2009    BNP (last 3 results) No results for input(s): BNP in the last 8760 hours.  ProBNP (last 3 results) No results for input(s): PROBNP  in the last 8760 hours.   Other Studies Reviewed Today:  Myoview Impression from 06/2013  Exercise Capacity: Good exercise capacity.  BP Response: Normal blood pressure response.  Clinical Symptoms: No chest pain.  ECG Impression: No significant ST segment change suggestive of ischemia.  Comparison with Prior Nuclear Study: No images to compare  Overall Impression: Low risk stress nuclear study. Small anteroseptal scar. No ischemia. Normal LV systolic function.  LV Ejection Fraction: 50%. LV Wall Motion: Normal Wall Motion   ASSESSMENT AND PLAN:  1. CAD, native vessel with prior PCIs. Stable Myoview last year. Some atypical symptoms. He wants to see how he can do with trying to work on his lifestyle/weight. Will see him back in 6 to 8 weeks - if his symptoms persist/change - may need to entertain repeat cardiac cath. Unfortunately, I do not see him making his personal health a priority.  2. HTN - Needs to continue with salt restriction, exercise etc. He will continue to monitor at home as well. He has better control (though not ideal) at home.   3. Hyperlipidemia - on lovaza and crestor. Has had recent labs from Dr. Brigitte Pulse  4. Obesity - long standing problem.   Current medicines are reviewed with the patient today.  The patient does not have concerns regarding medicines other than what has been noted above.  The following changes have been made:  See above.  Labs/ tests ordered today include:   No orders of the defined types were placed in this encounter.     Disposition:    FU with me in 6 to 8 weeks.  Patient is agreeable to this plan and will call if any problems develop in the interim.   Signed: Burtis Junes, RN, ANP-C 10/31/2014 10:37 AM  [facility] [address] [place], [place]  [address] Phone: [phone] Fax: [phone]

## 2014-12-14 ENCOUNTER — Encounter: Payer: Self-pay | Admitting: Neurology

## 2014-12-14 ENCOUNTER — Ambulatory Visit (INDEPENDENT_AMBULATORY_CARE_PROVIDER_SITE_OTHER): Payer: BLUE CROSS/BLUE SHIELD | Admitting: Neurology

## 2014-12-14 ENCOUNTER — Encounter (INDEPENDENT_AMBULATORY_CARE_PROVIDER_SITE_OTHER): Payer: Self-pay

## 2014-12-14 VITALS — BP 144/82 | HR 76 | Resp 18 | Ht 70.0 in | Wt 267.0 lb

## 2014-12-14 DIAGNOSIS — R51 Headache: Secondary | ICD-10-CM | POA: Diagnosis not present

## 2014-12-14 DIAGNOSIS — R519 Headache, unspecified: Secondary | ICD-10-CM

## 2014-12-14 DIAGNOSIS — G4700 Insomnia, unspecified: Secondary | ICD-10-CM

## 2014-12-14 DIAGNOSIS — R0683 Snoring: Secondary | ICD-10-CM | POA: Diagnosis not present

## 2014-12-14 DIAGNOSIS — G478 Other sleep disorders: Secondary | ICD-10-CM | POA: Diagnosis not present

## 2014-12-14 DIAGNOSIS — R351 Nocturia: Secondary | ICD-10-CM | POA: Diagnosis not present

## 2014-12-14 DIAGNOSIS — E669 Obesity, unspecified: Secondary | ICD-10-CM | POA: Diagnosis not present

## 2014-12-14 NOTE — Progress Notes (Signed)
Subjective:    Patient ID: Leonard Edwards is a 59 y.o. male.  HPI     Star Age, MD, PhD [facility] [address] [place], [place] [address]  Dear Dr. Brigitte Pulse,   I saw your patient, Leonard Edwards, upon your kind request, in my neurologic clinic today for initial consultation of his sleep disorder, in particular, concern for underlying obstructive sleep apnea. The patient is unaccompanied today. As you know, Mr. Cotrone is a 59 year old right-handed gentleman with an underlying medical history of hypertension, hyperlipidemia, gout, coronary artery disease, status post stent placement 2, and a prior diagnosis of fibromyalgia, low back pain and obesity, who reports snoring and excessive daytime somnolence, nonrestorative sleep, sleep disruption, and difficulty maintaining sleep. I reviewed your office note from 11/23/2014, which you kindly included. He had a sleep study about 6 years ago which at that time was negative for obstructive sleep apnea as I understand. Prior sleep test results are not available for my review. He reports a bedtime of around 10 PM. He watches TV in bed for about an hour. He falls asleep around 11 PM. He does not take any sleeping pills but was recently started on Cymbalta which is currently at 60 mg each night. His rise time is around 6 AM. He does not wake up rested. While he denies restless leg symptoms or leg twitching at night he does sometimes have cramps. He also has discomfort at night particularly with bilateral shoulder pain and thigh pain and low back pain as well as hip pains. He  Hi primarily sleeps on his stomach in both sides but does not maintain one sleep position. He has gained weight since his prior sleep study. He has no family history of OSA. He has nocturia about once on average per night. He takes no naps. He has an Epworth sleepiness score of 2 out of 24, fatigue score is 29 out of 63 today. He works full-time as  an Chief Financial Officer. He reports neck pain and low back pain. He wakes up often with a dull headache, up to 4 times a week. This usually goes away after an hour or so. It seems to come from his neck pain he believes. He has occasional head and hand tremors. He has had tremors for years and saw Dr. Erling Cruz in the past in this office for evaluation of his neck pain and tremors.   Past Medical History Is Significant For: Past Medical History  Diagnosis Date  . CAD (coronary artery disease)     DES to distal RCA in 09/2004; DES to Ramus Intermediate 10/2009 following abnormal stress test  . Hyperlipidemia   . Hypertension   . GERD (gastroesophageal reflux disease)   . Hiatal hernia   . Gastric polyp   . Esophageal stricture   . Gout   . Benign prostatic hypertrophy   . Obesity   . Fibromyalgia     Her Past Surgical History Is Significant For: Past Surgical History  Procedure Laterality Date  . Des to distal rca in 09/2004; des to ramus intermediate 10/2009 following abnormal stress test  2006, 2011    His Family History Is Significant For: Family History  Problem Relation Age of Onset  . Heart attack Father   . Lupus Sister   . Diabetes type II Sister   . Colon cancer Neg Hx   . Pancreatic cancer Neg Hx   . Stomach cancer Neg Hx   . COPD Mother  His Social History Is Significant For: Social History   Social History  . Marital Status: Married    Spouse Name: N/A  . Number of Children: 4  . Years of Education: College   Occupational History  .  [employer] trainer   Social History Main Topics  . Smoking status: Never Smoker   . Smokeless tobacco: Never Used  . Alcohol Use: 3.0 oz/week    5 Glasses of wine per week  . Drug Use: No  . Sexual Activity: Not Asked   Other Topics Concern  . None   Social History Narrative    His Allergies Are:  No Known Allergies:   His Current Medications Are:  Outpatient Encounter Prescriptions as of 12/14/2014  Medication Sig  . allopurinol  (ZYLOPRIM) 300 MG tablet Take 300 mg by mouth daily.   Marland Kitchen amLODipine (NORVASC) 5 MG tablet   . ANDROGEL PUMP 20.25 MG/ACT (1.62%) GEL   . aspirin 81 MG tablet Take 81 mg by mouth daily.    . Cholecalciferol (VITAMIN D3) 1000 UNITS CAPS Take 1,000 Units by mouth daily.    Marland Kitchen CIALIS 20 MG tablet TAKE ONE TABLET DAILY AS NEEDED FOR     ERECTILE DYSFUNCTION  . Coenzyme Q10 (CO Q 10) 100 MG CAPS Take 100 mg by mouth daily.    . DULoxetine (CYMBALTA) 30 MG capsule   . DULoxetine (CYMBALTA) 60 MG capsule   . fluticasone (FLONASE) 50 MCG/ACT nasal spray TWO SPRAYS DAILY IN NOSE (Patient taking differently: TWO SPRAYS DAILY IN NOSE prn)  . hydrochlorothiazide (HYDRODIURIL) 25 MG tablet TAKE ONE TABLET EACH DAY  . lisinopril (PRINIVIL,ZESTRIL) 40 MG tablet TAKE ONE TABLET EACH DAY  . LOVAZA 1 G capsule TAKE ONE CAPSULE TWICE A DAY  . metoprolol succinate (TOPROL-XL) 50 MG 24 hr tablet TAKE ONE TABLET TWICE DAILY  . montelukast (SINGULAIR) 10 MG tablet Take 10 mg by mouth at bedtime.  Marland Kitchen omeprazole (PRILOSEC) 40 MG capsule TAKE ONE CAPSULE EVERY MORNING  . rosuvastatin (CRESTOR) 10 MG tablet Take 1 tablet (10 mg total) by mouth daily.  . [DISCONTINUED] amLODipine (NORVASC) 10 MG tablet Take 1 tablet (10 mg total) by mouth daily.   No facility-administered encounter medications on file as of 12/14/2014.  :  Review of Systems:  Out of a complete 14 point review of systems, all are reviewed and negative with the exception of these symptoms as listed below:  Review of Systems  Neurological:       Patient has pain in R shoulder when sleeping. Trouble falling asleep,   Epworth Sleepiness Scale 0= would never doze 1= slight chance of dozing 2= moderate chance of dozing 3= high chance of dozing  Sitting and reading:1 Watching TV:1 Sitting inactive in a public place (ex. Theater or meeting):0 As a passenger in a car for an hour without a break:0 Sitting and talking to someone:0 Sitting quietly after  lunch (no alcohol):0 In a car, while stopped in traffic:0 Total: 2  Objective:  Neurologic Exam  Physical Exam Physical Examination:   Filed Vitals:   12/14/14 1324  BP: 144/82  Pulse: 76  Resp: 18    General Examination: The patient is a very pleasant 59 y.o. male in no acute distress. He appears well-developed and well-nourished and well groomed.   HEENT: Normocephalic, atraumatic, pupils are equal, round and reactive to light and accommodation. Funduscopic exam is normal with sharp disc margins noted. Extraocular tracking is good without limitation to gaze excursion or nystagmus  noted. Normal smooth pursuit is noted. Hearing is grossly intact. Tympanic membranes are clear bilaterally. Face is symmetric with normal facial animation and normal facial sensation. Speech is clear with no dysarthria noted. There is no hypophonia. There is no lip, neck/head, jaw or voice tremor. Neck is supple with full range of passive and active motion. There are no carotid bruits on auscultation. Oropharynx exam reveals: mild mouth dryness, adequate dental hygiene and moderate airway crowding, due to longer tongue, large uvula and thicker soft palate, tonsils in place of about 2+ bilaterally and uvula appears to be swollen. Posterior pharynx appears to be inflamed and irritated looking. He says he recently had hoarseness an allergy flareup. Mallampati is class II. Tongue protrudes centrally and palate elevates symmetrically. Neck size is 19.75 inches. He has a Mild overbite. Nasal inspection reveals no significant nasal mucosal bogginess or redness and no septal deviation.   Chest: Clear to auscultation without wheezing, rhonchi or crackles noted.  Heart: S1+S2+0, regular and normal without murmurs, rubs or gallops noted.   Abdomen: Soft, non-tender and non-distended with normal bowel sounds appreciated on auscultation.  Extremities: There is no pitting edema in the distal lower extremities bilaterally.  Pedal pulses are intact.  Skin: Warm and dry without trophic changes noted. There are no varicose veins.  Musculoskeletal: exam reveals no obvious joint deformities, tenderness or joint swelling or erythema, but he reports b/l shoulder pain.   Neurologically:  Mental status: The patient is awake, alert and oriented in all 4 spheres. His immediate and remote memory, attention, language skills and fund of knowledge are appropriate. There is no evidence of  aphasia, agnosia, apraxia or anomia. Speech is clear with normal prosody and enunciation. Thought process is linear. Mood is normal and affect is normal.  Cranial nerves II - XII are as described above under HEENT exam. In addition: shoulder shrug is normal with equal shoulder height noted. Motor exam: Normal bulk, strength and tone is noted. There is no drift, rest tremor or rebound. He has a minimal b/l UE postural and action tremor. Romberg is negative. Reflexes are 1+ throughout. Babinski: Toes are flexor bilaterally. Fine motor skills and coordination: intact with normal finger taps, normal hand movements, normal rapid alternating patting, normal foot taps and normal foot agility.  Cerebellar testing: No dysmetria or intention tremor on finger to nose testing. Heel to shin is unremarkable bilaterally. There is no truncal or gait ataxia.  Sensory exam: intact to light touch in the upper and lower extremities.  Gait, station and balance: He stands easily. No veering to one side is noted. No leaning to one side is noted. Posture is age-appropriate and stance is narrow based. Gait shows normal stride length and normal pace. No problems turning are noted. He turns en bloc. Tandem walk is unremarkable.   Assessment and Plan:   In summary, Clare SKYLAND FEIST is a very pleasant 58 y.o.-year old male with an underlying medical history of hypertension, hyperlipidemia, gout, coronary artery disease, status post stent placement 2, and a prior diagnosis of  fibromyalgia, low back pain and obesity, whose history and particularly the physical exam are concerning for obstructive sleep apnea (OSA). I had a long chat with the patient about my findings and the diagnosis of OSA, its prognosis and treatment options. We talked about medical treatments, surgical interventions and non-pharmacological approaches. I explained in particular the risks and ramifications of untreated moderate to severe OSA, especially with respect to developing cardiovascular disease down the Road, including  congestive heart failure, difficult to treat hypertension, cardiac arrhythmias, or stroke. Even type 2 diabetes has, in part, been linked to untreated OSA. Symptoms of untreated OSA include daytime sleepiness, memory problems, mood irritability and mood disorder such as depression and anxiety, lack of energy, as well as recurrent headaches, especially morning headaches. We talked about trying to maintain a healthy lifestyle in general, as well as the importance of weight control. I encouraged the patient to eat healthy, exercise daily and keep well hydrated, to keep a scheduled bedtime and wake time routine, to not skip any meals and eat healthy snacks in between meals. I advised the patient not to drive when feeling sleepy. I recommended the following at this time: sleep study with potential positive airway pressure titration. (We will score hypopneas at 3% and split the sleep study into diagnostic and treatment portion, if the estimated. 2 hour AHI is >15/h).   I explained the sleep test procedure to the patient and also outlined possible surgical and non-surgical treatment options of OSA, including the use of a custom-made dental device (which would require a referral to a specialist dentist or oral surgeon), upper airway surgical options, such as pillar implants, radiofrequency surgery, tongue base surgery, and UPPP (which would involve a referral to an ENT surgeon). Rarely, jaw surgery  such as mandibular advancement may be considered.  I also explained the CPAP treatment option to the patient, who indicated that he would be willing to try CPAP if the need arises. I explained the importance of being compliant with PAP treatment, not only for insurance purposes but primarily to improve His symptoms, and for the patient's long term health benefit, including to reduce His cardiovascular risks. I answered all his questions today and the patient was in agreement. I would like to see him back after the sleep study is completed and encouraged him to call with any interim questions, concerns, problems or updates.   Thank you very much for allowing me to participate in the care of this nice patient. If I can be of any further assistance to you please do not hesitate to call me at [phone].  Sincerely,   Star Age, MD, PhD

## 2014-12-14 NOTE — Patient Instructions (Signed)

## 2014-12-18 ENCOUNTER — Other Ambulatory Visit: Payer: Self-pay | Admitting: Cardiovascular Disease

## 2014-12-18 ENCOUNTER — Other Ambulatory Visit: Payer: Self-pay | Admitting: Internal Medicine

## 2014-12-19 ENCOUNTER — Encounter: Payer: Self-pay | Admitting: Nurse Practitioner

## 2014-12-19 ENCOUNTER — Ambulatory Visit (INDEPENDENT_AMBULATORY_CARE_PROVIDER_SITE_OTHER): Payer: BLUE CROSS/BLUE SHIELD | Admitting: Nurse Practitioner

## 2014-12-19 VITALS — BP 160/90 | HR 68 | Ht 70.0 in | Wt 261.8 lb

## 2014-12-19 DIAGNOSIS — E785 Hyperlipidemia, unspecified: Secondary | ICD-10-CM | POA: Diagnosis not present

## 2014-12-19 DIAGNOSIS — I259 Chronic ischemic heart disease, unspecified: Secondary | ICD-10-CM

## 2014-12-19 DIAGNOSIS — I10 Essential (primary) hypertension: Secondary | ICD-10-CM | POA: Diagnosis not present

## 2014-12-19 MED ORDER — AZITHROMYCIN 250 MG PO TABS: ORAL_TABLET | ORAL | Status: DC

## 2014-12-19 MED ORDER — AMLODIPINE BESYLATE 10 MG PO TABS: 10.0000 mg | ORAL_TABLET | Freq: Every day | ORAL | Status: DC

## 2014-12-19 NOTE — Patient Instructions (Addendum)
We will be checking the following labs today - NONE   Medication Instructions:    Continue with your current medicines. But  I am increasing the Norvasc to 10 mg a day - ok to take 2 of the 5 mg tablets to equal this dose - the RX for the 10 mg is at the drug store.   I did send in a RX for a Z pak    Testing/Procedures To Be Arranged:  N/A  Follow-Up:   See me in 4 weeks   Other Special Instructions:   N/A    If you need a refill on your cardiac medications before your next appointment, please call your pharmacy.   Call the [place] office at [phone] if you have any questions, problems or concerns.

## 2014-12-19 NOTE — Progress Notes (Signed)
CARDIOLOGY OFFICE NOTE  Date:  12/19/2014    Leonard Edwards Date of Birth: Jul 15, 1955 Medical Record [id number]  PCP:  Marton Redwood, MD  Cardiologist:  Burt Knack    Chief Complaint  Patient presents with  . Coronary Artery Disease    Follow up visit - seen for Dr. Burt Knack  . Hypertension    History of Present Illness: Leonard Edwards is a 59 y.o. male who presents today for a follow up visit. Seen for Dr. Burt Knack. He is followed for coronary artery disease and hypertension. He initially presented several years ago with acute coronary syndrome and underwent PCI to right coronary artery. He developed recurrent anginal symptoms and underwent stenting of the ramus intermedius branch in 2011.  Seen by myself in 2015 - had multiple complaints including neck, chest, and shoulder pain. Cardiac enzymes were checked and they were negative. A stress Myoview was performed with result outlined below. I started him on Norvasc for BP control.   Seen by Dr. Burt Knack last June and was improved. Was given the ok to proceed on with shoulder surgery.   I have seen him several times this past spring/summer. BP up. Way off track with taking care of himself. This has been a chronic issue we have had to deal with. Last seen in September - again - hard to stay on track with his health. Admitted to missing morning medicines and having some chest tightness when his medicines were missed.   Comes in today. Here alone. He has a URI, sore throat and drainage going down his throat. Been better with exercise. BP typically above 140/90. He has lost some weight. Now on Cymbalta for nerve/muscle pain. Still gets chest tightness if he misses his medicines - says he has done better about taking but still questionable. Pretty adamant that he does not have chest pain if he takes his medicines as prescribed.  He has lost about 5 pounds.   Past Medical History  Diagnosis Date  . CAD (coronary artery disease)     DES to  distal RCA in 09/2004; DES to Ramus Intermediate 10/2009 following abnormal stress test  . Hyperlipidemia   . Hypertension   . GERD (gastroesophageal reflux disease)   . Hiatal hernia   . Gastric polyp   . Esophageal stricture   . Gout   . Benign prostatic hypertrophy   . Obesity   . Fibromyalgia     Past Surgical History  Procedure Laterality Date  . Des to distal rca in 09/2004; des to ramus intermediate 10/2009 following abnormal stress test  2006, 2011     Medications: Current Outpatient Prescriptions  Medication Sig Dispense Refill  . allopurinol (ZYLOPRIM) 300 MG tablet Take 300 mg by mouth daily.     . ANDROGEL PUMP 20.25 MG/ACT (1.62%) GEL   4  . aspirin 81 MG tablet Take 81 mg by mouth daily.      . Cholecalciferol (VITAMIN D3) 1000 UNITS CAPS Take 1,000 Units by mouth daily.      Marland Kitchen CIALIS 20 MG tablet TAKE ONE TABLET DAILY AS NEEDED FOR     ERECTILE DYSFUNCTION 10 tablet 5  . Coenzyme Q10 (CO Q 10) 100 MG CAPS Take 100 mg by mouth daily.      . DULoxetine (CYMBALTA) 60 MG capsule   5  . fluticasone (FLONASE) 50 MCG/ACT nasal spray TWO SPRAYS DAILY IN NOSE (Patient taking differently: TWO SPRAYS DAILY IN NOSE prn) 16 g 0  .  hydrochlorothiazide (HYDRODIURIL) 25 MG tablet TAKE ONE TABLET EACH DAY 30 tablet 0  . lisinopril (PRINIVIL,ZESTRIL) 40 MG tablet TAKE ONE TABLET EACH DAY 30 tablet 2  . LOVAZA 1 G capsule TAKE ONE CAPSULE TWICE A DAY 60 each 0  . metoprolol succinate (TOPROL-XL) 50 MG 24 hr tablet TAKE ONE TABLET TWICE DAILY 60 tablet 0  . montelukast (SINGULAIR) 10 MG tablet Take 10 mg by mouth at bedtime.    Marland Kitchen omeprazole (PRILOSEC) 40 MG capsule TAKE ONE CAPSULE EVERY MORNING 30 capsule 1  . rosuvastatin (CRESTOR) 10 MG tablet Take 1 tablet (10 mg total) by mouth daily. 90 tablet 3  . amLODipine (NORVASC) 10 MG tablet Take 1 tablet (10 mg total) by mouth daily. 90 tablet 3  . azithromycin (ZITHROMAX Z-PAK) 250 MG tablet 2 pills today and then one pill daily til  complete 6 each 0   No current facility-administered medications for this visit.    Allergies: No Known Allergies  Social History: The patient  reports that he has never smoked. He has never used smokeless tobacco. He reports that he drinks about 3.0 oz of alcohol per week. He reports that he does not use illicit drugs.   Family History: The patient's family history includes COPD in his mother; Diabetes type II in his sister; Heart attack in his father; Lupus in his sister. There is no history of Colon cancer, Pancreatic cancer, or Stomach cancer.   Review of Systems: Please see the history of present illness.   Otherwise, the review of systems is positive for none.   All other systems are reviewed and negative.   Physical Exam: VS:  BP 160/90 mmHg  Pulse 68  Ht 5\' 10"  (1.778 m)  Wt 261 lb 12.8 oz (118.752 kg)  BMI 37.56 kg/m2  SpO2 96% .  BMI Body mass index is 37.56 kg/(m^2).  Wt Readings from Last 3 Encounters:  12/19/14 261 lb 12.8 oz (118.752 kg)  12/14/14 267 lb (121.11 kg)  10/31/14 266 lb (120.657 kg)   Repeat BP by me is 150/100  General: Pleasant. Well developed, well nourished and in no acute distress.  HEENT: Normal. Neck: Supple, no JVD, carotid bruits, or masses noted.  Cardiac: Regular rate and rhythm. No murmurs, rubs, or gallops. No edema.  Respiratory:  Lungs are clear to auscultation bilaterally with normal work of breathing.  GI: Soft and nontender.  MS: No deformity or atrophy. Gait and ROM intact. Skin: Warm and dry. Color is normal.  Neuro:  Strength and sensation are intact and no gross focal deficits noted.  Psych: Alert, appropriate and with normal affect.   LABORATORY DATA:  EKG:  EKG is not ordered today.  Lab Results  Component Value Date   WBC 9.9 07/17/2014   HGB 16.4 07/17/2014   HCT 48.3 07/17/2014   PLT 288.0 07/17/2014   GLUCOSE 104* 07/17/2014   CHOL 133 07/17/2014   TRIG 121.0 07/17/2014   HDL 34.60* 07/17/2014   LDLDIRECT  68.2 10/25/2010   LDLCALC 74 07/17/2014   ALT 30 07/17/2014   AST 26 07/17/2014   NA 138 07/17/2014   K 3.5 07/17/2014   CL 102 07/17/2014   CREATININE 0.74 07/17/2014   BUN 14 07/17/2014   CO2 26 07/17/2014   TSH 1.81 07/17/2014   PSA 1.03 08/29/2009   INR 1.1 ratio* 10/29/2009    BNP (last 3 results) No results for input(s): BNP in the last 8760 hours.  ProBNP (last 3 results) No  results for input(s): PROBNP in the last 8760 hours.   Other Studies Reviewed Today:  Myoview Impression from 06/2013  Exercise Capacity: Good exercise capacity.  BP Response: Normal blood pressure response.  Clinical Symptoms: No chest pain.  ECG Impression: No significant ST segment change suggestive of ischemia.  Comparison with Prior Nuclear Study: No images to compare  Overall Impression: Low risk stress nuclear study. Small anteroseptal scar. No ischemia. Normal LV systolic function.  LV Ejection Fraction: 50%. LV Wall Motion: Normal Wall Motion   ASSESSMENT AND PLAN:  1. CAD, native vessel with prior PCIs. Stable Myoview last year. Some atypical symptoms. He wants to continue to see how he can do with trying to work on his lifestyle/weight. Clearly related to compliance with his medicines. BP needs better control. Would have low threshold to repeat cardiac cath if symptoms change.   2. HTN - not with ideal control - will increase Norvasc to 10 mg - cautioned about swelling.   3. Hyperlipidemia - on lovaza and crestor. Labs are drawn at Dr. Raul Del  4. Obesity - long standing problem.   5. URI - I gave him a Zpak today.  Current medicines are reviewed with the patient today.  The patient does not have concerns regarding medicines other than what has been noted above.  The following changes have been made:  See above.  Labs/ tests ordered today include:   No orders of the defined types were placed in this encounter.     Disposition:   FU with me in 4 weeks.   Patient is  agreeable to this plan and will call if any problems develop in the interim.   Signed: Burtis Junes, RN, ANP-C 12/19/2014 10:15 AM  [facility] [address] [place], [place]  [address] Phone: [phone] Fax: [phone]

## 2015-01-15 ENCOUNTER — Ambulatory Visit: Payer: BLUE CROSS/BLUE SHIELD | Admitting: Nurse Practitioner

## 2015-02-07 ENCOUNTER — Ambulatory Visit: Payer: BLUE CROSS/BLUE SHIELD | Admitting: Nurse Practitioner

## 2015-02-15 ENCOUNTER — Encounter: Payer: Self-pay | Admitting: Nurse Practitioner

## 2015-03-15 ENCOUNTER — Telehealth: Payer: Self-pay | Admitting: Nurse Practitioner

## 2015-03-15 NOTE — Telephone Encounter (Signed)
S/w pt stopped cymbalta recently stated did wean off of medication.  Very dizzy and ankles swelling. Denies SOB, fever, chills, does complain of throat pain and some chest tightness.  BP readings have been ok but stated never that low.  Pt was told to elevate legs, and watch salt intake.  Compression hose recommended.  Pt is traveling to [place] on Saturday for one day to visit family.  Pt is aware of chest pressure gets worse and develops chest pain to go to ER.  Pt has appointment on Monday to see [name], NP.  [name] is aware.

## 2015-03-15 NOTE — Telephone Encounter (Signed)
New message    Pt c/o swelling: STAT is pt has developed SOB within 24 hours  1. How long have you been experiencing swelling? WEEK 2. Where is the swelling located? ANKLES 3.  Are you currently taking a "fluid pill"? UNKNOWN 4.  Are you currently SOB? NO  DIZZY     BP 111/60   118/65   130/70   110/59  5.  Have you traveled recently? NO

## 2015-03-19 ENCOUNTER — Encounter: Payer: Self-pay | Admitting: Nurse Practitioner

## 2015-03-19 ENCOUNTER — Ambulatory Visit (INDEPENDENT_AMBULATORY_CARE_PROVIDER_SITE_OTHER): Payer: BLUE CROSS/BLUE SHIELD | Admitting: Nurse Practitioner

## 2015-03-19 VITALS — BP 150/88 | Ht 70.0 in | Wt 266.2 lb

## 2015-03-19 DIAGNOSIS — I10 Essential (primary) hypertension: Secondary | ICD-10-CM

## 2015-03-19 DIAGNOSIS — R42 Dizziness and giddiness: Secondary | ICD-10-CM

## 2015-03-19 DIAGNOSIS — I259 Chronic ischemic heart disease, unspecified: Secondary | ICD-10-CM | POA: Diagnosis not present

## 2015-03-19 NOTE — Patient Instructions (Addendum)
We will be checking the following labs today - NONE   Medication Instructions:    Continue with your current medicines.     Testing/Procedures To Be Arranged:  Stress Myoview  Follow-Up:   See me in 3 months   Other Special Instructions:   N/A    If you need a refill on your cardiac medications before your next appointment, please call your pharmacy.   Call the [place] office at [phone] if you have any questions, problems or concerns.

## 2015-03-19 NOTE — Progress Notes (Signed)
CARDIOLOGY OFFICE NOTE  Date:  03/19/2015    [name] Date of Birth: 02/17/1955 Medical Record [id number]  PCP:  Marton Redwood, MD  Cardiologist:  Burt Knack    Chief Complaint  Patient presents with  . Dizziness    History of Present Illness: Ronzell JAKIL IRIZARRY is a 60 y.o. male who presents today for a work in visit today. Seen for Dr. Burt Knack. He is followed for coronary artery disease and hypertension. He initially presented several years ago with acute coronary syndrome and underwent PCI to right coronary artery. He developed recurrent anginal symptoms and underwent stenting of the ramus intermedius branch in 2011.  Seen by myself in 2015 - had multiple complaints including neck, chest, and shoulder pain. Cardiac enzymes were checked and they were negative. A stress Myoview was performed with result outlined below. I started him on Norvasc for BP control.   I have seen him several times over the past year. BP up. Way off track with taking care of himself. This has been a chronic issue we have had to deal with. Admits to missing morning medicines and having some chest tightness when his medicines were missed. Last visit with me was back in November - had had a URI but on track with diet/losing weight, etc.   Called late last week - BP ok but he was not feeling well. Dizzy. URI symptoms. Thus added to my schedule for today.   Comes in today. Here alone. Had been "pretty bad" about a week prior. Had more lower extremity edema. Dizzy. BP lower than what he usually had - like 106/50. Sore throat. Fever of 102.  Had more chest tightness over the past week or so with exercise - he was taking his medicines - typically has this if he DOES NOT take his medicines. Stopped Cymbalta due to weight gain despite going to the gym. 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 Now feeling better. Went to [facility] on Friday - placed on antibiotics. Still hoarse but  improving. Wants to go on a "major work out" and wanting to see if this ok with [place].  He notes that he has chronically had diffuse aching in the setting of low grade infection/fever which is treated with antibiotics. Lots of allergies. Thinking about going to the [facility] for complete evaluation. Says he has been worked up for autoimmune disorder in the past.   Past Medical History  Diagnosis Date  . CAD (coronary artery disease)     DES to distal RCA in 09/2004; DES to Ramus Intermediate 10/2009 following abnormal stress test  . Hyperlipidemia   . Hypertension   . GERD (gastroesophageal reflux disease)   . Hiatal hernia   . Gastric polyp   . Esophageal stricture   . Gout   . Benign prostatic hypertrophy   . Obesity   . Fibromyalgia     Past Surgical History  Procedure Laterality Date  . Des to distal rca in 09/2004; des to ramus intermediate 10/2009 following abnormal stress test  2006, 2011     Medications: Current Outpatient Prescriptions  Medication Sig Dispense Refill  . allopurinol (ZYLOPRIM) 300 MG tablet Take 300 mg by mouth daily.     Marland Kitchen amLODipine (NORVASC) 10 MG tablet Take 1 tablet (10 mg total) by mouth daily. 90 tablet 3  . amoxicillin-clavulanate (AUGMENTIN) 875-125 MG tablet Take 1 tablet by mouth 2 (two) times daily.  0  . ANDROGEL PUMP 20.25 MG/ACT (1.62%) GEL   4  . aspirin 81 MG tablet Take 81 mg by mouth daily.      . Cholecalciferol (VITAMIN D3) 1000 UNITS CAPS Take 1,000 Units by mouth daily.      Marland Kitchen CIALIS 20 MG tablet TAKE ONE TABLET DAILY AS NEEDED FOR     ERECTILE DYSFUNCTION 10 tablet 5  . Coenzyme Q10 (CO Q 10) 100 MG CAPS Take 100 mg by mouth daily.      . fluticasone (FLONASE) 50 MCG/ACT nasal spray TWO SPRAYS DAILY IN NOSE (Patient taking differently: TWO SPRAYS DAILY IN NOSE prn) 16 g 0  . hydrochlorothiazide (HYDRODIURIL) 25 MG tablet TAKE ONE TABLET EACH DAY 30 tablet 0  . lisinopril (PRINIVIL,ZESTRIL) 40 MG tablet TAKE ONE TABLET EACH DAY 30  tablet 2  . LOVAZA 1 G capsule TAKE ONE CAPSULE TWICE A DAY 60 each 0  . metoprolol succinate (TOPROL-XL) 50 MG 24 hr tablet Take 1 tablet (50 mg total) by mouth 2 (two) times daily. 60 tablet 11  . montelukast (SINGULAIR) 10 MG tablet Take 10 mg by mouth at bedtime.    Marland Kitchen omeprazole (PRILOSEC) 40 MG capsule TAKE ONE CAPSULE EVERY MORNING 30 capsule 1  . rosuvastatin (CRESTOR) 10 MG tablet Take 1 tablet (10 mg total) by mouth daily. 90 tablet 3   No current facility-administered medications for this visit.    Allergies: No Known Allergies  Social History: The patient  reports that he has never smoked. He has never used smokeless tobacco. He reports that he drinks about 3.0 oz of alcohol per week. He reports that he does not use illicit drugs.   Family History: The patient's family history includes COPD in his mother; Diabetes type II in his sister; Heart attack in his father; Lupus in his sister. There is no history of Colon cancer, Pancreatic cancer, or Stomach cancer.   Review of Systems: Please see the history of present illness.   Otherwise, the review of systems is positive for .   All other systems are reviewed and negative.   Physical Exam: VS:  BP 150/88 mmHg  Ht 5\' 10"  (1.778 m)  Wt 266 lb 3.2 oz (120.748 kg)  BMI 38.20 kg/m2 .  BMI Body mass index is 38.2 kg/(m^2).  Wt Readings from Last 3 Encounters:  03/19/15 266 lb 3.2 oz (120.748 kg)  12/19/14 261 lb 12.8 oz (118.752 kg)  12/14/14 267 lb (121.11 kg)    General: Pleasant. He remains obese. Weight is up. Sounds hoarse but in no acute distress.  HEENT: Normal but with some lymphadenopathy noted. Neck: Supple, no JVD, carotid bruits, or masses noted.  Cardiac: Regular rate and rhythm. No murmurs, rubs, or gallops. No edema.  Respiratory:  Lungs are clear to auscultation bilaterally with normal work of breathing.  GI: Soft and nontender.  MS: No deformity or atrophy. Gait and ROM intact. Skin: Warm and dry. Color is  normal.  Neuro:  Strength and sensation are intact and no gross focal deficits noted.  Psych: Alert, appropriate and with normal affect.   LABORATORY DATA:  EKG:  EKG is not ordered today.   Lab Results  Component Value Date   WBC 9.9 07/17/2014   HGB 16.4 07/17/2014   HCT 48.3 07/17/2014   PLT 288.0 07/17/2014   GLUCOSE 104* 07/17/2014   CHOL 133 07/17/2014   TRIG 121.0 07/17/2014   HDL 34.60* 07/17/2014   LDLDIRECT 68.2 10/25/2010  LDLCALC 74 07/17/2014   ALT 30 07/17/2014   AST 26 07/17/2014   NA 138 07/17/2014   K 3.5 07/17/2014   CL 102 07/17/2014   CREATININE 0.74 07/17/2014   BUN 14 07/17/2014   CO2 26 07/17/2014   TSH 1.81 07/17/2014   PSA 1.03 08/29/2009   INR 1.1 ratio* 10/29/2009    BNP (last 3 results) No results for input(s): BNP in the last 8760 hours.  ProBNP (last 3 results) No results for input(s): PROBNP in the last 8760 hours.   Other Studies Reviewed Today:  Myoview Impression from 06/2013  Exercise Capacity: Good exercise capacity.  BP Response: Normal blood pressure response.  Clinical Symptoms: No chest pain.  ECG Impression: No significant ST segment change suggestive of ischemia.  Comparison with Prior Nuclear Study: No images to compare  Overall Impression: Low risk stress nuclear study. Small anteroseptal scar. No ischemia. Normal LV systolic function.  LV Ejection Fraction: 50%. LV Wall Motion: Normal Wall Motion   ASSESSMENT AND PLAN:  1. CAD, native vessel with prior PCIs. Stable Myoview last year. Has had recurrent URI - had chest tightness with exertion last week - despite being on his medicines. Will get his Myoview updated prior to increasing his work out schedule. However, if his chest tightness continues this week as he goes back to the gym - would proceed on with cardiac cath  2. HTN - BP fair. He is on high dose Norvasc which may aggravate his swelling - none on exam today.   3. Hyperlipidemia - on lovaza and  crestor. Labs are drawn at Dr. Raul Del  4. Obesity - long standing problem.   5. Recurrent URI - he is on antibiotics  6. Swelling - he is on high dose Norvasc - no swelling on exam today - may have been related to Cymbalta or high sodium diet.   7. ?Auto immune disorder - says he has been worked up for this in the past. He is considering a visit to the [facility] in the future.   Current medicines are reviewed with the patient today.  The patient does not have concerns regarding medicines other than what has been noted above.  The following changes have been made:  See above.  Labs/ tests ordered today include:    Orders Placed This Encounter  Procedures  . Myocardial Perfusion Imaging     Disposition:   FU with me in 3 months, unless Myoview abnormal, or persistent symptoms.   Patient is agreeable to this plan and will call if any problems develop in the interim.   Signed: Burtis Junes, RN, ANP-C 03/19/2015 9:52 AM  [facility] [address] [place], [place]  [address] Phone: [phone] Fax: [phone]

## 2015-03-22 ENCOUNTER — Telehealth (HOSPITAL_COMMUNITY): Payer: Self-pay | Admitting: *Deleted

## 2015-03-22 NOTE — Telephone Encounter (Signed)
Patient given detailed instructions per Myocardial Perfusion Study Information Sheet for the test on 03/27/15 at 0730. Patient notified to arrive 15 minutes early and that it is imperative to arrive on time for appointment to keep from having the test rescheduled.  If you need to cancel or reschedule your appointment, please call the office within 24 hours of your appointment. Failure to do so may result in a cancellation of your appointment, and a $50 no show fee. Patient verbalized understanding.Lakaisha Danish, Ranae Palms

## 2015-03-27 ENCOUNTER — Ambulatory Visit (HOSPITAL_COMMUNITY): Payer: BLUE CROSS/BLUE SHIELD | Attending: Cardiology

## 2015-03-27 DIAGNOSIS — I10 Essential (primary) hypertension: Secondary | ICD-10-CM | POA: Diagnosis not present

## 2015-03-27 DIAGNOSIS — I259 Chronic ischemic heart disease, unspecified: Secondary | ICD-10-CM | POA: Insufficient documentation

## 2015-03-27 DIAGNOSIS — R079 Chest pain, unspecified: Secondary | ICD-10-CM | POA: Insufficient documentation

## 2015-03-27 DIAGNOSIS — R9439 Abnormal result of other cardiovascular function study: Secondary | ICD-10-CM | POA: Insufficient documentation

## 2015-03-27 DIAGNOSIS — R42 Dizziness and giddiness: Secondary | ICD-10-CM | POA: Insufficient documentation

## 2015-03-27 LAB — MYOCARDIAL PERFUSION IMAGING
Estimated workload: 10.6 METS
Exercise duration (min): 10 min
Exercise duration (sec): 0 s
LV dias vol: 152 mL
LV sys vol: 86 mL
MPHR: 161 {beats}/min
Peak HR: 141 {beats}/min
Percent HR: 88 %
RATE: 0.43
Rest HR: 61 {beats}/min
SDS: 4
SRS: 7
SSS: 11
TID: 0.27

## 2015-03-27 MED ORDER — TECHNETIUM TC 99M SESTAMIBI GENERIC - CARDIOLITE
32.7000 | Freq: Once | INTRAVENOUS | Status: AC | PRN
  Administered 2015-03-27: 32.7 via INTRAVENOUS

## 2015-03-27 MED ORDER — TECHNETIUM TC 99M SESTAMIBI GENERIC - CARDIOLITE
10.0000 | Freq: Once | INTRAVENOUS | Status: AC | PRN
  Administered 2015-03-27: 10 via INTRAVENOUS

## 2015-03-28 ENCOUNTER — Ambulatory Visit (INDEPENDENT_AMBULATORY_CARE_PROVIDER_SITE_OTHER): Payer: BLUE CROSS/BLUE SHIELD | Admitting: Nurse Practitioner

## 2015-03-28 ENCOUNTER — Encounter: Payer: Self-pay | Admitting: Nurse Practitioner

## 2015-03-28 ENCOUNTER — Other Ambulatory Visit: Payer: Self-pay | Admitting: Nurse Practitioner

## 2015-03-28 VITALS — BP 140/82 | HR 54 | Ht 70.0 in | Wt 269.0 lb

## 2015-03-28 DIAGNOSIS — R0789 Other chest pain: Secondary | ICD-10-CM

## 2015-03-28 DIAGNOSIS — R9439 Abnormal result of other cardiovascular function study: Secondary | ICD-10-CM | POA: Diagnosis not present

## 2015-03-28 LAB — BASIC METABOLIC PANEL
Anion gap: 11 (ref 5–15)
BUN: 14 mg/dL (ref 6–20)
CO2: 31 mmol/L (ref 22–32)
Calcium: 9.5 mg/dL (ref 8.9–10.3)
Chloride: 100 mmol/L — ABNORMAL LOW (ref 101–111)
Creatinine, Ser: 0.91 mg/dL (ref 0.61–1.24)
GFR calc Af Amer: >60 mL/min (ref >60)
GFR calc non Af Amer: >60 mL/min (ref >60)
Glucose, Bld: 102 mg/dL — ABNORMAL HIGH (ref 65–99)
Potassium: 3.9 mmol/L (ref 3.5–5.1)
Sodium: 142 mmol/L (ref 135–145)

## 2015-03-28 LAB — CBC
HCT: 43.8 % (ref 39.0–52.0)
Hemoglobin: 15.4 g/dL (ref 13.0–17.0)
MCH: 31.7 pg (ref 26.0–34.0)
MCHC: 35.2 g/dL (ref 30.0–36.0)
MCV: 90.1 fL (ref 78.0–100.0)
Platelets: 352 10*3/uL (ref 150–400)
RBC: 4.86 MIL/uL (ref 4.22–5.81)
RDW: 12.5 % (ref 11.5–15.5)
WBC: 11.4 10*3/uL — ABNORMAL HIGH (ref 4.0–10.5)

## 2015-03-28 LAB — APTT: aPTT: 27 seconds (ref 24–37)

## 2015-03-28 LAB — PROTIME-INR
INR: 1.03 (ref 0.00–1.49)
Prothrombin Time: 13.7 seconds (ref 11.6–15.2)

## 2015-03-28 NOTE — Progress Notes (Signed)
CARDIOLOGY OFFICE NOTE  Date:  03/28/2015    Leonard Edwards Date of Birth: September 14, 1955 Medical Record [id number]  PCP:  Marton Redwood, MD  Cardiologist:  Burt Knack    Chief Complaint  Patient presents with  . Coronary Artery Disease  . Chest Pain  . Hypertension  . Hyperlipidemia    Pre cath visit - seen for Dr. Burt Knack    History of Present Illness: Leonard Edwards is a 60 y.o. male who presents today for a pre cath visit. Seen for Dr. Burt Knack. He is followed for coronary artery disease and hypertension. He initially presented several years ago with acute coronary syndrome and underwent PCI to right coronary artery in 2006. He developed recurrent anginal symptoms and underwent stenting of the ramus intermedius branch in 2011.  I have seen him several times over the past year or so. He is bad to get off track with taking care of himself. Has typically had some chest tightness with exertion in the setting of missing his medicines.   Seen earlier this month after a URI. Was having spells of chest tightness in the setting of having his medicines - this was new for him. He was really wanting to "work out hard". We sent him for a Myoview - see below.   Comes in today. Here alone. He is doing ok. No real change in his symptoms since his last visit with me. He is worried. BP just fair. No fever or chills. No symptoms at rest. I have restricted his exercise.   Past Medical History  Diagnosis Date  . CAD (coronary artery disease)     DES to distal RCA in 09/2004; DES to Ramus Intermediate 10/2009 following abnormal stress test  . Hyperlipidemia   . Hypertension   . GERD (gastroesophageal reflux disease)   . Hiatal hernia   . Gastric polyp   . Esophageal stricture   . Gout   . Benign prostatic hypertrophy   . Obesity   . Fibromyalgia     Past Surgical History  Procedure Laterality Date  . Des to distal rca in 09/2004; des to ramus intermediate 10/2009 following abnormal stress test   2006, 2011     Medications: Current Outpatient Prescriptions  Medication Sig Dispense Refill  . allopurinol (ZYLOPRIM) 300 MG tablet Take 300 mg by mouth daily as needed (for gout flare ups).     Marland Kitchen amLODipine (NORVASC) 10 MG tablet Take 1 tablet (10 mg total) by mouth daily. 90 tablet 3  . amoxicillin-clavulanate (AUGMENTIN) 875-125 MG tablet Take 1 tablet by mouth 2 (two) times daily. Reported on 03/28/2015  0  . ANDROGEL PUMP 20.25 MG/ACT ([id number]) GEL 1 application.   4  . aspirin 81 MG tablet Take 81 mg by mouth daily.      . Cholecalciferol (VITAMIN D3) 1000 UNITS CAPS Take 1,000 Units by mouth daily.      Marland Kitchen CIALIS 20 MG tablet TAKE ONE TABLET DAILY AS NEEDED FOR     ERECTILE DYSFUNCTION 10 tablet 5  . Coenzyme Q10 (CO Q 10) 100 MG CAPS Take 100 mg by mouth daily.      . fluticasone (FLONASE) 50 MCG/ACT nasal spray TWO SPRAYS DAILY IN NOSE (Patient taking differently: TWO SPRAYS DAILY IN NOSE prn) 16 g 0  . hydrochlorothiazide (HYDRODIURIL) 25 MG tablet TAKE ONE TABLET EACH DAY 30 tablet 0  . lisinopril (PRINIVIL,ZESTRIL) 40 MG tablet TAKE ONE TABLET EACH DAY 30 tablet 2  . LOVAZA 1  G capsule TAKE ONE CAPSULE TWICE A DAY (Patient taking differently: TAKE ONE CAPSULE once A DAY) 60 each 0  . metoprolol succinate (TOPROL-XL) 50 MG 24 hr tablet Take 1 tablet (50 mg total) by mouth 2 (two) times daily. 60 tablet 11  . montelukast (SINGULAIR) 10 MG tablet Take 10 mg by mouth at bedtime.    Marland Kitchen omeprazole (PRILOSEC) 40 MG capsule TAKE ONE CAPSULE EVERY MORNING (Patient taking differently: TAKE ONE CAPSULE daily prn for acid reflux) 30 capsule 1  . rosuvastatin (CRESTOR) 10 MG tablet Take 1 tablet (10 mg total) by mouth daily. 90 tablet 3   No current facility-administered medications for this visit.    Allergies: No Known Allergies  Social History: The patient  reports that he has never smoked. He has never used smokeless tobacco. He reports that he drinks about 3.0 oz of alcohol per week.  He reports that he does not use illicit drugs.   Family History: The patient's family history includes COPD in his mother; Diabetes type II in his sister; Heart attack in his father; Lupus in his sister. There is no history of Colon cancer, Pancreatic cancer, or Stomach cancer.   Review of Systems: Please see the history of present illness.   Otherwise, the review of systems is positive for none.   All other systems are reviewed and negative.   Physical Exam: VS:  BP 140/82 mmHg  Pulse 54  Ht 5\' 10"  (1.778 m)  Wt 269 lb (122.018 kg)  BMI 38.60 kg/m2  SpO2 98% .  BMI Body mass index is 38.6 kg/(m^2).  Wt Readings from Last 3 Encounters:  03/28/15 269 lb (122.018 kg)  03/27/15 266 lb (120.657 kg)  03/19/15 266 lb 3.2 oz (120.748 kg)    General: Pleasant. Well developed, well nourished and in no acute distress.  HEENT: Normal. Neck: Supple, no JVD, carotid bruits, or masses noted.  Cardiac: Regular rate and rhythm. No murmurs, rubs, or gallops. No edema.  Respiratory:  Lungs are clear to auscultation bilaterally with normal work of breathing.  GI: Soft and nontender.  MS: No deformity or atrophy. Gait and ROM intact. Skin: Warm and dry. Color is normal.  Neuro:  Strength and sensation are intact and no gross focal deficits noted.  Psych: Alert, appropriate and with normal affect.   LABORATORY DATA:  EKG:  EKG is not ordered today.  Lab Results  Component Value Date   WBC 9.9 07/17/2014   HGB 16.4 07/17/2014   HCT 48.3 07/17/2014   PLT 288.0 07/17/2014   GLUCOSE 104* 07/17/2014   CHOL 133 07/17/2014   TRIG 121.0 07/17/2014   HDL 34.60* 07/17/2014   LDLDIRECT 68.2 10/25/2010   LDLCALC 74 07/17/2014   ALT 30 07/17/2014   AST 26 07/17/2014   NA 138 07/17/2014   K 3.5 07/17/2014   CL 102 07/17/2014   CREATININE 0.74 07/17/2014   BUN 14 07/17/2014   CO2 26 07/17/2014   TSH 1.81 07/17/2014   PSA 1.03 08/29/2009   INR 1.1 ratio* 10/29/2009    BNP (last 3  results) No results for input(s): BNP in the last 8760 hours.  ProBNP (last 3 results) No results for input(s): PROBNP in the last 8760 hours.   Other Studies Reviewed Today: Myoview Study Highlights from 03/27/2015    The left ventricular ejection fraction is moderately decreased (30-44%).  Nuclear stress EF: 43%.  This is a high risk study.    Addendum by Leonard Margarita, MD  on Tue Mar 27, 2015 2:14 PM    The left ventricular ejection fraction is moderately decreased (30-44%).  Nuclear stress EF: 43%.  This is a high risk study.    Addendum by Leonard Margarita, MD on Tue Mar 27, 2015 2:15 PM    The left ventricular ejection fraction is moderately decreased (30-44%).  Nuclear stress EF: 43%.  This is a high risk study.  There is a small defect of mild severity present in the basal anterolateral location. The defect is non-reversible.  There is a large defect of moderate severity present in the basal anteroseptal, basal inferoseptal, mid anteroseptal, mid inferoseptal, apical septal and apex location. The defect is partially reversible and is consistent with infarct with peri- infarct ischemia. There is significant extracardiac activity and diaphragmatic attenuation as well. In the setting of LV dysfunction favor ischemia.      Assessment/Plan: 1. CAD, native vessel with prior PCIs. Now with high risk Myoview in the setting of chest tightness. Have recommended cardiac catheterization. The patient understands that risks include but are not limited to stroke (1 in 1000), death (1 in 48), kidney failure [usually temporary] (1 in 500), bleeding (1 in 200), allergic reaction [possibly serious] (1 in 200), and agrees to proceed. Scheduled for tomorrow with Dr. Burt Knack. Labs are ordered stat in preparation for tomorrow's study.   2. HTN - BP fair on current regimen.   3. Hyperlipidemia - on lovaza and crestor. Labs are drawn at Dr. Raul Del  4. Obesity - long standing  problem.   5. Recurrent URI - he is on antibiotics  Current medicines are reviewed with the patient today.  The patient does not have concerns regarding medicines other than what has been noted above.  The following changes have been made:  See above.  Labs/ tests ordered today include:    Orders Placed This Encounter  Procedures  . Basic metabolic panel  . CBC  . APTT  . Protime-INR     Disposition:   Further disposition to follow.   Patient is agreeable to this plan and will call if any problems develop in the interim.   Signed: Burtis Junes, RN, ANP-C 03/28/2015 4:03 PM  [facility] [address] [place], [place]  [address] Phone: [phone] Fax: [phone]

## 2015-03-28 NOTE — Patient Instructions (Addendum)
We will be checking the following labs today - STAT BMET, CBC, PT, PTT   Medication Instructions:    Continue with your current medicines.     Testing/Procedures To Be Arranged:  Cardiac catheterization tomorrow    Other Special Instructions:  Your provider has recommended a cardiac catherization  You are scheduled for a cardiac catheterization on tomorrow, February 23rd at 1:30 pm with Dr. Burt Knack or associate.  Go to [facility] 2nd Floor Short Stay on Thursday, February 23rd at 11:45AM  Enter thru the [employer] entrance A No food or drink after midnight tonight. You may take your medications with a sip of water on the day of your procedure.   Coronary Angiogram A coronary angiogram, also called coronary angiography, is an X-ray procedure used to look at the arteries in the heart. In this procedure, a dye (contrast dye) is injected through a long, hollow tube (catheter). The catheter is about the size of a piece of cooked spaghetti and is inserted through your groin, wrist, or arm. The dye is injected into each artery, and X-rays are then taken to show if there is a blockage in the arteries of your heart.  LET [facility] CARE PROVIDER KNOW ABOUT: Any allergies you have, including allergies to shellfish or contrast dye.  All medicines you are taking, including vitamins, herbs, eye drops, creams, and over-the-counter medicines.  Previous problems you or members of your family have had with the use of anesthetics.  Any blood disorders you have.  Previous surgeries you have had. History of kidney problems or failure.  Other medical conditions you have.  RISKS AND COMPLICATIONS  Generally, a coronary angiogram is a safe procedure. However, about 1 person out of 1000 can have problems that may include: Allergic reaction to the dye. Bleeding/bruising from the access site or other locations. Kidney injury, especially in people with impaired kidney function. Stroke  (rare). Heart attack (rare). Irregular rhythms (rare) Death (rare)  BEFORE THE PROCEDURE  Do not eat or drink anything after midnight the night before the procedure or as directed by your health care provider.  Ask your health care provider about changing or stopping your regular medicines. This is especially important if you are taking diabetes medicines or blood thinners.  PROCEDURE You may be given a medicine to help you relax (sedative) before the procedure. This medicine is given through an intravenous (IV) access tube that is inserted into one of your veins.  The area where the catheter will be inserted will be washed and shaved. This is usually done in the groin but may be done in the fold of your arm (near your elbow) or in the wrist.  A medicine will be given to numb the area where the catheter will be inserted (local anesthetic).  The health care provider will insert the catheter into an artery. The catheter will be guided by using a special type of X-ray (fluoroscopy) of the blood vessel being examined.  A special dye will then be injected into the catheter, and X-rays will be taken. The dye will help to show where any narrowing or blockages are located in the heart arteries.    AFTER THE PROCEDURE  If the procedure is done through the leg, you will be kept in bed lying flat for several hours. You will be instructed to not bend or cross your legs. The insertion site will be checked frequently.  The pulse in your feet or wrist will be checked frequently.  Additional  blood tests, X-rays, and an electrocardiogram may be done.       If you need a refill on your cardiac medications before your next appointment, please call your pharmacy.   Call the [place] office at [phone] if you have any questions, problems or concerns.

## 2015-03-29 ENCOUNTER — Encounter (HOSPITAL_COMMUNITY)
Admission: RE | Disposition: A | Discharge status: Home / Self Care | Payer: Self-pay | Source: Ambulatory Visit | Attending: Cardiovascular Disease

## 2015-03-29 ENCOUNTER — Other Ambulatory Visit: Payer: Self-pay

## 2015-03-29 ENCOUNTER — Ambulatory Visit (HOSPITAL_COMMUNITY)
Admission: RE | Admit: 2015-03-29 | Discharge: 2015-03-29 | Disposition: A | Discharge status: Home / Self Care | Payer: BLUE CROSS/BLUE SHIELD | Source: Ambulatory Visit | Attending: Cardiovascular Disease | Admitting: Cardiovascular Disease

## 2015-03-29 DIAGNOSIS — E669 Obesity, unspecified: Secondary | ICD-10-CM | POA: Diagnosis not present

## 2015-03-29 DIAGNOSIS — Z6838 Body mass index (BMI) 38.0-38.9, adult: Secondary | ICD-10-CM | POA: Diagnosis not present

## 2015-03-29 DIAGNOSIS — I10 Essential (primary) hypertension: Secondary | ICD-10-CM | POA: Insufficient documentation

## 2015-03-29 DIAGNOSIS — K219 Gastro-esophageal reflux disease without esophagitis: Secondary | ICD-10-CM | POA: Diagnosis not present

## 2015-03-29 DIAGNOSIS — E785 Hyperlipidemia, unspecified: Secondary | ICD-10-CM | POA: Diagnosis not present

## 2015-03-29 DIAGNOSIS — Z7982 Long term (current) use of aspirin: Secondary | ICD-10-CM | POA: Insufficient documentation

## 2015-03-29 DIAGNOSIS — M797 Fibromyalgia: Secondary | ICD-10-CM | POA: Diagnosis not present

## 2015-03-29 DIAGNOSIS — J069 Acute upper respiratory infection, unspecified: Secondary | ICD-10-CM | POA: Diagnosis not present

## 2015-03-29 DIAGNOSIS — I2510 Atherosclerotic heart disease of native coronary artery without angina pectoris: Secondary | ICD-10-CM | POA: Insufficient documentation

## 2015-03-29 DIAGNOSIS — Z8249 Family history of ischemic heart disease and other diseases of the circulatory system: Secondary | ICD-10-CM | POA: Insufficient documentation

## 2015-03-29 DIAGNOSIS — M109 Gout, unspecified: Secondary | ICD-10-CM | POA: Diagnosis not present

## 2015-03-29 DIAGNOSIS — R9439 Abnormal result of other cardiovascular function study: Secondary | ICD-10-CM | POA: Diagnosis present

## 2015-03-29 DIAGNOSIS — N400 Enlarged prostate without lower urinary tract symptoms: Secondary | ICD-10-CM | POA: Insufficient documentation

## 2015-03-29 DIAGNOSIS — Z955 Presence of coronary angioplasty implant and graft: Secondary | ICD-10-CM | POA: Diagnosis not present

## 2015-03-29 DIAGNOSIS — R931 Abnormal findings on diagnostic imaging of heart and coronary circulation: Secondary | ICD-10-CM

## 2015-03-29 HISTORY — PX: CARDIAC CATHETERIZATION: SHX172

## 2015-03-29 HISTORY — PX: ULTRASOUND GUIDANCE FOR VASCULAR ACCESS: SHX6516

## 2015-03-29 SURGERY — LEFT HEART CATH AND CORONARY ANGIOGRAPHY
Anesthesia: LOCAL

## 2015-03-29 MED ORDER — SODIUM CHLORIDE 0.9 % IV SOLN: 250.0000 mL | INTRAVENOUS | Status: DC | PRN

## 2015-03-29 MED ORDER — LIDOCAINE HCL (PF) 1 % IJ SOLN
INTRAMUSCULAR | Status: DC | PRN
  Administered 2015-03-29 (×2): 5 mL via SUBCUTANEOUS

## 2015-03-29 MED ORDER — OXYCODONE-ACETAMINOPHEN 5-325 MG PO TABS: 1.0000 | ORAL_TABLET | ORAL | Status: DC | PRN

## 2015-03-29 MED ORDER — HEPARIN (PORCINE) IN NACL 2-0.9 UNIT/ML-% IJ SOLN
INTRAMUSCULAR | Status: AC
  Filled 2015-03-29: qty 500

## 2015-03-29 MED ORDER — FENTANYL CITRATE (PF) 100 MCG/2ML IJ SOLN
INTRAMUSCULAR | Status: DC | PRN
  Administered 2015-03-29: 50 ug via INTRAVENOUS

## 2015-03-29 MED ORDER — HEPARIN SODIUM (PORCINE) 1000 UNIT/ML IJ SOLN
INTRAMUSCULAR | Status: AC
  Filled 2015-03-29: qty 1

## 2015-03-29 MED ORDER — VERAPAMIL HCL 2.5 MG/ML IV SOLN
INTRAVENOUS | Status: AC
  Filled 2015-03-29: qty 2

## 2015-03-29 MED ORDER — DIAZEPAM 5 MG PO TABS
ORAL_TABLET | ORAL | Status: AC
  Filled 2015-03-29: qty 2

## 2015-03-29 MED ORDER — SODIUM CHLORIDE 0.9 % IV SOLN: INTRAVENOUS | Status: DC

## 2015-03-29 MED ORDER — SODIUM CHLORIDE 0.9% FLUSH: 3.0000 mL | Freq: Two times a day (BID) | INTRAVENOUS | Status: DC

## 2015-03-29 MED ORDER — SODIUM CHLORIDE 0.9% FLUSH: 3.0000 mL | INTRAVENOUS | Status: DC | PRN

## 2015-03-29 MED ORDER — SODIUM CHLORIDE 0.9 % IV SOLN
INTRAVENOUS | Status: DC
  Administered 2015-03-29: 13:00:00 via INTRAVENOUS

## 2015-03-29 MED ORDER — NITROGLYCERIN 1 MG/10 ML FOR IR/CATH LAB
INTRA_ARTERIAL | Status: AC
  Filled 2015-03-29: qty 10

## 2015-03-29 MED ORDER — IOHEXOL 350 MG/ML SOLN
INTRAVENOUS | Status: DC | PRN
  Administered 2015-03-29: 85 mL via INTRAVENOUS

## 2015-03-29 MED ORDER — HEPARIN SODIUM (PORCINE) 1000 UNIT/ML IJ SOLN
INTRAMUSCULAR | Status: DC | PRN
  Administered 2015-03-29: 6000 [IU] via INTRAVENOUS

## 2015-03-29 MED ORDER — ASPIRIN 81 MG PO CHEW: 81.0000 mg | CHEWABLE_TABLET | ORAL | Status: DC

## 2015-03-29 MED ORDER — FENTANYL CITRATE (PF) 100 MCG/2ML IJ SOLN
INTRAMUSCULAR | Status: AC
  Filled 2015-03-29: qty 2

## 2015-03-29 MED ORDER — MIDAZOLAM HCL 2 MG/2ML IJ SOLN
INTRAMUSCULAR | Status: DC | PRN
  Administered 2015-03-29: 2 mg via INTRAVENOUS

## 2015-03-29 MED ORDER — LIDOCAINE HCL (PF) 1 % IJ SOLN
INTRAMUSCULAR | Status: AC
  Filled 2015-03-29: qty 30

## 2015-03-29 MED ORDER — DIAZEPAM 5 MG PO TABS
10.0000 mg | ORAL_TABLET | ORAL | Status: AC
  Administered 2015-03-29: 10 mg via ORAL

## 2015-03-29 MED ORDER — NITROGLYCERIN 0.4 MG SL SUBL: 0.4000 mg | SUBLINGUAL_TABLET | SUBLINGUAL | Status: DC | PRN

## 2015-03-29 MED ORDER — HEPARIN (PORCINE) IN NACL 2-0.9 UNIT/ML-% IJ SOLN
INTRAMUSCULAR | Status: DC | PRN
  Administered 2015-03-29: 1000 mL

## 2015-03-29 MED ORDER — VERAPAMIL HCL 2.5 MG/ML IV SOLN
INTRAVENOUS | Status: DC | PRN
  Administered 2015-03-29: 10 mL via INTRA_ARTERIAL

## 2015-03-29 MED ORDER — MIDAZOLAM HCL 2 MG/2ML IJ SOLN
INTRAMUSCULAR | Status: AC
  Filled 2015-03-29: qty 2

## 2015-03-29 MED ORDER — ONDANSETRON HCL 4 MG/2ML IJ SOLN: 4.0000 mg | Freq: Four times a day (QID) | INTRAMUSCULAR | Status: DC | PRN

## 2015-03-29 MED ORDER — ACETAMINOPHEN 325 MG PO TABS: 650.0000 mg | ORAL_TABLET | ORAL | Status: DC | PRN

## 2015-03-29 SURGICAL SUPPLY — 14 items
CATH INFINITI 5 FR JL3.5 (CATHETERS) ×1 IMPLANT
CATH INFINITI 5FR ANG PIGTAIL (CATHETERS) ×1 IMPLANT
CATH INFINITI JR4 5F (CATHETERS) ×1 IMPLANT
COVER PRB 48X5XTLSCP FOLD TPE (BAG) IMPLANT
COVER PROBE 5X48 (BAG) ×3
DEVICE RAD COMP TR BAND LRG (VASCULAR PRODUCTS) ×3 IMPLANT
GLIDESHEATH SLEND SS 6F .021 (SHEATH) ×1 IMPLANT
KIT HEART LEFT (KITS) ×3 IMPLANT
PACK CARDIAC CATHETERIZATION (CUSTOM PROCEDURE TRAY) ×3 IMPLANT
SYR MEDRAD MARK V 150ML (SYRINGE) ×3 IMPLANT
TRANSDUCER W/STOPCOCK (MISCELLANEOUS) ×3 IMPLANT
TUBING CIL FLEX 10 FLL-RA (TUBING) ×3 IMPLANT
WIRE HI TORQ VERSACORE-J 145CM (WIRE) ×1 IMPLANT
WIRE SAFE-T 1.5MM-J .035X260CM (WIRE) ×1 IMPLANT

## 2015-03-29 NOTE — Progress Notes (Signed)
Pt to wait till Dr Burt Knack able to come and discuss care plan, pt willing to wait.

## 2015-03-29 NOTE — Interval H&P Note (Signed)
Cath Lab Visit (complete for each Cath Lab visit)  Clinical Evaluation Leading to the Procedure:   ACS: No.  Non-ACS:    Anginal Classification: CCS II  Anti-ischemic medical therapy: Maximal Therapy (2 or more classes of medications)  Non-Invasive Test Results: High-risk stress test findings: cardiac mortality >3%/year  Prior CABG: No previous CABG      History and Physical Interval Note:  03/29/2015 3:02 PM  Leonard Edwards  has presented today for surgery, with the diagnosis of abnormal nuclear stress test  The various methods of treatment have been discussed with the patient and family. After consideration of risks, benefits and other options for treatment, the patient has consented to  Procedure(s): Left Heart Cath and Coronary Angiography (N/A) as a surgical intervention .  The patient's history has been reviewed, patient examined, no change in status, stable for surgery.  I have reviewed the patient's chart and labs.  Questions were answered to the patient's satisfaction.     Sherren Mocha

## 2015-03-29 NOTE — Discharge Instructions (Signed)
Radial Site Care °Refer to this sheet in the next few weeks. These instructions provide you with information about caring for yourself after your procedure. Your health care provider may also give you more specific instructions. Your treatment has been planned according to current medical practices, but problems sometimes occur. Call your health care provider if you have any problems or questions after your procedure. °WHAT TO EXPECT AFTER THE PROCEDURE °After your procedure, it is typical to have the following: °· Bruising at the radial site that usually fades within 1-2 weeks. °· Blood collecting in the tissue (hematoma) that may be painful to the touch. It should usually decrease in size and tenderness within 1-2 weeks. °HOME CARE INSTRUCTIONS °· Take medicines only as directed by your health care provider. °· You may shower 24-48 hours after the procedure or as directed by your health care provider. Remove the bandage (dressing) and gently wash the site with plain soap and water. Pat the area dry with a clean towel. Do not rub the site, because this may cause bleeding. °· Do not take baths, swim, or use a hot tub until your health care provider approves. °· Check your insertion site every day for redness, swelling, or drainage. °· Do not apply powder or lotion to the site. °· Do not flex or bend the affected arm for 24 hours or as directed by your health care provider. °· Do not push or pull heavy objects with the affected arm for 24 hours or as directed by your health care provider. °· Do not lift over 10 lb (4.5 kg) for 5 days after your procedure or as directed by your health care provider. °· Ask your health care provider when it is okay to: °¨ Return to work or school. °¨ Resume usual physical activities or sports. °¨ Resume sexual activity. °· Do not drive home if you are discharged the same day as the procedure. Have someone else drive you. °· You may drive 24 hours after the procedure unless otherwise  instructed by your health care provider. °· Do not operate machinery or power tools for 24 hours after the procedure. °· If your procedure was done as an outpatient procedure, which means that you went home the same day as your procedure, a responsible adult should be with you for the first 24 hours after you arrive home. °· Keep all follow-up visits as directed by your health care provider. This is important. °SEEK MEDICAL CARE IF: °· You have a fever. °· You have chills. °· You have increased bleeding from the radial site. Hold pressure on the site. CALL 911 °SEEK IMMEDIATE MEDICAL CARE IF: °· You have unusual pain at the radial site. °· You have redness, warmth, or swelling at the radial site. °· You have drainage (other than a small amount of blood on the dressing) from the radial site. °· The radial site is bleeding, and the bleeding does not stop after 30 minutes of holding steady pressure on the site. °· Your arm or hand becomes pale, cool, tingly, or numb. °  °This information is not intended to replace advice given to you by your health care provider. Make sure you discuss any questions you have with your health care provider. °  °Document Released: 02/22/2010 Document Revised: 02/10/2014 Document Reviewed: 08/08/2013 °Elsevier Interactive Patient Education ©2016 [employer] ° °

## 2015-03-29 NOTE — H&P (View-Only) (Signed)
CARDIOLOGY OFFICE NOTE  Date:  03/28/2015    Leonard Edwards Date of Birth: 1955/03/25 Medical Record [id number]  PCP:  Marton Redwood, MD  Cardiologist:  Burt Knack    Chief Complaint  Patient presents with  . Coronary Artery Disease  . Chest Pain  . Hypertension  . Hyperlipidemia    Pre cath visit - seen for Dr. Burt Knack    History of Present Illness: Leonard Edwards is a 60 y.o. male who presents today for a pre cath visit. Seen for Dr. Burt Knack. He is followed for coronary artery disease and hypertension. He initially presented several years ago with acute coronary syndrome and underwent PCI to right coronary artery in 2006. He developed recurrent anginal symptoms and underwent stenting of the ramus intermedius branch in 2011.  I have seen him several times over the past year or so. He is bad to get off track with taking care of himself. Has typically had some chest tightness with exertion in the setting of missing his medicines.   Seen earlier this month after a URI. Was having spells of chest tightness in the setting of having his medicines - this was new for him. He was really wanting to "work out hard". We sent him for a Myoview - see below.   Comes in today. Here alone. He is doing ok. No real change in his symptoms since his last visit with me. He is worried. BP just fair. No fever or chills. No symptoms at rest. I have restricted his exercise.   Past Medical History  Diagnosis Date  . CAD (coronary artery disease)     DES to distal RCA in 09/2004; DES to Ramus Intermediate 10/2009 following abnormal stress test  . Hyperlipidemia   . Hypertension   . GERD (gastroesophageal reflux disease)   . Hiatal hernia   . Gastric polyp   . Esophageal stricture   . Gout   . Benign prostatic hypertrophy   . Obesity   . Fibromyalgia     Past Surgical History  Procedure Laterality Date  . Des to distal rca in 09/2004; des to ramus intermediate 10/2009 following abnormal stress test   2006, 2011     Medications: Current Outpatient Prescriptions  Medication Sig Dispense Refill  . allopurinol (ZYLOPRIM) 300 MG tablet Take 300 mg by mouth daily as needed (for gout flare ups).     Marland Kitchen amLODipine (NORVASC) 10 MG tablet Take 1 tablet (10 mg total) by mouth daily. 90 tablet 3  . amoxicillin-clavulanate (AUGMENTIN) 875-125 MG tablet Take 1 tablet by mouth 2 (two) times daily. Reported on 03/28/2015  0  . ANDROGEL PUMP 20.25 MG/ACT ([id number]) GEL 1 application.   4  . aspirin 81 MG tablet Take 81 mg by mouth daily.      . Cholecalciferol (VITAMIN D3) 1000 UNITS CAPS Take 1,000 Units by mouth daily.      Marland Kitchen CIALIS 20 MG tablet TAKE ONE TABLET DAILY AS NEEDED FOR     ERECTILE DYSFUNCTION 10 tablet 5  . Coenzyme Q10 (CO Q 10) 100 MG CAPS Take 100 mg by mouth daily.      . fluticasone (FLONASE) 50 MCG/ACT nasal spray TWO SPRAYS DAILY IN NOSE (Patient taking differently: TWO SPRAYS DAILY IN NOSE prn) 16 g 0  . hydrochlorothiazide (HYDRODIURIL) 25 MG tablet TAKE ONE TABLET EACH DAY 30 tablet 0  . lisinopril (PRINIVIL,ZESTRIL) 40 MG tablet TAKE ONE TABLET EACH DAY 30 tablet 2  . LOVAZA 1  G capsule TAKE ONE CAPSULE TWICE A DAY (Patient taking differently: TAKE ONE CAPSULE once A DAY) 60 each 0  . metoprolol succinate (TOPROL-XL) 50 MG 24 hr tablet Take 1 tablet (50 mg total) by mouth 2 (two) times daily. 60 tablet 11  . montelukast (SINGULAIR) 10 MG tablet Take 10 mg by mouth at bedtime.    Marland Kitchen omeprazole (PRILOSEC) 40 MG capsule TAKE ONE CAPSULE EVERY MORNING (Patient taking differently: TAKE ONE CAPSULE daily prn for acid reflux) 30 capsule 1  . rosuvastatin (CRESTOR) 10 MG tablet Take 1 tablet (10 mg total) by mouth daily. 90 tablet 3   No current facility-administered medications for this visit.    Allergies: No Known Allergies  Social History: The patient  reports that he has never smoked. He has never used smokeless tobacco. He reports that he drinks about 3.0 oz of alcohol per week.  He reports that he does not use illicit drugs.   Family History: The patient's family history includes COPD in his mother; Diabetes type II in his sister; Heart attack in his father; Lupus in his sister. There is no history of Colon cancer, Pancreatic cancer, or Stomach cancer.   Review of Systems: Please see the history of present illness.   Otherwise, the review of systems is positive for none.   All other systems are reviewed and negative.   Physical Exam: VS:  BP 140/82 mmHg  Pulse 54  Ht 5\' 10"  (1.778 m)  Wt 269 lb (122.018 kg)  BMI 38.60 kg/m2  SpO2 98% .  BMI Body mass index is 38.6 kg/(m^2).  Wt Readings from Last 3 Encounters:  03/28/15 269 lb (122.018 kg)  03/27/15 266 lb (120.657 kg)  03/19/15 266 lb 3.2 oz (120.748 kg)    General: Pleasant. Well developed, well nourished and in no acute distress.  HEENT: Normal. Neck: Supple, no JVD, carotid bruits, or masses noted.  Cardiac: Regular rate and rhythm. No murmurs, rubs, or gallops. No edema.  Respiratory:  Lungs are clear to auscultation bilaterally with normal work of breathing.  GI: Soft and nontender.  MS: No deformity or atrophy. Gait and ROM intact. Skin: Warm and dry. Color is normal.  Neuro:  Strength and sensation are intact and no gross focal deficits noted.  Psych: Alert, appropriate and with normal affect.   LABORATORY DATA:  EKG:  EKG is not ordered today.  Lab Results  Component Value Date   WBC 9.9 07/17/2014   HGB 16.4 07/17/2014   HCT 48.3 07/17/2014   PLT 288.0 07/17/2014   GLUCOSE 104* 07/17/2014   CHOL 133 07/17/2014   TRIG 121.0 07/17/2014   HDL 34.60* 07/17/2014   LDLDIRECT 68.2 10/25/2010   LDLCALC 74 07/17/2014   ALT 30 07/17/2014   AST 26 07/17/2014   NA 138 07/17/2014   K 3.5 07/17/2014   CL 102 07/17/2014   CREATININE 0.74 07/17/2014   BUN 14 07/17/2014   CO2 26 07/17/2014   TSH 1.81 07/17/2014   PSA 1.03 08/29/2009   INR 1.1 ratio* 10/29/2009    BNP (last 3  results) No results for input(s): BNP in the last 8760 hours.  ProBNP (last 3 results) No results for input(s): PROBNP in the last 8760 hours.   Other Studies Reviewed Today: Myoview Study Highlights from 03/27/2015    The left ventricular ejection fraction is moderately decreased (30-44%).  Nuclear stress EF: 43%.  This is a high risk study.    Addendum by Sueanne Margarita, MD  on Tue Mar 27, 2015 2:14 PM    The left ventricular ejection fraction is moderately decreased (30-44%).  Nuclear stress EF: 43%.  This is a high risk study.    Addendum by Sueanne Margarita, MD on Tue Mar 27, 2015 2:15 PM    The left ventricular ejection fraction is moderately decreased (30-44%).  Nuclear stress EF: 43%.  This is a high risk study.  There is a small defect of mild severity present in the basal anterolateral location. The defect is non-reversible.  There is a large defect of moderate severity present in the basal anteroseptal, basal inferoseptal, mid anteroseptal, mid inferoseptal, apical septal and apex location. The defect is partially reversible and is consistent with infarct with peri- infarct ischemia. There is significant extracardiac activity and diaphragmatic attenuation as well. In the setting of LV dysfunction favor ischemia.      Assessment/Plan: 1. CAD, native vessel with prior PCIs. Now with high risk Myoview in the setting of chest tightness. Have recommended cardiac catheterization. The patient understands that risks include but are not limited to stroke (1 in 1000), death (1 in 30), kidney failure [usually temporary] (1 in 500), bleeding (1 in 200), allergic reaction [possibly serious] (1 in 200), and agrees to proceed. Scheduled for tomorrow with Dr. Burt Knack. Labs are ordered stat in preparation for tomorrow's study.   2. HTN - BP fair on current regimen.   3. Hyperlipidemia - on lovaza and crestor. Labs are drawn at Dr. Raul Del  4. Obesity - long standing  problem.   5. Recurrent URI - he is on antibiotics  Current medicines are reviewed with the patient today.  The patient does not have concerns regarding medicines other than what has been noted above.  The following changes have been made:  See above.  Labs/ tests ordered today include:    Orders Placed This Encounter  Procedures  . Basic metabolic panel  . CBC  . APTT  . Protime-INR     Disposition:   Further disposition to follow.   Patient is agreeable to this plan and will call if any problems develop in the interim.   Signed: Burtis Junes, RN, ANP-C 03/28/2015 4:03 PM  [facility] [address] [place], [place]  [address] Phone: [phone] Fax: [phone]

## 2015-03-30 ENCOUNTER — Other Ambulatory Visit: Payer: Self-pay | Admitting: *Deleted

## 2015-03-30 ENCOUNTER — Encounter (HOSPITAL_COMMUNITY): Payer: Self-pay | Admitting: Cardiovascular Disease

## 2015-03-30 DIAGNOSIS — I2510 Atherosclerotic heart disease of native coronary artery without angina pectoris: Secondary | ICD-10-CM

## 2015-03-30 MED FILL — Nitroglycerin IV Soln 100 MCG/ML in D5W: INTRA_ARTERIAL | Qty: 10 | Status: AC

## 2015-04-03 ENCOUNTER — Institutional Professional Consult (permissible substitution) (INDEPENDENT_AMBULATORY_CARE_PROVIDER_SITE_OTHER): Payer: BLUE CROSS/BLUE SHIELD | Admitting: Surgery

## 2015-04-03 ENCOUNTER — Encounter: Payer: Self-pay | Admitting: Surgery

## 2015-04-03 VITALS — BP 148/88 | HR 69 | Resp 20 | Ht 70.0 in | Wt 260.0 lb

## 2015-04-03 DIAGNOSIS — I2510 Atherosclerotic heart disease of native coronary artery without angina pectoris: Secondary | ICD-10-CM | POA: Diagnosis not present

## 2015-04-03 NOTE — Pre-Procedure Instructions (Signed)
[name]  04/03/2015      Leonard Edwards [place], [place] - [name] [place] [address] Phone: [phone] Fax: [phone]    Your procedure is scheduled on Friday, April 06, 2015 at 7:30 AM.  Report to [facility] Admitting at 5:30 A.M.  Call this number if you have problems the morning of surgery: [phone]    Remember: Bring your Incentive Spirometer ( breathing device) and cardiac teaching packet to the hospital on day of admission.   Do not eat food or drink liquids after midnight Thursday, April 05, 2015  Take these medicines the morning of surgery with A SIP OF WATER: Amlidipine (NORVASC), metoprolol succinate (TOPROL-XL), omeprazole (PRILOSEC) if needed: nitroGLYCERIN (NITROSTAT) for chest pain, allopurinol (ZYLOPRIM) for gout flare ups, fluticasone (FLONASE)  nasal spray   Stop taking vitamins, fish oil (LOVAZA),  and herbal medications (Coenzyme Q10). Do not take any NSAIDs ie: Ibuprofen, Advil, Naproxen ( Aleve),  BC and Goody Powder; stop now.   Do not wear jewelry.  Do not wear lotions, powders, or cologne.  You may NOT wear deodorant.  Men may shave face and neck.  Do not bring valuables to the hospital.  [facility] is not responsible for any belongings or valuables.  Contacts, dentures or bridgework may not be worn into surgery.  Leave your suitcase in the car.  After surgery it may be brought to your room.  For patients admitted to the hospital, discharge time will be determined by your treatment team.  Patients discharged the day of surgery will not be allowed to drive home.   Name and phone number of your driver: N/A Special instructions: Shower the night before surgery and the morning of surgery with CHG soap.  Please read over the following fact sheets that you were given. Pain Booklet, Coughing and Deep Breathing, Blood Transfusion Information, Open Heart Packet, MRSA Information and Surgical Site Infection  Prevention

## 2015-04-03 NOTE — Progress Notes (Signed)
Cardiothoracic Surgery Consultation   PCP is Marton Redwood, MD Referring Provider is Sherren Mocha, MD  Chief Complaint  Patient presents with  . Coronary Artery Disease    Surgical eval, Cardiac Cath 03/29/15, ECHO 03/30/15    HPI:  The patient is a 60 year old gentleman with hypertension, hyperlipidemia, and known coronary artery disease s/p DES to the distal RCA in 09/2004 and DES to a large Ramus in 10/2009. He was recently trying to get back to the gym and has noticed exertional fatigue and some chest tightness. He has been getting tired walking around his neighborhood. He occasionally forgets to take his medications and then has noticed more chest discomfort with exertion. He had a recent Myoview that showed an EF of 43% with a small defect of mild severity present in the basal anterolateral location. The defect is non-reversible. There was also a large defect of moderate severity present in the basal anteroseptal, basal inferoseptal, mid anteroseptal, mid inferoseptal, apical septal and apex location. The defect is partially reversible and is consistent with infarct with peri- infarct ischemia. Cardiac cath showed an 80% distal LM stenosis. The ostial and proximal LAD had a 90% stenosis and there was 50% distal LAD stenosis. The RCA has an 80% ostial PDA stenosis. The LVEF was felt to be normal. The Ramus is large with a patent stent. The LCX is a relatively small caliber system with 60% proximal and 70% mid stenoses.    Past Medical History  Diagnosis Date  . CAD (coronary artery disease)     DES to distal RCA in 09/2004; DES to Ramus Intermediate 10/2009 following abnormal stress test  . Hyperlipidemia   . Hypertension   . GERD (gastroesophageal reflux disease)   . Hiatal hernia   . Gastric polyp   . Esophageal stricture   . Gout   . Benign prostatic hypertrophy   . Obesity   . Fibromyalgia     Past Surgical History  Procedure Laterality Date  . Des to distal rca in  09/2004; des to ramus intermediate 10/2009 following abnormal stress test  2006, 2011  . Cardiac catheterization N/A 03/29/2015    Procedure: Left Heart Cath and Coronary Angiography;  Surgeon: Sherren Mocha, MD;  Location: [facility];  Service: Cardiovascular;  Laterality: N/A;  . Ultrasound guidance for vascular access  03/29/2015    Procedure: Ultrasound Guidance For Vascular Access;  Surgeon: Sherren Mocha, MD;  Location: [facility];  Service: Cardiovascular;;    Family History  Problem Relation Age of Onset  . Heart attack Father   . Lupus Sister   . Diabetes type II Sister   . Colon cancer Neg Hx   . Pancreatic cancer Neg Hx   . Stomach cancer Neg Hx   . COPD Mother     Social History Social History  Substance Use Topics  . Smoking status: Never Smoker   . Smokeless tobacco: Never Used  . Alcohol Use: 3.0 oz/week    5 Glasses of wine per week    Current Outpatient Prescriptions  Medication Sig Dispense Refill  . allopurinol (ZYLOPRIM) 300 MG tablet Take 300 mg by mouth daily as needed (for gout flare ups).     Marland Kitchen amLODipine (NORVASC) 10 MG tablet Take 1 tablet (10 mg total) by mouth daily. 90 tablet 3  . ANDROGEL PUMP 20.25 MG/ACT (1.62%) GEL Apply 1 application topically daily.   4  . aspirin 81 MG tablet Take 81 mg by  mouth daily.      . Cholecalciferol (VITAMIN D3) 1000 UNITS CAPS Take 1,000 Units by mouth daily.      Marland Kitchen CIALIS 20 MG tablet TAKE ONE TABLET DAILY AS NEEDED FOR     ERECTILE DYSFUNCTION 10 tablet 5  . Coenzyme Q10 (CO Q 10) 100 MG CAPS Take 100 mg by mouth daily.      . fluticasone (FLONASE) 50 MCG/ACT nasal spray TWO SPRAYS DAILY IN NOSE (Patient taking differently: TWO SPRAYS DAILY IN NOSE prn) 16 g 0  . hydrochlorothiazide (HYDRODIURIL) 25 MG tablet TAKE ONE TABLET EACH DAY 30 tablet 0  . lisinopril (PRINIVIL,ZESTRIL) 40 MG tablet TAKE ONE TABLET EACH DAY 30 tablet 2  . LOVAZA 1 G capsule TAKE ONE CAPSULE TWICE A DAY (Patient taking  differently: TAKE ONE CAPSULE once A DAY) 60 each 0  . metoprolol succinate (TOPROL-XL) 50 MG 24 hr tablet Take 1 tablet (50 mg total) by mouth 2 (two) times daily. 60 tablet 11  . montelukast (SINGULAIR) 10 MG tablet Take 10 mg by mouth at bedtime.    . nitroGLYCERIN (NITROSTAT) 0.4 MG SL tablet Place 1 tablet (0.4 mg total) under the tongue every 5 (five) minutes as needed for chest pain. 30 tablet 12  . omeprazole (PRILOSEC) 40 MG capsule TAKE ONE CAPSULE EVERY MORNING (Patient taking differently: TAKE ONE CAPSULE daily prn for acid reflux) 30 capsule 1  . rosuvastatin (CRESTOR) 10 MG tablet Take 1 tablet (10 mg total) by mouth daily. 90 tablet 3   No current facility-administered medications for this visit.    No Known Allergies  Review of Systems  Constitutional: Positive for fatigue and unexpected weight change.  HENT: Positive for hearing loss.   Eyes: Negative.   Respiratory: Negative.   Cardiovascular: Positive for chest pain and leg swelling.  Gastrointestinal:       Reflux  Endocrine: Negative.   Genitourinary: Positive for frequency.  Musculoskeletal:       Fibromyalgia  Skin: Negative.   Allergic/Immunologic: Negative.   Neurological: Negative.   Hematological: Negative.   Psychiatric/Behavioral: Negative.     BP 148/88 mmHg  Pulse 69  Resp 20  Ht 5\' 10"  (1.778 m)  Wt 260 lb (117.935 kg)  BMI 37.31 kg/m2  SpO2 97% Physical Exam  Constitutional: He is oriented to person, place, and time.  Obese white male in no distress  HENT:  Head: Normocephalic and atraumatic.  Mouth/Throat: Oropharynx is clear and moist.  Eyes: EOM are normal. Pupils are equal, round, and reactive to light.  Neck: Normal range of motion. Neck supple. No thyromegaly present.  Cardiovascular: Normal rate, regular rhythm, normal heart sounds and intact distal pulses.   No murmur heard. Pulmonary/Chest: Effort normal and breath sounds normal. No respiratory distress.  Abdominal: Soft.  Bowel sounds are normal. He exhibits no distension and no mass. There is no tenderness.  Musculoskeletal: Normal range of motion. He exhibits edema.  Mild bilateral ankle and pedal edema  Neurological: He is alert and oriented to person, place, and time. He has normal strength. No cranial nerve deficit or sensory deficit.  Skin: Skin is warm and dry.  Psychiatric: He has a normal mood and affect.     Diagnostic Tests:  Vitals    Height Weight BMI (Calculated)   5\' 10"  (1.778 m) 260 lb (117.935 kg) 37.4    Study Highlights     The left ventricular ejection fraction is moderately decreased (30-44%).  Nuclear stress EF: 43%.  This is a high risk study.    Addendum by Sueanne Margarita, MD on Tue Mar 27, 2015 2:14 PM    The left ventricular ejection fraction is moderately decreased (30-44%).  Nuclear stress EF: 43%.  This is a high risk study.    Addendum by Sueanne Margarita, MD on Tue Mar 27, 2015 2:15 PM    The left ventricular ejection fraction is moderately decreased (30-44%).  Nuclear stress EF: 43%.  This is a high risk study.  There is a small defect of mild severity present in the basal anterolateral location. The defect is non-reversible.  There is a large defect of moderate severity present in the basal anteroseptal, basal inferoseptal, mid anteroseptal, mid inferoseptal, apical septal and apex location. The defect is partially reversible and is consistent with infarct with peri- infarct ischemia. There is significant extracardiac activity and diaphragmatic attenuation as well. In the setting of LV dysfunction favor ischemia.     Nuclear History and Indications    History and Indications Indication for Stress Test: Evaluation of extent and severity of coronary artery disease History: CAD, 2015 MPI EF 50% Cardiac Risk Factors: Hypertension  Symptoms: Chest Pain    Stress Findings    ECG Baseline ECG exhibits normal sinus rhythm..   Stress  Findings The patient exercised following the Bruce protocol.  The patient reported no symptoms during the stress test. The patient experienced non-limiting angina during the stress test.   The test was stopped because the patient complained of fatigue. Patient had 2/10 chest tightness just into recovery that resolved within two minutes.   Blood pressure and heart rate demonstrated a normal response to exercise. Overall, the patient's exercise capacity was normal.   Recovery time: 5 minutes. The patient's response to exercise was adequate for diagnosis.   Response to Stress Arrhythmias during stress: none.  Arrhythmias during recovery: rare PVCs.  Arrhythmias were not significant.  ECG was interpretable and non-conclusive.    Stress Measurements    Baseline Vitals  Rest HR 61 bpm    Rest BP 147/80 mmHg    Exercise Time  Exercise duration (min) 10 min    Exercise duration (sec) 0 sec    Peak Stress Vitals  Peak HR 141 bpm    Peak BP 197/92 mmHg    Exercise Data  MPHR 161 bpm    Percent HR 88 %    Estimated workload 10.6 METS         Nuclear Stress Measurements    LV Systolic Volume 86 mL    TID Q000111Q     LV Diastolic Volume 0000000 mL    LHR 0.43     SSS 11     SRS 7     SDS 4            Nuclear Stress Findings    Isotope administration Rest isotope was administered with an IV injection of 10.2 mCi Tc76m Sestamibi. Rest SPECT images were obtained approximately 45 minutes post tracer injection. Stress isotope was administered with an IV injection of 32.7 mCi Tc81m Sestamibi at peak exercise Stress SPECT images were obtained approximately 30 minutes post tracer injection.   Nuclear Study Quality Diaphragmatic attenuation artifact was present. Image quality affected due to significant extracardiac activity.   Nuclear Measurements Study was gated.   Rest Perfusion Rest perfusion abnormal. There is a defect present in the basal inferoseptal, basal  anterolateral and mid inferoseptal location.   Stress Perfusion Stress perfusion abnormal. There is  a defect present in the basal anteroseptal, basal inferoseptal, basal anterolateral, mid anteroseptal, mid inferoseptal, apical septal and apex location.   Perfusion Summary Defect 1:  There is a small defect of mild severity present in the basal anterolateral location. The defect is non-reversible.  Defect 2:  There is a large defect of moderate severity present in the basal anteroseptal, basal inferoseptal, mid anteroseptal, mid inferoseptal, apical septal and apex location. The defect is partially reversible.   Overall Study Impression Myocardial perfusion is abnormal. There is a small defect of mild severity present in the basal anterolateral location. The defect is non-reversible. There is a large defect of moderate severity present in the basal anteroseptal, basal inferoseptal, mid anteroseptal, mid inferoseptal, apical septal and apex location. The defect is partially reversible and is consistent with infarct with peri- infarct ischemia. This is a high risk study. Overall left ventricular systolic function was abnormal. LV cavity size is normal. Nuclear stress EF: 43%. The left ventricular ejection fraction is moderately decreased (30-44%). A prior study was conducted on 06/22/2013.Compared to the prior study, there are changes.  From: ACCF/SCAI/STS/AATS/AHA/ASNC/HFSA/SCCT 2012 Appropriate Use Criteria for Coronary Revascularization Focused Update    Signed    Electronically signed by Sueanne Margarita, MD on 03/27/15 at 1412 EST   Electronically addended by Sueanne Margarita, MD on 03/27/15 at 1415 EST     Report approved and finalized on 03/27/2015 1412     Sherren Mocha, MD (Primary)      Procedures    Left Heart Cath and Coronary Angiography    Conclusion     Ost LAD to Prox LAD lesion, 90% stenosed.  LM lesion, 80% stenosed.  Dist RCA lesion, 20% stenosed. The lesion  was previously treated with a stent (unknown type).  Ost RPDA to RPDA lesion, 80% stenosed.  1st RPLB lesion, 80% stenosed.  Prox Cx lesion, 60% stenosed.  Mid Cx lesion, 70% stenosed.  Dist LAD lesion, 50% stenosed.  1st Diag lesion, 50% stenosed.  The left ventricular systolic function is normal.  1. Severe distal left main disease 2. Severe ostial LAD stenosis 3. Moderate diffuse left circumflex stenosis 4. Patent ramus stent into a large caliber intermediate vessel 5. Severe distal RCA/ostial PDA stenosis 6. Preserved LV systolic function  TCTS consult for CABG. Pt to see Dr Cyndia Bent next week in consultation. Pt advised to avoid strenuous activity. He has had no symptoms at rest or with low-level activity.     Indications    Abnormal nuclear cardiac imaging test [R93.1 (ICD-10-CM)]    Technique and Indications    INDICATION: Exertional angina, known CAD, progressive symptoms, high-risk nuclear scan with multi-territory ischemia  PROCEDURAL DETAILS: The right wrist was prepped, draped, and anesthetized with 1% lidocaine. Using the modified Seldinger technique, a 5/6 French Slender sheath was introduced into the right radial artery. 3 mg of verapamil was administered through the sheath, weight-based unfractionated heparin was administered intravenously. Standard Judkins catheters were used for selective coronary angiography and left ventriculography. Catheter exchanges were performed over an exchange length guidewire. There were no immediate procedural complications. A TR band was used for radial hemostasis at the completion of the procedure. The patient was transferred to the post catheterization recovery area for further monitoring. Estimated blood loss <50 mL. There were no immediate complications during the procedure.    Coronary Findings    Dominance: Right   Left Main   . LM lesion, 80% stenosed. Calcified. 80% distal left main lesion extending into  the proximal  LAD and intermediate branch     Left Anterior Descending   . Ost LAD to Prox LAD lesion, 90% stenosed. Calcified diffuse.   Jorene Minors LAD lesion, 50% stenosed. Diffuse.   . First Diagonal Branch   . 1st Diag lesion, 50% stenosed. Diffuse.     Ramus Intermedius   . Ost Ramus to Ramus lesion, 0% stenosed. Previously placed Ost Ramus to Ramus stent (unknown type) is patent.     Left Circumflex   . Prox Cx lesion, 60% stenosed.   . Mid Cx lesion, 70% stenosed. Calcified diffuse. Small vessel     Right Coronary Artery   . Dist RCA lesion, 20% stenosed. The lesion was previously treated with a stent (unknown type).   . Right Posterior Descending Artery   . Ost RPDA to RPDA lesion, 80% stenosed.   . First Right Posterolateral   . 1st RPLB lesion, 80% stenosed. Small branch      Wall Motion                 Left Heart    Left Ventricle The left ventricular size is normal. The left ventricular systolic function is normal. The left ventricular ejection fraction is 55-65% by visual estimate.    Coronary Diagrams    Diagnostic Diagram            Implants     No implant documentation for this case.    PACS Images    Show images for Cardiac catheterization     Link to Procedure Log    Procedure Log      Hemo Data       Most Recent Value   AO Systolic Pressure  Q000111Q mmHg   AO Diastolic Pressure  78 mmHg   AO Mean  123456 mmHg   LV Systolic Pressure  [id number] mmHg   LV Diastolic Pressure  0 mmHg   LV EDP  20 mmHg   Arterial Occlusion Pressure Extended Systolic Pressure  Q000111Q mmHg   Arterial Occlusion Pressure Extended Diastolic Pressure  72 mmHg   Arterial Occlusion Pressure Extended Mean Pressure  102 mmHg   Left Ventricular Apex Extended Systolic Pressure  123XX123 mmHg   Left Ventricular Apex Extended Diastolic Pressure  0 mmHg   Left Ventricular Apex Extended EDP Pressure  26 mmHg    Impression:  He has significant left main and three vessel  coronary artery disease with a high risk Myoview stress test and exertional angina. I agree with the need for CABG. He is relatively young so I would plan to use both IMA's and will evaluate the left radial artery with doppler to see if it can be used. Unfortunately he is relatively short with short forearms so the RIMA and left radial may not be long enough but we will see. I discussed the operative procedure with the patient and his wife including alternatives, benefits and risks; including but not limited to bleeding, blood transfusion, infection, stroke, myocardial infarction, graft failure, heart block requiring a permanent pacemaker, organ dysfunction, and death.  Leonard Edwards understands and agrees to proceed.    Plan:  CABG with bilateral IMA's, possible left radial artery graft on Friday 04/06/2014.  Gaye Pollack, MD [facility] [phone]

## 2015-04-04 ENCOUNTER — Ambulatory Visit (HOSPITAL_BASED_OUTPATIENT_CLINIC_OR_DEPARTMENT_OTHER)
Admission: RE | Admit: 2015-04-04 | Discharge: 2015-04-04 | Disposition: A | Discharge status: Home / Self Care | Payer: BLUE CROSS/BLUE SHIELD | Source: Ambulatory Visit | Attending: Surgery | Admitting: Surgery

## 2015-04-04 ENCOUNTER — Encounter (HOSPITAL_COMMUNITY): Payer: Self-pay

## 2015-04-04 ENCOUNTER — Ambulatory Visit (HOSPITAL_COMMUNITY)
Admission: RE | Admit: 2015-04-04 | Discharge: 2015-04-04 | Disposition: A | Discharge status: Home / Self Care | Payer: BLUE CROSS/BLUE SHIELD | Source: Ambulatory Visit | Attending: Surgery | Admitting: Surgery

## 2015-04-04 ENCOUNTER — Encounter (HOSPITAL_COMMUNITY)
Admission: RE | Admit: 2015-04-04 | Discharge: 2015-04-04 | Disposition: A | Discharge status: Home / Self Care | Payer: BLUE CROSS/BLUE SHIELD | Source: Ambulatory Visit | Attending: Surgery | Admitting: Surgery

## 2015-04-04 VITALS — BP 132/67 | HR 59 | Temp 98.6°F | Resp 20 | Ht 70.0 in | Wt 265.4 lb

## 2015-04-04 DIAGNOSIS — I2510 Atherosclerotic heart disease of native coronary artery without angina pectoris: Secondary | ICD-10-CM | POA: Diagnosis not present

## 2015-04-04 HISTORY — DX: Pneumonia, unspecified organism: J18.9

## 2015-04-04 LAB — PULMONARY FUNCTION TEST
DL/VA % PRED: 90 %
DL/VA: 4.13 ml/min/mmHg/L
DLCO COR % PRED: 80 %
DLCO cor: 24.95 ml/min/mmHg
DLCO unc % pred: 81 %
DLCO unc: 25.23 ml/min/mmHg
FEF 25-75 POST: 3.42 L/s
FEF 25-75 PRE: 3.93 L/s
FEF2575-%Change-Post: -12 %
FEF2575-%PRED-POST: 117 %
FEF2575-%PRED-PRE: 134 %
FEV1-%Change-Post: -3 %
FEV1-%Pred-Post: 90 %
FEV1-%Pred-Pre: 94 %
FEV1-Post: 3.19 L
FEV1-Pre: 3.3 L
FEV1FVC-%CHANGE-POST: 5 %
FEV1FVC-%Pred-Pre: 108 %
FEV6-%CHANGE-POST: -7 %
FEV6-%Pred-Post: 82 %
FEV6-%Pred-Pre: 89 %
FEV6-Post: 3.67 L
FEV6-Pre: 3.96 L
FEV6FVC-%Change-Post: 0 %
FEV6FVC-%Pred-Post: 105 %
FEV6FVC-%Pred-Pre: 104 %
FVC-%CHANGE-POST: -8 %
FVC-%PRED-POST: 79 %
FVC-%PRED-PRE: 86 %
FVC-POST: 3.67 L
FVC-PRE: 3.99 L
POST FEV1/FVC RATIO: 87 %
PRE FEV1/FVC RATIO: 83 %
Post FEV6/FVC ratio: 100 %
Pre FEV6/FVC Ratio: 99 %
RV % pred: 87 %
RV: 1.9 L
TLC % PRED: 90 %
TLC: 6.14 L

## 2015-04-04 LAB — URINALYSIS, ROUTINE W REFLEX MICROSCOPIC
Bilirubin Urine: NEGATIVE
GLUCOSE, UA: NEGATIVE mg/dL
HGB URINE DIPSTICK: NEGATIVE
KETONES UR: NEGATIVE mg/dL
LEUKOCYTES UA: NEGATIVE
Nitrite: NEGATIVE
PROTEIN: NEGATIVE mg/dL
Specific Gravity, Urine: 1.023 (ref 1.005–1.030)
pH: 6 (ref 5.0–8.0)

## 2015-04-04 LAB — COMPREHENSIVE METABOLIC PANEL
ALT: 33 U/L (ref 17–63)
ANION GAP: 12 (ref 5–15)
AST: 25 U/L (ref 15–41)
Albumin: 3.7 g/dL (ref 3.5–5.0)
Alkaline Phosphatase: 71 U/L (ref 38–126)
BILIRUBIN TOTAL: 0.9 mg/dL (ref 0.3–1.2)
BUN: 14 mg/dL (ref 6–20)
CO2: 22 mmol/L (ref 22–32)
Calcium: 9.4 mg/dL (ref 8.9–10.3)
Chloride: 106 mmol/L (ref 101–111)
Creatinine, Ser: 0.78 mg/dL (ref 0.61–1.24)
GFR calc Af Amer: >60 mL/min (ref >60)
Glucose, Bld: 117 mg/dL — ABNORMAL HIGH (ref 65–99)
POTASSIUM: 3.7 mmol/L (ref 3.5–5.1)
Sodium: 140 mmol/L (ref 135–145)
Total Protein: 7.3 g/dL (ref 6.5–8.1)

## 2015-04-04 LAB — PROTIME-INR
INR: 1.09 (ref 0.00–1.49)
Prothrombin Time: 14.3 seconds (ref 11.6–15.2)

## 2015-04-04 LAB — BLOOD GAS, ARTERIAL
Acid-Base Excess: 2 mmol/L (ref 0.0–2.0)
BICARBONATE: 26 meq/L — AB (ref 20.0–24.0)
FIO2: 0.21
O2 Saturation: 91.9 %
PCO2 ART: 39.8 mmHg (ref 35.0–45.0)
PH ART: 7.431 (ref 7.350–7.450)
Patient temperature: 98.6
TCO2: 27.2 mmol/L (ref 0–100)
pO2, Arterial: 66.2 mmHg — ABNORMAL LOW (ref 80.0–100.0)

## 2015-04-04 LAB — SURGICAL PCR SCREEN
MRSA, PCR: NEGATIVE
STAPHYLOCOCCUS AUREUS: NEGATIVE

## 2015-04-04 LAB — CBC
HEMATOCRIT: 44 % (ref 39.0–52.0)
Hemoglobin: 15 g/dL (ref 13.0–17.0)
MCH: 30.3 pg (ref 26.0–34.0)
MCHC: 34.1 g/dL (ref 30.0–36.0)
MCV: 88.9 fL (ref 78.0–100.0)
Platelets: 301 10*3/uL (ref 150–400)
RBC: 4.95 MIL/uL (ref 4.22–5.81)
RDW: 12.5 % (ref 11.5–15.5)
WBC: 9 10*3/uL (ref 4.0–10.5)

## 2015-04-04 LAB — APTT: aPTT: 29 seconds (ref 24–37)

## 2015-04-04 LAB — TYPE AND SCREEN
ABO/RH(D): A POS
Antibody Screen: NEGATIVE

## 2015-04-04 LAB — ABO/RH: ABO/RH(D): A POS

## 2015-04-04 MED ORDER — ALBUTEROL SULFATE (2.5 MG/3ML) 0.083% IN NEBU
2.5000 mg | INHALATION_SOLUTION | Freq: Once | RESPIRATORY_TRACT | Status: AC
  Administered 2015-04-04: 2.5 mg via RESPIRATORY_TRACT

## 2015-04-04 NOTE — Progress Notes (Signed)
   04/04/15 0816  OBSTRUCTIVE SLEEP APNEA  Have you ever been diagnosed with sleep apnea through a sleep study? No  Do you snore loudly (loud enough to be heard through closed doors)?  0  Do you often feel tired, fatigued, or sleepy during the daytime (such as falling asleep during driving or talking to someone)? 0  Has anyone observed you stop breathing during your sleep? 0  Do you have, or are you being treated for high blood pressure? 1  BMI more than 35 kg/m2? 1  Age > 50 (1-yes) 1  Neck circumference greater than:Male 16 inches or larger, Male 17inches or larger? 1  Male Gender (Yes=1) 1  Obstructive Sleep Apnea Score 5   This patient has screened at risk for sleep apnea using the STOP Bang tool during a pre-surgical visit. A score of 4 or greater is at risk for sleep apnea.

## 2015-04-04 NOTE — Progress Notes (Signed)
Pre-op Cardiac Surgery  Carotid Findings:  There is no obvious evidence of hemodynamically significant internal carotid artery stenosis bilaterally. Vertebral arteries are patent with antegrade flow.  Upper Extremity Right Left  Brachial Pressures 130-Triphasic 130-Triphasic  Radial Waveforms Triphasic Triphasic  Ulnar Waveforms Triphasic Triphasic  Palmar Arch (Allen's Test) Within normal limits. Signal obliterates with radial compression, is unaffected with ulnar compression.   Bilateral dorsalis pedis and posterior tibial arteries exhibit triphasic flow.  04/04/2015 10:29 AM Maudry Mayhew, RVT, RDCS, RDMS

## 2015-04-05 LAB — HEMOGLOBIN A1C
Hgb A1c MFr Bld: 5.9 % — ABNORMAL HIGH (ref 4.8–5.6)
Mean Plasma Glucose: 123 mg/dL

## 2015-04-05 MED ORDER — DEXMEDETOMIDINE HCL IN NACL 400 MCG/100ML IV SOLN
0.1000 ug/kg/h | INTRAVENOUS | Status: AC
Start: 2015-04-06 — End: 2015-04-06
  Administered 2015-04-06: .3 ug/kg/h via INTRAVENOUS
  Filled 2015-04-05: qty 100

## 2015-04-05 MED ORDER — CHLORHEXIDINE GLUCONATE 0.12 % MT SOLN
15.0000 mL | Freq: Once | OROMUCOSAL | Status: AC
  Administered 2015-04-06: 15 mL via OROMUCOSAL
  Filled 2015-04-05: qty 15

## 2015-04-05 MED ORDER — POTASSIUM CHLORIDE 2 MEQ/ML IV SOLN
80.0000 meq | INTRAVENOUS | Status: DC
  Filled 2015-04-05: qty 40

## 2015-04-05 MED ORDER — EPINEPHRINE HCL 1 MG/ML IJ SOLN
0.0000 ug/min | INTRAMUSCULAR | Status: DC
  Filled 2015-04-05: qty 4

## 2015-04-05 MED ORDER — SODIUM CHLORIDE 0.9 % IV SOLN
INTRAVENOUS | Status: DC
  Filled 2015-04-05 (×2): qty 40

## 2015-04-05 MED ORDER — METOPROLOL TARTRATE 12.5 MG HALF TABLET: 12.5000 mg | ORAL_TABLET | Freq: Once | ORAL | Status: DC

## 2015-04-05 MED ORDER — VANCOMYCIN HCL 10 G IV SOLR
1500.0000 mg | INTRAVENOUS | Status: AC
  Administered 2015-04-06: 1500 mg via INTRAVENOUS
  Filled 2015-04-05: qty 1500

## 2015-04-05 MED ORDER — DEXTROSE 5 % IV SOLN
30.0000 ug/min | INTRAVENOUS | Status: AC
  Administered 2015-04-06: 20 ug/min via INTRAVENOUS
  Filled 2015-04-05: qty 2

## 2015-04-05 MED ORDER — DOPAMINE-DEXTROSE 3.2-5 MG/ML-% IV SOLN
0.0000 ug/kg/min | INTRAVENOUS | Status: DC
  Filled 2015-04-05: qty 250

## 2015-04-05 MED ORDER — SODIUM CHLORIDE 0.9 % IV SOLN
INTRAVENOUS | Status: DC
  Filled 2015-04-05: qty 30

## 2015-04-05 MED ORDER — INSULIN REGULAR HUMAN 100 UNIT/ML IJ SOLN
INTRAMUSCULAR | Status: AC
  Administered 2015-04-06: 1 [IU]/h via INTRAVENOUS
  Filled 2015-04-05: qty 2.5

## 2015-04-05 MED ORDER — NITROGLYCERIN IN D5W 200-5 MCG/ML-% IV SOLN
2.0000 ug/min | INTRAVENOUS | Status: AC
  Administered 2015-04-06: 5 ug/min via INTRAVENOUS
  Filled 2015-04-05: qty 250

## 2015-04-05 MED ORDER — DEXTROSE 5 % IV SOLN
1.5000 g | INTRAVENOUS | Status: AC
  Administered 2015-04-06: 1.5 g via INTRAVENOUS
  Administered 2015-04-06: .75 g via INTRAVENOUS
  Filled 2015-04-05 (×2): qty 1.5

## 2015-04-05 MED ORDER — PLASMA-LYTE 148 IV SOLN
INTRAVENOUS | Status: AC
  Administered 2015-04-06: 500 mL
  Filled 2015-04-05: qty 2.5

## 2015-04-05 MED ORDER — MAGNESIUM SULFATE 50 % IJ SOLN
40.0000 meq | INTRAMUSCULAR | Status: DC
  Filled 2015-04-05: qty 10

## 2015-04-05 MED ORDER — DEXTROSE 5 % IV SOLN
750.0000 mg | INTRAVENOUS | Status: DC
  Filled 2015-04-05: qty 750

## 2015-04-05 MED ORDER — CHLORHEXIDINE GLUCONATE 4 % EX LIQD: 30.0000 mL | CUTANEOUS | Status: DC

## 2015-04-05 NOTE — H&P (Signed)
[address]       [place] [address]             [phone]      Cardiothoracic Surgery History and Physical   PCP is Marton Redwood, MD Referring Provider is Sherren Mocha, MD  Chief Complaint  Patient presents with  . Coronary Artery Disease    Surgical eval, Cardiac Cath 03/29/15, ECHO 03/30/15    HPI:  The patient is a 60 year old gentleman with hypertension, hyperlipidemia, and known coronary artery disease s/p DES to the distal RCA in 09/2004 and DES to a large Ramus in 10/2009. He was recently trying to get back to the gym and has noticed exertional fatigue and some chest tightness. He has been getting tired walking around his neighborhood. He occasionally forgets to take his medications and then has noticed more chest discomfort with exertion. He had a recent Myoview that showed an EF of 43% with a small defect of mild severity present in the basal anterolateral location. The defect is non-reversible. There was also a large defect of moderate severity present in the basal anteroseptal, basal inferoseptal, mid anteroseptal, mid inferoseptal, apical septal and apex location. The defect is partially reversible and is consistent with infarct with peri- infarct ischemia. Cardiac cath showed an 80% distal LM stenosis. The ostial and proximal LAD had a 90% stenosis and there was 50% distal LAD stenosis. The RCA has an 80% ostial PDA stenosis. The LVEF was felt to be normal. The Ramus is large with a patent stent. The LCX is a relatively small caliber system with 60% proximal and 70% mid stenoses.    Past Medical History  Diagnosis Date  . CAD (coronary artery disease)     DES to distal RCA in 09/2004; DES to Ramus Intermediate 10/2009 following abnormal stress test  . Hyperlipidemia   . Hypertension   . GERD (gastroesophageal reflux disease)   . Hiatal hernia   . Gastric polyp   . Esophageal stricture   . Gout   .  Benign prostatic hypertrophy   . Obesity   . Fibromyalgia     Past Surgical History  Procedure Laterality Date  . Des to distal rca in 09/2004; des to ramus intermediate 10/2009 following abnormal stress test  2006, 2011  . Cardiac catheterization N/A 03/29/2015    Procedure: Left Heart Cath and Coronary Angiography; Surgeon: Sherren Mocha, MD; Location: [facility]; Service: Cardiovascular; Laterality: N/A;  . Ultrasound guidance for vascular access  03/29/2015    Procedure: Ultrasound Guidance For Vascular Access; Surgeon: Sherren Mocha, MD; Location: [facility]; Service: Cardiovascular;;    Family History  Problem Relation Age of Onset  . Heart attack Father   . Lupus Sister   . Diabetes type II Sister   . Colon cancer Neg Hx   . Pancreatic cancer Neg Hx   . Stomach cancer Neg Hx   . COPD Mother     Social History Social History  Substance Use Topics  . Smoking status: Never Smoker   . Smokeless tobacco: Never Used  . Alcohol Use: 3.0 oz/week    5 Glasses of wine per week    Current Outpatient Prescriptions  Medication Sig Dispense Refill  . allopurinol (ZYLOPRIM) 300 MG tablet Take 300 mg by mouth daily as needed (for gout flare ups).     Marland Kitchen amLODipine (NORVASC) 10 MG tablet Take 1 tablet (10 mg total) by mouth daily.  90 tablet 3  . ANDROGEL PUMP 20.25 MG/ACT (1.62%) GEL Apply 1 application topically daily.   4  . aspirin 81 MG tablet Take 81 mg by mouth daily.     . Cholecalciferol (VITAMIN D3) 1000 UNITS CAPS Take 1,000 Units by mouth daily.     Marland Kitchen CIALIS 20 MG tablet TAKE ONE TABLET DAILY AS NEEDED FOR ERECTILE DYSFUNCTION 10 tablet 5  . Coenzyme Q10 (CO Q 10) 100 MG CAPS Take 100 mg by mouth daily.     . fluticasone (FLONASE) 50 MCG/ACT nasal spray TWO SPRAYS DAILY IN NOSE (Patient taking differently: TWO SPRAYS DAILY IN  NOSE prn) 16 g 0  . hydrochlorothiazide (HYDRODIURIL) 25 MG tablet TAKE ONE TABLET EACH DAY 30 tablet 0  . lisinopril (PRINIVIL,ZESTRIL) 40 MG tablet TAKE ONE TABLET EACH DAY 30 tablet 2  . LOVAZA 1 G capsule TAKE ONE CAPSULE TWICE A DAY (Patient taking differently: TAKE ONE CAPSULE once A DAY) 60 each 0  . metoprolol succinate (TOPROL-XL) 50 MG 24 hr tablet Take 1 tablet (50 mg total) by mouth 2 (two) times daily. 60 tablet 11  . montelukast (SINGULAIR) 10 MG tablet Take 10 mg by mouth at bedtime.    . nitroGLYCERIN (NITROSTAT) 0.4 MG SL tablet Place 1 tablet (0.4 mg total) under the tongue every 5 (five) minutes as needed for chest pain. 30 tablet 12  . omeprazole (PRILOSEC) 40 MG capsule TAKE ONE CAPSULE EVERY MORNING (Patient taking differently: TAKE ONE CAPSULE daily prn for acid reflux) 30 capsule 1  . rosuvastatin (CRESTOR) 10 MG tablet Take 1 tablet (10 mg total) by mouth daily. 90 tablet 3   No current facility-administered medications for this visit.    No Known Allergies  Review of Systems  Constitutional: Positive for fatigue and unexpected weight change.  HENT: Positive for hearing loss.  Eyes: Negative.  Respiratory: Negative.  Cardiovascular: Positive for chest pain and leg swelling.  Gastrointestinal:   Reflux  Endocrine: Negative.  Genitourinary: Positive for frequency.  Musculoskeletal:   Fibromyalgia  Skin: Negative.  Allergic/Immunologic: Negative.  Neurological: Negative.  Hematological: Negative.  Psychiatric/Behavioral: Negative.    BP 148/88 mmHg  Pulse 69  Resp 20  Ht 5\' 10"  (1.778 m)  Wt 260 lb (117.935 kg)  BMI 37.31 kg/m2  SpO2 97% Physical Exam  Constitutional: He is oriented to person, place, and time.  Obese white male in no distress  HENT:  Head: Normocephalic and atraumatic.  Mouth/Throat: Oropharynx is clear and moist.  Eyes: EOM are normal. Pupils are equal, round, and  reactive to light.  Neck: Normal range of motion. Neck supple. No thyromegaly present.  Cardiovascular: Normal rate, regular rhythm, normal heart sounds and intact distal pulses.  No murmur heard. Pulmonary/Chest: Effort normal and breath sounds normal. No respiratory distress.  Abdominal: Soft. Bowel sounds are normal. He exhibits no distension and no mass. There is no tenderness.  Musculoskeletal: Normal range of motion. He exhibits edema.  Mild bilateral ankle and pedal edema  Neurological: He is alert and oriented to person, place, and time. He has normal strength. No cranial nerve deficit or sensory deficit.  Skin: Skin is warm and dry.  Psychiatric: He has a normal mood and affect.     Diagnostic Tests:  Vitals    Height Weight BMI (Calculated)   5\' 10"  (1.778 m) 260 lb (117.935 kg) 37.4    Study Highlights     The left ventricular ejection fraction is moderately decreased (30-44%).  Nuclear  stress EF: 43%.  This is a high risk study.    Addendum by Sueanne Margarita, MD on Tue Mar 27, 2015 2:14 PM    The left ventricular ejection fraction is moderately decreased (30-44%).  Nuclear stress EF: 43%.  This is a high risk study.    Addendum by Sueanne Margarita, MD on Tue Mar 27, 2015 2:15 PM    The left ventricular ejection fraction is moderately decreased (30-44%).  Nuclear stress EF: 43%.  This is a high risk study.  There is a small defect of mild severity present in the basal anterolateral location. The defect is non-reversible.  There is a large defect of moderate severity present in the basal anteroseptal, basal inferoseptal, mid anteroseptal, mid inferoseptal, apical septal and apex location. The defect is partially reversible and is consistent with infarct with peri- infarct ischemia. There is significant extracardiac activity and diaphragmatic attenuation as well. In the setting of LV dysfunction favor ischemia.       Nuclear History and Indications    History and Indications Indication for Stress Test: Evaluation of extent and severity of coronary artery disease History: CAD, 2015 MPI EF 50% Cardiac Risk Factors: Hypertension  Symptoms: Chest Pain    Stress Findings    ECG Baseline ECG exhibits normal sinus rhythm..   Stress Findings The patient exercised following the Bruce protocol.  The patient reported no symptoms during the stress test. The patient experienced non-limiting angina during the stress test.   The test was stopped because the patient complained of fatigue. Patient had 2/10 chest tightness just into recovery that resolved within two minutes.   Blood pressure and heart rate demonstrated a normal response to exercise. Overall, the patient's exercise capacity was normal.   Recovery time: 5 minutes. The patient's response to exercise was adequate for diagnosis.   Response to Stress Arrhythmias during stress: none.  Arrhythmias during recovery: rare PVCs.  Arrhythmias were not significant.  ECG was interpretable and non-conclusive.    Stress Measurements    Baseline Vitals  Rest HR 61 bpm    Rest BP 147/80 mmHg    Exercise Time  Exercise duration (min) 10 min    Exercise duration (sec) 0 sec    Peak Stress Vitals  Peak HR 141 bpm    Peak BP 197/92 mmHg    Exercise Data  MPHR 161 bpm    Percent HR 88 %    Estimated workload 10.6 METS         Nuclear Stress Measurements    LV Systolic Volume 86 mL    TID Q000111Q     LV Diastolic Volume 0000000 mL    LHR 0.43     SSS 11     SRS 7     SDS 4            Nuclear Stress Findings    Isotope administration Rest isotope was administered with an IV injection of 10.2 mCi Tc74m Sestamibi. Rest SPECT images were obtained approximately 45 minutes post tracer injection. Stress isotope was administered with an IV injection of 32.7 mCi  Tc51m Sestamibi at peak exercise Stress SPECT images were obtained approximately 30 minutes post tracer injection.   Nuclear Study Quality Diaphragmatic attenuation artifact was present. Image quality affected due to significant extracardiac activity.   Nuclear Measurements Study was gated.   Rest Perfusion Rest perfusion abnormal. There is a defect present in the basal inferoseptal, basal anterolateral and mid inferoseptal location.   Stress Perfusion  Stress perfusion abnormal. There is a defect present in the basal anteroseptal, basal inferoseptal, basal anterolateral, mid anteroseptal, mid inferoseptal, apical septal and apex location.   Perfusion Summary Defect 1:  There is a small defect of mild severity present in the basal anterolateral location. The defect is non-reversible.  Defect 2:  There is a large defect of moderate severity present in the basal anteroseptal, basal inferoseptal, mid anteroseptal, mid inferoseptal, apical septal and apex location. The defect is partially reversible.   Overall Study Impression Myocardial perfusion is abnormal. There is a small defect of mild severity present in the basal anterolateral location. The defect is non-reversible. There is a large defect of moderate severity present in the basal anteroseptal, basal inferoseptal, mid anteroseptal, mid inferoseptal, apical septal and apex location. The defect is partially reversible and is consistent with infarct with peri- infarct ischemia. This is a high risk study. Overall left ventricular systolic function was abnormal. LV cavity size is normal. Nuclear stress EF: 43%. The left ventricular ejection fraction is moderately decreased (30-44%). A prior study was conducted on 06/22/2013.Compared to the prior study, there are changes.  From: ACCF/SCAI/STS/AATS/AHA/ASNC/HFSA/SCCT 2012 Appropriate Use Criteria for Coronary Revascularization Focused Update    Signed    Electronically  signed by Sueanne Margarita, MD on 03/27/15 at 1412 EST   Electronically addended by Sueanne Margarita, MD on 03/27/15 at 1415 EST     Report approved and finalized on 03/27/2015 1412     Sherren Mocha, MD (Primary)      Procedures    Left Heart Cath and Coronary Angiography    Conclusion     Ost LAD to Prox LAD lesion, 90% stenosed.  LM lesion, 80% stenosed.  Dist RCA lesion, 20% stenosed. The lesion was previously treated with a stent (unknown type).  Ost RPDA to RPDA lesion, 80% stenosed.  1st RPLB lesion, 80% stenosed.  Prox Cx lesion, 60% stenosed.  Mid Cx lesion, 70% stenosed.  Dist LAD lesion, 50% stenosed.  1st Diag lesion, 50% stenosed.  The left ventricular systolic function is normal.  1. Severe distal left main disease 2. Severe ostial LAD stenosis 3. Moderate diffuse left circumflex stenosis 4. Patent ramus stent into a large caliber intermediate vessel 5. Severe distal RCA/ostial PDA stenosis 6. Preserved LV systolic function  TCTS consult for CABG. Pt to see Dr Cyndia Bent next week in consultation. Pt advised to avoid strenuous activity. He has had no symptoms at rest or with low-level activity.     Indications    Abnormal nuclear cardiac imaging test [R93.1 (ICD-10-CM)]    Technique and Indications    INDICATION: Exertional angina, known CAD, progressive symptoms, high-risk nuclear scan with multi-territory ischemia  PROCEDURAL DETAILS: The right wrist was prepped, draped, and anesthetized with 1% lidocaine. Using the modified Seldinger technique, a 5/6 French Slender sheath was introduced into the right radial artery. 3 mg of verapamil was administered through the sheath, weight-based unfractionated heparin was administered intravenously. Standard Judkins catheters were used for selective coronary angiography and left ventriculography. Catheter exchanges were performed over an exchange length guidewire. There were no  immediate procedural complications. A TR band was used for radial hemostasis at the completion of the procedure. The patient was transferred to the post catheterization recovery area for further monitoring. Estimated blood loss <50 mL. There were no immediate complications during the procedure.    Coronary Findings    Dominance: Right   Left Main   . LM lesion, 80% stenosed. Calcified. 80%  distal left main lesion extending into the proximal LAD and intermediate branch     Left Anterior Descending   . Ost LAD to Prox LAD lesion, 90% stenosed. Calcified diffuse.   Jorene Minors LAD lesion, 50% stenosed. Diffuse.   . First Diagonal Branch   . 1st Diag lesion, 50% stenosed. Diffuse.     Ramus Intermedius   . Ost Ramus to Ramus lesion, 0% stenosed. Previously placed Ost Ramus to Ramus stent (unknown type) is patent.     Left Circumflex   . Prox Cx lesion, 60% stenosed.   . Mid Cx lesion, 70% stenosed. Calcified diffuse. Small vessel     Right Coronary Artery   . Dist RCA lesion, 20% stenosed. The lesion was previously treated with a stent (unknown type).   . Right Posterior Descending Artery   . Ost RPDA to RPDA lesion, 80% stenosed.   . First Right Posterolateral   . 1st RPLB lesion, 80% stenosed. Small branch      Wall Motion                 Left Heart    Left Ventricle The left ventricular size is normal. The left ventricular systolic function is normal. The left ventricular ejection fraction is 55-65% by visual estimate.    Coronary Diagrams    Diagnostic Diagram            Implants    No implant documentation for this case.    PACS Images    Show images for Cardiac catheterization     Link to Procedure Log    Procedure Log      Hemo Data       Most Recent Value   AO Systolic Pressure  Q000111Q mmHg   AO Diastolic Pressure  78 mmHg    AO Mean  123456 mmHg   LV Systolic Pressure  A999333 mmHg   LV Diastolic Pressure  0 mmHg   LV EDP  20 mmHg   Arterial Occlusion Pressure Extended Systolic Pressure  Q000111Q mmHg   Arterial Occlusion Pressure Extended Diastolic Pressure  72 mmHg   Arterial Occlusion Pressure Extended Mean Pressure  102 mmHg   Left Ventricular Apex Extended Systolic Pressure  123XX123 mmHg   Left Ventricular Apex Extended Diastolic Pressure  0 mmHg   Left Ventricular Apex Extended EDP Pressure  26 mmHg    Impression:  He has significant left main and three vessel coronary artery disease with a high risk Myoview stress test and exertional angina. I agree with the need for CABG. I will plan to use both IMA's. His preop arterial dopplers show that the left hand is radial artery dependent and the right hand is dominant and used for radial artery cath so can not be used for CABG.  I discussed the operative procedure with the patient and his wife including alternatives, benefits and risks; including but not limited to bleeding, blood transfusion, infection, stroke, myocardial infarction, graft failure, heart block requiring a permanent pacemaker, organ dysfunction, and death. Leonard Edwards understands and agrees to proceed.   Plan:  CABG with bilateral IMA's.

## 2015-04-06 ENCOUNTER — Inpatient Hospital Stay (HOSPITAL_COMMUNITY): Payer: BLUE CROSS/BLUE SHIELD

## 2015-04-06 ENCOUNTER — Encounter (HOSPITAL_COMMUNITY)
Admission: RE | Disposition: A | Discharge status: Home / Self Care | Payer: Self-pay | Source: Ambulatory Visit | Attending: Surgery

## 2015-04-06 ENCOUNTER — Inpatient Hospital Stay (HOSPITAL_COMMUNITY)
Admission: RE | Admit: 2015-04-06 | Discharge: 2015-04-12 | DRG: 236 | Disposition: A | Discharge status: Home / Self Care | Payer: BLUE CROSS/BLUE SHIELD | Source: Ambulatory Visit | Attending: Surgery | Admitting: Surgery

## 2015-04-06 ENCOUNTER — Inpatient Hospital Stay (HOSPITAL_COMMUNITY): Payer: BLUE CROSS/BLUE SHIELD | Admitting: Certified Registered"

## 2015-04-06 ENCOUNTER — Encounter (HOSPITAL_COMMUNITY): Payer: Self-pay | Admitting: *Deleted

## 2015-04-06 DIAGNOSIS — Z7982 Long term (current) use of aspirin: Secondary | ICD-10-CM | POA: Diagnosis not present

## 2015-04-06 DIAGNOSIS — I10 Essential (primary) hypertension: Secondary | ICD-10-CM | POA: Diagnosis present

## 2015-04-06 DIAGNOSIS — K219 Gastro-esophageal reflux disease without esophagitis: Secondary | ICD-10-CM | POA: Diagnosis present

## 2015-04-06 DIAGNOSIS — E119 Type 2 diabetes mellitus without complications: Secondary | ICD-10-CM | POA: Diagnosis present

## 2015-04-06 DIAGNOSIS — M797 Fibromyalgia: Secondary | ICD-10-CM | POA: Diagnosis present

## 2015-04-06 DIAGNOSIS — I2510 Atherosclerotic heart disease of native coronary artery without angina pectoris: Principal | ICD-10-CM | POA: Diagnosis present

## 2015-04-06 DIAGNOSIS — D72829 Elevated white blood cell count, unspecified: Secondary | ICD-10-CM | POA: Diagnosis not present

## 2015-04-06 DIAGNOSIS — J40 Bronchitis, not specified as acute or chronic: Secondary | ICD-10-CM

## 2015-04-06 DIAGNOSIS — Z955 Presence of coronary angioplasty implant and graft: Secondary | ICD-10-CM

## 2015-04-06 DIAGNOSIS — E785 Hyperlipidemia, unspecified: Secondary | ICD-10-CM | POA: Diagnosis present

## 2015-04-06 DIAGNOSIS — R079 Chest pain, unspecified: Secondary | ICD-10-CM | POA: Diagnosis present

## 2015-04-06 DIAGNOSIS — Z951 Presence of aortocoronary bypass graft: Secondary | ICD-10-CM

## 2015-04-06 DIAGNOSIS — N400 Enlarged prostate without lower urinary tract symptoms: Secondary | ICD-10-CM | POA: Diagnosis present

## 2015-04-06 HISTORY — PX: CORONARY ARTERY BYPASS GRAFT: SHX141

## 2015-04-06 HISTORY — PX: TEE WITHOUT CARDIOVERSION: SHX5443

## 2015-04-06 LAB — POCT I-STAT, CHEM 8
BUN: 14 mg/dL (ref 6–20)
BUN: 12 mg/dL (ref 6–20)
BUN: 14 mg/dL (ref 6–20)
BUN: 13 mg/dL (ref 6–20)
BUN: 13 mg/dL (ref 6–20)
BUN: 14 mg/dL (ref 6–20)
BUN: 16 mg/dL (ref 6–20)
CALCIUM ION: 1.08 mmol/L — AB (ref 1.12–1.23)
CALCIUM ION: 1 mmol/L — AB (ref 1.12–1.23)
CALCIUM ION: 1.1 mmol/L — AB (ref 1.12–1.23)
CALCIUM ION: 1.01 mmol/L — AB (ref 1.12–1.23)
CHLORIDE: 113 mmol/L — AB (ref 101–111)
CHLORIDE: 104 mmol/L (ref 101–111)
CHLORIDE: 105 mmol/L (ref 101–111)
CHLORIDE: 104 mmol/L (ref 101–111)
CHLORIDE: 106 mmol/L (ref 101–111)
CREATININE: 0.5 mg/dL — AB (ref 0.61–1.24)
CREATININE: 0.7 mg/dL (ref 0.61–1.24)
CREATININE: 0.7 mg/dL (ref 0.61–1.24)
Calcium, Ion: 1.17 mmol/L (ref 1.12–1.23)
Calcium, Ion: 1.12 mmol/L (ref 1.12–1.23)
Calcium, Ion: 1.18 mmol/L (ref 1.12–1.23)
Chloride: 103 mmol/L (ref 101–111)
Chloride: 112 mmol/L — ABNORMAL HIGH (ref 101–111)
Creatinine, Ser: 0.7 mg/dL (ref 0.61–1.24)
Creatinine, Ser: 0.5 mg/dL — ABNORMAL LOW (ref 0.61–1.24)
Creatinine, Ser: 0.6 mg/dL — ABNORMAL LOW (ref 0.61–1.24)
Creatinine, Ser: 0.8 mg/dL (ref 0.61–1.24)
GLUCOSE: 111 mg/dL — AB (ref 65–99)
GLUCOSE: 112 mg/dL — AB (ref 65–99)
GLUCOSE: 120 mg/dL — AB (ref 65–99)
GLUCOSE: 136 mg/dL — AB (ref 65–99)
GLUCOSE: 139 mg/dL — AB (ref 65–99)
Glucose, Bld: 132 mg/dL — ABNORMAL HIGH (ref 65–99)
Glucose, Bld: 134 mg/dL — ABNORMAL HIGH (ref 65–99)
HCT: 37 % — ABNORMAL LOW (ref 39.0–52.0)
HCT: 31 % — ABNORMAL LOW (ref 39.0–52.0)
HCT: 35 % — ABNORMAL LOW (ref 39.0–52.0)
HCT: 32 % — ABNORMAL LOW (ref 39.0–52.0)
HCT: 30 % — ABNORMAL LOW (ref 39.0–52.0)
HEMATOCRIT: 32 % — AB (ref 39.0–52.0)
HEMATOCRIT: 38 % — AB (ref 39.0–52.0)
HEMOGLOBIN: 10.9 g/dL — AB (ref 13.0–17.0)
Hemoglobin: 12.6 g/dL — ABNORMAL LOW (ref 13.0–17.0)
Hemoglobin: 10.5 g/dL — ABNORMAL LOW (ref 13.0–17.0)
Hemoglobin: 11.9 g/dL — ABNORMAL LOW (ref 13.0–17.0)
Hemoglobin: 10.9 g/dL — ABNORMAL LOW (ref 13.0–17.0)
Hemoglobin: 10.2 g/dL — ABNORMAL LOW (ref 13.0–17.0)
Hemoglobin: 12.9 g/dL — ABNORMAL LOW (ref 13.0–17.0)
POTASSIUM: 4.6 mmol/L (ref 3.5–5.1)
Potassium: 3.8 mmol/L (ref 3.5–5.1)
Potassium: 4.4 mmol/L (ref 3.5–5.1)
Potassium: 4.7 mmol/L (ref 3.5–5.1)
Potassium: 4.8 mmol/L (ref 3.5–5.1)
Potassium: 5.1 mmol/L (ref 3.5–5.1)
Potassium: 4.4 mmol/L (ref 3.5–5.1)
SODIUM: 136 mmol/L (ref 135–145)
SODIUM: 140 mmol/L (ref 135–145)
SODIUM: 133 mmol/L — AB (ref 135–145)
Sodium: 139 mmol/L (ref 135–145)
Sodium: 141 mmol/L (ref 135–145)
Sodium: 140 mmol/L (ref 135–145)
Sodium: 142 mmol/L (ref 135–145)
TCO2: 26 mmol/L (ref 0–100)
TCO2: 26 mmol/L (ref 0–100)
TCO2: 31 mmol/L (ref 0–100)
TCO2: 27 mmol/L (ref 0–100)
TCO2: 23 mmol/L (ref 0–100)
TCO2: 29 mmol/L (ref 0–100)
TCO2: 24 mmol/L (ref 0–100)

## 2015-04-06 LAB — POCT I-STAT 3, ART BLOOD GAS (G3+)
ACID-BASE DEFICIT: 1 mmol/L (ref 0.0–2.0)
Acid-Base Excess: 3 mmol/L — ABNORMAL HIGH (ref 0.0–2.0)
Acid-Base Excess: 1 mmol/L (ref 0.0–2.0)
Acid-base deficit: 1 mmol/L (ref 0.0–2.0)
Acid-base deficit: 2 mmol/L (ref 0.0–2.0)
Acid-base deficit: 4 mmol/L — ABNORMAL HIGH (ref 0.0–2.0)
BICARBONATE: 23.8 meq/L (ref 20.0–24.0)
BICARBONATE: 22.2 meq/L (ref 20.0–24.0)
Bicarbonate: 25.4 mEq/L — ABNORMAL HIGH (ref 20.0–24.0)
Bicarbonate: 28 mEq/L — ABNORMAL HIGH (ref 20.0–24.0)
Bicarbonate: 23.6 mEq/L (ref 20.0–24.0)
Bicarbonate: 26.9 mEq/L — ABNORMAL HIGH (ref 20.0–24.0)
O2 SAT: 99 %
O2 SAT: 100 %
O2 SAT: 94 %
O2 SAT: 91 %
O2 SAT: 89 %
O2 Saturation: 92 %
PCO2 ART: 45.8 mmHg — AB (ref 35.0–45.0)
PCO2 ART: 44.8 mmHg (ref 35.0–45.0)
PCO2 ART: 44 mmHg (ref 35.0–45.0)
PH ART: 7.344 — AB (ref 7.350–7.450)
PH ART: 7.359 (ref 7.350–7.450)
PO2 ART: 66 mmHg — AB (ref 80.0–100.0)
Patient temperature: 37
TCO2: 27 mmol/L (ref 0–100)
TCO2: 29 mmol/L (ref 0–100)
TCO2: 25 mmol/L (ref 0–100)
TCO2: 28 mmol/L (ref 0–100)
TCO2: 25 mmol/L (ref 0–100)
TCO2: 24 mmol/L (ref 0–100)
pCO2 arterial: 40.1 mmHg (ref 35.0–45.0)
pCO2 arterial: 49.5 mmHg — ABNORMAL HIGH (ref 35.0–45.0)
pCO2 arterial: 42.2 mmHg (ref 35.0–45.0)
pH, Arterial: 7.352 (ref 7.350–7.450)
pH, Arterial: 7.404 (ref 7.350–7.450)
pH, Arterial: 7.377 (ref 7.350–7.450)
pH, Arterial: 7.311 — ABNORMAL LOW (ref 7.350–7.450)
pO2, Arterial: 121 mmHg — ABNORMAL HIGH (ref 80.0–100.0)
pO2, Arterial: 445 mmHg — ABNORMAL HIGH (ref 80.0–100.0)
pO2, Arterial: 74 mmHg — ABNORMAL LOW (ref 80.0–100.0)
pO2, Arterial: 65 mmHg — ABNORMAL LOW (ref 80.0–100.0)
pO2, Arterial: 61 mmHg — ABNORMAL LOW (ref 80.0–100.0)

## 2015-04-06 LAB — GLUCOSE, CAPILLARY
Glucose-Capillary: 104 mg/dL — ABNORMAL HIGH (ref 65–99)
Glucose-Capillary: 130 mg/dL — ABNORMAL HIGH (ref 65–99)
Glucose-Capillary: 119 mg/dL — ABNORMAL HIGH (ref 65–99)
Glucose-Capillary: 123 mg/dL — ABNORMAL HIGH (ref 65–99)
Glucose-Capillary: 126 mg/dL — ABNORMAL HIGH (ref 65–99)
Glucose-Capillary: 134 mg/dL — ABNORMAL HIGH (ref 65–99)

## 2015-04-06 LAB — CBC
HCT: 36.8 % — ABNORMAL LOW (ref 39.0–52.0)
HEMATOCRIT: 37.5 % — AB (ref 39.0–52.0)
HEMOGLOBIN: 12.9 g/dL — AB (ref 13.0–17.0)
Hemoglobin: 12.6 g/dL — ABNORMAL LOW (ref 13.0–17.0)
MCH: 30 pg (ref 26.0–34.0)
MCH: 30.4 pg (ref 26.0–34.0)
MCHC: 34.2 g/dL (ref 30.0–36.0)
MCHC: 34.4 g/dL (ref 30.0–36.0)
MCV: 87.6 fL (ref 78.0–100.0)
MCV: 88.4 fL (ref 78.0–100.0)
PLATELETS: 219 10*3/uL (ref 150–400)
Platelets: 239 10*3/uL (ref 150–400)
RBC: 4.2 MIL/uL — AB (ref 4.22–5.81)
RBC: 4.24 MIL/uL (ref 4.22–5.81)
RDW: 12.3 % (ref 11.5–15.5)
RDW: 12.6 % (ref 11.5–15.5)
WBC: 19.2 10*3/uL — ABNORMAL HIGH (ref 4.0–10.5)
WBC: 19.4 10*3/uL — ABNORMAL HIGH (ref 4.0–10.5)

## 2015-04-06 LAB — POCT I-STAT 4, (NA,K, GLUC, HGB,HCT)
Glucose, Bld: 115 mg/dL — ABNORMAL HIGH (ref 65–99)
HEMATOCRIT: 37 % — AB (ref 39.0–52.0)
HEMOGLOBIN: 12.6 g/dL — AB (ref 13.0–17.0)
Potassium: 4.5 mmol/L (ref 3.5–5.1)
SODIUM: 142 mmol/L (ref 135–145)

## 2015-04-06 LAB — PLATELET COUNT: PLATELETS: 194 10*3/uL (ref 150–400)

## 2015-04-06 LAB — HEMOGLOBIN AND HEMATOCRIT, BLOOD
HEMATOCRIT: 34.8 % — AB (ref 39.0–52.0)
Hemoglobin: 12.4 g/dL — ABNORMAL LOW (ref 13.0–17.0)

## 2015-04-06 LAB — APTT: aPTT: 27 seconds (ref 24–37)

## 2015-04-06 LAB — MAGNESIUM: MAGNESIUM: 2.6 mg/dL — AB (ref 1.7–2.4)

## 2015-04-06 LAB — CREATININE, SERUM
Creatinine, Ser: 0.89 mg/dL (ref 0.61–1.24)
GFR calc Af Amer: >60 mL/min (ref >60)
GFR calc non Af Amer: >60 mL/min (ref >60)

## 2015-04-06 LAB — PROTIME-INR
INR: 1.39 (ref 0.00–1.49)
Prothrombin Time: 17.1 seconds — ABNORMAL HIGH (ref 11.6–15.2)

## 2015-04-06 SURGERY — CORONARY ARTERY BYPASS GRAFTING (CABG)
Anesthesia: General | Site: Chest

## 2015-04-06 MED ORDER — THROMBIN 20000 UNITS EX SOLR
OROMUCOSAL | Status: DC | PRN
  Administered 2015-04-06 (×3): 4 mL via TOPICAL

## 2015-04-06 MED ORDER — EPHEDRINE SULFATE 50 MG/ML IJ SOLN
INTRAMUSCULAR | Status: AC
  Filled 2015-04-06: qty 2

## 2015-04-06 MED ORDER — OMEGA-3-ACID ETHYL ESTERS 1 G PO CAPS
1.0000 | ORAL_CAPSULE | Freq: Two times a day (BID) | ORAL | Status: DC
  Administered 2015-04-08 – 2015-04-12 (×9): 1 g via ORAL
  Filled 2015-04-06 (×9): qty 1

## 2015-04-06 MED ORDER — CHLORHEXIDINE GLUCONATE 0.12 % MT SOLN
15.0000 mL | OROMUCOSAL | Status: AC
  Administered 2015-04-06: 15 mL via OROMUCOSAL

## 2015-04-06 MED ORDER — HEMOSTATIC AGENTS (NO CHARGE) OPTIME
TOPICAL | Status: DC | PRN
  Administered 2015-04-06: 1 via TOPICAL

## 2015-04-06 MED ORDER — ASPIRIN 81 MG PO CHEW: 324.0000 mg | CHEWABLE_TABLET | Freq: Every day | ORAL | Status: DC

## 2015-04-06 MED ORDER — METOPROLOL TARTRATE 25 MG/10 ML ORAL SUSPENSION: 12.5000 mg | Freq: Two times a day (BID) | ORAL | Status: DC

## 2015-04-06 MED ORDER — METOPROLOL TARTRATE 1 MG/ML IV SOLN: 2.5000 mg | INTRAVENOUS | Status: DC | PRN

## 2015-04-06 MED ORDER — BISACODYL 10 MG RE SUPP: 10.0000 mg | Freq: Every day | RECTAL | Status: DC

## 2015-04-06 MED ORDER — THROMBIN 20000 UNITS EX SOLR
CUTANEOUS | Status: AC
  Filled 2015-04-06: qty 20000

## 2015-04-06 MED ORDER — VANCOMYCIN HCL IN DEXTROSE 1-5 GM/200ML-% IV SOLN
1000.0000 mg | Freq: Once | INTRAVENOUS | Status: AC
  Administered 2015-04-06: 1000 mg via INTRAVENOUS
  Filled 2015-04-06: qty 200

## 2015-04-06 MED ORDER — FENTANYL CITRATE (PF) 250 MCG/5ML IJ SOLN
INTRAMUSCULAR | Status: AC
  Filled 2015-04-06: qty 25

## 2015-04-06 MED ORDER — SODIUM CHLORIDE 0.9 % IV SOLN
10.0000 g | INTRAVENOUS | Status: DC | PRN
  Administered 2015-04-06: 5 g/h via INTRAVENOUS

## 2015-04-06 MED ORDER — MAGNESIUM SULFATE 4 GM/100ML IV SOLN
4.0000 g | Freq: Once | INTRAVENOUS | Status: AC
  Administered 2015-04-06: 4 g via INTRAVENOUS
  Filled 2015-04-06: qty 100

## 2015-04-06 MED ORDER — PROTAMINE SULFATE 10 MG/ML IV SOLN
INTRAVENOUS | Status: DC | PRN
  Administered 2015-04-06: 350 mg via INTRAVENOUS

## 2015-04-06 MED ORDER — LACTATED RINGERS IV SOLN: INTRAVENOUS | Status: DC

## 2015-04-06 MED ORDER — ACETAMINOPHEN 650 MG RE SUPP
650.0000 mg | Freq: Once | RECTAL | Status: AC
  Administered 2015-04-06: 650 mg via RECTAL

## 2015-04-06 MED ORDER — NITROGLYCERIN IN D5W 200-5 MCG/ML-% IV SOLN: 0.0000 ug/min | INTRAVENOUS | Status: DC

## 2015-04-06 MED ORDER — SODIUM CHLORIDE 0.9% FLUSH
3.0000 mL | Freq: Two times a day (BID) | INTRAVENOUS | Status: DC
  Administered 2015-04-07 – 2015-04-08 (×3): 3 mL via INTRAVENOUS

## 2015-04-06 MED ORDER — CETYLPYRIDINIUM CHLORIDE 0.05 % MT LIQD
7.0000 mL | Freq: Two times a day (BID) | OROMUCOSAL | Status: DC
  Administered 2015-04-06 – 2015-04-08 (×4): 7 mL via OROMUCOSAL

## 2015-04-06 MED ORDER — PROPOFOL 10 MG/ML IV BOLUS
INTRAVENOUS | Status: AC
  Filled 2015-04-06: qty 40

## 2015-04-06 MED ORDER — POTASSIUM CHLORIDE 10 MEQ/50ML IV SOLN: 10.0000 meq | INTRAVENOUS | Status: AC

## 2015-04-06 MED ORDER — INSULIN REGULAR BOLUS VIA INFUSION
0.0000 [IU] | Freq: Three times a day (TID) | INTRAVENOUS | Status: DC
  Filled 2015-04-06: qty 10

## 2015-04-06 MED ORDER — ACETAMINOPHEN 160 MG/5ML PO SOLN
650.0000 mg | Freq: Once | ORAL | Status: AC
Start: 2015-04-06 — End: 2015-04-06

## 2015-04-06 MED ORDER — FLUTICASONE PROPIONATE 50 MCG/ACT NA SUSP
2.0000 | Freq: Every day | NASAL | Status: DC
  Administered 2015-04-07 – 2015-04-12 (×6): 2 via NASAL
  Filled 2015-04-06 (×2): qty 16

## 2015-04-06 MED ORDER — ROCURONIUM BROMIDE 50 MG/5ML IV SOLN
INTRAVENOUS | Status: AC
  Filled 2015-04-06: qty 1

## 2015-04-06 MED ORDER — SODIUM CHLORIDE 0.9% FLUSH: 3.0000 mL | INTRAVENOUS | Status: DC | PRN

## 2015-04-06 MED ORDER — SODIUM CHLORIDE 0.9 % IJ SOLN
INTRAMUSCULAR | Status: AC
  Filled 2015-04-06: qty 10

## 2015-04-06 MED ORDER — LACTATED RINGERS IV SOLN
INTRAVENOUS | Status: DC | PRN
  Administered 2015-04-06 (×2): via INTRAVENOUS

## 2015-04-06 MED ORDER — ROCURONIUM BROMIDE 50 MG/5ML IV SOLN
INTRAVENOUS | Status: AC
  Filled 2015-04-06: qty 2

## 2015-04-06 MED ORDER — ROCURONIUM BROMIDE 100 MG/10ML IV SOLN
INTRAVENOUS | Status: DC | PRN
  Administered 2015-04-06 (×3): 50 mg via INTRAVENOUS
  Administered 2015-04-06: 40 mg via INTRAVENOUS
  Administered 2015-04-06: 60 mg via INTRAVENOUS

## 2015-04-06 MED ORDER — HEPARIN SODIUM (PORCINE) 1000 UNIT/ML IJ SOLN
INTRAMUSCULAR | Status: DC | PRN
  Administered 2015-04-06: 37000 [IU] via INTRAVENOUS

## 2015-04-06 MED ORDER — TESTOSTERONE 20.25 MG/ACT (1.62%) TD GEL: 1.0000 "application " | Freq: Every day | TRANSDERMAL | Status: DC

## 2015-04-06 MED ORDER — PHENYLEPHRINE 40 MCG/ML (10ML) SYRINGE FOR IV PUSH (FOR BLOOD PRESSURE SUPPORT)
PREFILLED_SYRINGE | INTRAVENOUS | Status: AC
  Filled 2015-04-06: qty 10

## 2015-04-06 MED ORDER — ROSUVASTATIN CALCIUM 10 MG PO TABS
10.0000 mg | ORAL_TABLET | Freq: Every day | ORAL | Status: DC
  Administered 2015-04-07 – 2015-04-12 (×6): 10 mg via ORAL
  Filled 2015-04-06 (×6): qty 1

## 2015-04-06 MED ORDER — FENTANYL CITRATE (PF) 250 MCG/5ML IJ SOLN
INTRAMUSCULAR | Status: AC
  Filled 2015-04-06: qty 5

## 2015-04-06 MED ORDER — ALBUMIN HUMAN 5 % IV SOLN: 250.0000 mL | INTRAVENOUS | Status: AC | PRN

## 2015-04-06 MED ORDER — PROPOFOL 10 MG/ML IV BOLUS
INTRAVENOUS | Status: DC | PRN
  Administered 2015-04-06: 50 mg via INTRAVENOUS
  Administered 2015-04-06: 100 mg via INTRAVENOUS
  Administered 2015-04-06: 50 mg via INTRAVENOUS

## 2015-04-06 MED ORDER — TRAMADOL HCL 50 MG PO TABS: 50.0000 mg | ORAL_TABLET | ORAL | Status: DC | PRN

## 2015-04-06 MED ORDER — 0.9 % SODIUM CHLORIDE (POUR BTL) OPTIME
TOPICAL | Status: DC | PRN
  Administered 2015-04-06: 6000 mL

## 2015-04-06 MED ORDER — DEXMEDETOMIDINE HCL IN NACL 200 MCG/50ML IV SOLN: 0.0000 ug/kg/h | INTRAVENOUS | Status: DC

## 2015-04-06 MED ORDER — LACTATED RINGERS IV SOLN: 500.0000 mL | Freq: Once | INTRAVENOUS | Status: DC | PRN

## 2015-04-06 MED ORDER — MIDAZOLAM HCL 2 MG/2ML IJ SOLN: 2.0000 mg | INTRAMUSCULAR | Status: DC | PRN

## 2015-04-06 MED ORDER — ACETAMINOPHEN 160 MG/5ML PO SOLN: 1000.0000 mg | Freq: Four times a day (QID) | ORAL | Status: DC

## 2015-04-06 MED ORDER — MORPHINE SULFATE (PF) 2 MG/ML IV SOLN
1.0000 mg | INTRAVENOUS | Status: AC | PRN
  Administered 2015-04-06: 4 mg via INTRAVENOUS
  Filled 2015-04-06: qty 2

## 2015-04-06 MED ORDER — OXYCODONE HCL 5 MG PO TABS
5.0000 mg | ORAL_TABLET | ORAL | Status: DC | PRN
  Administered 2015-04-06: 5 mg via ORAL
  Administered 2015-04-07: 10 mg via ORAL
  Administered 2015-04-07: 5 mg via ORAL
  Administered 2015-04-07: 10 mg via ORAL
  Administered 2015-04-08: 5 mg via ORAL
  Filled 2015-04-06: qty 1
  Filled 2015-04-06 (×2): qty 2
  Filled 2015-04-06 (×2): qty 1

## 2015-04-06 MED ORDER — ARTIFICIAL TEARS OP OINT
TOPICAL_OINTMENT | OPHTHALMIC | Status: DC | PRN
  Administered 2015-04-06: 1 via OPHTHALMIC

## 2015-04-06 MED ORDER — MORPHINE SULFATE (PF) 2 MG/ML IV SOLN
2.0000 mg | INTRAVENOUS | Status: DC | PRN
  Administered 2015-04-06 (×2): 2 mg via INTRAVENOUS
  Administered 2015-04-06 – 2015-04-07 (×4): 4 mg via INTRAVENOUS
  Administered 2015-04-07: 2 mg via INTRAVENOUS
  Filled 2015-04-06 (×2): qty 2
  Filled 2015-04-06 (×2): qty 1
  Filled 2015-04-06 (×3): qty 2

## 2015-04-06 MED ORDER — BISACODYL 5 MG PO TBEC
10.0000 mg | DELAYED_RELEASE_TABLET | Freq: Every day | ORAL | Status: DC
  Administered 2015-04-07 – 2015-04-08 (×2): 10 mg via ORAL
  Filled 2015-04-06 (×2): qty 2

## 2015-04-06 MED ORDER — ONDANSETRON HCL 4 MG/2ML IJ SOLN: 4.0000 mg | Freq: Four times a day (QID) | INTRAMUSCULAR | Status: DC | PRN

## 2015-04-06 MED ORDER — SODIUM CHLORIDE 0.9 % IV SOLN
INTRAVENOUS | Status: DC
  Administered 2015-04-06: 22:00:00 via INTRAVENOUS
  Filled 2015-04-06 (×2): qty 2.5

## 2015-04-06 MED ORDER — MIDAZOLAM HCL 10 MG/2ML IJ SOLN
INTRAMUSCULAR | Status: AC
  Filled 2015-04-06: qty 2

## 2015-04-06 MED ORDER — METOPROLOL TARTRATE 12.5 MG HALF TABLET
12.5000 mg | ORAL_TABLET | Freq: Two times a day (BID) | ORAL | Status: DC
  Administered 2015-04-07 – 2015-04-08 (×3): 12.5 mg via ORAL
  Filled 2015-04-06 (×3): qty 1

## 2015-04-06 MED ORDER — PANTOPRAZOLE SODIUM 40 MG PO TBEC
40.0000 mg | DELAYED_RELEASE_TABLET | Freq: Every day | ORAL | Status: DC
  Administered 2015-04-07 – 2015-04-08 (×2): 40 mg via ORAL
  Filled 2015-04-06 (×2): qty 1

## 2015-04-06 MED ORDER — PROTAMINE SULFATE 10 MG/ML IV SOLN
INTRAVENOUS | Status: AC
  Filled 2015-04-06: qty 50

## 2015-04-06 MED ORDER — MIDAZOLAM HCL 5 MG/5ML IJ SOLN
INTRAMUSCULAR | Status: DC | PRN
  Administered 2015-04-06 (×2): 2 mg via INTRAVENOUS
  Administered 2015-04-06: 6 mg via INTRAVENOUS
  Administered 2015-04-06: 2 mg via INTRAVENOUS

## 2015-04-06 MED ORDER — SODIUM CHLORIDE 0.45 % IV SOLN
INTRAVENOUS | Status: DC | PRN
  Administered 2015-04-06: 20 mL/h via INTRAVENOUS

## 2015-04-06 MED ORDER — MONTELUKAST SODIUM 10 MG PO TABS
10.0000 mg | ORAL_TABLET | Freq: Every day | ORAL | Status: DC
  Administered 2015-04-07 – 2015-04-11 (×5): 10 mg via ORAL
  Filled 2015-04-06 (×5): qty 1

## 2015-04-06 MED ORDER — PHENYLEPHRINE HCL 10 MG/ML IJ SOLN
0.0000 ug/min | INTRAVENOUS | Status: DC
  Filled 2015-04-06: qty 2

## 2015-04-06 MED ORDER — FAMOTIDINE IN NACL 20-0.9 MG/50ML-% IV SOLN
20.0000 mg | Freq: Two times a day (BID) | INTRAVENOUS | Status: AC
  Administered 2015-04-06: 20 mg via INTRAVENOUS

## 2015-04-06 MED ORDER — DEXMEDETOMIDINE HCL IN NACL 200 MCG/50ML IV SOLN
INTRAVENOUS | Status: AC
  Filled 2015-04-06: qty 50

## 2015-04-06 MED ORDER — CEFUROXIME SODIUM 1.5 G IJ SOLR
1.5000 g | Freq: Two times a day (BID) | INTRAMUSCULAR | Status: AC
  Administered 2015-04-06 – 2015-04-08 (×4): 1.5 g via INTRAVENOUS
  Filled 2015-04-06 (×4): qty 1.5

## 2015-04-06 MED ORDER — PROTAMINE SULFATE 10 MG/ML IV SOLN
INTRAVENOUS | Status: AC
  Filled 2015-04-06: qty 10

## 2015-04-06 MED ORDER — MIDAZOLAM HCL 2 MG/2ML IJ SOLN
INTRAMUSCULAR | Status: AC
  Filled 2015-04-06: qty 2

## 2015-04-06 MED ORDER — DOCUSATE SODIUM 100 MG PO CAPS
200.0000 mg | ORAL_CAPSULE | Freq: Every day | ORAL | Status: DC
  Administered 2015-04-07 – 2015-04-08 (×2): 200 mg via ORAL
  Filled 2015-04-06 (×2): qty 2

## 2015-04-06 MED ORDER — THROMBIN 20000 UNITS EX SOLR
CUTANEOUS | Status: DC | PRN
  Administered 2015-04-06: 20000 [IU] via TOPICAL

## 2015-04-06 MED ORDER — LIDOCAINE HCL (CARDIAC) 20 MG/ML IV SOLN
INTRAVENOUS | Status: DC | PRN
  Administered 2015-04-06: 60 mg via INTRAVENOUS

## 2015-04-06 MED ORDER — ASPIRIN EC 325 MG PO TBEC
325.0000 mg | DELAYED_RELEASE_TABLET | Freq: Every day | ORAL | Status: DC
  Administered 2015-04-07 – 2015-04-08 (×2): 325 mg via ORAL
  Filled 2015-04-06 (×2): qty 1

## 2015-04-06 MED ORDER — SODIUM CHLORIDE 0.9 % IV SOLN: 250.0000 mL | INTRAVENOUS | Status: DC

## 2015-04-06 MED ORDER — ACETAMINOPHEN 500 MG PO TABS
1000.0000 mg | ORAL_TABLET | Freq: Four times a day (QID) | ORAL | Status: DC
  Administered 2015-04-07 – 2015-04-08 (×7): 1000 mg via ORAL
  Filled 2015-04-06 (×7): qty 2

## 2015-04-06 MED ORDER — FENTANYL CITRATE (PF) 100 MCG/2ML IJ SOLN
INTRAMUSCULAR | Status: DC | PRN
  Administered 2015-04-06 (×3): 100 ug via INTRAVENOUS
  Administered 2015-04-06: 250 ug via INTRAVENOUS
  Administered 2015-04-06: 350 ug via INTRAVENOUS
  Administered 2015-04-06: 150 ug via INTRAVENOUS
  Administered 2015-04-06: 200 ug via INTRAVENOUS
  Administered 2015-04-06: 250 ug via INTRAVENOUS

## 2015-04-06 MED ORDER — SODIUM CHLORIDE 0.9 % IV SOLN: INTRAVENOUS | Status: DC

## 2015-04-06 MED FILL — Heparin Sodium (Porcine) Inj 1000 Unit/ML: INTRAMUSCULAR | Qty: 30 | Status: AC

## 2015-04-06 MED FILL — Magnesium Sulfate Inj 50%: INTRAMUSCULAR | Qty: 10 | Status: AC

## 2015-04-06 MED FILL — Potassium Chloride Inj 2 mEq/ML: INTRAVENOUS | Qty: 40 | Status: AC

## 2015-04-06 SURGICAL SUPPLY — 106 items
ADH SKN CLS APL DERMABOND .7 (GAUZE/BANDAGES/DRESSINGS) ×4
BAG DECANTER FOR FLEXI CONT (MISCELLANEOUS) ×3 IMPLANT
BANDAGE ACE 4X5 VEL STRL LF (GAUZE/BANDAGES/DRESSINGS) ×1 IMPLANT
BANDAGE ACE 6X5 VEL STRL LF (GAUZE/BANDAGES/DRESSINGS) ×1 IMPLANT
BANDAGE ELASTIC 4 VELCRO ST LF (GAUZE/BANDAGES/DRESSINGS) ×3 IMPLANT
BANDAGE ELASTIC 6 VELCRO ST LF (GAUZE/BANDAGES/DRESSINGS) ×3 IMPLANT
BASKET HEART (ORDER IN 25'S) (MISCELLANEOUS) ×1
BASKET HEART (ORDER IN 25S) (MISCELLANEOUS) ×2 IMPLANT
BLADE STERNUM SYSTEM 6 (BLADE) ×3 IMPLANT
BLADE SURG 11 STRL SS (BLADE) ×1 IMPLANT
BNDG GAUZE ELAST 4 BULKY (GAUZE/BANDAGES/DRESSINGS) ×3 IMPLANT
CANISTER SUCTION 2500CC (MISCELLANEOUS) ×3 IMPLANT
CANNULA ARTERIAL NVNT 3/8 22FR (MISCELLANEOUS) ×1 IMPLANT
CATH ROBINSON RED A/P 18FR (CATHETERS) ×6 IMPLANT
CATH THORACIC 28FR (CATHETERS) ×4 IMPLANT
CATH THORACIC 36FR (CATHETERS) ×3 IMPLANT
CATH THORACIC 36FR RT ANG (CATHETERS) ×3 IMPLANT
CLIP TI MEDIUM 24 (CLIP) IMPLANT
CLIP TI WIDE RED SMALL 24 (CLIP) ×5 IMPLANT
COVER SURGICAL LIGHT HANDLE (MISCELLANEOUS) ×3 IMPLANT
CRADLE DONUT ADULT HEAD (MISCELLANEOUS) ×3 IMPLANT
DERMABOND ADVANCED (GAUZE/BANDAGES/DRESSINGS) ×2
DERMABOND ADVANCED .7 DNX12 (GAUZE/BANDAGES/DRESSINGS) IMPLANT
DRAPE CARDIOVASCULAR INCISE (DRAPES) ×3
DRAPE SLUSH/WARMER DISC (DRAPES) ×3 IMPLANT
DRAPE SRG 135X102X78XABS (DRAPES) ×2 IMPLANT
DRSG COVADERM 4X14 (GAUZE/BANDAGES/DRESSINGS) ×3 IMPLANT
ELECT CAUTERY BLADE 6.4 (BLADE) ×3 IMPLANT
ELECT REM PT RETURN 9FT ADLT (ELECTROSURGICAL) ×6
ELECTRODE REM PT RTRN 9FT ADLT (ELECTROSURGICAL) ×4 IMPLANT
FELT TEFLON 1X6 (MISCELLANEOUS) ×3 IMPLANT
GAUZE SPONGE 4X4 12PLY STRL (GAUZE/BANDAGES/DRESSINGS) ×6 IMPLANT
GLOVE BIO SURGEON STRL SZ 6 (GLOVE) IMPLANT
GLOVE BIO SURGEON STRL SZ 6.5 (GLOVE) ×12 IMPLANT
GLOVE BIO SURGEON STRL SZ7 (GLOVE) IMPLANT
GLOVE BIO SURGEON STRL SZ7.5 (GLOVE) IMPLANT
GLOVE BIOGEL PI IND STRL 6 (GLOVE) IMPLANT
GLOVE BIOGEL PI IND STRL 6.5 (GLOVE) IMPLANT
GLOVE BIOGEL PI IND STRL 7.0 (GLOVE) IMPLANT
GLOVE BIOGEL PI INDICATOR 6 (GLOVE)
GLOVE BIOGEL PI INDICATOR 6.5 (GLOVE) ×3
GLOVE BIOGEL PI INDICATOR 7.0 (GLOVE) ×2
GLOVE EUDERMIC 7 POWDERFREE (GLOVE) ×6 IMPLANT
GLOVE ORTHO TXT STRL SZ7.5 (GLOVE) IMPLANT
GOWN STRL REUS W/ TWL LRG LVL3 (GOWN DISPOSABLE) ×8 IMPLANT
GOWN STRL REUS W/ TWL XL LVL3 (GOWN DISPOSABLE) ×2 IMPLANT
GOWN STRL REUS W/TWL LRG LVL3 (GOWN DISPOSABLE) ×21
GOWN STRL REUS W/TWL XL LVL3 (GOWN DISPOSABLE) ×3
HEMOSTAT POWDER SURGIFOAM 1G (HEMOSTASIS) ×9 IMPLANT
HEMOSTAT SURGICEL 2X14 (HEMOSTASIS) ×3 IMPLANT
INSERT FOGARTY 61MM (MISCELLANEOUS) IMPLANT
INSERT FOGARTY XLG (MISCELLANEOUS) ×1 IMPLANT
KIT BASIN OR (CUSTOM PROCEDURE TRAY) ×3 IMPLANT
KIT CATH CPB BARTLE (MISCELLANEOUS) ×3 IMPLANT
KIT ROOM TURNOVER OR (KITS) ×3 IMPLANT
KIT SUCTION CATH 14FR (SUCTIONS) ×3 IMPLANT
KIT VASOVIEW W/TROCAR VH 2000 (KITS) ×3 IMPLANT
NS IRRIG 1000ML POUR BTL (IV SOLUTION) ×16 IMPLANT
PACK OPEN HEART (CUSTOM PROCEDURE TRAY) ×3 IMPLANT
PAD ARMBOARD 7.5X6 YLW CONV (MISCELLANEOUS) ×6 IMPLANT
PAD ELECT DEFIB RADIOL ZOLL (MISCELLANEOUS) ×3 IMPLANT
PENCIL BUTTON HOLSTER BLD 10FT (ELECTRODE) ×3 IMPLANT
PUNCH AORTIC ROTATE 4.0MM (MISCELLANEOUS) ×1 IMPLANT
PUNCH AORTIC ROTATE 4.5MM 8IN (MISCELLANEOUS) ×3 IMPLANT
PUNCH AORTIC ROTATE 5MM 8IN (MISCELLANEOUS) IMPLANT
SET CARDIOPLEGIA MPS 5001102 (MISCELLANEOUS) ×1 IMPLANT
SPONGE GAUZE 4X4 12PLY STER LF (GAUZE/BANDAGES/DRESSINGS) ×2 IMPLANT
SPONGE INTESTINAL PEANUT (DISPOSABLE) IMPLANT
SPONGE LAP 18X18 X RAY DECT (DISPOSABLE) ×2 IMPLANT
SPONGE LAP 4X18 X RAY DECT (DISPOSABLE) ×4 IMPLANT
SUT BONE WAX W31G (SUTURE) ×3 IMPLANT
SUT MNCRL AB 4-0 PS2 18 (SUTURE) ×1 IMPLANT
SUT PROLENE 3 0 SH DA (SUTURE) IMPLANT
SUT PROLENE 3 0 SH1 36 (SUTURE) ×3 IMPLANT
SUT PROLENE 4 0 RB 1 (SUTURE)
SUT PROLENE 4 0 SH DA (SUTURE) IMPLANT
SUT PROLENE 4-0 RB1 .5 CRCL 36 (SUTURE) IMPLANT
SUT PROLENE 5 0 C 1 36 (SUTURE) IMPLANT
SUT PROLENE 6 0 C 1 30 (SUTURE) IMPLANT
SUT PROLENE 7 0 BV 1 (SUTURE) IMPLANT
SUT PROLENE 7 0 BV1 MDA (SUTURE) ×4 IMPLANT
SUT PROLENE 8 0 BV175 6 (SUTURE) ×1 IMPLANT
SUT SILK  1 MH (SUTURE) ×1
SUT SILK 1 MH (SUTURE) IMPLANT
SUT SILK 2 0 SH (SUTURE) ×2 IMPLANT
SUT STEEL STERNAL CCS#1 18IN (SUTURE) IMPLANT
SUT STEEL SZ 6 DBL 3X14 BALL (SUTURE) ×3 IMPLANT
SUT VIC AB 1 CTX 36 (SUTURE) ×6
SUT VIC AB 1 CTX36XBRD ANBCTR (SUTURE) ×4 IMPLANT
SUT VIC AB 2-0 CT1 27 (SUTURE) ×3
SUT VIC AB 2-0 CT1 TAPERPNT 27 (SUTURE) IMPLANT
SUT VIC AB 2-0 CTX 27 (SUTURE) IMPLANT
SUT VIC AB 3-0 SH 27 (SUTURE)
SUT VIC AB 3-0 SH 27X BRD (SUTURE) IMPLANT
SUT VIC AB 3-0 X1 27 (SUTURE) IMPLANT
SUT VICRYL 4-0 PS2 18IN ABS (SUTURE) IMPLANT
SUTURE E-PAK OPEN HEART (SUTURE) ×3 IMPLANT
SYSTEM SAHARA CHEST DRAIN ATS (WOUND CARE) ×3 IMPLANT
TAPE CLOTH SURG 4X10 WHT LF (GAUZE/BANDAGES/DRESSINGS) ×1 IMPLANT
TAPE PAPER 2X10 WHT MICROPORE (GAUZE/BANDAGES/DRESSINGS) ×1 IMPLANT
TOWEL OR 17X24 6PK STRL BLUE (TOWEL DISPOSABLE) ×3 IMPLANT
TOWEL OR 17X26 10 PK STRL BLUE (TOWEL DISPOSABLE) ×3 IMPLANT
TRAY FOLEY IC TEMP SENS 16FR (CATHETERS) ×3 IMPLANT
TUBING INSUFFLATION (TUBING) ×3 IMPLANT
UNDERPAD 30X30 INCONTINENT (UNDERPADS AND DIAPERS) ×3 IMPLANT
WATER STERILE IRR 1000ML POUR (IV SOLUTION) ×6 IMPLANT

## 2015-04-06 NOTE — Progress Notes (Signed)
Patient ID: Leonard Edwards, male   DOB: 1955/09/03, 60 y.o.   MRN: [id number]  SICU Evening Rounds:   Hemodynamically stable  CI = 2.6  Extubated   Urine output good  CT output low  CBC    Component Value Date/Time   WBC 19.4* 04/06/2015 1935   WBC 9.9 01/14/2013 0926   RBC 4.24 04/06/2015 1935   RBC 5.16 01/14/2013 0926   HGB 12.9* 04/06/2015 1945   HGB 16.0 01/14/2013 0926   HCT 38.0* 04/06/2015 1945   HCT 49.7 01/14/2013 0926   PLT 239 04/06/2015 1935   MCV 88.4 04/06/2015 1935   MCV 96.3 01/14/2013 0926   MCH 30.4 04/06/2015 1935   MCH 31.0 01/14/2013 0926   MCHC 34.4 04/06/2015 1935   MCHC 32.2 01/14/2013 0926   RDW 12.6 04/06/2015 1935   LYMPHSABS 2.2 10/25/2010 1015   MONOABS 1.4* 10/25/2010 1015   EOSABS 0.2 10/25/2010 1015   BASOSABS 0.1 10/25/2010 1015     BMET    Component Value Date/Time   NA 142 04/06/2015 1945   K 4.4 04/06/2015 1945   CL 106 04/06/2015 1945   CO2 22 04/04/2015 0831   GLUCOSE 134* 04/06/2015 1945   GLUCOSE 96 02/10/2006 0856   BUN 16 04/06/2015 1945   CREATININE 0.80 04/06/2015 1945   CALCIUM 9.4 04/04/2015 0831   GFRNONAA >60 04/04/2015 0831   GFRAA >60 04/04/2015 0831     A/P:  Stable postop course. Continue current plans

## 2015-04-06 NOTE — Interval H&P Note (Signed)
History and Physical Interval Note:  04/06/2015 5:46 AM  Leonard Edwards  has presented today for surgery, with the diagnosis of CAD  The various methods of treatment have been discussed with the patient and family. After consideration of risks, benefits and other options for treatment, the patient has consented to  Procedure(s): CORONARY ARTERY BYPASS GRAFTING (CABG) (N/A) TRANSESOPHAGEAL ECHOCARDIOGRAM (TEE) (N/A) as a surgical intervention .  The patient's history has been reviewed, patient examined, no change in status, stable for surgery.  I have reviewed the patient's chart and labs.  Questions were answered to the patient's satisfaction.     Gaye Pollack

## 2015-04-06 NOTE — Anesthesia Preprocedure Evaluation (Addendum)
Anesthesia Evaluation  Patient identified by MRN, date of birth, ID band Patient awake    Reviewed: Allergy & Precautions, NPO status , Patient's Chart, lab work & pertinent test results  History of Anesthesia Complications Negative for: history of anesthetic complications  Airway Mallampati: II  TM Distance: >3 FB Neck ROM: Full    Dental  (+) Teeth Intact, Dental Advisory Given   Pulmonary    breath sounds clear to auscultation       Cardiovascular hypertension, Pt. on medications and Pt. on home beta blockers + CAD and + Cardiac Stents   Rhythm:Regular Rate:Normal     Neuro/Psych    GI/Hepatic Neg liver ROS, hiatal hernia, GERD  Medicated and Controlled,  Endo/Other  negative endocrine ROS  Renal/GU      Musculoskeletal  (+) Fibromyalgia -  Abdominal   Peds  Hematology   Anesthesia Other Findings   Reproductive/Obstetrics                           Anesthesia Physical Anesthesia Plan  ASA: IV  Anesthesia Plan: General   Post-op Pain Management:    Induction: Intravenous  Airway Management Planned: Oral ETT  Additional Equipment: Arterial line, PA Cath, TEE and Ultrasound Guidance Line Placement  Intra-op Plan:   Post-operative Plan: Post-operative intubation/ventilation  Informed Consent: I have reviewed the patients History and Physical, chart, labs and discussed the procedure including the risks, benefits and alternatives for the proposed anesthesia with the patient or authorized representative who has indicated his/her understanding and acceptance.   Dental advisory given  Plan Discussed with: CRNA and Anesthesiologist  Anesthesia Plan Comments:         Anesthesia Quick Evaluation

## 2015-04-06 NOTE — Progress Notes (Signed)
Patient placed on CPAP/PSV 10/5 per protocol with no complications, nurse is aware and at bedside. Will continue to monitor pt.

## 2015-04-06 NOTE — Progress Notes (Signed)
  Echocardiogram Echocardiogram Transesophageal has been performed.  Leonard Edwards 04/06/2015, 9:55 AM

## 2015-04-06 NOTE — Brief Op Note (Signed)
04/06/2015  12:38 PM  PATIENT:  Leonard Edwards  60 y.o. male  PRE-OPERATIVE DIAGNOSIS:  CAD  POST-OPERATIVE DIAGNOSIS:  CAD  PROCEDURE: TRANSESOPHAGEAL ECHOCARDIOGRAM (TEE) MEDIAN STERNOTOMY for  CORONARY ARTERY BYPASS GRAFTING (CABG) x  3 (LIMA to LAD, FREE RIMA to RAMUS INTERMEDIATE, and SVG to PDA)  using bilateral mammaries, and right THIGH greater saphenous vein harvested endoscopically   SURGEON:  Surgeon(s) and Role:    * Gaye Pollack, MD - Primary  PHYSICIAN ASSISTANT: Lars Pinks PA-C  ASSISTANTS: Alcide Evener RNFA  ANESTHESIA:   general  EBL:  Total I/O In: 1000 [I.V.:1000] Out: 300 [Urine:300]  DRAINS: Chest tubes placed in the mediastinal and pleural spaces   COUNTS CORRECT:  YES  DICTATION: .Dragon Dictation  PLAN OF CARE: Admit to inpatient   PATIENT DISPOSITION:  ICU - intubated and hemodynamically stable.   Delay start of Pharmacological VTE agent (>24hrs) due to surgical blood loss or risk of bleeding: yes  BASELINE WEIGHT: 120 kg

## 2015-04-06 NOTE — Anesthesia Postprocedure Evaluation (Signed)
Anesthesia Post Note  Patient: Leonard Edwards  Procedure(s) Performed: Procedure(s) (LRB): CORONARY ARTERY BYPASS GRAFTING (CABG) x three,  using bilateral mammaries, and right leg greater saphenous vein harvested endoscopically (N/A) TRANSESOPHAGEAL ECHOCARDIOGRAM (TEE) (N/A)  Patient location during evaluation: SICU Anesthesia Type: General Level of consciousness: patient remains intubated per anesthesia plan Pain management: pain level controlled Vital Signs Assessment: post-procedure vital signs reviewed and stable Respiratory status: patient remains intubated per anesthesia plan Cardiovascular status: stable Anesthetic complications: no    Last Vitals:  Filed Vitals:   04/06/15 0553 04/06/15 1410  BP: 141/73 126/84  Pulse: 58 80  Temp: 36.6 C   Resp: 20 12    Last Pain: There were no vitals filed for this visit.               EDWARDS,Tashica Provencio

## 2015-04-06 NOTE — Care Management Note (Signed)
Case Management Note  Patient Details  Name: Leonard Edwards MRN: [id number] Date of Birth: 1955/05/20  Subjective/Objective:   Pt is s/p CABG                 Action/Plan:  PTA - pt was independent with wife, wife will provide 24 hour supervision at discharge   Expected Discharge Date:                  Expected Discharge Plan:  [place]  In-House Referral:     Discharge planning Services  CM Consult  Post Acute Care Choice:    Choice offered to:     DME Arranged:    DME Agency:     HH Arranged:    [place] Agency:     Status of Service:  In process, will continue to follow  Medicare Important Message Given:    Date Medicare IM Given:    Medicare IM give by:    Date Additional Medicare IM Given:    Additional Medicare Important Message give by:     If discussed at [place] of Stay Meetings, dates discussed:    Additional Comments:  Maryclare Labrador, RN 04/06/2015, 3:37 PM

## 2015-04-06 NOTE — Op Note (Signed)
CARDIOVASCULAR SURGERY OPERATIVE NOTE  04/06/2015  Surgeon:  Gaye Pollack, MD  First Assistant: Lars Pinks,  PA-C   Preoperative Diagnosis:  Severe Left main and multi-vessel coronary artery disease   Postoperative Diagnosis:  Same   Procedure:  1. Median Sternotomy 2. Extracorporeal circulation 3.   Coronary artery bypass grafting x 3   Left internal mammary artery graft to the LAD  Free right internal mammary artery graft to the Ramus  SVG to PDA  4.   Endoscopic vein harvest from the right leg   Anesthesia:  General Endotracheal   Clinical History/Surgical Indication:  The patient is a 60 year old gentleman with hypertension, hyperlipidemia, and known coronary artery disease s/p DES to the distal RCA in 09/2004 and DES to a large Ramus in 10/2009. He was recently trying to get back to the gym and has noticed exertional fatigue and some chest tightness. He has been getting tired walking around his neighborhood. He occasionally forgets to take his medications and then has noticed more chest discomfort with exertion. He had a recent Myoview that showed an EF of 43% with a small defect of mild severity present in the basal anterolateral location. The defect is non-reversible. There was also a large defect of moderate severity present in the basal anteroseptal, basal inferoseptal, mid anteroseptal, mid inferoseptal, apical septal and apex location. The defect is partially reversible and is consistent with infarct with peri- infarct ischemia. Cardiac cath showed an 80% distal LM stenosis. The ostial and proximal LAD had a 90% stenosis and there was 50% distal LAD stenosis. The RCA has an 80% ostial PDA stenosis. The LVEF was felt to be normal. The Ramus is large with a patent stent. The LCX is a relatively small caliber system with 60% proximal and 70% mid stenoses.   He has  significant left main and three vessel coronary artery disease with a high risk Myoview stress test and exertional angina. I agree with the need for CABG. I will plan to use both IMA's. His preop arterial dopplers show that the left hand is radial artery dependent and the right hand is dominant and used for radial artery cath so can not be used for CABG. I discussed the operative procedure with the patient and his wife including alternatives, benefits and risks; including but not limited to bleeding, blood transfusion, infection, stroke, myocardial infarction, graft failure, heart block requiring a permanent pacemaker, organ dysfunction, and death. Ryu COMER BUYER understands and agrees to proceed.    Preparation:  The patient was seen in the preoperative holding area and the correct patient, correct operation were confirmed with the patient after reviewing the medical record and catheterization. The consent was signed by me. Preoperative antibiotics were given. A pulmonary arterial line and radial arterial line were placed by the anesthesia team. The patient was taken back to the operating room and positioned supine on the operating room table. After being placed under general endotracheal anesthesia by the anesthesia team a foley catheter was placed. The neck, chest, abdomen, and both legs were prepped with betadine soap and solution and draped in the usual sterile manner. A surgical time-out was taken and the correct patient and operative procedure were confirmed with the nursing and anesthesia staff.   Cardiopulmonary Bypass:  A median sternotomy was performed. The pericardium was opened in the midline. Right ventricular function appeared normal. The ascending aorta was of normal size and had no palpable plaque. There were no contraindications to aortic cannulation or  cross-clamping. The patient was fully systemically heparinized and the ACT was maintained > 400 sec. The proximal aortic arch was  cannulated with a 44 F aortic cannula for arterial inflow. Venous cannulation was performed via the right atrial appendage using a two-staged venous cannula. An antegrade cardioplegia/vent cannula was inserted into the mid-ascending aorta. Aortic occlusion was performed with a single cross-clamp. Systemic cooling to 32 degrees Centigrade and topical cooling of the heart with iced saline were used. Hyperkalemic antegrade cold blood cardioplegia was used to induce diastolic arrest and was then given at about 20 minute intervals throughout the period of arrest to maintain myocardial temperature at or below 10 degrees centigrade. A temperature probe was inserted into the interventricular septum and an insulating pad was placed in the pericardium.   Left internal mammary harvest:  The left side of the sternum was retracted using the Rultract retractor. The left internal mammary artery was harvested as a pedicle graft. All side branches were clipped. It was a medium-sized vessel of good quality with excellent blood flow. It was ligated distally and divided. It was sprayed with topical papaverine solution to prevent vasospasm.   Endoscopic vein harvest:  The right greater saphenous vein was harvested endoscopically through a 2 cm incision medial to the right knee. It was harvested from the upper thigh to below the knee. It was a medium-sized vein of good quality. The side branches were all ligated with 4-0 silk ties.    Coronary arteries:  The coronary arteries were examined.   LAD:  Moderate sized vessel, diffusely diseased but graftable distally. Small diagonal branches  LCX:  Large Ramus that is intramyocardial throughout. Located in mid-portion in the muscle where there was no disease  RCA:  Diffusely diseased out to the takeoff of the PDA which is a moderate sized vessel with no distal disease. The PL branch is small.   Grafts:  1. LIMA to the LAD: 1.6 mm distally. It was sewn end to side  using 8-0 prolene continuous suture. 2. Free RIMA to the Ramus:  2.5 mm. It was sewn end to side using 8-0 prolene continuous suture. 3. SVG to PDA:  1.75 mm. It was sewn end to side using 7-0 prolene continuous suture.   The proximal vein graft anastomosis was performed to the mid-ascending aorta using continuous 6-0 prolene suture. The proximal RIMA anastomosis was performed to the mid-ascending aorta using continuous 7-0 prolene suture. Graft markers were placed around the proximal anastomoses.   Completion:  The patient was rewarmed to 37 degrees Centigrade. The clamp was removed from the LIMA pedicle and there was rapid warming of the septum and return of ventricular fibrillation. The crossclamp was removed with a time of 80 minutes. There was spontaneous return of sinus rhythm. The distal and proximal anastomoses were checked for hemostasis. The position of the grafts was satisfactory. Two temporary epicardial pacing wires were placed on the right atrium and two on the right ventricle. The patient was weaned from CPB without difficulty on no inotropes. CPB time was 101 minutes. Cardiac output was 6 LPM. TEE showed normal LV function. Heparin was fully reversed with protamine and the aortic and venous cannulas removed. Hemostasis was achieved. Mediastinal and left pleural drainage tubes were placed. The sternum was closed with double #6 stainless steel wires. The fascia was closed with continuous # 1 vicryl suture. The subcutaneous tissue was closed with 2-0 vicryl continuous suture. The skin was closed with 3-0 vicryl subcuticular suture. All sponge, needle,  and instrument counts were reported correct at the end of the case. Dry sterile dressings were placed over the incisions and around the chest tubes which were connected to pleurevac suction. The patient was then transported to the surgical intensive care unit in critical but stable condition.

## 2015-04-06 NOTE — Procedures (Signed)
Extubation Procedure Note  Patient Details:   Name: SOSTENES ERLICH DOB: 1955-12-30 MRN: [id number]   Airway Documentation:   Patient extubated per protocol after passing NIF and VC. Patient extubated to a [place]. Audible cuff leak heard prior to extubation. BS are equal, no stridor present.  Evaluation  O2 sats: stable throughout Complications: No apparent complications Patient did tolerate procedure well. Bilateral Breath Sounds: Clear, Diminished, Rhonchi Suctioning: Airway Yes  Brooke Bonito 04/06/2015, 6:02 PM

## 2015-04-06 NOTE — Progress Notes (Signed)
UR Completed. Essam Lowdermilk, RN, BSN.  [phone] 

## 2015-04-06 NOTE — OR Nursing (Signed)
13:00 - 40 minute call to SICU, 13:30 - 20 minute call to SICU

## 2015-04-06 NOTE — Anesthesia Procedure Notes (Addendum)
Procedure Name: Intubation Date/Time: 04/06/2015 8:18 AM Performed by: Manuela Schwartz B Pre-anesthesia Checklist: Patient identified, Emergency Drugs available, Suction available, Patient being monitored and Timeout performed Patient Re-evaluated:Patient Re-evaluated prior to inductionOxygen Delivery Method: Circle system utilized Preoxygenation: Pre-oxygenation with 100% oxygen Intubation Type: IV induction Ventilation: Mask ventilation without difficulty and Oral airway inserted - appropriate to patient size Laryngoscope Size: Mac and 3 Grade View: Grade II Tube type: Oral Tube size: 8.0 mm Number of attempts: 1 Airway Equipment and Method: Stylet Placement Confirmation: ETT inserted through vocal cords under direct vision,  positive ETCO2 and breath sounds checked- equal and bilateral Secured at: 22 cm Tube secured with: Tape Dental Injury: Teeth and Oropharynx as per pre-operative assessment     Central Venous Catheter Insertion Performed by: anesthesiologist 04/06/2015 7:00 AM Patient location: Pre-op. Preanesthetic checklist: patient identified, IV checked, site marked, risks and benefits discussed, surgical consent, monitors and equipment checked, pre-op evaluation, timeout performed and anesthesia consent Lidocaine 1% used for infiltration Landmarks identified Catheter size: 8.5 Fr Central line was placed.Sheath introducer Procedure performed using ultrasound guided technique. Attempts: 1 Following insertion, line sutured, dressing applied and Biopatch. Post procedure assessment: blood return through all ports, free fluid flow and no air. Patient tolerated the procedure well with no immediate complications.    Central Venous Catheter Insertion Performed by: anesthesiologist Patient location: Pre-op. Preanesthetic checklist: patient identified, IV checked, site marked, risks and benefits discussed, surgical consent, monitors and equipment checked, pre-op evaluation,  timeout performed and anesthesia consent Landmarks identified PA cath was placed.Swan type and PA catheter depth:thermodilationProcedure performed using ultrasound guided technique. Attempts: 1 Patient tolerated the procedure well with no immediate complications.

## 2015-04-06 NOTE — Transfer of Care (Signed)
Immediate Anesthesia Transfer of Care Note  Patient: Leonard Edwards  Procedure(s) Performed: Procedure(s): CORONARY ARTERY BYPASS GRAFTING (CABG) x three,  using bilateral mammaries, and right leg greater saphenous vein harvested endoscopically (N/A) TRANSESOPHAGEAL ECHOCARDIOGRAM (TEE) (N/A)  Patient Location: PACU  Anesthesia Type:General  Level of Consciousness: Patient remains intubated per anesthesia plan  Airway & Oxygen Therapy: Patient remains intubated per anesthesia plan and Patient placed on Ventilator (see vital sign flow sheet for setting)  Post-op Assessment: Report given to RN and Post -op Vital signs reviewed and stable  Post vital signs: Reviewed and stable  Last Vitals:  Filed Vitals:   04/06/15 0553  BP: 141/73  Pulse: 58  Temp: 36.6 C  Resp: 20    Complications: No apparent anesthesia complications

## 2015-04-07 ENCOUNTER — Inpatient Hospital Stay (HOSPITAL_COMMUNITY): Payer: BLUE CROSS/BLUE SHIELD

## 2015-04-07 LAB — BASIC METABOLIC PANEL
ANION GAP: 5 (ref 5–15)
BUN: 15 mg/dL (ref 6–20)
CALCIUM: 8.3 mg/dL — AB (ref 8.9–10.3)
CO2: 27 mmol/L (ref 22–32)
CREATININE: 0.9 mg/dL (ref 0.61–1.24)
Chloride: 109 mmol/L (ref 101–111)
GFR calc non Af Amer: >60 mL/min (ref >60)
Glucose, Bld: 129 mg/dL — ABNORMAL HIGH (ref 65–99)
Potassium: 5 mmol/L (ref 3.5–5.1)
SODIUM: 141 mmol/L (ref 135–145)

## 2015-04-07 LAB — GLUCOSE, CAPILLARY
GLUCOSE-CAPILLARY: 109 mg/dL — AB (ref 65–99)
GLUCOSE-CAPILLARY: 116 mg/dL — AB (ref 65–99)
GLUCOSE-CAPILLARY: 121 mg/dL — AB (ref 65–99)
GLUCOSE-CAPILLARY: 82 mg/dL (ref 65–99)
GLUCOSE-CAPILLARY: 97 mg/dL (ref 65–99)
Glucose-Capillary: 103 mg/dL — ABNORMAL HIGH (ref 65–99)
Glucose-Capillary: 134 mg/dL — ABNORMAL HIGH (ref 65–99)
Glucose-Capillary: 110 mg/dL — ABNORMAL HIGH (ref 65–99)
Glucose-Capillary: 111 mg/dL — ABNORMAL HIGH (ref 65–99)
Glucose-Capillary: 143 mg/dL — ABNORMAL HIGH (ref 65–99)

## 2015-04-07 LAB — POCT I-STAT, CHEM 8
BUN: 21 mg/dL — ABNORMAL HIGH (ref 6–20)
CALCIUM ION: 1.15 mmol/L (ref 1.12–1.23)
CREATININE: 0.9 mg/dL (ref 0.61–1.24)
Chloride: 102 mmol/L (ref 101–111)
Glucose, Bld: 136 mg/dL — ABNORMAL HIGH (ref 65–99)
HCT: 38 % — ABNORMAL LOW (ref 39.0–52.0)
Hemoglobin: 12.9 g/dL — ABNORMAL LOW (ref 13.0–17.0)
POTASSIUM: 4.2 mmol/L (ref 3.5–5.1)
Sodium: 141 mmol/L (ref 135–145)
TCO2: 27 mmol/L (ref 0–100)

## 2015-04-07 LAB — CREATININE, SERUM
Creatinine, Ser: 0.94 mg/dL (ref 0.61–1.24)
GFR calc Af Amer: >60 mL/min (ref >60)
GFR calc non Af Amer: >60 mL/min (ref >60)

## 2015-04-07 LAB — CBC
HCT: 37 % — ABNORMAL LOW (ref 39.0–52.0)
HEMATOCRIT: 36.2 % — AB (ref 39.0–52.0)
HEMOGLOBIN: 12.3 g/dL — AB (ref 13.0–17.0)
Hemoglobin: 11.9 g/dL — ABNORMAL LOW (ref 13.0–17.0)
MCH: 29.5 pg (ref 26.0–34.0)
MCH: 30.3 pg (ref 26.0–34.0)
MCHC: 32.9 g/dL (ref 30.0–36.0)
MCHC: 33.2 g/dL (ref 30.0–36.0)
MCV: 89.8 fL (ref 78.0–100.0)
MCV: 91.1 fL (ref 78.0–100.0)
Platelets: 228 10*3/uL (ref 150–400)
Platelets: 240 10*3/uL (ref 150–400)
RBC: 4.03 MIL/uL — ABNORMAL LOW (ref 4.22–5.81)
RBC: 4.06 MIL/uL — AB (ref 4.22–5.81)
RDW: 12.8 % (ref 11.5–15.5)
RDW: 13.1 % (ref 11.5–15.5)
WBC: 17.1 10*3/uL — AB (ref 4.0–10.5)
WBC: 20.7 10*3/uL — ABNORMAL HIGH (ref 4.0–10.5)

## 2015-04-07 LAB — MAGNESIUM
Magnesium: 2.2 mg/dL (ref 1.7–2.4)
Magnesium: 2 mg/dL (ref 1.7–2.4)

## 2015-04-07 MED ORDER — KETOROLAC TROMETHAMINE 15 MG/ML IJ SOLN
15.0000 mg | Freq: Four times a day (QID) | INTRAMUSCULAR | Status: AC | PRN
  Administered 2015-04-07 – 2015-04-11 (×10): 15 mg via INTRAVENOUS
  Filled 2015-04-07 (×10): qty 1

## 2015-04-07 MED ORDER — ENOXAPARIN SODIUM 40 MG/0.4ML ~~LOC~~ SOLN
40.0000 mg | Freq: Every day | SUBCUTANEOUS | Status: DC
Start: 2015-04-07 — End: 2015-04-12
  Administered 2015-04-07 – 2015-04-11 (×5): 40 mg via SUBCUTANEOUS
  Filled 2015-04-07 (×5): qty 0.4

## 2015-04-07 MED ORDER — FUROSEMIDE 10 MG/ML IJ SOLN
40.0000 mg | Freq: Two times a day (BID) | INTRAMUSCULAR | Status: AC
  Administered 2015-04-07 (×2): 40 mg via INTRAVENOUS
  Filled 2015-04-07 (×2): qty 4

## 2015-04-07 MED ORDER — CALCIUM CARBONATE ANTACID 500 MG PO CHEW
2.0000 | CHEWABLE_TABLET | Freq: Three times a day (TID) | ORAL | Status: DC | PRN
  Administered 2015-04-07: 400 mg via ORAL
  Filled 2015-04-07: qty 2

## 2015-04-07 MED ORDER — INSULIN ASPART 100 UNIT/ML ~~LOC~~ SOLN: 0.0000 [IU] | SUBCUTANEOUS | Status: DC

## 2015-04-07 MED ORDER — INSULIN ASPART 100 UNIT/ML ~~LOC~~ SOLN
0.0000 [IU] | SUBCUTANEOUS | Status: DC
  Administered 2015-04-07 (×2): 2 [IU] via SUBCUTANEOUS

## 2015-04-07 NOTE — Progress Notes (Signed)
1 Day Post-Op Procedure(s) (LRB): CORONARY ARTERY BYPASS GRAFTING (CABG) x three,  using bilateral mammaries, and right leg greater saphenous vein harvested endoscopically (N/A) TRANSESOPHAGEAL ECHOCARDIOGRAM (TEE) (N/A) Subjective: No complaints  Objective: Vital signs in last 24 hours: Temp:  [98.2 F (36.8 C)-99.5 F (37.5 C)] 99 F (37.2 C) (03/04 0800) Pulse Rate:  [78-83] 78 (03/04 0800) Cardiac Rhythm:  [-] Atrial paced (03/04 0800) Resp:  [12-28] 26 (03/04 0800) BP: (91-130)/(61-84) 130/72 mmHg (03/04 0800) SpO2:  [90 %-100 %] 97 % (03/04 0800) Arterial Line BP: (61-140)/(52-81) 68/64 mmHg (03/04 0315) FiO2 (%):  [40 %-55 %] 55 % (03/04 0800) Weight:  [123.1 kg (271 lb 6.2 oz)] 123.1 kg (271 lb 6.2 oz) (03/04 0500)  Hemodynamic parameters for last 24 hours: PAP: (32-41)/(10-31) 34/16 mmHg CO:  [5 L/min-10.2 L/min] 7.3 L/min CI:  [2.1 L/min/m2-4.3 L/min/m2] 3.1 L/min/m2  Intake/Output from previous day: 03/03 0701 - 03/04 0700 In: 4669.5 [I.V.:3739.5; Blood:500; NG/GT:30; IV Piggyback:400] Out: 2810 [Urine:1410; Blood:1000; Chest Tube:400] Intake/Output this shift: Total I/O In: 210 [P.O.:120; I.V.:40; IV Piggyback:50] Out: 60 [Urine:40; Chest Tube:20]  General appearance: alert and cooperative Neurologic: intact Heart: regular rate and rhythm, S1, S2 normal, no murmur, click, rub or gallop Lungs clear Dressings dry  Lab Results:  Recent Labs  04/06/15 1935 04/06/15 1945 04/07/15 0340  WBC 19.4*  --  17.1*  HGB 12.9* 12.9* 11.9*  HCT 37.5* 38.0* 36.2*  PLT 239  --  228   BMET:  Recent Labs  04/06/15 1945 04/07/15 0340  NA 142 141  K 4.4 5.0  CL 106 109  CO2  --  27  GLUCOSE 134* 129*  BUN 16 15  CREATININE 0.80 0.90  CALCIUM  --  8.3*    PT/INR:  Recent Labs  04/06/15 1419  LABPROT 17.1*  INR 1.39   ABG    Component Value Date/Time   PHART 7.311* 04/06/2015 1902   HCO3 22.2 04/06/2015 1902   TCO2 24 04/06/2015 1945   ACIDBASEDEF  4.0* 04/06/2015 1902   O2SAT 89.0 04/06/2015 1902   CBG (last 3)   Recent Labs  04/07/15 0209 04/07/15 0340 04/07/15 0846  GLUCAP 103* 134* 116*   CXR: mild bilateral atelectasis  ECG: sinus rhythm, no acute changes  Assessment/Plan: S/P Procedure(s) (LRB): CORONARY ARTERY BYPASS GRAFTING (CABG) x three,  using bilateral mammaries, and right leg greater saphenous vein harvested endoscopically (N/A) TRANSESOPHAGEAL ECHOCARDIOGRAM (TEE) (N/A)  Hemodynamically stable in sinus rhythm Mobilize, IS Diuresis Diabetes control: preop Hgb A1c 5.9 d/c tubes/lines   LOS: 1 day    Leonard Edwards 04/07/2015

## 2015-04-07 NOTE — Progress Notes (Signed)
Patient ID: Leonard Edwards, male   DOB: 1955-06-18, 60 y.o.   MRN: [id number]  SICU Evening Rounds:  Hemodynamically stable today.  Urine output ok  Pm labs pending.  Continue present plans

## 2015-04-08 ENCOUNTER — Inpatient Hospital Stay (HOSPITAL_COMMUNITY): Payer: BLUE CROSS/BLUE SHIELD

## 2015-04-08 LAB — GLUCOSE, CAPILLARY
GLUCOSE-CAPILLARY: 97 mg/dL (ref 65–99)
Glucose-Capillary: 86 mg/dL (ref 65–99)

## 2015-04-08 LAB — CBC
HCT: 32.8 % — ABNORMAL LOW (ref 39.0–52.0)
Hemoglobin: 10.8 g/dL — ABNORMAL LOW (ref 13.0–17.0)
MCH: 30.1 pg (ref 26.0–34.0)
MCHC: 32.9 g/dL (ref 30.0–36.0)
MCV: 91.4 fL (ref 78.0–100.0)
Platelets: 171 10*3/uL (ref 150–400)
RBC: 3.59 MIL/uL — ABNORMAL LOW (ref 4.22–5.81)
RDW: 13 % (ref 11.5–15.5)
WBC: 16.7 10*3/uL — ABNORMAL HIGH (ref 4.0–10.5)

## 2015-04-08 LAB — BASIC METABOLIC PANEL
Anion gap: 7 (ref 5–15)
BUN: 18 mg/dL (ref 6–20)
CALCIUM: 8.3 mg/dL — AB (ref 8.9–10.3)
CO2: 27 mmol/L (ref 22–32)
Chloride: 105 mmol/L (ref 101–111)
Creatinine, Ser: 0.83 mg/dL (ref 0.61–1.24)
GFR calc Af Amer: >60 mL/min (ref >60)
GLUCOSE: 110 mg/dL — AB (ref 65–99)
Potassium: 3.8 mmol/L (ref 3.5–5.1)
Sodium: 139 mmol/L (ref 135–145)

## 2015-04-08 MED ORDER — FUROSEMIDE 40 MG PO TABS
40.0000 mg | ORAL_TABLET | Freq: Every day | ORAL | Status: AC
  Administered 2015-04-08 – 2015-04-10 (×3): 40 mg via ORAL
  Filled 2015-04-08 (×3): qty 1

## 2015-04-08 MED ORDER — ONDANSETRON HCL 4 MG/2ML IJ SOLN: 4.0000 mg | Freq: Four times a day (QID) | INTRAMUSCULAR | Status: DC | PRN

## 2015-04-08 MED ORDER — POTASSIUM CHLORIDE CRYS ER 20 MEQ PO TBCR
40.0000 meq | EXTENDED_RELEASE_TABLET | Freq: Once | ORAL | Status: AC
  Administered 2015-04-08: 40 meq via ORAL
  Filled 2015-04-08: qty 2

## 2015-04-08 MED ORDER — ONDANSETRON HCL 4 MG PO TABS: 4.0000 mg | ORAL_TABLET | Freq: Four times a day (QID) | ORAL | Status: DC | PRN

## 2015-04-08 MED ORDER — OXYCODONE HCL 5 MG PO TABS
5.0000 mg | ORAL_TABLET | ORAL | Status: DC | PRN
  Administered 2015-04-08: 10 mg via ORAL
  Administered 2015-04-08: 5 mg via ORAL
  Administered 2015-04-09 – 2015-04-10 (×3): 10 mg via ORAL
  Administered 2015-04-10: 5 mg via ORAL
  Administered 2015-04-10: 10 mg via ORAL
  Administered 2015-04-11 (×2): 5 mg via ORAL
  Administered 2015-04-11: 10 mg via ORAL
  Filled 2015-04-08 (×7): qty 2
  Filled 2015-04-08 (×4): qty 1

## 2015-04-08 MED ORDER — DOCUSATE SODIUM 100 MG PO CAPS
200.0000 mg | ORAL_CAPSULE | Freq: Every day | ORAL | Status: DC
  Administered 2015-04-09 – 2015-04-12 (×4): 200 mg via ORAL
  Filled 2015-04-08 (×4): qty 2

## 2015-04-08 MED ORDER — FAMOTIDINE 20 MG PO TABS
20.0000 mg | ORAL_TABLET | Freq: Two times a day (BID) | ORAL | Status: DC
Start: 2015-04-08 — End: 2015-04-12
  Administered 2015-04-08 – 2015-04-12 (×9): 20 mg via ORAL
  Filled 2015-04-08 (×9): qty 1

## 2015-04-08 MED ORDER — SODIUM CHLORIDE 0.9% FLUSH
3.0000 mL | Freq: Two times a day (BID) | INTRAVENOUS | Status: DC
  Administered 2015-04-09 – 2015-04-11 (×6): 3 mL via INTRAVENOUS

## 2015-04-08 MED ORDER — ALLOPURINOL 300 MG PO TABS: 300.0000 mg | ORAL_TABLET | Freq: Every day | ORAL | Status: DC | PRN

## 2015-04-08 MED ORDER — SODIUM CHLORIDE 0.9% FLUSH: 3.0000 mL | INTRAVENOUS | Status: DC | PRN

## 2015-04-08 MED ORDER — BISACODYL 5 MG PO TBEC: 10.0000 mg | DELAYED_RELEASE_TABLET | Freq: Every day | ORAL | Status: DC | PRN

## 2015-04-08 MED ORDER — ASPIRIN EC 325 MG PO TBEC
325.0000 mg | DELAYED_RELEASE_TABLET | Freq: Every day | ORAL | Status: DC
  Administered 2015-04-09 – 2015-04-12 (×4): 325 mg via ORAL
  Filled 2015-04-08 (×4): qty 1

## 2015-04-08 MED ORDER — METOPROLOL TARTRATE 25 MG PO TABS
25.0000 mg | ORAL_TABLET | Freq: Two times a day (BID) | ORAL | Status: DC
  Administered 2015-04-08 – 2015-04-12 (×8): 25 mg via ORAL
  Filled 2015-04-08 (×8): qty 1

## 2015-04-08 MED ORDER — ALUM & MAG HYDROXIDE-SIMETH 200-200-20 MG/5ML PO SUSP: 15.0000 mL | ORAL | Status: DC | PRN

## 2015-04-08 MED ORDER — MOVING RIGHT ALONG BOOK
Freq: Once | Status: AC
  Administered 2015-04-08: 1
  Filled 2015-04-08: qty 1

## 2015-04-08 MED ORDER — BISACODYL 10 MG RE SUPP: 10.0000 mg | Freq: Every day | RECTAL | Status: DC | PRN

## 2015-04-08 MED ORDER — POTASSIUM CHLORIDE CRYS ER 20 MEQ PO TBCR
20.0000 meq | EXTENDED_RELEASE_TABLET | Freq: Two times a day (BID) | ORAL | Status: DC
  Administered 2015-04-09 – 2015-04-10 (×3): 20 meq via ORAL
  Filled 2015-04-08 (×3): qty 1

## 2015-04-08 MED ORDER — SODIUM CHLORIDE 0.9 % IV SOLN: 250.0000 mL | INTRAVENOUS | Status: DC | PRN

## 2015-04-08 NOTE — Progress Notes (Signed)
2 Days Post-Op Procedure(s) (LRB): CORONARY ARTERY BYPASS GRAFTING (CABG) x three,  using bilateral mammaries, and right leg greater saphenous vein harvested endoscopically (N/A) TRANSESOPHAGEAL ECHOCARDIOGRAM (TEE) (N/A) Subjective:  No complaints  Objective: Vital signs in last 24 hours: Temp:  [97.8 F (36.6 C)-99.3 F (37.4 C)] 98.8 F (37.1 C) (03/05 0400) Pulse Rate:  [62-123] 69 (03/05 0700) Cardiac Rhythm:  [-] Normal sinus rhythm (03/05 0400) Resp:  [13-30] 20 (03/05 0700) BP: (88-131)/(52-67) 123/67 mmHg (03/05 0700) SpO2:  [91 %-98 %] 95 % (03/05 0700) Weight:  [121.564 kg (268 lb)] 121.564 kg (268 lb) (03/05 0500)  Hemodynamic parameters for last 24 hours: PAP: (32-34)/(15-19) 34/19 mmHg  Intake/Output from previous day: 03/04 0701 - 03/05 0700 In: 1060 [P.O.:840; I.V.:120; IV Piggyback:100] Out: 2235 [Urine:2170; Chest Tube:65] Intake/Output this shift:    General appearance: alert and cooperative Neurologic: intact Heart: regular rate and rhythm, S1, S2 normal, no murmur, click, rub or gallop Lungs: diminished breath sounds bibasilar Extremities: edema mild Wound: dressing dry  Lab Results:  Recent Labs  04/07/15 1730 04/07/15 1740 04/08/15 0410  WBC 20.7*  --  16.7*  HGB 12.3* 12.9* 10.8*  HCT 37.0* 38.0* 32.8*  PLT 240  --  171   BMET:  Recent Labs  04/07/15 0340  04/07/15 1740 04/08/15 0410  NA 141  --  141 139  K 5.0  --  4.2 3.8  CL 109  --  102 105  CO2 27  --   --  27  GLUCOSE 129*  --  136* 110*  BUN 15  --  21* 18  CREATININE 0.90  < > 0.90 0.83  CALCIUM 8.3*  --   --  8.3*  < > = values in this interval not displayed.  PT/INR:  Recent Labs  04/06/15 1419  LABPROT 17.1*  INR 1.39   ABG    Component Value Date/Time   PHART 7.311* 04/06/2015 1902   HCO3 22.2 04/06/2015 1902   TCO2 27 04/07/2015 1740   ACIDBASEDEF 4.0* 04/06/2015 1902   O2SAT 89.0 04/06/2015 1902   CBG (last 3)   Recent Labs  04/07/15 1925  04/07/15 2326 04/08/15 0358  GLUCAP 143* 97 86   CXR: mild basilar atelectasis  Assessment/Plan: S/P Procedure(s) (LRB): CORONARY ARTERY BYPASS GRAFTING (CABG) x three,  using bilateral mammaries, and right leg greater saphenous vein harvested endoscopically (N/A) TRANSESOPHAGEAL ECHOCARDIOGRAM (TEE) (N/A)  He remains hemodynamically stable in sinus rhythm. Ambulated well yesterday and this morning. Mobilize Diuresis Plan for transfer to step-down: see transfer orders   LOS: 2 days    Gaye Pollack 04/08/2015

## 2015-04-08 NOTE — Progress Notes (Signed)
Called 2 west to give report for transfer.  No nurse available to take report on patient.  Will call again.  Bobette Mo

## 2015-04-08 NOTE — Progress Notes (Signed)
04/08/2015 1445 Received pt to 2W23 from 2S.  Pt is a&o, no c/o voiced at this time.  Tele monitor applied and CCMD notified.  Oriented pt to room, call light and bed.  Call bell in reach.  Family at bedside. Carney Corners

## 2015-04-09 ENCOUNTER — Encounter (HOSPITAL_COMMUNITY): Payer: Self-pay | Admitting: Surgery

## 2015-04-09 LAB — EXPECTORATED SPUTUM ASSESSMENT W GRAM STAIN, RFLX TO RESP C

## 2015-04-09 MED ORDER — IPRATROPIUM-ALBUTEROL 0.5-2.5 (3) MG/3ML IN SOLN
3.0000 mL | Freq: Four times a day (QID) | RESPIRATORY_TRACT | Status: DC
  Administered 2015-04-10 – 2015-04-11 (×5): 3 mL via RESPIRATORY_TRACT
  Filled 2015-04-09 (×6): qty 3

## 2015-04-09 MED ORDER — GUAIFENESIN ER 600 MG PO TB12
600.0000 mg | ORAL_TABLET | Freq: Two times a day (BID) | ORAL | Status: DC
  Administered 2015-04-09 – 2015-04-12 (×7): 600 mg via ORAL
  Filled 2015-04-09 (×8): qty 1

## 2015-04-09 MED ORDER — IPRATROPIUM-ALBUTEROL 0.5-2.5 (3) MG/3ML IN SOLN
3.0000 mL | RESPIRATORY_TRACT | Status: DC
  Administered 2015-04-09 (×4): 3 mL via RESPIRATORY_TRACT
  Filled 2015-04-09 (×3): qty 3

## 2015-04-09 MED ORDER — IPRATROPIUM-ALBUTEROL 0.5-2.5 (3) MG/3ML IN SOLN
RESPIRATORY_TRACT | Status: AC
  Filled 2015-04-09: qty 3

## 2015-04-09 MED FILL — Heparin Sodium (Porcine) Inj 1000 Unit/ML: INTRAMUSCULAR | Qty: 10 | Status: AC

## 2015-04-09 MED FILL — Electrolyte-R (PH 7.4) Solution: INTRAVENOUS | Qty: 3000 | Status: AC

## 2015-04-09 MED FILL — Sodium Chloride IV Soln 0.9%: INTRAVENOUS | Qty: 2000 | Status: AC

## 2015-04-09 MED FILL — Mannitol IV Soln 20%: INTRAVENOUS | Qty: 500 | Status: AC

## 2015-04-09 MED FILL — Lidocaine HCl IV Inj 20 MG/ML: INTRAVENOUS | Qty: 5 | Status: AC

## 2015-04-09 MED FILL — Sodium Bicarbonate IV Soln 8.4%: INTRAVENOUS | Qty: 50 | Status: AC

## 2015-04-09 NOTE — Progress Notes (Addendum)
[address]       [place] [address]             [phone]      3 Days Post-Op Procedure(s) (LRB): CORONARY ARTERY BYPASS GRAFTING (CABG) x three,  using bilateral mammaries, and right leg greater saphenous vein harvested endoscopically (N/A) TRANSESOPHAGEAL ECHOCARDIOGRAM (TEE) (N/A) Subjective: C/o congestion and wheezing  Objective: Vital signs in last 24 hours: Temp:  [98.5 F (36.9 C)-99.4 F (37.4 C)] 99.4 F (37.4 C) (03/06 0626) Pulse Rate:  [65-85] 77 (03/06 0626) Cardiac Rhythm:  [-] Normal sinus rhythm;Bundle branch block (03/05 1900) Resp:  [17-22] 18 (03/06 0626) BP: (116-142)/(64-76) 142/76 mmHg (03/06 0626) SpO2:  [93 %-98 %] 94 % (03/06 0626) Weight:  [266 lb 1.5 oz (120.7 kg)] 266 lb 1.5 oz (120.7 kg) (03/06 0227)  Hemodynamic parameters for last 24 hours:    Intake/Output from previous day: 03/05 0701 - 03/06 0700 In: 56 [P.O.:840; IV Piggyback:50] Out: [id number] [Urine:1035] Intake/Output this shift:    General appearance: alert, cooperative and no distress Heart: regular rate and rhythm Lungs: scattered wheezing Abdomen: benign Extremities: benign Wound: dressings CDI Ext - minor edema   Lab Results:  Recent Labs  04/07/15 1730 04/07/15 1740 04/08/15 0410  WBC 20.7*  --  16.7*  HGB 12.3* 12.9* 10.8*  HCT 37.0* 38.0* 32.8*  PLT 240  --  171   BMET:  Recent Labs  04/07/15 0340  04/07/15 1740 04/08/15 0410  NA 141  --  141 139  K 5.0  --  4.2 3.8  CL 109  --  102 105  CO2 27  --   --  27  GLUCOSE 129*  --  136* 110*  BUN 15  --  21* 18  CREATININE 0.90  < > 0.90 0.83  CALCIUM 8.3*  --   --  8.3*  < > = values in this interval not displayed.  PT/INR:  Recent Labs  04/06/15 1419  LABPROT 17.1*  INR 1.39   ABG    Component Value Date/Time   PHART 7.311* 04/06/2015 1902   HCO3 22.2 04/06/2015 1902   TCO2 27 04/07/2015 1740   ACIDBASEDEF 4.0* 04/06/2015 1902   O2SAT 89.0 04/06/2015 1902   CBG  (last 3)   Recent Labs  04/07/15 1925 04/07/15 2326 04/08/15 0358  GLUCAP 143* 97 86    Meds Scheduled Meds: . aspirin EC  325 mg Oral Daily  . docusate sodium  200 mg Oral Daily  . enoxaparin (LOVENOX) injection  40 mg Subcutaneous QHS  . famotidine  20 mg Oral BID  . fluticasone  2 spray Each Nare Daily  . furosemide  40 mg Oral Daily  . metoprolol tartrate  25 mg Oral BID  . montelukast  10 mg Oral QHS  . omega-3 acid ethyl esters  1 capsule Oral BID  . potassium chloride  20 mEq Oral BID  . rosuvastatin  10 mg Oral Daily  . sodium chloride flush  3 mL Intravenous Q12H   Continuous Infusions:  PRN Meds:.sodium chloride, allopurinol, alum & mag hydroxide-simeth, bisacodyl **OR** bisacodyl, calcium carbonate, ketorolac, ondansetron **OR** ondansetron (ZOFRAN) IV, oxyCODONE, sodium chloride flush  Xrays Dg Chest Port 1 View  04/08/2015  CLINICAL DATA:  Post CABG x 2, coronary artery disease, hypertension, GERD, hiatal hernia EXAM: PORTABLE CHEST 1 VIEW COMPARISON:  Portable exam 0539 hours compared to 04/07/2015 FINDINGS: Interval removal of BILATERAL thoracostomy tubes, mediastinal drains, and  Swan-Ganz catheter. RIGHT jugular central venous catheter tip projects over RIGHT internal jugular vein. Enlargement of cardiac silhouette. Atelectasis in the mid lungs bilaterally and greatest in LEFT lower lobe. No definite pleural effusion or pneumothorax. Bones unremarkable. IMPRESSION: Scattered atelectasis greatest in LEFT lower lobe. Electronically Signed   By: Lavonia Dana M.D.   On: 04/08/2015 07:31    Assessment/Plan: S/P Procedure(s) (LRB): CORONARY ARTERY BYPASS GRAFTING (CABG) x three,  using bilateral mammaries, and right leg greater saphenous vein harvested endoscopically (N/A) TRANSESOPHAGEAL ECHOCARDIOGRAM (TEE) (N/A)   1 doing ok 2 add duonebs, on singulair for bronchitis 3 culture sputum 4 push pulm toilet/rehab 5 cont gentle diuresis 6 H/H down some- monitor.  Leukocytosis improving- no fevers  LOS: 3 days    GOLD,WAYNE E 04/09/2015   Chart reviewed, patient examined, agree with above. He feels much better this evening. Ambulated a lot today. Eating well.

## 2015-04-09 NOTE — Progress Notes (Signed)
CARDIAC REHAB PHASE I   PRE:  Rate/Rhythm: 78 SR    BP: sitting 130/75    SaO2: 96 4L, 95 2L, 89 RA  MODE:  Ambulation: 550 ft   POST:  Rate/Rhythm: 96 SR    BP: sitting 117/75     SaO2: 89 2L while walking, 90-92 3L after walk  Pt had significant pain earlier. Feeling better after pain meds and bathing. Pt able to stand and walk with RW and 3L (began on 2L but needed increase). No c/o, felt good walking. Pt needs 3L walking and 2L at rest. Return to recliner. Sts he could have walked farther. Encouraged IS. Pt wants to walk more today, he can with his wife and O2. [phone]   [place], ACSM 04/09/2015 11:11 AM

## 2015-04-09 NOTE — Progress Notes (Signed)
Patient ambulated in hallway with wife, will monitor patient. Rubin Dais, Bettina Gavia RN

## 2015-04-09 NOTE — Progress Notes (Signed)
Patient ambulated in hallway with wife, back in chair, will monitor patient. Nikya Busler, Bettina Gavia RN

## 2015-04-10 MED ORDER — POTASSIUM CHLORIDE CRYS ER 20 MEQ PO TBCR
40.0000 meq | EXTENDED_RELEASE_TABLET | Freq: Once | ORAL | Status: AC
  Administered 2015-04-10: 40 meq via ORAL
  Filled 2015-04-10: qty 2

## 2015-04-10 MED ORDER — AMOXICILLIN-POT CLAVULANATE 875-125 MG PO TABS
1.0000 | ORAL_TABLET | Freq: Two times a day (BID) | ORAL | Status: DC
  Administered 2015-04-10 – 2015-04-12 (×5): 1 via ORAL
  Filled 2015-04-10 (×5): qty 1

## 2015-04-10 NOTE — Progress Notes (Addendum)
[address]       [place] [address]             [phone]      4 Days Post-Op Procedure(s) (LRB): CORONARY ARTERY BYPASS GRAFTING (CABG) x three,  using bilateral mammaries, and right leg greater saphenous vein harvested endoscopically (N/A) TRANSESOPHAGEAL ECHOCARDIOGRAM (TEE) (N/A) Subjective: Feeling better, breathing is more comfortable but still coughing/producing sputum- cx is pending  Objective: Vital signs in last 24 hours: Temp:  [98.1 F (36.7 C)-99.2 F (37.3 C)] 98.2 F (36.8 C) (03/07 0557) Pulse Rate:  [73-82] 76 (03/07 0557) Cardiac Rhythm:  [-] Normal sinus rhythm (03/06 1900) Resp:  [18] 18 (03/07 0557) BP: (122-141)/(57-75) 140/75 mmHg (03/07 0557) SpO2:  [92 %-97 %] 96 % (03/07 0557) Weight:  [265 lb 1.6 oz (120.249 kg)] 265 lb 1.6 oz (120.249 kg) (03/07 0557)  Hemodynamic parameters for last 24 hours:    Intake/Output from previous day: 03/06 0701 - 03/07 0700 In: 360 [P.O.:360] Out: 1650 [Urine:1650] Intake/Output this shift:    General appearance: alert, cooperative and no distress Heart: regular rate and rhythm Lungs: dim in bases Abdomen: benign Extremities: minor edema Wound: incis healing well  Lab Results:  Recent Labs  04/07/15 1730 04/07/15 1740 04/08/15 0410  WBC 20.7*  --  16.7*  HGB 12.3* 12.9* 10.8*  HCT 37.0* 38.0* 32.8*  PLT 240  --  171   BMET:  Recent Labs  04/07/15 1740 04/08/15 0410  NA 141 139  K 4.2 3.8  CL 102 105  CO2  --  27  GLUCOSE 136* 110*  BUN 21* 18  CREATININE 0.90 0.83  CALCIUM  --  8.3*    PT/INR: No results for input(s): LABPROT, INR in the last 72 hours. ABG    Component Value Date/Time   PHART 7.311* 04/06/2015 1902   HCO3 22.2 04/06/2015 1902   TCO2 27 04/07/2015 1740   ACIDBASEDEF 4.0* 04/06/2015 1902   O2SAT 89.0 04/06/2015 1902   CBG (last 3)   Recent Labs  04/07/15 1925 04/07/15 2326 04/08/15 0358  GLUCAP 143* 97 86   Results for orders  placed or performed during the hospital encounter of 04/06/15  Culture, expectorated sputum-assessment     Status: None   Collection Time: 04/09/15 10:04 AM  Result Value Ref Range Status   Specimen Description SPUTUM  Final   Special Requests NONE  Final   Sputum evaluation   Final    THIS SPECIMEN IS ACCEPTABLE. RESPIRATORY CULTURE REPORT TO FOLLOW.   Report Status 04/09/2015 FINAL  Final    Meds Scheduled Meds: . aspirin EC  325 mg Oral Daily  . docusate sodium  200 mg Oral Daily  . enoxaparin (LOVENOX) injection  40 mg Subcutaneous QHS  . famotidine  20 mg Oral BID  . fluticasone  2 spray Each Nare Daily  . furosemide  40 mg Oral Daily  . guaiFENesin  600 mg Oral BID  . ipratropium-albuterol  3 mL Nebulization QID  . metoprolol tartrate  25 mg Oral BID  . montelukast  10 mg Oral QHS  . omega-3 acid ethyl esters  1 capsule Oral BID  . potassium chloride  20 mEq Oral BID  . rosuvastatin  10 mg Oral Daily  . sodium chloride flush  3 mL Intravenous Q12H   Continuous Infusions:  PRN Meds:.sodium chloride, allopurinol, alum & mag hydroxide-simeth, bisacodyl **OR** bisacodyl, calcium carbonate, ketorolac, ondansetron **OR** ondansetron (ZOFRAN) IV, oxyCODONE,  sodium chloride flush  Xrays No results found.  Assessment/Plan: S/P Procedure(s) (LRB): CORONARY ARTERY BYPASS GRAFTING (CABG) x three,  using bilateral mammaries, and right leg greater saphenous vein harvested endoscopically (N/A) TRANSESOPHAGEAL ECHOCARDIOGRAM (TEE) (N/A)  1 improving pulm status, cont current rx, wean O2 2 cont gentle diuresis 3 recheck labs in am 4 home prob in 1-2 days   LOS: 4 days    GOLD,WAYNE E 04/10/2015   Chart reviewed, patient examined, agree with above. Sputum culture pending but gram stain has abundant WBC with moderate G+ cocci in pairs and chains as well as a few G- rods. Started on Augmentin this am empirically pending culture results.

## 2015-04-10 NOTE — Progress Notes (Signed)
CARDIAC REHAB PHASE I   PRE:  Rate/Rhythm: 72 SR    BP: sitting 144/82    SaO2: 95 2L  MODE:  Ambulation: 690 ft   POST:  Rate/Rhythm: 90 SR    BP: sitting 160/75     SaO2: 90 RA  Pt ambulated without O2. SaO2 down to 86-89 RA at times but he could increase SaO2 with rest and pursed lip breathing. Pt talked entire walk and denied SOB. Pt sts he feels well and thankful for walking. Did not use RW, steady gait. Slight LOB on a turn x1. Return to recliner. Has been walking with wife. [place], ACSM 04/10/2015 2:19 PM

## 2015-04-11 ENCOUNTER — Inpatient Hospital Stay (HOSPITAL_COMMUNITY): Payer: BLUE CROSS/BLUE SHIELD

## 2015-04-11 LAB — CBC WITH DIFFERENTIAL/PLATELET
BASOS PCT: 0 %
Basophils Absolute: 0 10*3/uL (ref 0.0–0.1)
EOS ABS: 0.6 10*3/uL (ref 0.0–0.7)
Eosinophils Relative: 6 %
HEMATOCRIT: 32.7 % — AB (ref 39.0–52.0)
HEMOGLOBIN: 11.1 g/dL — AB (ref 13.0–17.0)
Lymphocytes Relative: 23 %
Lymphs Abs: 2.2 10*3/uL (ref 0.7–4.0)
MCH: 30.8 pg (ref 26.0–34.0)
MCHC: 33.9 g/dL (ref 30.0–36.0)
MCV: 90.8 fL (ref 78.0–100.0)
Monocytes Absolute: 1.4 10*3/uL — ABNORMAL HIGH (ref 0.1–1.0)
Monocytes Relative: 15 %
NEUTROS ABS: 5.5 10*3/uL (ref 1.7–7.7)
NEUTROS PCT: 56 %
Platelets: 240 10*3/uL (ref 150–400)
RBC: 3.6 MIL/uL — AB (ref 4.22–5.81)
RDW: 12.8 % (ref 11.5–15.5)
WBC: 9.7 10*3/uL (ref 4.0–10.5)

## 2015-04-11 LAB — BASIC METABOLIC PANEL
ANION GAP: 8 (ref 5–15)
BUN: 13 mg/dL (ref 6–20)
CHLORIDE: 107 mmol/L (ref 101–111)
CO2: 26 mmol/L (ref 22–32)
CREATININE: 0.85 mg/dL (ref 0.61–1.24)
Calcium: 8.9 mg/dL (ref 8.9–10.3)
GFR calc non Af Amer: >60 mL/min (ref >60)
Glucose, Bld: 103 mg/dL — ABNORMAL HIGH (ref 65–99)
POTASSIUM: 4.3 mmol/L (ref 3.5–5.1)
SODIUM: 141 mmol/L (ref 135–145)

## 2015-04-11 MED ORDER — IPRATROPIUM-ALBUTEROL 0.5-2.5 (3) MG/3ML IN SOLN: 3.0000 mL | RESPIRATORY_TRACT | Status: DC | PRN

## 2015-04-11 MED ORDER — HYDROCOD POLST-CPM POLST ER 10-8 MG/5ML PO SUER
5.0000 mL | Freq: Two times a day (BID) | ORAL | Status: DC | PRN
  Administered 2015-04-11: 5 mL via ORAL
  Filled 2015-04-11: qty 5

## 2015-04-11 MED ORDER — POTASSIUM CHLORIDE CRYS ER 20 MEQ PO TBCR
20.0000 meq | EXTENDED_RELEASE_TABLET | Freq: Once | ORAL | Status: AC
  Administered 2015-04-11: 20 meq via ORAL
  Filled 2015-04-11: qty 1

## 2015-04-11 MED ORDER — LISINOPRIL 10 MG PO TABS
10.0000 mg | ORAL_TABLET | Freq: Every day | ORAL | Status: DC
  Administered 2015-04-11 – 2015-04-12 (×2): 10 mg via ORAL
  Filled 2015-04-11 (×2): qty 1

## 2015-04-11 MED ORDER — FUROSEMIDE 40 MG PO TABS
40.0000 mg | ORAL_TABLET | Freq: Every day | ORAL | Status: DC
  Administered 2015-04-11 – 2015-04-12 (×2): 40 mg via ORAL
  Filled 2015-04-11 (×2): qty 1

## 2015-04-11 NOTE — Progress Notes (Signed)
CARDIAC REHAB PHASE I   PRE:  Rate/Rhythm: 83 SR    BP: sitting 144/74    SaO2: 95 RA  MODE:  Ambulation: 690 ft   POST:  Rate/Rhythm: 88 SR    BP:      SaO2: 91-93 RA  Pt doing well. SaO2 improved today, no low SaO2s with walking noted. Sts he walked 25 min earlier with RW. Likes RW for pace and endurance however I don't think he will need one for home (has walked without it). Discussed CRPII and will send referral to [facility]. Will f/u in am for rest of education. [id number]   [place], ACSM 04/11/2015 10:05 AM

## 2015-04-11 NOTE — Anesthesia Postprocedure Evaluation (Signed)
Anesthesia Post Note  Patient: TAVARRIS MCPARTLIN  Procedure(s) Performed: Procedure(s) (LRB): CORONARY ARTERY BYPASS GRAFTING (CABG) x three,  using bilateral mammaries, and right leg greater saphenous vein harvested endoscopically (N/A) TRANSESOPHAGEAL ECHOCARDIOGRAM (TEE) (N/A)  Patient location during evaluation: SICU Anesthesia Type: General Level of consciousness: patient remains intubated per anesthesia plan Pain management: pain level controlled Vital Signs Assessment: post-procedure vital signs reviewed and stable Respiratory status: patient remains intubated per anesthesia plan Cardiovascular status: stable Anesthetic complications: no    Last Vitals:  Filed Vitals:   04/11/15 0605 04/11/15 1000  BP: 136/71 141/74  Pulse: 65 78  Temp: 36.8 C   Resp: 18     Last Pain:  Filed Vitals:   04/11/15 1000  PainSc: Asleep                 EDWARDS,Milayah Krell

## 2015-04-11 NOTE — Progress Notes (Signed)
[address]       [place] [address]             [phone]      5 Days Post-Op Procedure(s) (LRB): CORONARY ARTERY BYPASS GRAFTING (CABG) x three,  using bilateral mammaries, and right leg greater saphenous vein harvested endoscopically (N/A) TRANSESOPHAGEAL ECHOCARDIOGRAM (TEE) (N/A) Subjective: Still feels uncomfortable from cough/bronchitis- but better  Objective: Vital signs in last 24 hours: Temp:  [98.3 F (36.8 C)-98.4 F (36.9 C)] 98.3 F (36.8 C) (03/08 0605) Pulse Rate:  [65-73] 65 (03/08 0605) Cardiac Rhythm:  [-] Normal sinus rhythm (03/07 1900) Resp:  [16-22] 18 (03/08 0605) BP: (128-139)/(68-73) 136/71 mmHg (03/08 0605) SpO2:  [94 %-100 %] 95 % (03/08 0605) Weight:  [262 lb 9.1 oz (119.1 kg)] 262 lb 9.1 oz (119.1 kg) (03/08 0302)  Hemodynamic parameters for last 24 hours:    Intake/Output from previous day: 03/07 0701 - 03/08 0700 In: 480 [P.O.:480] Out: 300 [Urine:300] Intake/Output this shift:    General appearance: alert, cooperative and no distress Heart: regular rate and rhythm Lungs: dim with some basilar crackles Abdomen: benign Extremities: + edema Wound: incis healing well  Lab Results:  Recent Labs  04/11/15 0255  WBC 9.7  HGB 11.1*  HCT 32.7*  PLT 240   BMET:  Recent Labs  04/11/15 0255  NA 141  K 4.3  CL 107  CO2 26  GLUCOSE 103*  BUN 13  CREATININE 0.85  CALCIUM 8.9    PT/INR: No results for input(s): LABPROT, INR in the last 72 hours. ABG    Component Value Date/Time   PHART 7.311* 04/06/2015 1902   HCO3 22.2 04/06/2015 1902   TCO2 27 04/07/2015 1740   ACIDBASEDEF 4.0* 04/06/2015 1902   O2SAT 89.0 04/06/2015 1902   Results for orders placed or performed during the hospital encounter of 04/06/15  Culture, expectorated sputum-assessment     Status: None   Collection Time: 04/09/15 10:04 AM  Result Value Ref Range Status   Specimen Description SPUTUM  Final   Special Requests NONE   Final   Sputum evaluation   Final    THIS SPECIMEN IS ACCEPTABLE. RESPIRATORY CULTURE REPORT TO FOLLOW.   Report Status 04/09/2015 FINAL  Final  Culture, respiratory (NON-Expectorated)     Status: None (Preliminary result)   Collection Time: 04/09/15 10:04 AM  Result Value Ref Range Status   Specimen Description SPUTUM  Final   Special Requests NONE  Final   Gram Stain   Final    ABUNDANT WBC PRESENT, PREDOMINANTLY PMN FEW SQUAMOUS EPITHELIAL CELLS PRESENT MODERATE GRAM POSITIVE COCCI IN PAIRS IN CHAINS FEW GRAM NEGATIVE RODS THIS SPECIMEN IS ACCEPTABLE FOR SPUTUM CULTURE Performed at [facility]    Culture PENDING  Incomplete   Report Status PENDING  Incomplete     CBG (last 3)  No results for input(s): GLUCAP in the last 72 hours.  Meds Scheduled Meds: . amoxicillin-clavulanate  1 tablet Oral Q12H  . aspirin EC  325 mg Oral Daily  . docusate sodium  200 mg Oral Daily  . enoxaparin (LOVENOX) injection  40 mg Subcutaneous QHS  . famotidine  20 mg Oral BID  . fluticasone  2 spray Each Nare Daily  . guaiFENesin  600 mg Oral BID  . ipratropium-albuterol  3 mL Nebulization QID  . metoprolol tartrate  25 mg Oral BID  . montelukast  10 mg Oral QHS  . omega-3 acid ethyl  esters  1 capsule Oral BID  . rosuvastatin  10 mg Oral Daily  . sodium chloride flush  3 mL Intravenous Q12H   Continuous Infusions:  PRN Meds:.sodium chloride, allopurinol, alum & mag hydroxide-simeth, bisacodyl **OR** bisacodyl, calcium carbonate, ketorolac, ondansetron **OR** ondansetron (ZOFRAN) IV, oxyCODONE, sodium chloride flush  Xrays No results found.  Assessment/Plan: S/P Procedure(s) (LRB): CORONARY ARTERY BYPASS GRAFTING (CABG) x three,  using bilateral mammaries, and right leg greater saphenous vein harvested endoscopically (N/A) TRANSESOPHAGEAL ECHOCARDIOGRAM (TEE) (N/A)   1 steady progress 2 conts augmentin/ nebs- check CXR to make sure he hasn't developed pneumonia 3 cont to  diurese 4 d/c epw's 5 poss d/c in am  LOS: 5 days    Leonard Edwards E 04/11/2015

## 2015-04-11 NOTE — Discharge Summary (Signed)
Physician Discharge Summary  Patient ID: Leonard Edwards MRN: [id number] DOB/AGE: 05-29-1955 60 y.o.  Admit date: 04/06/2015 Discharge date: 04/12/2015  Admission Diagnoses: CAD  Discharge Diagnoses:  Active Problems:   CAD (coronary artery disease)  Patient Active Problem List   Diagnosis Date Noted  . CAD (coronary artery disease) 04/06/2015  . Abnormal nuclear cardiac imaging test 03/29/2015  . TIA 10/16/2009  . SINUSITIS, CHRONIC 10/10/2009  . TESTICULAR HYPOFUNCTION 09/05/2009  . HYPERLIPIDEMIA 12/01/2007  . GASTRIC POLYP 08/27/2007  . ESOPHAGEAL STRICTURE 08/27/2007  . HIATAL HERNIA 08/27/2007  . GOUT 07/07/2007  . CORONARY ARTERY DISEASE 07/07/2007  . HYPERTENSION 09/16/2006  . GERD 09/16/2006  . BENIGN PROSTATIC HYPERTROPHY 09/16/2006  . FIBROMYALGIA 09/16/2006      HPI:  The patient is a 60 year old gentleman with hypertension, hyperlipidemia, and known coronary artery disease s/p DES to the distal RCA in 09/2004 and DES to a large Ramus in 10/2009. He was recently trying to get back to the gym and has noticed exertional fatigue and some chest tightness. He has been getting tired walking around his neighborhood. He occasionally forgets to take his medications and then has noticed more chest discomfort with exertion. He had a recent Myoview that showed an EF of 43% with a small defect of mild severity present in the basal anterolateral location. The defect is non-reversible. There was also a large defect of moderate severity present in the basal anteroseptal, basal inferoseptal, mid anteroseptal, mid inferoseptal, apical septal and apex location. The defect is partially reversible and is consistent with infarct with peri- infarct ischemia. Cardiac cath showed an 80% distal LM stenosis. The ostial and proximal LAD had a 90% stenosis and there was 50% distal LAD stenosis. The RCA has an 80% ostial PDA stenosis. The LVEF was felt to be normal. The Ramus is large with a patent stent.  The LCX is a relatively small caliber system with 60% proximal and 70% mid stenoses.   He was electively admitted this hospitalization for the procedure.   Discharged Condition: good  Hospital Course: The patient was admitted electively and taken to the operating room where he underwent the below described procedure. He tolerated it well and was taken to the surgical intensive care unit in stable condition. He has remained hemodynamically stable. He has had no significant postoperative cardiac dysrhythmias. He was weaned from the ventilator using standard protocols. All routine lines, monitors and drainage devices have been discontinued in the standard fashion. He did develop postoperative bronchitis and has required aggressive pulmonary toilet as well as nebulizers. He has also been placed on oral Augmentin. He is showing a steady improvement in this regard. Incisions are noted to be healing well without evidence of infection. He is tolerating diet. Oxygen is currently being weaned and he is maintaining good saturations on 2 L at this time. Currently he is felt to be stable for discharge. Consults: None  Significant Diagnostic Studies: routine post op CXR/labs   CARDIOVASCULAR SURGERY OPERATIVE NOTE  04/06/2015  Surgeon: Gaye Pollack, MD  First Assistant: Lars Pinks, PA-C   Preoperative Diagnosis: Severe Left main and multi-vessel coronary artery disease   Postoperative Diagnosis: Same   Procedure:  1. Median Sternotomy 2. Extracorporeal circulation 3. Coronary artery bypass grafting x 3   Left internal mammary artery graft to the LAD  Free right internal mammary artery graft to the Ramus  SVG to PDA  4. Endoscopic vein harvest from the right leg   Anesthesia: General  Endotracheal Treatments: surgery:    Discharge Exam: Blood pressure 119/67, pulse 61,  temperature 99 F (37.2 C), temperature source Oral, resp. rate 21, height 5\' 10"  (1.778 m), weight 261 lb 6.4 oz (118.57 kg), SpO2 94 %.  General appearance: alert, cooperative and no distress Heart: regular rate and rhythm Lungs: mildly dim in bases Abdomen: benign Extremities: minor edema Wound: incis healing well  Disposition: 01-Home or Self Care      Discharge Instructions    Amb Referral to Cardiac Rehabilitation    Complete by:  As directed   Diagnosis:  CABG            Medication List    STOP taking these medications        aspirin 81 MG tablet  Replaced by:  aspirin 325 MG EC tablet     hydrochlorothiazide 25 MG tablet  Commonly known as:  HYDRODIURIL     nitroGLYCERIN 0.4 MG SL tablet  Commonly known as:  NITROSTAT      TAKE these medications        allopurinol 300 MG tablet  Commonly known as:  ZYLOPRIM  Take 300 mg by mouth daily as needed (for gout flare ups).     amLODipine 10 MG tablet  Commonly known as:  NORVASC  Take 1 tablet (10 mg total) by mouth daily.     amoxicillin-clavulanate 875-125 MG tablet  Commonly known as:  AUGMENTIN  Take 1 tablet by mouth every 12 (twelve) hours.     ANDROGEL PUMP 20.25 MG/ACT (1.62%) Gel  Generic drug:  Testosterone  Apply 1 application topically daily.     aspirin 325 MG EC tablet  Take 1 tablet (325 mg total) by mouth daily.     chlorpheniramine-HYDROcodone 10-8 MG/5ML Suer  Commonly known as:  TUSSIONEX  Take 5 mLs by mouth every 12 (twelve) hours as needed for cough.     CIALIS 20 MG tablet  Generic drug:  tadalafil  TAKE ONE TABLET DAILY AS NEEDED FOR     ERECTILE DYSFUNCTION     Co Q 10 100 MG Caps  Take 100 mg by mouth daily.     fluticasone 50 MCG/ACT nasal spray  Commonly known as:  FLONASE  TWO SPRAYS DAILY IN NOSE     furosemide 40 MG tablet  Commonly known as:  LASIX  Take 1 tablet (40 mg total) by mouth daily.     lisinopril 10 MG tablet  Commonly known as:  PRINIVIL,ZESTRIL   Take 1 tablet (10 mg total) by mouth daily.     LOVAZA 1 g capsule  Generic drug:  omega-3 acid ethyl esters  TAKE ONE CAPSULE TWICE A DAY     metoprolol succinate 50 MG 24 hr tablet  Commonly known as:  TOPROL-XL  Take 1 tablet (50 mg total) by mouth 2 (two) times daily.     montelukast 10 MG tablet  Commonly known as:  SINGULAIR  Take 10 mg by mouth at bedtime.     omeprazole 40 MG capsule  Commonly known as:  PRILOSEC  TAKE ONE CAPSULE EVERY MORNING     oxyCODONE 5 MG immediate release tablet  Commonly known as:  Oxy IR/ROXICODONE  Take 1-2 tablets (5-10 mg total) by mouth every 6 (six) hours as needed for severe pain.     potassium chloride 10 MEQ tablet  Commonly known as:  K-DUR,KLOR-CON  Take 1 tablet (10 mEq total) by mouth daily.     rosuvastatin 10 MG  tablet  Commonly known as:  CRESTOR  Take 1 tablet (10 mg total) by mouth daily.     Vitamin D3 1000 units Caps  Take 1,000 Units by mouth daily.       Follow-up Information    Follow up with Gaye Pollack, MD.   Specialty:  Cardiothoracic Surgery   Why:  05/09/2015 at 9:30 AM to see the surgeon. Please obtain a chest x-ray had 9 AM and [facility]. [facility] is located in the same office complex.   Contact information:   [address] [place] [address] [phone]       Follow up with [facility] [place].   Specialty:  Cardiothoracic Surgery   Why:  04/18/2015 at 10 AM, appointment to see the nurse for suture removal.   Contact information:   [place], [place] [phone]      Follow up with [name], NP.   Specialties:  Nurse Practitioner, Interventional Cardiology, Cardiology, Radiology   Why:  04/25/15 at 11:30 for cardiology follow-up   Contact information:   [place] [place] [address] [phone]      The patient has been discharged  on:   1.Beta Blocker:  Yes [  y ]                              No   [   ]                              If No, reason:  2.Ace Inhibitor/ARB: Yes [  y ]                                     No  [    ]                                     If No, reason:  3.Statin:   Yes [ y  ]                  No  [   ]                  If No, reason:  4.Ecasa:  Yes  [ y  ]                  No   [   ]                  If No, reason:  Signed: GOLD,WAYNE E 04/12/2015, 12:21 PM

## 2015-04-11 NOTE — Progress Notes (Signed)
RT protocol assessment completed, score 5. Based on the assessment and conversation with the patient (he does not feel he needs them), the Duoneb treatments have been changed to Q4 PRN.

## 2015-04-11 NOTE — Progress Notes (Signed)
Wires removed no resistance, patient tolerated well, no bleeding BP stable. Shirley Muscat 11:36 AM

## 2015-04-11 NOTE — Discharge Instructions (Signed)
Endoscopic Saphenous Vein Harvesting, Care After °Refer to this sheet in the next few weeks. These instructions provide you with information on caring for yourself after your procedure. Your health care provider may also give you more specific instructions. Your treatment has been planned according to current medical practices, but problems sometimes occur. Call your health care provider if you have any problems or questions after your procedure. °HOME CARE INSTRUCTIONS °Medicine °· Take whatever pain medicine your surgeon prescribes. Follow the directions carefully. Do not take over-the-counter pain medicine unless your surgeon says it is okay. Some pain medicine can cause bleeding problems for several weeks after surgery. °· Follow your surgeon's instructions about driving. You will probably not be permitted to drive after heart surgery. °· Take any medicines your surgeon prescribes. Any medicines you took before your heart surgery should be checked with your health care provider before you start taking them again. °Wound care °· If your surgeon has prescribed an elastic bandage or stocking, ask how long you should wear it. °· Check the area around your surgical cuts (incisions) whenever your bandages (dressings) are changed. Look for any redness or swelling. °· You will need to return to have the stitches (sutures) or staples taken out. Ask your surgeon when to do that. °· Ask your surgeon when you can shower or bathe. °Activity °· Try to keep your legs raised when you are sitting. °· Do any exercises your health care providers have given you. These may include deep breathing exercises, coughing, walking, or other exercises. °SEEK MEDICAL CARE IF: °· You have any questions about your medicines. °· You have more leg pain, especially if your pain medicine stops working. °· New or growing bruises develop on your leg. °· Your leg swells, feels tight, or becomes red. °· You have numbness in your leg. °SEEK IMMEDIATE  MEDICAL CARE IF: °· Your pain gets much worse. °· Blood or fluid leaks from any of the incisions. °· Your incisions become warm, swollen, or red. °· You have chest pain. °· You have trouble breathing. °· You have a fever. °· You have more pain near your leg incision. °MAKE SURE YOU: °· Understand these instructions. °· Will watch your condition. °· Will get help right away if you are not doing well or get worse. °  °This information is not intended to replace advice given to you by your health care provider. Make sure you discuss any questions you have with your health care provider. °  °Document Released: 10/02/2010 Document Revised: 02/10/2014 Document Reviewed: 10/02/2010 °Elsevier Interactive Patient Education ©2016 [employer] °Coronary Artery Bypass Grafting, Care After °These instructions give you information on caring for yourself after your procedure. Your doctor may also give you more specific instructions. Call your doctor if you have any problems or questions after your procedure.  °HOME CARE °· Only take medicine as told by your doctor. Take medicines exactly as told. Do not stop taking medicines or start any new medicines without talking to your doctor first. °· Take your pulse as told by your doctor. °· Do deep breathing as told by your doctor. Use your breathing device (incentive spirometer), if given, to practice deep breathing several times a day. Support your chest with a pillow or your arms when you take deep breaths or cough. °· Keep the area clean, dry, and protected where the surgery cuts (incisions) were made. Remove bandages (dressings) only as told by your doctor. If strips were applied to surgical area, do not take   them off. They fall off on their own. °· Check the surgery area daily for puffiness (swelling), redness, or leaking fluid. °· If surgery cuts were made in your legs: °· Avoid crossing your legs. °· Avoid sitting for long periods of time. Change positions every 30  minutes. °· Raise your legs when you are sitting. Place them on pillows. °· Wear stockings that help keep blood clots from forming in your legs (compression stockings). °· Only take sponge baths until your doctor says it is okay to take showers. Pat the surgery area dry. Do not rub the surgery area with a washcloth or towel. Do not bathe, swim, or use a hot tub until your doctor says it is okay. °· Eat foods that are high in fiber. These include raw fruits and vegetables, whole grains, beans, and nuts. Choose lean meats. Avoid canned, processed, and fried foods. °· Drink enough fluids to keep your pee (urine) clear or pale yellow. °· Weigh yourself every day. °· Rest and limit activity as told by your doctor. You may be told to: °· Stop any activity if you have chest pain, shortness of breath, changes in heartbeat, or dizziness. Get help right away if this happens. °· Move around often for short amounts of time or take short walks as told by your doctor. Gradually become more active. You may need help to strengthen your muscles and build endurance. °· Avoid lifting, pushing, or pulling anything heavier than 10 pounds (4.5 kg) for at least 6 weeks after surgery. °· Do not drive until your doctor says it is okay. °· Ask your doctor when you can go back to work. °· Ask your doctor when you can begin sexual activity again. °· Follow up with your doctor as told. °GET HELP IF: °· You have puffiness, redness, more pain, or fluid draining from the incision site. °· You have a fever. °· You have puffiness in your ankles or legs. °· You have pain in your legs. °· You gain 2 or more pounds (0.9 kg) a day. °· You feel sick to your stomach (nauseous) or throw up (vomit). °· You have watery poop (diarrhea). °GET HELP RIGHT AWAY IF: °· You have chest pain that goes to your jaw or arms. °· You have shortness of breath. °· You have a fast or irregular heartbeat. °· You notice a "clicking" in your breastbone when you move. °· You  have numbness or weakness in your arms or legs. °· You feel dizzy or light-headed. °MAKE SURE YOU: °· Understand these instructions. °· Will watch your condition. °· Will get help right away if you are not doing well or get worse. °  °This information is not intended to replace advice given to you by your health care provider. Make sure you discuss any questions you have with your health care provider. °  °Document Released: 01/25/2013 Document Reviewed: 01/25/2013 °Elsevier Interactive Patient Education ©2016 Elsevier Inc. ° °

## 2015-04-12 LAB — CULTURE, RESPIRATORY W GRAM STAIN

## 2015-04-12 LAB — CULTURE, RESPIRATORY: CULTURE: NORMAL

## 2015-04-12 MED ORDER — HYDROCOD POLST-CPM POLST ER 10-8 MG/5ML PO SUER: 5.0000 mL | Freq: Two times a day (BID) | ORAL | Status: DC | PRN

## 2015-04-12 MED ORDER — LISINOPRIL 10 MG PO TABS: 10.0000 mg | ORAL_TABLET | Freq: Every day | ORAL | Status: DC

## 2015-04-12 MED ORDER — FUROSEMIDE 40 MG PO TABS: 40.0000 mg | ORAL_TABLET | Freq: Every day | ORAL | Status: DC

## 2015-04-12 MED ORDER — POTASSIUM CHLORIDE CRYS ER 10 MEQ PO TBCR
10.0000 meq | EXTENDED_RELEASE_TABLET | Freq: Every day | ORAL | Status: DC
Start: 2015-04-12 — End: 2015-04-12
  Administered 2015-04-12: 10 meq via ORAL
  Filled 2015-04-12: qty 1

## 2015-04-12 MED ORDER — POTASSIUM CHLORIDE CRYS ER 10 MEQ PO TBCR: 10.0000 meq | EXTENDED_RELEASE_TABLET | Freq: Every day | ORAL | Status: DC

## 2015-04-12 MED ORDER — AMOXICILLIN-POT CLAVULANATE 875-125 MG PO TABS: 1.0000 | ORAL_TABLET | Freq: Two times a day (BID) | ORAL | Status: DC

## 2015-04-12 MED ORDER — OXYCODONE HCL 5 MG PO TABS: 5.0000 mg | ORAL_TABLET | Freq: Four times a day (QID) | ORAL | Status: DC | PRN

## 2015-04-12 MED ORDER — ASPIRIN 325 MG PO TBEC: 325.0000 mg | DELAYED_RELEASE_TABLET | Freq: Every day | ORAL | Status: DC

## 2015-04-12 NOTE — Progress Notes (Signed)
Ed completed with pt and wife. Voiced understanding.  [id number] [place], ACSM 10:19 AM 04/12/2015

## 2015-04-12 NOTE — Progress Notes (Addendum)
[address]       [place] [address]             [phone]      6 Days Post-Op Procedure(s) (LRB): CORONARY ARTERY BYPASS GRAFTING (CABG) x three,  using bilateral mammaries, and right leg greater saphenous vein harvested endoscopically (N/A) TRANSESOPHAGEAL ECHOCARDIOGRAM (TEE) (N/A) Subjective: Feels better, pulm status is improved  Objective: Vital signs in last 24 hours: Temp:  [97.4 F (36.3 C)-99 F (37.2 C)] 99 F (37.2 C) (03/09 0500) Pulse Rate:  [61-80] 61 (03/09 0500) Cardiac Rhythm:  [-] Normal sinus rhythm (03/08 1900) Resp:  [18-21] 21 (03/09 0500) BP: (109-156)/(67-74) 119/67 mmHg (03/09 0500) SpO2:  [93 %-98 %] 94 % (03/09 0500) Weight:  [261 lb 6.4 oz (118.57 kg)] 261 lb 6.4 oz (118.57 kg) (03/09 0500)  Hemodynamic parameters for last 24 hours:    Intake/Output from previous day: 03/08 0701 - 03/09 0700 In: 740 [P.O.:740] Out: 1751 [Urine:1750; Stool:1] Intake/Output this shift:    General appearance: alert, cooperative and no distress Heart: regular rate and rhythm Lungs: mildly dim in bases Abdomen: benign Extremities: minor edema Wound: incis healing well  Lab Results:  Recent Labs  04/11/15 0255  WBC 9.7  HGB 11.1*  HCT 32.7*  PLT 240   BMET:  Recent Labs  04/11/15 0255  NA 141  K 4.3  CL 107  CO2 26  GLUCOSE 103*  BUN 13  CREATININE 0.85  CALCIUM 8.9    PT/INR: No results for input(s): LABPROT, INR in the last 72 hours. ABG    Component Value Date/Time   PHART 7.311* 04/06/2015 1902   HCO3 22.2 04/06/2015 1902   TCO2 27 04/07/2015 1740   ACIDBASEDEF 4.0* 04/06/2015 1902   O2SAT 89.0 04/06/2015 1902   CBG (last 3)  No results for input(s): GLUCAP in the last 72 hours.  Meds Scheduled Meds: . amoxicillin-clavulanate  1 tablet Oral Q12H  . aspirin EC  325 mg Oral Daily  . docusate sodium  200 mg Oral Daily  . enoxaparin (LOVENOX) injection  40 mg Subcutaneous QHS  . famotidine  20 mg  Oral BID  . fluticasone  2 spray Each Nare Daily  . furosemide  40 mg Oral Daily  . guaiFENesin  600 mg Oral BID  . lisinopril  10 mg Oral Daily  . metoprolol tartrate  25 mg Oral BID  . montelukast  10 mg Oral QHS  . omega-3 acid ethyl esters  1 capsule Oral BID  . rosuvastatin  10 mg Oral Daily  . sodium chloride flush  3 mL Intravenous Q12H   Continuous Infusions:  PRN Meds:.sodium chloride, allopurinol, alum & mag hydroxide-simeth, bisacodyl **OR** bisacodyl, calcium carbonate, chlorpheniramine-HYDROcodone, ipratropium-albuterol, ketorolac, ondansetron **OR** ondansetron (ZOFRAN) IV, oxyCODONE, sodium chloride flush  Xrays Dg Chest 2 View  04/11/2015  CLINICAL DATA:  Bronchitis.  Recent CABG. EXAM: CHEST  2 VIEW COMPARISON:  04/08/2015 FINDINGS: Right jugular sheath has been removed. Sequelae of CABG are again identified. The cardiac silhouette remains enlarged. There is persistent patchy retrocardiac opacity in the left lower lobe. Minimal right basilar atelectasis is noted. No pleural effusion or pneumothorax is seen. IMPRESSION: Persistent left greater than right basilar opacities, likely atelectasis. Electronically Signed   By: Logan Bores M.D.   On: 04/11/2015 10:34    Assessment/Plan: S/P Procedure(s) (LRB): CORONARY ARTERY BYPASS GRAFTING (CABG) x three,  using bilateral mammaries, and right leg greater saphenous vein harvested endoscopically (  N/A) TRANSESOPHAGEAL ECHOCARDIOGRAM (TEE) (N/A)  1 conts to progress nicely 2 stable for d/c  LOS: 6 days    GOLD,WAYNE E 04/12/2015  He is doing well. Lungs are clear and sputum culture is only normal flora. I suspect he has bronchitis which he frequently gets. Will let him go home on Augmentin for a one week course. I need to see him in the office in a couple weeks with a CXR.

## 2015-04-12 NOTE — Care Management Note (Signed)
Case Management Note  Patient Details  Name: DYMOND EYE MRN: [id number] Date of Birth: 1955/10/25  Subjective/Objective:                    Action/Plan: Anticipate discharge home today. No further CM needs but will be available should additional discharge needs arise.   Expected Discharge Date:                  Expected Discharge Plan:  [place]  In-House Referral:     Discharge planning Services  CM Consult  Post Acute Care Choice:    Choice offered to:     DME Arranged:    DME Agency:     HH Arranged:    [place]:     Status of Service:  In process, will continue to follow  Medicare Important Message Given:    Date Medicare IM Given:    Medicare IM give by:    Date Additional Medicare IM Given:    Additional Medicare Important Message give by:     If discussed at [place] of Stay Meetings, dates discussed:  04/12/2015  Additional Comments:  Delrae Sawyers, RN 04/12/2015, 3:04 PM

## 2015-04-18 ENCOUNTER — Ambulatory Visit (INDEPENDENT_AMBULATORY_CARE_PROVIDER_SITE_OTHER): Payer: Self-pay | Admitting: *Deleted

## 2015-04-18 DIAGNOSIS — Z951 Presence of aortocoronary bypass graft: Secondary | ICD-10-CM

## 2015-04-18 DIAGNOSIS — Z4802 Encounter for removal of sutures: Secondary | ICD-10-CM

## 2015-04-18 DIAGNOSIS — I2510 Atherosclerotic heart disease of native coronary artery without angina pectoris: Secondary | ICD-10-CM

## 2015-04-18 NOTE — Progress Notes (Signed)
Mr. Schnittker returns s/p CABG X 3. He is doing well at home. Diet and bowels are normal. The sternal incision and chest tube sites are well healed. Sutures were removed from the previous chest tube sites. His right leg EVH sites are healing well also.  He is using minimal pain medication, mostly at bedtime. There is no lower extremity edema and will no longer require LASIX/K. He will return as scheduled with a CXR.

## 2015-04-25 ENCOUNTER — Encounter: Payer: Self-pay | Admitting: Nurse Practitioner

## 2015-04-25 ENCOUNTER — Ambulatory Visit (INDEPENDENT_AMBULATORY_CARE_PROVIDER_SITE_OTHER): Payer: BLUE CROSS/BLUE SHIELD | Admitting: Nurse Practitioner

## 2015-04-25 VITALS — BP 116/68 | HR 64 | Ht 70.0 in | Wt 249.8 lb

## 2015-04-25 DIAGNOSIS — I2510 Atherosclerotic heart disease of native coronary artery without angina pectoris: Secondary | ICD-10-CM

## 2015-04-25 DIAGNOSIS — E785 Hyperlipidemia, unspecified: Secondary | ICD-10-CM | POA: Diagnosis not present

## 2015-04-25 DIAGNOSIS — I10 Essential (primary) hypertension: Secondary | ICD-10-CM

## 2015-04-25 DIAGNOSIS — Z951 Presence of aortocoronary bypass graft: Secondary | ICD-10-CM

## 2015-04-25 LAB — CBC
HCT: 40.6 % (ref 39.0–52.0)
Hemoglobin: 13.4 g/dL (ref 13.0–17.0)
MCH: 29 pg (ref 26.0–34.0)
MCHC: 33 g/dL (ref 30.0–36.0)
MCV: 87.9 fL (ref 78.0–100.0)
MPV: 9.9 fL (ref 8.6–12.4)
Platelets: 589 10*3/uL — ABNORMAL HIGH (ref 150–400)
RBC: 4.62 MIL/uL (ref 4.22–5.81)
RDW: 13.6 % (ref 11.5–15.5)
WBC: 9.2 10*3/uL (ref 4.0–10.5)

## 2015-04-25 LAB — BASIC METABOLIC PANEL
BUN: 12 mg/dL (ref 7–25)
CO2: 27 mmol/L (ref 20–31)
Calcium: 10.2 mg/dL (ref 8.6–10.3)
Chloride: 101 mmol/L (ref 98–110)
Creat: 0.82 mg/dL (ref 0.70–1.33)
Glucose, Bld: 98 mg/dL (ref 65–99)
Potassium: 4.8 mmol/L (ref 3.5–5.3)
Sodium: 140 mmol/L (ref 135–146)

## 2015-04-25 MED ORDER — OMEPRAZOLE 40 MG PO CPDR: 40.0000 mg | DELAYED_RELEASE_CAPSULE | Freq: Every day | ORAL | Status: DC | PRN

## 2015-04-25 NOTE — Progress Notes (Signed)
CARDIOLOGY OFFICE NOTE  Date:  04/25/2015    Leonard Edwards Date of Birth: May 23, 1955 Medical Record [id number]  PCP:  Marton Redwood, MD  Cardiologist:  Burt Knack    Chief Complaint  Patient presents with  . Post CABG    Seen for Dr. Burt Knack    History of Present Illness: Leonard Edwards is a 60 y.o. male who presents today for a post op visit. Seen for Dr. Burt Knack.   He has a history of CAD and hypertension. He initially presented several years ago with acute coronary syndrome and underwent PCI to right coronary artery in 2006. He developed recurrent anginal symptoms and underwent stenting of the ramus intermedius branch in 2011.  I have seen him several times over the past year or so - he has recently had more chest pain - updated his Myoview which turned out abnormal and he was referred for cardiac cath - this showed an 80% distal LM stenosis. The ostial and proximal LAD had a 90% stenosis and there was 50% distal LAD stenosis. The RCA has an 80% ostial PDA stenosis. The LVEF was felt to be normal. The Ramus was large with a patent stent. The LCX is a relatively small caliber system with 60% proximal and 70% mid stenoses. He was then referred on for CABG with Dr. Cyndia Bent. He had LIMA to LAD, free RIMA to ramus and SVG to PD.   Post op course complicated by bronchitis and he required antibiotics.   Comes back today. Here with his wife today. He is doing well. Already back working for a few hours a day. Walking at the gym - about 40 to 45 minutes over the past day or so. Using his narcotic mainly to sleep - taking 4 pills at night. Bowels ok. Weight is down 20 pounds. Not short of breath. Not driving. He feels like he is doing ok. To start rehab next week.    Past Medical History  Diagnosis Date  . CAD (coronary artery disease)     DES to distal RCA in 09/2004; DES to Ramus Intermediate 10/2009 following abnormal stress test  . Hyperlipidemia   . Hypertension   . GERD  (gastroesophageal reflux disease)   . Hiatal hernia   . Gastric polyp   . Esophageal stricture   . Gout   . Benign prostatic hypertrophy   . Obesity   . Fibromyalgia   . Pneumonia     hx of    Past Surgical History  Procedure Laterality Date  . Des to distal rca in 09/2004; des to ramus intermediate 10/2009 following abnormal stress test  2006, 2011  . Cardiac catheterization N/A 03/29/2015    Procedure: Left Heart Cath and Coronary Angiography;  Surgeon: Sherren Mocha, MD;  Location: [facility];  Service: Cardiovascular;  Laterality: N/A;  . Ultrasound guidance for vascular access  03/29/2015    Procedure: Ultrasound Guidance For Vascular Access;  Surgeon: Sherren Mocha, MD;  Location: [facility];  Service: Cardiovascular;;  . Colonoscopy w/ polypectomy    . Coronary artery bypass graft N/A 04/06/2015    Procedure: CORONARY ARTERY BYPASS GRAFTING (CABG) x three,  using bilateral mammaries, and right leg greater saphenous vein harvested endoscopically;  Surgeon: Gaye Pollack, MD;  Location: [facility];  Service: Open Heart Surgery;  Laterality: N/A;  . Tee without cardioversion N/A 04/06/2015    Procedure: TRANSESOPHAGEAL ECHOCARDIOGRAM (TEE);  Surgeon: Gaye Pollack, MD;  Location: [facility];  Service: Open Heart Surgery;  Laterality: N/A;     Medications: Current Outpatient Prescriptions  Medication Sig Dispense Refill  . allopurinol (ZYLOPRIM) 300 MG tablet Take 300 mg by mouth daily as needed (for gout flare ups).     Marland Kitchen amLODipine (NORVASC) 10 MG tablet Take 1 tablet (10 mg total) by mouth daily. 90 tablet 3  . ANDROGEL PUMP 20.25 MG/ACT (1.62%) GEL Apply 1 application topically daily.   4  . aspirin EC 325 MG EC tablet Take 1 tablet (325 mg total) by mouth daily. 30 tablet 0  . chlorpheniramine-HYDROcodone (TUSSIONEX) 10-8 MG/5ML SUER Take 5 mLs by mouth every 12 (twelve) hours as needed for cough. 115 mL 0  . Cholecalciferol (VITAMIN D3) 1000 UNITS CAPS Take 1,000 Units  by mouth daily.      Marland Kitchen CIALIS 20 MG tablet TAKE ONE TABLET DAILY AS NEEDED FOR     ERECTILE DYSFUNCTION 10 tablet 5  . Coenzyme Q10 (CO Q 10) 100 MG CAPS Take 100 mg by mouth daily.      . fluticasone (FLONASE) 50 MCG/ACT nasal spray Place 2 sprays into both nostrils daily as needed for allergies or rhinitis.    Marland Kitchen lisinopril (PRINIVIL,ZESTRIL) 10 MG tablet Take 1 tablet (10 mg total) by mouth daily. 30 tablet 1  . metoprolol succinate (TOPROL-XL) 50 MG 24 hr tablet Take 1 tablet (50 mg total) by mouth 2 (two) times daily. 60 tablet 11  . montelukast (SINGULAIR) 10 MG tablet Take 10 mg by mouth at bedtime.    Marland Kitchen omega-3 acid ethyl esters (LOVAZA) 1 g capsule Take 1 g by mouth daily.    Marland Kitchen omeprazole (PRILOSEC) 40 MG capsule Take 1 capsule (40 mg total) by mouth daily as needed (acid reflux). 30 capsule 6  . oxyCODONE (OXY IR/ROXICODONE) 5 MG immediate release tablet Take 1-2 tablets (5-10 mg total) by mouth every 6 (six) hours as needed for severe pain. 50 tablet 0  . rosuvastatin (CRESTOR) 10 MG tablet Take 1 tablet (10 mg total) by mouth daily. 90 tablet 3   No current facility-administered medications for this visit.    Allergies: No Known Allergies  Social History: The patient  reports that he has never smoked. He has never used smokeless tobacco. He reports that he drinks about 3.0 oz of alcohol per week. He reports that he does not use illicit drugs.   Family History: The patient's family history includes COPD in his mother; Diabetes type II in his sister; Heart attack in his father; Lupus in his sister. There is no history of Colon cancer, Pancreatic cancer, or Stomach cancer.   Review of Systems: Please see the history of present illness.   Otherwise, the review of systems is positive for none.   All other systems are reviewed and negative.   Physical Exam: VS:  BP 116/68 mmHg  Pulse 64  Ht 5\' 10"  (1.778 m)  Wt 249 lb 12.8 oz (113.309 kg)  BMI 35.84 kg/m2 .  BMI Body mass index  is 35.84 kg/(m^2).  Wt Readings from Last 3 Encounters:  04/25/15 249 lb 12.8 oz (113.309 kg)  04/12/15 261 lb 6.4 oz (118.57 kg)  04/04/15 265 lb 6.4 oz (120.385 kg)    General: Pleasant. Well developed, well nourished and in no acute distress. His weight is down 20 pounds.  HEENT: Normal. Neck: Supple, no JVD, carotid bruits, or masses noted.  Cardiac: Regular rate and rhythm. No murmurs, rubs, or gallops. No edema. Sternum looks good.  Respiratory:  Lungs are fairly clear to auscultation bilaterally with normal work of breathing. Little diminished in the bases.  GI: Soft and nontender.  MS: No deformity or atrophy. Gait and ROM intact. Skin: Warm and dry. Color is normal.  Neuro:  Strength and sensation are intact and no gross focal deficits noted.  Psych: Alert, appropriate and with normal affect.   LABORATORY DATA:  EKG:  EKG is ordered today. This demonstrates NSR with nonspecific ST and T wave changes.  Lab Results  Component Value Date   WBC 9.7 04/11/2015   HGB 11.1* 04/11/2015   HCT 32.7* 04/11/2015   PLT 240 04/11/2015   GLUCOSE 103* 04/11/2015   CHOL 133 07/17/2014   TRIG 121.0 07/17/2014   HDL 34.60* 07/17/2014   LDLDIRECT 68.2 10/25/2010   LDLCALC 74 07/17/2014   ALT 33 04/04/2015   AST 25 04/04/2015   NA 141 04/11/2015   K 4.3 04/11/2015   CL 107 04/11/2015   CREATININE 0.85 04/11/2015   BUN 13 04/11/2015   CO2 26 04/11/2015   TSH 1.81 07/17/2014   PSA 1.03 08/29/2009   INR 1.39 04/06/2015   HGBA1C 5.9* 04/04/2015    BNP (last 3 results) No results for input(s): BNP in the last 8760 hours.  ProBNP (last 3 results) No results for input(s): PROBNP in the last 8760 hours.   Other Studies Reviewed Today:   Assessment/Plan: 1. CAD with recent CABG x 3 - he is doing well - probably over doing things a bit. Discussed activity level. Needs to cut the narcotic back - explained that this is really not for sleep. Recheck baseline labs today. Off  diuretic and potassium since last week - I do not think he needs. Will see back in 4 weeks with fasting labs. Long discussion about long term goals with diet/weight/exercise.   2. HTN - off HCTZ. BP looks great on his current regimen.  3. HLD - on statin. Ok to resume his fish oil.  4. Obesity - discussed at length again today.   Current medicines are reviewed with the patient today.  The patient does not have concerns regarding medicines other than what has been noted above.  The following changes have been made:  See above.  Labs/ tests ordered today include:    Orders Placed This Encounter  Procedures  . Basic metabolic panel  . CBC  . EKG 12-Lead     Disposition:   FU with me in 4  weeks.   Patient is agreeable to this plan and will call if any problems develop in the interim.   Signed: Burtis Junes, RN, ANP-C 04/25/2015 12:19 PM  [facility] [address] [place], [place]  [address] Phone: [phone] Fax: [phone]

## 2015-04-25 NOTE — Addendum Note (Signed)
Addended by: Loren Racer on: 04/25/2015 12:40 PM   Modules accepted: Orders

## 2015-04-25 NOTE — Patient Instructions (Addendum)
We will be checking the following labs today - BMET and CBC   Medication Instructions:    Continue with your current medicines.   I refilled the Omeprazole    Testing/Procedures To Be Arranged:  N/A  Follow-Up:   See me in 4 weeks and be fasting    Other Special Instructions:   N/A    If you need a refill on your cardiac medications before your next appointment, please call your pharmacy.   Call the [facility] office at [phone] if you have any questions, problems or concerns.

## 2015-05-01 ENCOUNTER — Other Ambulatory Visit: Payer: Self-pay | Admitting: Surgery

## 2015-05-01 DIAGNOSIS — Z951 Presence of aortocoronary bypass graft: Secondary | ICD-10-CM

## 2015-05-02 ENCOUNTER — Encounter: Payer: Self-pay | Admitting: Surgery

## 2015-05-02 ENCOUNTER — Ambulatory Visit (INDEPENDENT_AMBULATORY_CARE_PROVIDER_SITE_OTHER): Payer: Self-pay | Admitting: Surgery

## 2015-05-02 ENCOUNTER — Other Ambulatory Visit: Payer: Self-pay | Admitting: *Deleted

## 2015-05-02 ENCOUNTER — Ambulatory Visit
Admission: RE | Admit: 2015-05-02 | Discharge: 2015-05-02 | Disposition: A | Discharge status: Home / Self Care | Payer: BLUE CROSS/BLUE SHIELD | Source: Ambulatory Visit | Attending: Surgery | Admitting: Surgery

## 2015-05-02 VITALS — BP 127/74 | HR 64 | Resp 16 | Ht 70.0 in | Wt 249.8 lb

## 2015-05-02 DIAGNOSIS — G8918 Other acute postprocedural pain: Secondary | ICD-10-CM

## 2015-05-02 DIAGNOSIS — I2510 Atherosclerotic heart disease of native coronary artery without angina pectoris: Secondary | ICD-10-CM

## 2015-05-02 DIAGNOSIS — Z951 Presence of aortocoronary bypass graft: Secondary | ICD-10-CM

## 2015-05-02 MED ORDER — OXYCODONE HCL 5 MG PO TABS: 5.0000 mg | ORAL_TABLET | Freq: Four times a day (QID) | ORAL | Status: DC | PRN

## 2015-05-03 ENCOUNTER — Encounter (HOSPITAL_COMMUNITY)
Admission: RE | Admit: 2015-05-03 | Discharge: 2015-05-03 | Disposition: A | Discharge status: Home / Self Care | Payer: BLUE CROSS/BLUE SHIELD | Source: Ambulatory Visit | Attending: Cardiovascular Disease | Admitting: Cardiovascular Disease

## 2015-05-03 ENCOUNTER — Encounter (HOSPITAL_COMMUNITY): Payer: Self-pay

## 2015-05-03 DIAGNOSIS — Z951 Presence of aortocoronary bypass graft: Secondary | ICD-10-CM | POA: Diagnosis not present

## 2015-05-03 NOTE — Progress Notes (Signed)
Cardiac Individual Treatment Plan  Patient Details  Name: Leonard Edwards MRN: [id number] Date of Birth: 1955/04/23 Referring Provider:  Sherren Mocha, MD  Initial Encounter Date:       CARDIAC REHAB PHASE II ORIENTATION from 05/03/2015 in [facility]   Date  05/03/15      Visit Diagnosis: S/P CABG x 3  Patient's Home Medications on Admission:  Current outpatient prescriptions:  .  allopurinol (ZYLOPRIM) 300 MG tablet, Take 300 mg by mouth daily as needed (for gout flare ups). , Disp: , Rfl:  .  amLODipine (NORVASC) 10 MG tablet, Take 1 tablet (10 mg total) by mouth daily., Disp: 90 tablet, Rfl: 3 .  aspirin EC 325 MG EC tablet, Take 1 tablet (325 mg total) by mouth daily., Disp: 30 tablet, Rfl: 0 .  Cholecalciferol (VITAMIN D3) 1000 UNITS CAPS, Take 1,000 Units by mouth daily.  , Disp: , Rfl:  .  CIALIS 20 MG tablet, TAKE ONE TABLET DAILY AS NEEDED FOR     ERECTILE DYSFUNCTION, Disp: 10 tablet, Rfl: 5 .  Coenzyme Q10 (CO Q 10) 100 MG CAPS, Take 100 mg by mouth daily.  , Disp: , Rfl:  .  fluticasone (FLONASE) 50 MCG/ACT nasal spray, Place 2 sprays into both nostrils daily as needed for allergies or rhinitis., Disp: , Rfl:  .  lisinopril (PRINIVIL,ZESTRIL) 10 MG tablet, Take 1 tablet (10 mg total) by mouth daily., Disp: 30 tablet, Rfl: 1 .  metoprolol succinate (TOPROL-XL) 50 MG 24 hr tablet, Take 1 tablet (50 mg total) by mouth 2 (two) times daily., Disp: 60 tablet, Rfl: 11 .  montelukast (SINGULAIR) 10 MG tablet, Take 10 mg by mouth at bedtime., Disp: , Rfl:  .  omega-3 acid ethyl esters (LOVAZA) 1 g capsule, Take 1 g by mouth daily., Disp: , Rfl:  .  omeprazole (PRILOSEC) 40 MG capsule, Take 1 capsule (40 mg total) by mouth daily as needed (acid reflux)., Disp: 30 capsule, Rfl: 6 .  rosuvastatin (CRESTOR) 10 MG tablet, Take 1 tablet (10 mg total) by mouth daily., Disp: 90 tablet, Rfl: 3 .  ANDROGEL PUMP 20.25 MG/ACT (1.62%) GEL, Apply 1 application  topically daily. Reported on 05/03/2015, Disp: , Rfl: 4 .  oxyCODONE (OXY IR/ROXICODONE) 5 MG immediate release tablet, Take 1-2 tablets (5-10 mg total) by mouth every 6 (six) hours as needed for severe pain. (Patient taking differently: Take 5-10 mg by mouth every 6 (six) hours as needed for severe pain. Using occasionally), Disp: 40 tablet, Rfl: 0  Past Medical History: Past Medical History  Diagnosis Date  . CAD (coronary artery disease)     DES to distal RCA in 09/2004; DES to Ramus Intermediate 10/2009 following abnormal stress test  . Hyperlipidemia   . Hypertension   . GERD (gastroesophageal reflux disease)   . Hiatal hernia   . Gastric polyp   . Esophageal stricture   . Gout   . Benign prostatic hypertrophy   . Obesity   . Fibromyalgia   . Pneumonia     hx of    Tobacco Use: History  Smoking status  . Never Smoker   Smokeless tobacco  . Never Used    Labs: Recent Review Flowsheet Data    Labs for ITP Cardiac and Pulmonary Rehab Latest Ref Rng 04/06/2015 04/06/2015 04/06/2015 04/06/2015 04/07/2015   PHART 7.350 - 7.450 7.344(L) 7.359 7.311(L) - -   PCO2ART 35.0 - 45.0 mmHg 49.5(H) 42.2 44.0 - -  HCO3 20.0 - 24.0 mEq/L 26.9(H) 23.8 22.2 - -   TCO2 0 - 100 mmol/L 28 25 24 24 27    ACIDBASEDEF 0.0 - 2.0 mmol/L - 2.0 4.0(H) - -   O2SAT - 94.0 91.0 89.0 - -      Capillary Blood Glucose: Lab Results  Component Value Date   GLUCAP 86 04/08/2015   GLUCAP 97 04/07/2015   GLUCAP 143* 04/07/2015   GLUCAP 111* 04/07/2015   GLUCAP 110* 04/07/2015     Exercise Target Goals: Date: 05/03/15  Exercise Program Goal: Individual exercise prescription set with THRR, safety & activity barriers. Participant demonstrates ability to understand and report RPE using BORG scale, to self-measure pulse accurately, and to acknowledge the importance of the exercise prescription.  Exercise Prescription Goal: Starting with aerobic activity 30 plus minutes a day, 3 days per week for initial  exercise prescription. Provide home exercise prescription and guidelines that participant acknowledges understanding prior to discharge.  Activity Barriers & Risk Stratification:     Activity Barriers & Cardiac Risk Stratification - 05/03/15 0845    Activity Barriers & Cardiac Risk Stratification   Activity Barriers None   Cardiac Risk Stratification High      6 Minute Walk:     6 Minute Walk      05/03/15 1027       6 Minute Walk   Phase Initial     Distance 1625 feet     Walk Time 6 minutes     # of Rest Breaks 0     MPH 3     METS 3.3     RPE 7     VO2 Peak 11.67     Symptoms No     Resting HR 67 bpm     Resting BP 134/60 mmHg     Max Ex. HR 70 bpm     Max Ex. BP 138/70 mmHg     2 Minute Post BP 106/64 mmHg     Interval HR   Baseline HR 67     6 Minute HR 70     2 Minute Post HR 61        Initial Exercise Prescription:     Initial Exercise Prescription - 05/03/15 1000    Date of Initial Exercise Prescription   Date 05/03/15   Treadmill   MPH 2.5   Grade 1   Minutes 10   METs 3.2   Bike   Level 1   Minutes 10   METs 3.2   NuStep   Level 3   Minutes 10   METs 2   Prescription Details   Frequency (times per week) 3   Duration Progress to 30 minutes of continuous aerobic without signs/symptoms of physical distress   Intensity   THRR 40-80% of Max Heartrate 64-129   Ratings of Perceived Exertion 11-13   Progression   Progression Continue to progress workloads to maintain intensity without signs/symptoms of physical distress.   Resistance Training   Training Prescription Yes   Weight 2   Reps 10-12      Perform Capillary Blood Glucose checks as needed.  Exercise Prescription Changes:   Exercise Comments:   Discharge Exercise Prescription (Final Exercise Prescription Changes):   Nutrition:  Target Goals: Understanding of nutrition guidelines, daily intake of sodium 1500mg , cholesterol 200mg , calories 30% from fat and 7% or less from  saturated fats, daily to have 5 or more servings of fruits and vegetables.  Biometrics:  Pre Biometrics - 05/03/15 1038    Pre Biometrics   Waist Circumference 43 inches   Hip Circumference 43.75 inches   Waist to Hip Ratio 0.98 %   Triceps Skinfold 9 mm   % Body Fat 29.4 %   Grip Strength 45 kg   Single Leg Stand 4.23 seconds       Nutrition Therapy Plan and Nutrition Goals:   Nutrition Discharge: Nutrition Scores:   Nutrition Goals Re-Evaluation:   Psychosocial: Target Goals: Acknowledge presence or absence of depression, maximize coping skills, provide positive support system. Participant is able to verbalize types and ability to use techniques and skills needed for reducing stress and depression.  Initial Review & Psychosocial Screening:   Quality of Life Scores:     Quality of Life - 05/03/15 1037    Quality of Life Scores   Health/Function Pre 24 %   Socioeconomic Pre 27.71 %   Psych/Spiritual Pre 27.43 %   Family Pre 30 %   GLOBAL Pre 26.35 %      PHQ-9:     Recent Review Flowsheet Data    There is no flowsheet data to display.      Psychosocial Evaluation and Intervention:   Psychosocial Re-Evaluation:   Vocational Rehabilitation: Provide vocational rehab assistance to qualifying candidates.   Vocational Rehab Evaluation & Intervention:     Vocational Rehab - 05/03/15 1358    Initial Vocational Rehab Evaluation & Intervention   Assessment shows need for Vocational Rehabilitation No  Pt indicated that he would be able to go back to work with no difficulty.      Education: Education Goals: Education classes will be provided on a weekly basis, covering required topics. Participant will state understanding/return demonstration of topics presented.  Learning Barriers/Preferences:     Learning Barriers/Preferences - 05/03/15 1057    Learning Barriers/Preferences   Learning Barriers Sight;Hearing   Learning Preferences None       Education Topics: Count Your Pulse:  -Group instruction provided by verbal instruction, demonstration, patient participation and written materials to support subject.  Instructors address importance of being able to find your pulse and how to count your pulse when at home without a heart monitor.  Patients get hands on experience counting their pulse with staff help and individually.   Heart Attack, Angina, and Risk Factor Modification:  -Group instruction provided by verbal instruction, video, and written materials to support subject.  Instructors address signs and symptoms of angina and heart attacks.    Also discuss risk factors for heart disease and how to make changes to improve heart health risk factors.   Functional Fitness:  -Group instruction provided by verbal instruction, demonstration, patient participation, and written materials to support subject.  Instructors address safety measures for doing things around the house.  Discuss how to get up and down off the floor, how to pick things up properly, how to safely get out of a chair without assistance, and balance training.   Meditation and Mindfulness:  -Group instruction provided by verbal instruction, patient participation, and written materials to support subject.  Instructor addresses importance of mindfulness and meditation practice to help reduce stress and improve awareness.  Instructor also leads participants through a meditation exercise.    Stretching for Flexibility and Mobility:  -Group instruction provided by verbal instruction, patient participation, and written materials to support subject.  Instructors lead participants through series of stretches that are designed to increase flexibility thus improving mobility.  These stretches are additional exercise  for major muscle groups that are typically performed during regular warm up and cool down.   Hands Only CPR Anytime:  -Group instruction provided by verbal  instruction, video, patient participation and written materials to support subject.  Instructors co-teach with AHA video for hands only CPR.  Participants get hands on experience with mannequins.   Nutrition I class: Heart Healthy Eating:  -Group instruction provided by PowerPoint slides, verbal discussion, and written materials to support subject matter. The instructor gives an explanation and review of the Therapeutic Lifestyle Changes diet recommendations, which includes a discussion on lipid goals, dietary fat, sodium, fiber, plant stanol/sterol esters, sugar, and the components of a well-balanced, healthy diet.   Nutrition II class: Lifestyle Skills:  -Group instruction provided by PowerPoint slides, verbal discussion, and written materials to support subject matter. The instructor gives an explanation and review of label reading, grocery shopping for heart health, heart healthy recipe modifications, and ways to make healthier choices when eating out.   Diabetes Question & Answer:  -Group instruction provided by PowerPoint slides, verbal discussion, and written materials to support subject matter. The instructor gives an explanation and review of diabetes co-morbidities, pre- and post-prandial blood glucose goals, pre-exercise blood glucose goals, signs, symptoms, and treatment of hypoglycemia and hyperglycemia, and foot care basics.   Diabetes Blitz:  -Group instruction provided by PowerPoint slides, verbal discussion, and written materials to support subject matter. The instructor gives an explanation and review of the physiology behind type 1 and type 2 diabetes, diabetes medications and rational behind using different medications, pre- and post-prandial blood glucose recommendations and Hemoglobin A1c goals, diabetes diet, and exercise including blood glucose guidelines for exercising safely.    Portion Distortion:  -Group instruction provided by PowerPoint slides, verbal discussion,  written materials, and food models to support subject matter. The instructor gives an explanation of serving size versus portion size, changes in portions sizes over the last 20 years, and what consists of a serving from each food group.   Stress Management:  -Group instruction provided by verbal instruction, video, and written materials to support subject matter.  Instructors review role of stress in heart disease and how to cope with stress positively.     Exercising on Your Own:  -Group instruction provided by verbal instruction, power point, and written materials to support subject.  Instructors discuss benefits of exercise, components of exercise, frequency and intensity of exercise, and end points for exercise.  Also discuss use of nitroglycerin and activating EMS.  Review options of places to exercise outside of rehab.  Review guidelines for sex with heart disease.   Cardiac Drugs I:  -Group instruction provided by verbal instruction and written materials to support subject.  Instructor reviews cardiac drug classes: antiplatelets, anticoagulants, beta blockers, and statins.  Instructor discusses reasons, side effects, and lifestyle considerations for each drug class.   Cardiac Drugs II:  -Group instruction provided by verbal instruction and written materials to support subject.  Instructor reviews cardiac drug classes: angiotensin converting enzyme inhibitors (ACE-I), angiotensin II receptor blockers (ARBs), nitrates, and calcium channel blockers.  Instructor discusses reasons, side effects, and lifestyle considerations for each drug class.   Anatomy and Physiology of the Circulatory System:  -Group instruction provided by verbal instruction, video, and written materials to support subject.  Reviews functional anatomy of heart, how it relates to various diagnoses, and what role the heart plays in the overall system.   Knowledge Questionnaire Score:     Knowledge Questionnaire Score -  05/03/15 1031    Knowledge Questionnaire Score   Pre Score 88      Core Components/Risk Factors/Patient Goals at Admission:     Personal Goals and Risk Factors at Admission - 05/03/15 0846    Core Components/Risk Factors/Patient Goals on Admission    Weight Management Yes;Obesity;Weight Loss   Intervention Weight Management: Develop a combined nutrition and exercise program designed to reach desired caloric intake, while maintaining appropriate intake of nutrient and fiber, sodium and fats, and appropriate energy expenditure required for the weight goal.;Weight Management: Provide education and appropriate resources to help participant work on and attain dietary goals.;Weight Management/Obesity: Establish reasonable short term and long term weight goals.;Obesity: Provide education and appropriate resources to help participant work on and attain dietary goals.   Admit Weight 249 lb 12.5 oz (113.3 kg)   Goal Weight: Short Term 239 lb (108.41 kg)   Goal Weight: Long Term 209 lb (94.802 kg)   Expected Outcomes Short Term: Continue to assess and modify interventions until short term weight is achieved;Long Term: Adherence to nutrition and physical activity/exercise program aimed toward attainment of established weight goal;Weight Maintenance: Understanding of the daily nutrition guidelines, which includes 25-35% calories from fat, 7% or less cal from saturated fats, less than 200mg  cholesterol, less than 1.5gm of sodium, & 5 or more servings of fruits and vegetables daily;Weight Loss: Understanding of general recommendations for a balanced deficit meal plan, which promotes 1-2 lb weight loss per week and includes a negative energy balance of [phone] kcal/d;Understanding recommendations for meals to include 15-35% energy as protein, 25-35% energy from fat, 35-60% energy from carbohydrates, less than 200mg  of dietary cholesterol, 20-35 gm of total fiber daily;Understanding of distribution of calorie intake  throughout the day with the consumption of 4-5 meals/snacks   Hypertension Yes   Intervention Provide education on lifestyle modifcations including regular physical activity/exercise, weight management, moderate sodium restriction and increased consumption of fresh fruit, vegetables, and low fat dairy, alcohol moderation, and smoking cessation.;Monitor prescription use compliance.   Expected Outcomes Short Term: Continued assessment and intervention until BP is < 140/27mm HG in hypertensive participants. < 130/87mm HG in hypertensive participants with diabetes, heart failure or chronic kidney disease.;Long Term: Maintenance of blood pressure at goal levels.   Lipids Yes   Intervention Provide education and support for participant on nutrition & aerobic/resistive exercise along with prescribed medications to achieve LDL 70mg , HDL >40mg .   Expected Outcomes Short Term: Participant states understanding of desired cholesterol values and is compliant with medications prescribed. Participant is following exercise prescription and nutrition guidelines.;Long Term: Cholesterol controlled with medications as prescribed, with individualized exercise RX and with personalized nutrition plan. Value goals: LDL < 70mg , HDL > 40 mg.   Stress Yes   Intervention Offer individual and/or small group education and counseling on adjustment to heart disease, stress management and health-related lifestyle change. Teach and support self-help strategies.   Expected Outcomes Long Term: Emotional wellbeing is indicated by absence of clinically significant psychosocial distress or social isolation.;Short Term: Participant demonstrates changes in health-related behavior, relaxation and other stress management skills, ability to obtain effective social support, and compliance with psychotropic medications if prescribed.   Personal Goal Other Yes   Personal Goal Learn nutrition guidelines, find new normal/routine for gym to continue,  increase flexibilty   Intervention Provided nutrition education and exercise prescription in program, Challenge pt with exercise program   Expected Outcomes Adherance to diet and exercise regimine and an increase in sit and reach results  Core Components/Risk Factors/Patient Goals Review:    Core Components/Risk Factors/Patient Goals at Discharge (Final Review):    ITP Comments:     ITP Comments      05/03/15 0845           ITP Comments Medical Director-Dr. Fransico Him, MD          Comments: Pt completed orientation today.  As a part of orientation, 6 minute walk test completed.  Monitor showed Sr with no noted ectopy.  This is unchanged from most recent 12 lead ekg. Cherre Huger, BSN

## 2015-05-03 NOTE — Progress Notes (Signed)
Cardiac Rehab Medication Review by a Pharmacist  Does the patient  feel that his/her medications are working for him/her?  yes  Has the patient been experiencing any side effects to the medications prescribed?  no  Does the patient measure his/her own blood pressure or blood glucose at home?  yes   Does the patient have any problems obtaining medications due to transportation or finances?   no  Understanding of regimen: excellent Understanding of indications: excellent Potential of compliance: good  Pharmacist comments: Leonard Edwards is a 60 yo M who is doing well with his medications. He reports rarely missing doses and good understanding of his regimen. While he was here we updated his medication list that he keeps on his smart phone. I answered all of his questions and encouraged him to continue to keep his medication list up to date.  Dimitri Ped, PharmD. PGY-1 Pharmacy Resident Pager: [phone] 05/03/2015 8:34 AM

## 2015-05-04 ENCOUNTER — Encounter: Payer: Self-pay | Admitting: Surgery

## 2015-05-04 NOTE — Progress Notes (Signed)
HPI: Patient returns for routine postoperative follow-up having undergone CABG x 3 using bilateral IMA grafts on 04/06/2015. The patient's early postoperative recovery while in the hospital was notable for an uncomplicated postop course. Since hospital discharge the patient reports that he has been feeling well. He is walking daily without chest pain or shortness of breath and is anxious to return to the gym.   Current Outpatient Prescriptions  Medication Sig Dispense Refill  . allopurinol (ZYLOPRIM) 300 MG tablet Take 300 mg by mouth daily as needed (for gout flare ups).     Marland Kitchen amLODipine (NORVASC) 10 MG tablet Take 1 tablet (10 mg total) by mouth daily. 90 tablet 3  . ANDROGEL PUMP 20.25 MG/ACT (1.62%) GEL Apply 1 application topically daily. Reported on 05/03/2015  4  . aspirin EC 325 MG EC tablet Take 1 tablet (325 mg total) by mouth daily. 30 tablet 0  . Cholecalciferol (VITAMIN D3) 1000 UNITS CAPS Take 1,000 Units by mouth daily.      Marland Kitchen CIALIS 20 MG tablet TAKE ONE TABLET DAILY AS NEEDED FOR     ERECTILE DYSFUNCTION 10 tablet 5  . Coenzyme Q10 (CO Q 10) 100 MG CAPS Take 100 mg by mouth daily.      . fluticasone (FLONASE) 50 MCG/ACT nasal spray Place 2 sprays into both nostrils daily as needed for allergies or rhinitis.    Marland Kitchen lisinopril (PRINIVIL,ZESTRIL) 10 MG tablet Take 1 tablet (10 mg total) by mouth daily. 30 tablet 1  . metoprolol succinate (TOPROL-XL) 50 MG 24 hr tablet Take 1 tablet (50 mg total) by mouth 2 (two) times daily. 60 tablet 11  . montelukast (SINGULAIR) 10 MG tablet Take 10 mg by mouth at bedtime.    Marland Kitchen omega-3 acid ethyl esters (LOVAZA) 1 g capsule Take 1 g by mouth daily.    Marland Kitchen omeprazole (PRILOSEC) 40 MG capsule Take 1 capsule (40 mg total) by mouth daily as needed (acid reflux). 30 capsule 6  . rosuvastatin (CRESTOR) 10 MG tablet Take 1 tablet (10 mg total) by mouth daily. 90 tablet 3  . oxyCODONE (OXY IR/ROXICODONE) 5 MG immediate release tablet Take 1-2 tablets  (5-10 mg total) by mouth every 6 (six) hours as needed for severe pain. (Patient taking differently: Take 5-10 mg by mouth every 6 (six) hours as needed for severe pain. Using occasionally) 40 tablet 0   No current facility-administered medications for this visit.    Physical Exam: BP 127/74 mmHg  Pulse 64  Resp 16  Ht 5\' 10"  (1.778 m)  Wt 249 lb 12.8 oz (113.309 kg)  BMI 35.84 kg/m2  SpO2 98% He looks well. Lung exam is clear. Cardiac exam shows a regular rate and rhythm with normal heart sounds. Chest incision is healing well and sternum is stable. The leg incisions are healing well and there is no peripheral edema.    Diagnostic Tests:  CLINICAL DATA: CABG.  EXAM: CHEST 2 VIEW  COMPARISON: 04/11/2015.  FINDINGS: Mediastinum hilar structures stable. Prior CABG. Stable cardiomegaly. Interim near complete resolution of basilar atelectasis and/or infiltrates/edema with mild residual in the right base. No pleural effusion or pneumothorax.  IMPRESSION: 1. Prior CABG. Stable cardiomegaly. No pulmonary venous congestion.  2. Interim near complete resolution basilar atelectasis and/or infiltrates/edema with mild residual in right lung base.   Electronically Signed  By: Marcello Moores Register  On: 05/02/2015 15:25  Impression:  Overall I think he is doing well. I encouraged him to continue walking. He  is planning to participate in cardiac rehab. I told him he could drive his car but should not lift anything heavier than 10 lbs for three months postop.   Plan:  I will see him back in one month for follow up.   Gaye Pollack, MD [facility] [phone]

## 2015-05-07 ENCOUNTER — Telehealth (HOSPITAL_COMMUNITY): Payer: Self-pay | Admitting: *Deleted

## 2015-05-07 ENCOUNTER — Encounter (HOSPITAL_COMMUNITY): Payer: BLUE CROSS/BLUE SHIELD

## 2015-05-09 ENCOUNTER — Encounter (HOSPITAL_COMMUNITY): Payer: Self-pay

## 2015-05-09 ENCOUNTER — Encounter (HOSPITAL_COMMUNITY)
Admission: RE | Admit: 2015-05-09 | Discharge: 2015-05-09 | Disposition: A | Discharge status: Home / Self Care | Payer: BLUE CROSS/BLUE SHIELD | Source: Ambulatory Visit | Attending: Cardiovascular Disease | Admitting: Cardiovascular Disease

## 2015-05-09 ENCOUNTER — Ambulatory Visit: Payer: BLUE CROSS/BLUE SHIELD | Admitting: Surgery

## 2015-05-09 DIAGNOSIS — Z951 Presence of aortocoronary bypass graft: Secondary | ICD-10-CM

## 2015-05-09 NOTE — Progress Notes (Signed)
Daily Session Note  Patient Details  Name: Nora M Rieman MRN: [id number] Date of Birth: 05/14/1955 Referring Provider:  Shaw, William, MD  Encounter Date: 05/09/2015  Check In:     Session Check In - 05/09/15 1350    Check-In   Location [facility]   Staff Present Carlette Carlton, RN, BSN;Amber Fair, MS, ACSM RCEP, Exercise Physiologist;Olinty Richards, MS, ACSM CEP, Exercise Physiologist;Joann Rion, RN, BSN   Supervising physician immediately available to respond to emergencies [facility] immediately available   Physician(s) Dr. Merrell   Medication changes reported     No   Fall or balance concerns reported    No   Warm-up and Cool-down Not performed (comment)   Resistance Training Performed No   VAD Patient? No   Pain Assessment   Currently in Pain? No/denies   Multiple Pain Sites No      Capillary Blood Glucose: No results found for this or any previous visit (from the past 24 hour(s)).   Goals Met:  Exercise tolerated well  Goals Unmet:  Not Applicable  Comments: Pt started cardiac rehab today.  Pt tolerated light exercise without difficulty. VSS, telemetry-sinus, asymptomatic.  Medication list reconciled. Pt denies barriers to medicaiton compliance.  PSYCHOSOCIAL ASSESSMENT:  PHQ-0. Pt exhibits positive coping skills, hopeful outlook with supportive family. No psychosocial needs identified at this time, no psychosocial interventions necessary.    Pt enjoys outdoor activities such as fishing, horses and playing golf.   Pt individual goals for cardiac rehab include learning lifestyle modifications to improve his heart health and regain strength, stamina to routine to his extensive exercise routine.  Pt oriented to exercise equipment and routine.    Understanding verbalized.   Dr. Traci Turner is Medical Director for Cardiac Rehab at [facility]. 

## 2015-05-10 ENCOUNTER — Other Ambulatory Visit: Payer: Self-pay | Admitting: Cardiovascular Disease

## 2015-05-10 ENCOUNTER — Other Ambulatory Visit: Payer: Self-pay | Admitting: Surgical

## 2015-05-11 ENCOUNTER — Encounter (HOSPITAL_COMMUNITY)
Admission: RE | Admit: 2015-05-11 | Discharge: 2015-05-11 | Disposition: A | Discharge status: Home / Self Care | Payer: BLUE CROSS/BLUE SHIELD | Source: Ambulatory Visit | Attending: Cardiovascular Disease | Admitting: Cardiovascular Disease

## 2015-05-11 DIAGNOSIS — Z951 Presence of aortocoronary bypass graft: Secondary | ICD-10-CM | POA: Diagnosis not present

## 2015-05-14 ENCOUNTER — Encounter (HOSPITAL_COMMUNITY)
Admission: RE | Admit: 2015-05-14 | Discharge: 2015-05-14 | Disposition: A | Discharge status: Home / Self Care | Payer: BLUE CROSS/BLUE SHIELD | Source: Ambulatory Visit | Attending: Cardiovascular Disease | Admitting: Cardiovascular Disease

## 2015-05-14 DIAGNOSIS — Z951 Presence of aortocoronary bypass graft: Secondary | ICD-10-CM | POA: Diagnosis not present

## 2015-05-14 NOTE — Progress Notes (Signed)
Leonard Edwards 60 y.o. male Nutrition Note Spoke with pt. Nutrition Plan and Nutrition Survey goals reviewed with pt. Pt is following Step 2 of the Therapeutic Lifestyle Changes diet. Pt wants to lose wt. Pt has been trying to lose wt. Pt states he lost about 20 lb with his surgery. Wt loss tips reviewed. Pt is pre-diabetic according to the last A1c. Pre-diabetes discussed. Pt encouraged to discuss pre-diabetes with his PCP. Pt expressed understanding of the information reviewed. Pt aware of nutrition education classes offered and plans on attending nutrition classes.  Lab Results  Component Value Date   HGBA1C 5.9* 04/04/2015   Wt Readings from Last 3 Encounters:  05/02/15 249 lb 12.8 oz (113.309 kg)  04/25/15 249 lb 12.8 oz (113.309 kg)  04/12/15 261 lb 6.4 oz (118.57 kg)   Nutrition Diagnosis ? Food-and nutrition-related knowledge deficit related to lack of exposure to information as related to diagnosis of: ? CVD ? Pre-DM ? Obesity related to excessive energy intake as evidenced by a BMI of 35.8  Nutrition RX/ Estimated Daily Nutrition Needs for: wt loss 1750-2250 Kcal, 45-60 gm fat, 11-15 gm sat fat, 1.7-2.2 gm trans-fat, <1500 mg sodium  Nutrition Intervention ? Pt's individual nutrition plan reviewed with pt. ? Benefits of adopting Therapeutic Lifestyle Changes discussed when Medficts reviewed. ? Pt to attend the Portion Distortion class ? Pt to attend the   ? Nutrition I class                  ? Nutrition II class ? Continue client-centered nutrition education by RD, as part of interdisciplinary care. Goal(s) ? Pt to identify food quantities necessary to achieve weight loss of 6-24 lb (2.7-10.9 kg) at graduation from cardiac rehab.  ? Pt to describe the benefit of including fruits, vegetables, whole grains, and low-fat dairy products in a heart healthy meal plan. Monitor and Evaluate progress toward nutrition goal with team. Derek Mound, M.Ed, RD, LDN, CDE 05/14/2015 2:28  PM

## 2015-05-16 ENCOUNTER — Other Ambulatory Visit: Payer: Self-pay

## 2015-05-16 ENCOUNTER — Encounter (HOSPITAL_COMMUNITY): Admission: RE | Admit: 2015-05-16 | Payer: BLUE CROSS/BLUE SHIELD | Source: Ambulatory Visit

## 2015-05-16 ENCOUNTER — Emergency Department (HOSPITAL_COMMUNITY): Payer: BLUE CROSS/BLUE SHIELD

## 2015-05-16 ENCOUNTER — Emergency Department (HOSPITAL_COMMUNITY)
Admission: EM | Admit: 2015-05-16 | Discharge: 2015-05-16 | Disposition: A | Discharge status: Home / Self Care | Payer: BLUE CROSS/BLUE SHIELD | Attending: Emergency Medicine | Admitting: Emergency Medicine

## 2015-05-16 ENCOUNTER — Telehealth (HOSPITAL_COMMUNITY): Payer: Self-pay | Admitting: Internal Medicine

## 2015-05-16 ENCOUNTER — Encounter (HOSPITAL_COMMUNITY): Payer: Self-pay | Admitting: *Deleted

## 2015-05-16 DIAGNOSIS — Z87438 Personal history of other diseases of male genital organs: Secondary | ICD-10-CM | POA: Insufficient documentation

## 2015-05-16 DIAGNOSIS — Z8701 Personal history of pneumonia (recurrent): Secondary | ICD-10-CM | POA: Insufficient documentation

## 2015-05-16 DIAGNOSIS — E663 Overweight: Secondary | ICD-10-CM | POA: Diagnosis not present

## 2015-05-16 DIAGNOSIS — R1084 Generalized abdominal pain: Secondary | ICD-10-CM | POA: Diagnosis not present

## 2015-05-16 DIAGNOSIS — Z7982 Long term (current) use of aspirin: Secondary | ICD-10-CM | POA: Insufficient documentation

## 2015-05-16 DIAGNOSIS — Z79899 Other long term (current) drug therapy: Secondary | ICD-10-CM | POA: Diagnosis not present

## 2015-05-16 DIAGNOSIS — E669 Obesity, unspecified: Secondary | ICD-10-CM | POA: Diagnosis not present

## 2015-05-16 DIAGNOSIS — R39198 Other difficulties with micturition: Secondary | ICD-10-CM | POA: Diagnosis not present

## 2015-05-16 DIAGNOSIS — I10 Essential (primary) hypertension: Secondary | ICD-10-CM | POA: Insufficient documentation

## 2015-05-16 DIAGNOSIS — R918 Other nonspecific abnormal finding of lung field: Secondary | ICD-10-CM | POA: Diagnosis not present

## 2015-05-16 DIAGNOSIS — I2510 Atherosclerotic heart disease of native coronary artery without angina pectoris: Secondary | ICD-10-CM | POA: Diagnosis not present

## 2015-05-16 DIAGNOSIS — K219 Gastro-esophageal reflux disease without esophagitis: Secondary | ICD-10-CM | POA: Diagnosis not present

## 2015-05-16 DIAGNOSIS — M109 Gout, unspecified: Secondary | ICD-10-CM | POA: Insufficient documentation

## 2015-05-16 DIAGNOSIS — E785 Hyperlipidemia, unspecified: Secondary | ICD-10-CM | POA: Insufficient documentation

## 2015-05-16 DIAGNOSIS — R109 Unspecified abdominal pain: Secondary | ICD-10-CM | POA: Diagnosis not present

## 2015-05-16 LAB — TROPONIN I: Troponin I: <0.03 ng/mL (ref <0.031)

## 2015-05-16 LAB — URINALYSIS, ROUTINE W REFLEX MICROSCOPIC
Bilirubin Urine: NEGATIVE
GLUCOSE, UA: NEGATIVE mg/dL
Hgb urine dipstick: NEGATIVE
KETONES UR: NEGATIVE mg/dL
Leukocytes, UA: NEGATIVE
NITRITE: NEGATIVE
PH: 5.5 (ref 5.0–8.0)
Protein, ur: NEGATIVE mg/dL
Specific Gravity, Urine: 1.025 (ref 1.005–1.030)

## 2015-05-16 LAB — CBC
HEMATOCRIT: 41.6 % (ref 39.0–52.0)
Hemoglobin: 13.7 g/dL (ref 13.0–17.0)
MCH: 29 pg (ref 26.0–34.0)
MCHC: 32.9 g/dL (ref 30.0–36.0)
MCV: 87.9 fL (ref 78.0–100.0)
Platelets: 351 10*3/uL (ref 150–400)
RBC: 4.73 MIL/uL (ref 4.22–5.81)
RDW: 13.1 % (ref 11.5–15.5)
WBC: 13.3 10*3/uL — ABNORMAL HIGH (ref 4.0–10.5)

## 2015-05-16 LAB — COMPREHENSIVE METABOLIC PANEL
ALBUMIN: 3.8 g/dL (ref 3.5–5.0)
ALK PHOS: 94 U/L (ref 38–126)
ALT: 30 U/L (ref 17–63)
AST: 33 U/L (ref 15–41)
Anion gap: 13 (ref 5–15)
BILIRUBIN TOTAL: 1.2 mg/dL (ref 0.3–1.2)
BUN: 11 mg/dL (ref 6–20)
CO2: 21 mmol/L — ABNORMAL LOW (ref 22–32)
CREATININE: 0.76 mg/dL (ref 0.61–1.24)
Calcium: 9.3 mg/dL (ref 8.9–10.3)
Chloride: 105 mmol/L (ref 101–111)
GFR calc Af Amer: >60 mL/min (ref >60)
GFR calc non Af Amer: >60 mL/min (ref >60)
GLUCOSE: 144 mg/dL — AB (ref 65–99)
POTASSIUM: 3.3 mmol/L — AB (ref 3.5–5.1)
Sodium: 139 mmol/L (ref 135–145)
TOTAL PROTEIN: 7.6 g/dL (ref 6.5–8.1)

## 2015-05-16 LAB — LIPASE, BLOOD: Lipase: 39 U/L (ref 11–51)

## 2015-05-16 MED ORDER — ONDANSETRON HCL 4 MG/2ML IJ SOLN
INTRAMUSCULAR | Status: AC
  Filled 2015-05-16: qty 2

## 2015-05-16 MED ORDER — IOPAMIDOL (ISOVUE-370) INJECTION 76%
INTRAVENOUS | Status: AC
Start: 2015-05-16 — End: 2015-05-16
  Administered 2015-05-16: 100 mL
  Filled 2015-05-16: qty 100

## 2015-05-16 MED ORDER — MORPHINE SULFATE (PF) 4 MG/ML IV SOLN
INTRAVENOUS | Status: DC
Start: 2015-05-16 — End: 2015-05-16
  Filled 2015-05-16: qty 1

## 2015-05-16 MED ORDER — POLYETHYLENE GLYCOL 3350 17 GM/SCOOP PO POWD: ORAL | Status: DC

## 2015-05-16 MED ORDER — GI COCKTAIL ~~LOC~~
30.0000 mL | Freq: Once | ORAL | Status: AC
Start: 2015-05-16 — End: 2015-05-16
  Administered 2015-05-16: 30 mL via ORAL
  Filled 2015-05-16: qty 30

## 2015-05-16 MED ORDER — MORPHINE SULFATE (PF) 4 MG/ML IV SOLN
4.0000 mg | Freq: Once | INTRAVENOUS | Status: AC
  Administered 2015-05-16: 4 mg via INTRAVENOUS

## 2015-05-16 MED ORDER — ONDANSETRON HCL 4 MG/2ML IJ SOLN
4.0000 mg | Freq: Once | INTRAMUSCULAR | Status: AC
  Administered 2015-05-16: 4 mg via INTRAVENOUS

## 2015-05-16 NOTE — Discharge Instructions (Signed)
You were seen today for abdominal pain. The cause of your pain is unclear at this time. Your workup is reassuring.  Given your recent history of narcotic use and decrease stools, you could be having constipation. Use MiraLAX at home.  Abdominal Pain, Adult Many things can cause abdominal pain. Usually, abdominal pain is not caused by a disease and will improve without treatment. It can often be observed and treated at home. Your health care provider will do a physical exam and possibly order blood tests and X-rays to help determine the seriousness of your pain. However, in many cases, more time must pass before a clear cause of the pain can be found. Before that point, your health care provider may not know if you need more testing or further treatment. HOME CARE INSTRUCTIONS Monitor your abdominal pain for any changes. The following actions may help to alleviate any discomfort you are experiencing:  Only take over-the-counter or prescription medicines as directed by your health care provider.  Do not take laxatives unless directed to do so by your health care provider.  Try a clear liquid diet (broth, tea, or water) as directed by your health care provider. Slowly move to a bland diet as tolerated. SEEK MEDICAL CARE IF:  You have unexplained abdominal pain.  You have abdominal pain associated with nausea or diarrhea.  You have pain when you urinate or have a bowel movement.  You experience abdominal pain that wakes you in the night.  You have abdominal pain that is worsened or improved by eating food.  You have abdominal pain that is worsened with eating fatty foods.  You have a fever. SEEK IMMEDIATE MEDICAL CARE IF:  Your pain does not go away within 2 hours.  You keep throwing up (vomiting).  Your pain is felt only in portions of the abdomen, such as the right side or the left lower portion of the abdomen.  You pass bloody or black tarry stools. MAKE SURE YOU:  Understand  these instructions.  Will watch your condition.  Will get help right away if you are not doing well or get worse.   This information is not intended to replace advice given to you by your health care provider. Make sure you discuss any questions you have with your health care provider.   Document Released: 10/30/2004 Document Revised: 10/11/2014 Document Reviewed: 09/29/2012 Elsevier Interactive Patient Education [employer].

## 2015-05-16 NOTE — ED Provider Notes (Signed)
CSN: [id number]     Arrival date & time 05/16/15  0253 History   By signing my name below, I, Forrestine Him, attest that this documentation has been prepared under the direction and in the presence of Merryl Hacker, MD.  Electronically Signed: Forrestine Him, ED Scribe. 05/16/2015. 3:30 AM.   Chief Complaint  Patient presents with  . Abdominal Pain   The history is provided by the patient. No language interpreter was used.    HPI Comments: Leonard Edwards is a 60 y.o. male with a PMHx of CAD, hyperlipidemia, and HTN who presents to the Emergency Department complaining of sudden onset, constant, ongoing  abdominal pain and R sided flank pain onset 30 minutes prior to arrival. Pain is described as sharp. Currently 9 out of 10. It radiates to his back. No aggravating or alleviating factors reported. Patient does report decreased frequency of bowel movements and hard bowel movements recently. No OTC medications or home remedies attempted prior to arrival. No recent fever, chills, nausea, vomiting, shortness of breath, or chest pain. Pt recently underwent a CABG on March 3. No prior history of history of AAA. Pt takes ASA daily but is not on any other anticoagulants.  PCP: Marton Redwood, MD   CARDIOLOGIST: Sherren Mocha, MD  Past Medical History  Diagnosis Date  . CAD (coronary artery disease)     DES to distal RCA in 09/2004; DES to Ramus Intermediate 10/2009 following abnormal stress test  . Hyperlipidemia   . Hypertension   . GERD (gastroesophageal reflux disease)   . Hiatal hernia   . Gastric polyp   . Esophageal stricture   . Gout   . Benign prostatic hypertrophy   . Obesity   . Fibromyalgia   . Pneumonia     hx of   Past Surgical History  Procedure Laterality Date  . Des to distal rca in 09/2004; des to ramus intermediate 10/2009 following abnormal stress test  2006, 2011  . Cardiac catheterization N/A 03/29/2015    Procedure: Left Heart Cath and Coronary Angiography;  Surgeon:  Sherren Mocha, MD;  Location: [place] [facility];  Service: Cardiovascular;  Laterality: N/A;  . Ultrasound guidance for vascular access  03/29/2015    Procedure: Ultrasound Guidance For Vascular Access;  Surgeon: Sherren Mocha, MD;  Location: [place] [facility];  Service: Cardiovascular;;  . Colonoscopy w/ polypectomy    . Coronary artery bypass graft N/A 04/06/2015    Procedure: CORONARY ARTERY BYPASS GRAFTING (CABG) x three,  using bilateral mammaries, and right leg greater saphenous vein harvested endoscopically;  Surgeon: Gaye Pollack, MD;  Location: [place] [facility];  Service: Open Heart Surgery;  Laterality: N/A;  . Tee without cardioversion N/A 04/06/2015    Procedure: TRANSESOPHAGEAL ECHOCARDIOGRAM (TEE);  Surgeon: Gaye Pollack, MD;  Location: [place];  Service: Open Heart Surgery;  Laterality: N/A;   Family History  Problem Relation Age of Onset  . Heart attack Father   . Lupus Sister   . Diabetes type II Sister   . Colon cancer Neg Hx   . Pancreatic cancer Neg Hx   . Stomach cancer Neg Hx   . COPD Mother    Social History  Substance Use Topics  . Smoking status: Never Smoker   . Smokeless tobacco: Never Used  . Alcohol Use: 3.0 oz/week    5 Glasses of wine per week     Comment: socially    Review of Systems  Constitutional: Negative for fever and chills.  Respiratory: Negative for shortness of breath.   Cardiovascular: Negative for chest pain.  Gastrointestinal: Positive for abdominal pain. Negative for nausea and vomiting.  Genitourinary: Positive for flank pain and difficulty urinating.  Neurological: Negative for headaches.  Psychiatric/Behavioral: Negative for confusion.  All other systems reviewed and are negative.     Allergies  Review of patient's allergies indicates no known allergies.  Home Medications   Prior to Admission medications   Medication Sig Start Date End Date Taking? Authorizing Provider  allopurinol (ZYLOPRIM) 300 MG tablet Take 300 mg by mouth  daily as needed (for gout flare ups).  09/24/11  Yes Lisabeth Pick, MD  amLODipine (NORVASC) 10 MG tablet Take 1 tablet (10 mg total) by mouth daily. 12/19/14  Yes Burtis Junes, NP  ANDROGEL PUMP 20.25 MG/ACT (1.62%) GEL Apply 1 application topically daily. Reported on 05/03/2015 12/05/14  Yes Historical Provider, MD  aspirin EC 325 MG EC tablet Take 1 tablet (325 mg total) by mouth daily. 04/12/15  Yes Wayne E Gold, PA-C  Cholecalciferol (VITAMIN D3) 1000 UNITS CAPS Take 1,000 Units by mouth daily.     Yes Historical Provider, MD  CIALIS 20 MG tablet TAKE ONE TABLET DAILY AS NEEDED FOR     ERECTILE DYSFUNCTION 09/17/10  Yes Lisabeth Pick, MD  Coenzyme Q10 (CO Q 10) 100 MG CAPS Take 100 mg by mouth daily.     Yes Historical Provider, MD  fluticasone (FLONASE) 50 MCG/ACT nasal spray Place 2 sprays into both nostrils daily as needed for allergies or rhinitis.   Yes Historical Provider, MD  lisinopril (PRINIVIL,ZESTRIL) 10 MG tablet Take 1 tablet (10 mg total) by mouth daily. 04/12/15  Yes Erin R Barrett, PA-C  metoprolol succinate (TOPROL-XL) 50 MG 24 hr tablet Take 1 tablet (50 mg total) by mouth 2 (two) times daily. 12/19/14  Yes Sherren Mocha, MD  montelukast (SINGULAIR) 10 MG tablet Take 10 mg by mouth at bedtime.   Yes Historical Provider, MD  omega-3 acid ethyl esters (LOVAZA) 1 g capsule Take 1 g by mouth daily.   Yes Historical Provider, MD  omeprazole (PRILOSEC) 40 MG capsule Take 1 capsule (40 mg total) by mouth daily as needed (acid reflux). 04/25/15  Yes Burtis Junes, NP  oxyCODONE (OXY IR/ROXICODONE) 5 MG immediate release tablet Take 1-2 tablets (5-10 mg total) by mouth every 6 (six) hours as needed for severe pain. Patient taking differently: Take 5-10 mg by mouth every 6 (six) hours as needed for severe pain. Using occasionally 05/02/15  Yes Gaye Pollack, MD  rosuvastatin (CRESTOR) 10 MG tablet Take 1 tablet (10 mg total) by mouth daily. 10/25/10  Yes Lisabeth Pick, MD  polyethylene  glycol powder (GLYCOLAX/MIRALAX) powder Take one capful by mouth daily until stools are loose 05/16/15   Merryl Hacker, MD   Triage Vitals: BP 136/69 mmHg  Pulse 63  Temp(Src) 97.8 F (36.6 C) (Oral)  Resp 15  Ht 5\' 10"  (1.778 m)  Wt 245 lb (111.131 kg)  BMI 35.15 kg/m2  SpO2 96%   Physical Exam  Constitutional: He is oriented to person, place, and time. He appears well-developed and well-nourished. No distress.  Overweight  HENT:  Head: Normocephalic and atraumatic.  Cardiovascular: Normal rate, regular rhythm and normal heart sounds.   No murmur heard. Pulmonary/Chest: Effort normal and breath sounds normal. No respiratory distress. He has no wheezes.  Well-healing midline sternotomy scar  Abdominal: Soft. Bowel sounds are normal. There is no tenderness.  There is no rebound.  Easily reducible umbilical hernia, no significant tenderness to palpation, no CVA tenderness  Musculoskeletal: He exhibits no edema.  Neurological: He is alert and oriented to person, place, and time.  Skin: Skin is warm and dry.  Psychiatric: He has a normal mood and affect.  Nursing note and vitals reviewed.   ED Course  Procedures (including critical care time)  DIAGNOSTIC STUDIES: Oxygen Saturation is 95% on RA, adequate by my interpretation.    COORDINATION OF CARE: 3:22 AM- Will order blood work and urinalysis. Discussed treatment plan with pt at bedside and pt agreed to plan.     Labs Review Labs Reviewed  COMPREHENSIVE METABOLIC PANEL - Abnormal; Notable for the following:    Potassium 3.3 (*)    CO2 21 (*)    Glucose, Bld 144 (*)    All other components within normal limits  CBC - Abnormal; Notable for the following:    WBC 13.3 (*)    All other components within normal limits  LIPASE, BLOOD  URINALYSIS, ROUTINE W REFLEX MICROSCOPIC (NOT AT [facility])  TROPONIN I    Imaging Review Ct Angio Abdomen W/cm &/or Wo Contrast  05/16/2015  CLINICAL DATA:  Sudden onset abdominal pain and  right flank pain. EXAM: CT ANGIOGRAPHY ABDOMEN TECHNIQUE: Multidetector CT imaging of the abdomen was performed using the standard protocol during bolus administration of intravenous contrast. Multiplanar reconstructed images including MIPs were obtained and reviewed to evaluate the vascular anatomy. CONTRAST:  100 mL Isovue 370 intravenous COMPARISON:  None. FINDINGS: The abdominal aorta is normal in caliber, with mild calcified plaque. There is no aortic stenosis. There is no dissection. There is no aneurysm. The celiac axis, superior mesenteric artery and inferior mesenteric artery are widely patent. Each kidney is served by 2 renal arteries. All renal arteries are widely patent. The iliac arteries are widely patent with moderate calcified plaque. Review of the MIP images confirms the above findings. Nonvascular findings: Lower chest: Mild atelectatic appearing linear opacities in both bases. Hepatobiliary: Insert hepatobiliary Pancreas: Normal Spleen: Normal Adrenals/Urinary Tract: The adrenals and kidneys are normal in appearance. There is no urinary calculus evident. There is no hydronephrosis or ureteral dilatation. Collecting systems and ureters appear unremarkable. Stomach/Bowel: There are normal appearances of the stomach, small bowel and appendix. There is extensive colonic diverticulosis. Reproductive: Unremarkable Other: No acute inflammatory changes are evident in the abdomen or pelvis. There is no adenopathy. There is no ascites. Musculoskeletal: No significant skeletal lesion. There is a small fat containing umbilical hernia. There are small fat containing inguinal hernias bilaterally. IMPRESSION: 1. Normal caliber aorta with mild plaque. No dissection. Major branches of the aorta are patent and normal in caliber. 2. No acute findings are evident in the abdomen or pelvis. 3. Colonic diverticulosis. 4. Small fat containing umbilical hernia. Small fat containing inguinal hernias bilaterally.  Electronically Signed   By: Andreas Newport M.D.   On: 05/16/2015 05:02   Dg Chest Portable 1 View  05/16/2015  CLINICAL DATA:  Abdominal pain.  CABG 5 weeks ago. EXAM: PORTABLE CHEST 1 VIEW COMPARISON:  Chest radiograph May 02, 2015; examination interpreted during downtime. FINDINGS: The cardiac silhouette is mildly enlarged unchanged. Mediastinal silhouette is nonsuspicious, status post median sternotomy for CABG. Interstitial prominence without pleural effusion or focal consolidation. Bibasilar strandy densities. No pneumothorax. Soft tissue planes and included osseous structures are nonsuspicious. IMPRESSION: Mild cardiomegaly. Interstitial prominence with bibasilar atelectasis; findings favor atypical infection, less likely pulmonary edema. Electronically Signed  By: Elon Alas M.D.   On: 05/16/2015 06:15   I have personally reviewed and evaluated these images and lab results as part of my medical decision-making.   EKG Interpretation   Date/Time:  Wednesday May 16 2015 03:25:41 EDT Ventricular Rate:  62 PR Interval:  164 QRS Duration: 109 QT Interval:  571 QTC Calculation: 580 R Axis:   87 Text Interpretation:  Sinus rhythm Abnormal inferior Q waves Nonspecific T  abnormalities, lateral leads Prolonged QT interval Confirmed by HORTON   MD, COURTNEY ([id number]) on 05/16/2015 7:30:22 AM      MDM   Final diagnoses:  Generalized abdominal pain    Patient presents with abdominal pain. Acute onset. Radiates to his back and he has right flank pain. Nontoxic on exam. Vital signs reassuring. No significant tenderness on exam. I'm concerned giving the character of the pain and radiation to the back. AAA would certainly be a consideration. EKG, chest x-ray, basic labwork obtained and largely reassuring including urinalysis. CTA of the abdomen is negative for AAA. Patient had improvement of his symptoms with morphine, Zofran. He was also given a GI cocktail. Given patient's recent  history of decreased bowel movements and hard bowel movements, feeling would also be reasonable to trial the patient with a laxative. He is able to tolerate fluids prior to discharge.  After history, exam, and medical workup I feel the patient has been appropriately medically screened and is safe for discharge home. Pertinent diagnoses were discussed with the patient. Patient was given return precautions.   I personally performed the services described in this documentation, which was scribed in my presence. The recorded information has been reviewed and is accurate.   Merryl Hacker, MD 05/16/15 [id number]

## 2015-05-16 NOTE — ED Notes (Signed)
Patient transported to CT scan . 

## 2015-05-16 NOTE — ED Notes (Signed)
The pt is c/o abd pain and rt flank pain for 30 minutes.  He has not felt well all day.  He had a cabg march 3rd

## 2015-05-18 ENCOUNTER — Encounter (HOSPITAL_COMMUNITY): Payer: BLUE CROSS/BLUE SHIELD

## 2015-05-21 ENCOUNTER — Encounter (HOSPITAL_COMMUNITY)
Admission: RE | Admit: 2015-05-21 | Discharge: 2015-05-21 | Disposition: A | Discharge status: Home / Self Care | Payer: BLUE CROSS/BLUE SHIELD | Source: Ambulatory Visit | Attending: Cardiovascular Disease | Admitting: Cardiovascular Disease

## 2015-05-21 DIAGNOSIS — Z951 Presence of aortocoronary bypass graft: Secondary | ICD-10-CM

## 2015-05-21 NOTE — Progress Notes (Signed)
Reviewed home exercise with pt today.  Pt plans to walk on treadmill at [employer]  for exercise.  Reviewed THR, pulse, RPE, sign and symptoms, NTG use, and when to call 911 or MD.  Also discussed weather considerations and indoor options.  Pt voiced understanding.    Leonard Edwards [employer]

## 2015-05-22 ENCOUNTER — Ambulatory Visit (INDEPENDENT_AMBULATORY_CARE_PROVIDER_SITE_OTHER): Payer: BLUE CROSS/BLUE SHIELD | Admitting: Nurse Practitioner

## 2015-05-22 ENCOUNTER — Other Ambulatory Visit: Payer: Self-pay | Admitting: *Deleted

## 2015-05-22 ENCOUNTER — Encounter: Payer: Self-pay | Admitting: Nurse Practitioner

## 2015-05-22 VITALS — BP 130/80 | HR 72 | Ht 70.0 in | Wt 242.0 lb

## 2015-05-22 DIAGNOSIS — Z951 Presence of aortocoronary bypass graft: Secondary | ICD-10-CM | POA: Diagnosis not present

## 2015-05-22 DIAGNOSIS — E785 Hyperlipidemia, unspecified: Secondary | ICD-10-CM

## 2015-05-22 DIAGNOSIS — R7989 Other specified abnormal findings of blood chemistry: Secondary | ICD-10-CM

## 2015-05-22 DIAGNOSIS — I10 Essential (primary) hypertension: Secondary | ICD-10-CM | POA: Diagnosis not present

## 2015-05-22 DIAGNOSIS — R945 Abnormal results of liver function studies: Principal | ICD-10-CM

## 2015-05-22 LAB — LIPID PANEL
Cholesterol: 132 mg/dL (ref 125–200)
HDL: 29 mg/dL — ABNORMAL LOW (ref >=40)
LDL Cholesterol: 79 mg/dL (ref <130)
Total CHOL/HDL Ratio: 4.6 Ratio (ref <=5.0)
Triglycerides: 120 mg/dL (ref <150)
VLDL: 24 mg/dL (ref <30)

## 2015-05-22 LAB — BASIC METABOLIC PANEL
BUN: 9 mg/dL (ref 7–25)
CO2: 27 mmol/L (ref 20–31)
Calcium: 9.4 mg/dL (ref 8.6–10.3)
Chloride: 104 mmol/L (ref 98–110)
Creat: 0.72 mg/dL (ref 0.70–1.33)
Glucose, Bld: 97 mg/dL (ref 65–99)
Potassium: 4.2 mmol/L (ref 3.5–5.3)
Sodium: 140 mmol/L (ref 135–146)

## 2015-05-22 LAB — HEPATIC FUNCTION PANEL
ALT: 112 U/L — ABNORMAL HIGH (ref 9–46)
AST: 28 U/L (ref 10–35)
Albumin: 4.2 g/dL (ref 3.6–5.1)
Alkaline Phosphatase: 198 U/L — ABNORMAL HIGH (ref 40–115)
Bilirubin, Direct: 0.2 mg/dL (ref <=0.2)
Indirect Bilirubin: 0.8 mg/dL (ref 0.2–1.2)
Total Bilirubin: 1 mg/dL (ref 0.2–1.2)
Total Protein: 7.4 g/dL (ref 6.1–8.1)

## 2015-05-22 NOTE — Patient Instructions (Addendum)
We will be checking the following labs today - BMET, lipids and HPF   Medication Instructions:    Continue with your current medicines.     Testing/Procedures To Be Arranged:  N/A  Follow-Up:   See me in 3 months.     Other Special Instructions:   N/A    If you need a refill on your cardiac medications before your next appointment, please call your pharmacy.   Call the [place] office at [phone] if you have any questions, problems or concerns.

## 2015-05-22 NOTE — Progress Notes (Signed)
CARDIOLOGY OFFICE NOTE  Date:  05/22/2015    Leonard Edwards Date of Birth: 24-Jan-1956 Medical Record [id number]  PCP:  Marton Redwood, MD  Cardiologist:  Burt Knack    Chief Complaint  Patient presents with  . Coronary Artery Disease    Follow up visit - seen for Dr. Burt Knack    History of Present Illness: Leonard Edwards is a 60 y.o. male who presents today for a follow up visit. Seen for Dr. Burt Knack.   He has a history of CAD and hypertension. He initially presented several years ago with acute coronary syndrome and underwent PCI to right coronary artery in 2006. He developed recurrent anginal symptoms and underwent stenting of the ramus intermedius branch in 2011.  I have seen him several times over the past year or so - he has recently had more chest pain - updated his Myoview which turned out abnormal and he was referred for cardiac cath - this showed an 80% distal LM stenosis. The ostial and proximal LAD had a 90% stenosis and there was 50% distal LAD stenosis. The RCA has an 80% ostial PDA stenosis. The LVEF was felt to be normal. The Ramus was large with a patent stent. The LCX is a relatively small caliber system with 60% proximal and 70% mid stenoses. He was then referred on for CABG with Dr. Cyndia Bent. He had LIMA to LAD, free RIMA to ramus and SVG to PD.   Post op course complicated by bronchitis and he required antibiotics.   I saw him back last month for his first post op check - he was doing well. Weight down 20 pounds.   In the ER last week with abdominal pain - CTA negative for AAA. Treated with GI cocktail, Zofran and told to take laxative.   Comes back today. Here alone today.  Belly is feeling better. Has stopped his narcotic. Doing cardiac rehab - then goes and walks afterward. He continues to lose weight. Little soreness in his chest and shoulders still but seems to be getting better. Not short of breath. Has not been outside due to the pollen. He is pleased with how  he is doing.   Past Medical History  Diagnosis Date  . CAD (coronary artery disease)     DES to distal RCA in 09/2004; DES to Ramus Intermediate 10/2009 following abnormal stress test  . Hyperlipidemia   . Hypertension   . GERD (gastroesophageal reflux disease)   . Hiatal hernia   . Gastric polyp   . Esophageal stricture   . Gout   . Benign prostatic hypertrophy   . Obesity   . Fibromyalgia   . Pneumonia     hx of    Past Surgical History  Procedure Laterality Date  . Des to distal rca in 09/2004; des to ramus intermediate 10/2009 following abnormal stress test  2006, 2011  . Cardiac catheterization N/A 03/29/2015    Procedure: Left Heart Cath and Coronary Angiography;  Surgeon: Sherren Mocha, MD;  Location: [facility];  Service: Cardiovascular;  Laterality: N/A;  . Ultrasound guidance for vascular access  03/29/2015    Procedure: Ultrasound Guidance For Vascular Access;  Surgeon: Sherren Mocha, MD;  Location: [facility];  Service: Cardiovascular;;  . Colonoscopy w/ polypectomy    . Coronary artery bypass graft N/A 04/06/2015    Procedure: CORONARY ARTERY BYPASS GRAFTING (CABG) x three,  using bilateral mammaries, and right leg greater saphenous vein harvested endoscopically;  Surgeon: Gaye Pollack, MD;  Location: [facility];  Service: Open Heart Surgery;  Laterality: N/A;  . Tee without cardioversion N/A 04/06/2015    Procedure: TRANSESOPHAGEAL ECHOCARDIOGRAM (TEE);  Surgeon: Gaye Pollack, MD;  Location: [place];  Service: Open Heart Surgery;  Laterality: N/A;     Medications: Current Outpatient Prescriptions  Medication Sig Dispense Refill  . allopurinol (ZYLOPRIM) 300 MG tablet Take 300 mg by mouth daily as needed (for gout flare ups).     Marland Kitchen amLODipine (NORVASC) 10 MG tablet Take 1 tablet (10 mg total) by mouth daily. 90 tablet 3  . ANDROGEL PUMP 20.25 MG/ACT (1.62%) GEL Apply 1 application topically daily. Reported on 05/03/2015  4  . aspirin EC 325 MG EC tablet Take  1 tablet (325 mg total) by mouth daily. 30 tablet 0  . Cholecalciferol (VITAMIN D3) 1000 UNITS CAPS Take 1,000 Units by mouth daily.      Marland Kitchen CIALIS 20 MG tablet TAKE ONE TABLET DAILY AS NEEDED FOR     ERECTILE DYSFUNCTION 10 tablet 5  . Coenzyme Q10 (CO Q 10) 100 MG CAPS Take 100 mg by mouth daily.      . fluticasone (FLONASE) 50 MCG/ACT nasal spray Place 2 sprays into both nostrils daily as needed for allergies or rhinitis.    Marland Kitchen lisinopril (PRINIVIL,ZESTRIL) 10 MG tablet Take 1 tablet (10 mg total) by mouth daily. 30 tablet 1  . metoprolol succinate (TOPROL-XL) 50 MG 24 hr tablet Take 1 tablet (50 mg total) by mouth 2 (two) times daily. 60 tablet 11  . montelukast (SINGULAIR) 10 MG tablet Take 10 mg by mouth at bedtime.    Marland Kitchen omega-3 acid ethyl esters (LOVAZA) 1 g capsule Take 1 g by mouth daily.    Marland Kitchen omeprazole (PRILOSEC) 40 MG capsule Take 1 capsule (40 mg total) by mouth daily as needed (acid reflux). 30 capsule 6  . polyethylene glycol powder (GLYCOLAX/MIRALAX) powder Take one capful by mouth daily until stools are loose 255 g 0  . rosuvastatin (CRESTOR) 10 MG tablet Take 1 tablet (10 mg total) by mouth daily. 90 tablet 3  . oxyCODONE (OXY IR/ROXICODONE) 5 MG immediate release tablet Take 1-2 tablets (5-10 mg total) by mouth every 6 (six) hours as needed for severe pain. (Patient not taking: Reported on 05/22/2015) 40 tablet 0   No current facility-administered medications for this visit.    Allergies: No Known Allergies  Social History: The patient  reports that he has never smoked. He has never used smokeless tobacco. He reports that he drinks about 3.0 oz of alcohol per week. He reports that he does not use illicit drugs.   Family History: The patient's family history includes COPD in his mother; Diabetes type II in his sister; Heart attack in his father; Lupus in his sister. There is no history of Colon cancer, Pancreatic cancer, or Stomach cancer.   Review of Systems: Please see the  history of present illness.   Otherwise, the review of systems is positive for none.   All other systems are reviewed and negative.   Physical Exam: VS:  BP 130/80 mmHg  Pulse 72  Ht 5\' 10"  (1.778 m)  Wt 242 lb (109.77 kg)  BMI 34.72 kg/m2 .  BMI Body mass index is 34.72 kg/(m^2).  Wt Readings from Last 3 Encounters:  05/22/15 242 lb (109.77 kg)  05/16/15 245 lb (111.131 kg)  05/02/15 249 lb 12.8 oz (113.309 kg)    General: Pleasant. Well developed,  well nourished and in no acute distress. He has lost 7 more pounds since last visit with me.  HEENT: Normal. Neck: Supple, no JVD, carotid bruits, or masses noted.  Cardiac: Regular rate and rhythm. No murmurs, rubs, or gallops. No edema.  Respiratory:  Lungs are clear to auscultation bilaterally with normal work of breathing.  GI: Soft and nontender.  MS: No deformity or atrophy. Gait and ROM intact. Skin: Warm and dry. Color is normal.  Neuro:  Strength and sensation are intact and no gross focal deficits noted.  Psych: Alert, appropriate and with normal affect.   LABORATORY DATA:  EKG:  EKG is not ordered today.  Lab Results  Component Value Date   WBC 13.3* 05/16/2015   HGB 13.7 05/16/2015   HCT 41.6 05/16/2015   PLT 351 05/16/2015   GLUCOSE 144* 05/16/2015   CHOL 133 07/17/2014   TRIG 121.0 07/17/2014   HDL 34.60* 07/17/2014   LDLDIRECT 68.2 10/25/2010   LDLCALC 74 07/17/2014   ALT 30 05/16/2015   AST 33 05/16/2015   NA 139 05/16/2015   K 3.3* 05/16/2015   CL 105 05/16/2015   CREATININE 0.76 05/16/2015   BUN 11 05/16/2015   CO2 21* 05/16/2015   TSH 1.81 07/17/2014   PSA 1.03 08/29/2009   INR 1.39 04/06/2015   HGBA1C 5.9* 04/04/2015    BNP (last 3 results) No results for input(s): BNP in the last 8760 hours.  ProBNP (last 3 results) No results for input(s): PROBNP in the last 8760 hours.   Other Studies Reviewed Today:   Assessment/Plan: 1. CAD with recent CABG x 3 - he is doing well - continue with  current plan of care.   2. HTN - off HCTZ. BP looks great on his current regimen. May be able to cut back other medicines if BP improves and he continues to be successful with diet/weight loss.   3. HLD - on statin. Ok to resume his fish oil. Needs labs today.   4. Obesity - discussed at length again today. He is doing well with his current plan.   Current medicines are reviewed with the patient today.  The patient does not have concerns regarding medicines other than what has been noted above.  The following changes have been made:  See above.  Labs/ tests ordered today include:    Orders Placed This Encounter  Procedures  . Basic metabolic panel  . Hepatic function panel  . Lipid panel     Disposition:   FU with me in 3 months.   Patient is agreeable to this plan and will call if any problems develop in the interim.   Signed: Burtis Junes, RN, ANP-C 05/22/2015 10:36 AM  [facility] [address] [place], [place]  [address] Phone: [phone] Fax: [phone]

## 2015-05-23 ENCOUNTER — Encounter (HOSPITAL_COMMUNITY)
Admission: RE | Admit: 2015-05-23 | Discharge: 2015-05-23 | Disposition: A | Discharge status: Home / Self Care | Payer: BLUE CROSS/BLUE SHIELD | Source: Ambulatory Visit | Attending: Cardiovascular Disease | Admitting: Cardiovascular Disease

## 2015-05-23 ENCOUNTER — Other Ambulatory Visit (INDEPENDENT_AMBULATORY_CARE_PROVIDER_SITE_OTHER): Payer: BLUE CROSS/BLUE SHIELD

## 2015-05-23 ENCOUNTER — Encounter (HOSPITAL_COMMUNITY): Payer: BLUE CROSS/BLUE SHIELD

## 2015-05-23 ENCOUNTER — Telehealth: Payer: Self-pay

## 2015-05-23 DIAGNOSIS — Z951 Presence of aortocoronary bypass graft: Secondary | ICD-10-CM | POA: Diagnosis not present

## 2015-05-23 DIAGNOSIS — R7989 Other specified abnormal findings of blood chemistry: Secondary | ICD-10-CM | POA: Diagnosis not present

## 2015-05-23 DIAGNOSIS — R945 Abnormal results of liver function studies: Principal | ICD-10-CM

## 2015-05-23 LAB — HEPATIC FUNCTION PANEL
ALT: 80 U/L — ABNORMAL HIGH (ref 9–46)
AST: 23 U/L (ref 10–35)
Albumin: 4 g/dL (ref 3.6–5.1)
Alkaline Phosphatase: 165 U/L — ABNORMAL HIGH (ref 40–115)
Bilirubin, Direct: 0.2 mg/dL (ref <=0.2)
Indirect Bilirubin: 0.6 mg/dL (ref 0.2–1.2)
Total Bilirubin: 0.8 mg/dL (ref 0.2–1.2)
Total Protein: 7.4 g/dL (ref 6.1–8.1)

## 2015-05-23 MED ORDER — AMOXICILLIN 250 MG PO CAPS: 250.0000 mg | ORAL_CAPSULE | Freq: Two times a day (BID) | ORAL | Status: DC

## 2015-05-23 NOTE — Telephone Encounter (Signed)
-----   Message from Irene Shipper, MD sent at 05/23/2015  3:33 PM EDT ----- Regarding: Amoxicillin Leonard Edwards, Mr. Beilke has a recurrent URI. Please call in amoxicillin 250 mg twice a day 10 days. No refills. [place]. Thank you

## 2015-05-23 NOTE — Telephone Encounter (Signed)
Script sent to pharmacy.

## 2015-05-25 ENCOUNTER — Encounter (HOSPITAL_COMMUNITY)
Admission: RE | Admit: 2015-05-25 | Discharge: 2015-05-25 | Disposition: A | Discharge status: Home / Self Care | Payer: BLUE CROSS/BLUE SHIELD | Source: Ambulatory Visit | Attending: Cardiovascular Disease | Admitting: Cardiovascular Disease

## 2015-05-25 DIAGNOSIS — Z951 Presence of aortocoronary bypass graft: Secondary | ICD-10-CM

## 2015-05-28 ENCOUNTER — Encounter (HOSPITAL_COMMUNITY): Payer: BLUE CROSS/BLUE SHIELD

## 2015-05-29 DIAGNOSIS — I25810 Atherosclerosis of coronary artery bypass graft(s) without angina pectoris: Secondary | ICD-10-CM | POA: Diagnosis not present

## 2015-05-29 DIAGNOSIS — I10 Essential (primary) hypertension: Secondary | ICD-10-CM | POA: Diagnosis not present

## 2015-05-29 DIAGNOSIS — J0100 Acute maxillary sinusitis, unspecified: Secondary | ICD-10-CM | POA: Diagnosis not present

## 2015-05-29 DIAGNOSIS — J301 Allergic rhinitis due to pollen: Secondary | ICD-10-CM | POA: Diagnosis not present

## 2015-05-29 NOTE — Progress Notes (Signed)
Cardiac Individual Treatment Plan  Patient Details  Name: Leonard Edwards MRN: [id number] Date of Birth: November 04, 1955 Referring Provider:        CARDIAC REHAB PHASE II EXERCISE from 05/14/2015 in [place]   Referring Provider  Sherren Mocha MD      Initial Encounter Date:       CARDIAC REHAB PHASE II EXERCISE from 05/14/2015 in [place]   Date  05/03/15   Referring Provider  Sherren Mocha MD      Visit Diagnosis: S/P CABG x 3  Patient's Home Medications on Admission:  Current outpatient prescriptions:  .  allopurinol (ZYLOPRIM) 300 MG tablet, Take 300 mg by mouth daily as needed (for gout flare ups). , Disp: , Rfl:  .  amLODipine (NORVASC) 10 MG tablet, Take 1 tablet (10 mg total) by mouth daily., Disp: 90 tablet, Rfl: 3 .  amoxicillin (AMOXIL) 250 MG capsule, Take 1 capsule (250 mg total) by mouth 2 (two) times daily., Disp: 20 capsule, Rfl: 0 .  ANDROGEL PUMP 20.25 MG/ACT (1.62%) GEL, Apply 1 application topically daily. Reported on 05/03/2015, Disp: , Rfl: 4 .  aspirin EC 325 MG EC tablet, Take 1 tablet (325 mg total) by mouth daily., Disp: 30 tablet, Rfl: 0 .  Cholecalciferol (VITAMIN D3) 1000 UNITS CAPS, Take 1,000 Units by mouth daily.  , Disp: , Rfl:  .  CIALIS 20 MG tablet, TAKE ONE TABLET DAILY AS NEEDED FOR     ERECTILE DYSFUNCTION, Disp: 10 tablet, Rfl: 5 .  Coenzyme Q10 (CO Q 10) 100 MG CAPS, Take 100 mg by mouth daily.  , Disp: , Rfl:  .  fluticasone (FLONASE) 50 MCG/ACT nasal spray, Place 2 sprays into both nostrils daily as needed for allergies or rhinitis., Disp: , Rfl:  .  lisinopril (PRINIVIL,ZESTRIL) 10 MG tablet, Take 1 tablet (10 mg total) by mouth daily., Disp: 30 tablet, Rfl: 1 .  metoprolol succinate (TOPROL-XL) 50 MG 24 hr tablet, Take 1 tablet (50 mg total) by mouth 2 (two) times daily., Disp: 60 tablet, Rfl: 11 .  montelukast (SINGULAIR) 10 MG tablet, Take 10 mg by mouth at bedtime., Disp: ,  Rfl:  .  omega-3 acid ethyl esters (LOVAZA) 1 g capsule, Take 1 g by mouth daily., Disp: , Rfl:  .  omeprazole (PRILOSEC) 40 MG capsule, Take 1 capsule (40 mg total) by mouth daily as needed (acid reflux)., Disp: 30 capsule, Rfl: 6 .  oxyCODONE (OXY IR/ROXICODONE) 5 MG immediate release tablet, Take 1-2 tablets (5-10 mg total) by mouth every 6 (six) hours as needed for severe pain. (Patient not taking: Reported on 05/22/2015), Disp: 40 tablet, Rfl: 0 .  polyethylene glycol powder (GLYCOLAX/MIRALAX) powder, Take one capful by mouth daily until stools are loose, Disp: 255 g, Rfl: 0 .  rosuvastatin (CRESTOR) 10 MG tablet, Take 1 tablet (10 mg total) by mouth daily., Disp: 90 tablet, Rfl: 3  Past Medical History: Past Medical History  Diagnosis Date  . CAD (coronary artery disease)     DES to distal RCA in 09/2004; DES to Ramus Intermediate 10/2009 following abnormal stress test  . Hyperlipidemia   . Hypertension   . GERD (gastroesophageal reflux disease)   . Hiatal hernia   . Gastric polyp   . Esophageal stricture   . Gout   . Benign prostatic hypertrophy   . Obesity   . Fibromyalgia   . Pneumonia     hx of  Tobacco Use: History  Smoking status  . Never Smoker   Smokeless tobacco  . Never Used    Labs:     Recent Review Flowsheet Data    Labs for ITP Cardiac and Pulmonary Rehab Latest Ref Rng 04/06/2015 04/06/2015 04/06/2015 04/07/2015 05/22/2015   Cholestrol 125 - 200 mg/dL - - - - 132   LDLCALC <130 mg/dL - - - - 79   HDL >=40 mg/dL - - - - 29(L)   Trlycerides <150 mg/dL - - - - 120   PHART 7.350 - 7.450 7.359 7.311(L) - - -   PCO2ART 35.0 - 45.0 mmHg 42.2 44.0 - - -   HCO3 20.0 - 24.0 mEq/L 23.8 22.2 - - -   TCO2 0 - 100 mmol/L 25 24 24 27  -   ACIDBASEDEF 0.0 - 2.0 mmol/L 2.0 4.0(H) - - -   O2SAT - 91.0 89.0 - - -      Capillary Blood Glucose: Lab Results  Component Value Date   GLUCAP 86 04/08/2015   GLUCAP 97 04/07/2015   GLUCAP 143* 04/07/2015   GLUCAP 111*  04/07/2015   GLUCAP 110* 04/07/2015     Exercise Target Goals:    Exercise Program Goal: Individual exercise prescription set with THRR, safety & activity barriers. Participant demonstrates ability to understand and report RPE using BORG scale, to self-measure pulse accurately, and to acknowledge the importance of the exercise prescription.  Exercise Prescription Goal: Starting with aerobic activity 30 plus minutes a day, 3 days per week for initial exercise prescription. Provide home exercise prescription and guidelines that participant acknowledges understanding prior to discharge.  Activity Barriers & Risk Stratification:     Activity Barriers & Cardiac Risk Stratification - 05/03/15 0845    Activity Barriers & Cardiac Risk Stratification   Activity Barriers None   Cardiac Risk Stratification High      6 Minute Walk:     6 Minute Walk      05/03/15 1027       6 Minute Walk   Phase Initial     Distance 1625 feet     Walk Time 6 minutes     # of Rest Breaks 0     MPH 3     METS 3.3     RPE 7     VO2 Peak 11.67     Symptoms No     Resting HR 67 bpm     Resting BP 134/60 mmHg     Max Ex. HR 70 bpm     Max Ex. BP 138/70 mmHg     2 Minute Post BP 106/64 mmHg     Interval HR   Baseline HR 67     6 Minute HR 70     2 Minute Post HR 61        Initial Exercise Prescription:     Initial Exercise Prescription - 05/17/15 1400    Date of Initial Exercise RX and Referring Provider   Date 05/03/15   Referring Provider Sherren Mocha MD   Treadmill   MPH 2.5   Grade 1   Minutes 10   METs 3.2   Bike   Level 1   Minutes 10   METs 3.2   NuStep   Level 3   Minutes 10   METs 2   Prescription Details   Frequency (times per week) 3   Intensity   THRR 40-80% of Max Heartrate 64-129   Ratings of Perceived Exertion 11-13  Progression   Progression Continue to progress workloads to maintain intensity without signs/symptoms of physical distress.   Resistance  Training   Training Prescription Yes   Weight 2   Reps 10-12      Perform Capillary Blood Glucose checks as needed.  Exercise Prescription Changes:      Exercise Prescription Changes      05/31/15 1100           Exercise Review   Progression Yes       Response to Exercise   Blood Pressure (Admit) 123/80 mmHg       Blood Pressure (Exercise) 140/60 mmHg       Blood Pressure (Exit) 113/73 mmHg       Heart Rate (Admit) 73 bpm       Heart Rate (Exercise) 95 bpm       Heart Rate (Exit) 72 bpm       Rating of Perceived Exertion (Exercise) 12       Resistance Training   Training Prescription Yes       Weight 3lbs       Reps 10-12       Treadmill   MPH 3       Grade 2       Minutes 10       METs 4.12       Bike   Level 1       Minutes 10       METs 2.72       NuStep   Level 4       Minutes 10       METs 2.7       Home Exercise Plan   Plans to continue exercise at Home  walk on treadmill at home or "Club, "Sport time Fitness       Frequency Add 3 additional days to program exercise sessions.          Exercise Comments:      Exercise Comments      05/31/15 1128           Exercise Comments pt is tolerating exercise well, will continue to monitor exercise progression          Discharge Exercise Prescription (Final Exercise Prescription Changes):     Exercise Prescription Changes - 05/31/15 1100    Exercise Review   Progression Yes   Response to Exercise   Blood Pressure (Admit) 123/80 mmHg   Blood Pressure (Exercise) 140/60 mmHg   Blood Pressure (Exit) 113/73 mmHg   Heart Rate (Admit) 73 bpm   Heart Rate (Exercise) 95 bpm   Heart Rate (Exit) 72 bpm   Rating of Perceived Exertion (Exercise) 12   Resistance Training   Training Prescription Yes   Weight 3lbs   Reps 10-12   Treadmill   MPH 3   Grade 2   Minutes 10   METs 4.12   Bike   Level 1   Minutes 10   METs 2.72   NuStep   Level 4   Minutes 10   METs 2.7   Home Exercise Plan    Plans to continue exercise at Home  walk on treadmill at home or "Club, "Sport time Fitness   Frequency Add 3 additional days to program exercise sessions.      Nutrition:  Target Goals: Understanding of nutrition guidelines, daily intake of sodium 1500mg , cholesterol 200mg , calories 30% from fat and 7% or less from saturated fats, daily to have 5  or more servings of fruits and vegetables.  Biometrics:     Pre Biometrics - 05/03/15 1038    Pre Biometrics   Waist Circumference 43 inches   Hip Circumference 43.75 inches   Waist to Hip Ratio 0.98 %   Triceps Skinfold 9 mm   % Body Fat 29.4 %   Grip Strength 45 kg   Single Leg Stand 4.23 seconds       Nutrition Therapy Plan and Nutrition Goals:     Nutrition Therapy & Goals - 05/03/15 1434    Nutrition Therapy   Diet Therapeutic Lifestyle Changes   Personal Nutrition Goals   Personal Goal #1 Weight loss of 6-24 lb at graduation from [facility], educate and counsel regarding individualized specific dietary modifications aiming towards targeted core components such as weight, hypertension, lipid management, diabetes, heart failure and other comorbidities.   Expected Outcomes Short Term Goal: Understand basic principles of dietary content, such as calories, fat, sodium, cholesterol and nutrients.;Long Term Goal: Adherence to prescribed nutrition plan.      Nutrition Discharge: Nutrition Scores:     Nutrition Assessments - 05/14/15 1518    MEDFICTS Scores   Pre Score 33      Nutrition Goals Re-Evaluation:   Psychosocial: Target Goals: Acknowledge presence or absence of depression, maximize coping skills, provide positive support system. Participant is able to verbalize types and ability to use techniques and skills needed for reducing stress and depression.  Initial Review & Psychosocial Screening:     Initial Psych Review & Screening - 05/09/15 [place]? Yes   Screening Interventions   Interventions Encouraged to exercise      Quality of Life Scores:     Quality of Life - 05/03/15 1037    Quality of Life Scores   Health/Function Pre 24 %   Socioeconomic Pre 27.71 %   Psych/Spiritual Pre 27.43 %   Family Pre 30 %   GLOBAL Pre 26.35 %      PHQ-9:     Recent Review Flowsheet Data    Depression screen [facility] 2/9 05/09/2015   Decreased Interest 0   Down, Depressed, Hopeless 0   PHQ - 2 Score 0      Psychosocial Evaluation and Intervention:   Psychosocial Re-Evaluation:   Vocational Rehabilitation: Provide vocational rehab assistance to qualifying candidates.   Vocational Rehab Evaluation & Intervention:     Vocational Rehab - 05/03/15 1358    Initial Vocational Rehab Evaluation & Intervention   Assessment shows need for Vocational Rehabilitation No  Pt indicated that he would be able to go back to work with no difficulty.      Education: Education Goals: Education classes will be provided on a weekly basis, covering required topics. Participant will state understanding/return demonstration of topics presented.  Learning Barriers/Preferences:     Learning Barriers/Preferences - 05/03/15 1057    Learning Barriers/Preferences   Learning Barriers Sight;Hearing   Learning Preferences None      Education Topics: Count Your Pulse:  -Group instruction provided by verbal instruction, demonstration, patient participation and written materials to support subject.  Instructors address importance of being able to find your pulse and how to count your pulse when at home without a heart monitor.  Patients get hands on experience counting their pulse with staff help and individually.      CARDIAC REHAB PHASE II EXERCISE from 05/23/2015 in  [place]   Date  05/11/15   Instruction Review Code  2- meets goals/outcomes      Heart Attack, Angina, and Risk Factor  Modification:  -Group instruction provided by verbal instruction, video, and written materials to support subject.  Instructors address signs and symptoms of angina and heart attacks.    Also discuss risk factors for heart disease and how to make changes to improve heart health risk factors.   Functional Fitness:  -Group instruction provided by verbal instruction, demonstration, patient participation, and written materials to support subject.  Instructors address safety measures for doing things around the house.  Discuss how to get up and down off the floor, how to pick things up properly, how to safely get out of a chair without assistance, and balance training.   Meditation and Mindfulness:  -Group instruction provided by verbal instruction, patient participation, and written materials to support subject.  Instructor addresses importance of mindfulness and meditation practice to help reduce stress and improve awareness.  Instructor also leads participants through a meditation exercise.    Stretching for Flexibility and Mobility:  -Group instruction provided by verbal instruction, patient participation, and written materials to support subject.  Instructors lead participants through series of stretches that are designed to increase flexibility thus improving mobility.  These stretches are additional exercise for major muscle groups that are typically performed during regular warm up and cool down.   Hands Only CPR Anytime:  -Group instruction provided by verbal instruction, video, patient participation and written materials to support subject.  Instructors co-teach with AHA video for hands only CPR.  Participants get hands on experience with mannequins.   Nutrition I class: Heart Healthy Eating:  -Group instruction provided by PowerPoint slides, verbal discussion, and written materials to support subject matter. The instructor gives an explanation and review of the Therapeutic Lifestyle  Changes diet recommendations, which includes a discussion on lipid goals, dietary fat, sodium, fiber, plant stanol/sterol esters, sugar, and the components of a well-balanced, healthy diet.   Nutrition II class: Lifestyle Skills:  -Group instruction provided by PowerPoint slides, verbal discussion, and written materials to support subject matter. The instructor gives an explanation and review of label reading, grocery shopping for heart health, heart healthy recipe modifications, and ways to make healthier choices when eating out.      CARDIAC REHAB PHASE II EXERCISE from 05/23/2015 in [place]   Date  05/15/15   Educator  RD   Instruction Review Code  2- meets goals/outcomes      Diabetes Question & Answer:  -Group instruction provided by PowerPoint slides, verbal discussion, and written materials to support subject matter. The instructor gives an explanation and review of diabetes co-morbidities, pre- and post-prandial blood glucose goals, pre-exercise blood glucose goals, signs, symptoms, and treatment of hypoglycemia and hyperglycemia, and foot care basics.   Diabetes Blitz:  -Group instruction provided by PowerPoint slides, verbal discussion, and written materials to support subject matter. The instructor gives an explanation and review of the physiology behind type 1 and type 2 diabetes, diabetes medications and rational behind using different medications, pre- and post-prandial blood glucose recommendations and Hemoglobin A1c goals, diabetes diet, and exercise including blood glucose guidelines for exercising safely.    Portion Distortion:  -Group instruction provided by PowerPoint slides, verbal discussion, written materials, and food models to support subject matter. The instructor gives an explanation of serving size versus portion size, changes in portions sizes over the  last 20 years, and what consists of a serving from each food group.   Stress  Management:  -Group instruction provided by verbal instruction, video, and written materials to support subject matter.  Instructors review role of stress in heart disease and how to cope with stress positively.        CARDIAC REHAB PHASE II EXERCISE from 05/23/2015 in [place]   Date  05/23/15   Instruction Review Code  2- meets goals/outcomes      Exercising on Your Own:  -Group instruction provided by verbal instruction, power point, and written materials to support subject.  Instructors discuss benefits of exercise, components of exercise, frequency and intensity of exercise, and end points for exercise.  Also discuss use of nitroglycerin and activating EMS.  Review options of places to exercise outside of rehab.  Review guidelines for sex with heart disease.          CARDIAC REHAB PHASE II EXERCISE from 05/23/2015 in [place]   Date  05/09/15   Instruction Review Code  2- meets goals/outcomes      Cardiac Drugs I:  -Group instruction provided by verbal instruction and written materials to support subject.  Instructor reviews cardiac drug classes: antiplatelets, anticoagulants, beta blockers, and statins.  Instructor discusses reasons, side effects, and lifestyle considerations for each drug class.   Cardiac Drugs II:  -Group instruction provided by verbal instruction and written materials to support subject.  Instructor reviews cardiac drug classes: angiotensin converting enzyme inhibitors (ACE-I), angiotensin II receptor blockers (ARBs), nitrates, and calcium channel blockers.  Instructor discusses reasons, side effects, and lifestyle considerations for each drug class.   Anatomy and Physiology of the Circulatory System:  -Group instruction provided by verbal instruction, video, and written materials to support subject.  Reviews functional anatomy of heart, how it relates to various diagnoses, and what role the heart  plays in the overall system.   Knowledge Questionnaire Score:     Knowledge Questionnaire Score - 05/03/15 1031    Knowledge Questionnaire Score   Pre Score 88      Core Components/Risk Factors/Patient Goals at Admission:     Personal Goals and Risk Factors at Admission - 05/03/15 0846    Core Components/Risk Factors/Patient Goals on Admission    Weight Management Yes;Obesity;Weight Loss   Intervention Weight Management: Develop a combined nutrition and exercise program designed to reach desired caloric intake, while maintaining appropriate intake of nutrient and fiber, sodium and fats, and appropriate energy expenditure required for the weight goal.;Weight Management: Provide education and appropriate resources to help participant work on and attain dietary goals.;Weight Management/Obesity: Establish reasonable short term and long term weight goals.;Obesity: Provide education and appropriate resources to help participant work on and attain dietary goals.   Admit Weight 249 lb 12.5 oz (113.3 kg)   Goal Weight: Short Term 239 lb (108.41 kg)   Goal Weight: Long Term 209 lb (94.802 kg)   Expected Outcomes Short Term: Continue to assess and modify interventions until short term weight is achieved;Long Term: Adherence to nutrition and physical activity/exercise program aimed toward attainment of established weight goal;Weight Maintenance: Understanding of the daily nutrition guidelines, which includes 25-35% calories from fat, 7% or less cal from saturated fats, less than 200mg  cholesterol, less than 1.5gm of sodium, & 5 or more servings of fruits and vegetables daily;Weight Loss: Understanding of general recommendations for a balanced deficit meal plan, which promotes 1-2 lb weight loss per week  and includes a negative energy balance of [phone] kcal/d;Understanding recommendations for meals to include 15-35% energy as protein, 25-35% energy from fat, 35-60% energy from carbohydrates, less than  200mg  of dietary cholesterol, 20-35 gm of total fiber daily;Understanding of distribution of calorie intake throughout the day with the consumption of 4-5 meals/snacks   Hypertension Yes   Intervention Provide education on lifestyle modifcations including regular physical activity/exercise, weight management, moderate sodium restriction and increased consumption of fresh fruit, vegetables, and low fat dairy, alcohol moderation, and smoking cessation.;Monitor prescription use compliance.   Expected Outcomes Short Term: Continued assessment and intervention until BP is < 140/70mm HG in hypertensive participants. < 130/54mm HG in hypertensive participants with diabetes, heart failure or chronic kidney disease.;Long Term: Maintenance of blood pressure at goal levels.   Lipids Yes   Intervention Provide education and support for participant on nutrition & aerobic/resistive exercise along with prescribed medications to achieve LDL 70mg , HDL >40mg .   Expected Outcomes Short Term: Participant states understanding of desired cholesterol values and is compliant with medications prescribed. Participant is following exercise prescription and nutrition guidelines.;Long Term: Cholesterol controlled with medications as prescribed, with individualized exercise RX and with personalized nutrition plan. Value goals: LDL < 70mg , HDL > 40 mg.   Stress Yes   Intervention Offer individual and/or small group education and counseling on adjustment to heart disease, stress management and health-related lifestyle change. Teach and support self-help strategies.   Expected Outcomes Long Term: Emotional wellbeing is indicated by absence of clinically significant psychosocial distress or social isolation.;Short Term: Participant demonstrates changes in health-related behavior, relaxation and other stress management skills, ability to obtain effective social support, and compliance with psychotropic medications if prescribed.   Personal  Goal Other Yes   Personal Goal Learn nutrition guidelines, find new normal/routine for gym to continue, increase flexibilty   Intervention Provided nutrition education and exercise prescription in program, Challenge pt with exercise program   Expected Outcomes Adherance to diet and exercise regimine and an increase in sit and reach results      Core Components/Risk Factors/Patient Goals Review:    Core Components/Risk Factors/Patient Goals at Discharge (Final Review):    ITP Comments:     ITP Comments      05/03/15 0845           ITP Comments Medical Director-Dr. Fransico Him, MD          Comments: Pt is making expected progress toward personal goals after completing 8sessions. Recommend continued exercise and life style modification education including  stress management and relaxation techniques to decrease cardiac risk profile.

## 2015-05-30 ENCOUNTER — Encounter (HOSPITAL_COMMUNITY)
Admission: RE | Admit: 2015-05-30 | Discharge: 2015-05-30 | Disposition: A | Discharge status: Home / Self Care | Payer: BLUE CROSS/BLUE SHIELD | Source: Ambulatory Visit | Attending: Cardiovascular Disease | Admitting: Cardiovascular Disease

## 2015-05-30 DIAGNOSIS — Z951 Presence of aortocoronary bypass graft: Secondary | ICD-10-CM | POA: Diagnosis not present

## 2015-06-01 ENCOUNTER — Telehealth (HOSPITAL_COMMUNITY): Payer: Self-pay | Admitting: *Deleted

## 2015-06-01 ENCOUNTER — Encounter (HOSPITAL_COMMUNITY): Admission: RE | Admit: 2015-06-01 | Payer: BLUE CROSS/BLUE SHIELD | Source: Ambulatory Visit

## 2015-06-01 ENCOUNTER — Other Ambulatory Visit: Payer: Self-pay | Admitting: Cardiovascular Disease

## 2015-06-04 ENCOUNTER — Encounter (HOSPITAL_COMMUNITY): Payer: BLUE CROSS/BLUE SHIELD

## 2015-06-06 ENCOUNTER — Encounter (HOSPITAL_COMMUNITY): Payer: BLUE CROSS/BLUE SHIELD

## 2015-06-06 ENCOUNTER — Ambulatory Visit: Payer: BLUE CROSS/BLUE SHIELD | Admitting: Surgery

## 2015-06-08 ENCOUNTER — Encounter (HOSPITAL_COMMUNITY): Payer: BLUE CROSS/BLUE SHIELD

## 2015-06-11 ENCOUNTER — Encounter: Payer: Self-pay | Admitting: *Deleted

## 2015-06-11 ENCOUNTER — Encounter (HOSPITAL_COMMUNITY): Payer: BLUE CROSS/BLUE SHIELD

## 2015-06-13 ENCOUNTER — Ambulatory Visit: Payer: BLUE CROSS/BLUE SHIELD | Admitting: Nurse Practitioner

## 2015-06-13 ENCOUNTER — Encounter (HOSPITAL_COMMUNITY)
Admission: RE | Admit: 2015-06-13 | Discharge: 2015-06-13 | Disposition: A | Discharge status: Home / Self Care | Payer: BLUE CROSS/BLUE SHIELD | Source: Ambulatory Visit | Attending: Cardiovascular Disease | Admitting: Cardiovascular Disease

## 2015-06-13 ENCOUNTER — Ambulatory Visit (INDEPENDENT_AMBULATORY_CARE_PROVIDER_SITE_OTHER): Payer: Self-pay | Admitting: Surgery

## 2015-06-13 ENCOUNTER — Encounter: Payer: Self-pay | Admitting: Surgery

## 2015-06-13 VITALS — BP 112/75 | HR 72 | Resp 20 | Ht 70.0 in | Wt 242.0 lb

## 2015-06-13 DIAGNOSIS — Z951 Presence of aortocoronary bypass graft: Secondary | ICD-10-CM | POA: Diagnosis not present

## 2015-06-13 DIAGNOSIS — I2510 Atherosclerotic heart disease of native coronary artery without angina pectoris: Secondary | ICD-10-CM

## 2015-06-13 NOTE — Progress Notes (Signed)
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 The patient's early postoperative recovery while in the hospital was notable for an uncomplicated postop course. I saw him for his first postop visit on 05/04/2015 and he was doing well. He says that he felt great last week but this week his allergies have been bad with sinus drainage and cough. He also reports a lot of diffuse joint and muscle pains that have been limiting his activity.   Current Outpatient Prescriptions  Medication Sig Dispense Refill  . allopurinol (ZYLOPRIM) 300 MG tablet Take 300 mg by mouth daily as needed (for gout flare ups).     Marland Kitchen amLODipine (NORVASC) 10 MG tablet Take 1 tablet (10 mg total) by mouth daily. 90 tablet 3  . amoxicillin (AMOXIL) 250 MG capsule Take 1 capsule (250 mg total) by mouth 2 (two) times daily. 20 capsule 0  . ANDROGEL PUMP 20.25 MG/ACT (1.62%) GEL Apply 1 application topically daily. Reported on 05/03/2015  4  . aspirin EC 325 MG EC tablet Take 1 tablet (325 mg total) by mouth daily. 30 tablet 0  . Cholecalciferol (VITAMIN D3) 1000 UNITS CAPS Take 1,000 Units by mouth daily.      Marland Kitchen CIALIS 20 MG tablet TAKE ONE TABLET DAILY AS NEEDED FOR     ERECTILE DYSFUNCTION 10 tablet 5  . Coenzyme Q10 (CO Q 10) 100 MG CAPS Take 100 mg by mouth daily.      . fluticasone (FLONASE) 50 MCG/ACT nasal spray Place 2 sprays into both nostrils daily as needed for allergies or rhinitis.    Marland Kitchen lisinopril (PRINIVIL,ZESTRIL) 10 MG tablet TAKE 1 TABLET BY MOUTH DAILY 30 tablet 5  . metoprolol succinate (TOPROL-XL) 50 MG 24 hr tablet Take 1 tablet (50 mg total) by mouth 2 (two) times daily. 60 tablet 11  . montelukast (SINGULAIR) 10 MG tablet Take 10 mg by mouth at bedtime.    Marland Kitchen omega-3 acid ethyl esters (LOVAZA) 1 g capsule Take 1 g by mouth daily.    Marland Kitchen omeprazole (PRILOSEC) 40 MG capsule Take 1 capsule (40 mg total) by mouth daily as needed (acid reflux). 30 capsule 6  .  oxyCODONE (OXY IR/ROXICODONE) 5 MG immediate release tablet Take 1-2 tablets (5-10 mg total) by mouth every 6 (six) hours as needed for severe pain. (Patient not taking: Reported on 05/22/2015) 40 tablet 0  . polyethylene glycol powder (GLYCOLAX/MIRALAX) powder Take one capful by mouth daily until stools are loose 255 g 0  . rosuvastatin (CRESTOR) 10 MG tablet Take 1 tablet (10 mg total) by mouth daily. 90 tablet 3   No current facility-administered medications for this visit.     Physical Exam: BP 112/75 mmHg  Pulse 72  Resp 20  Ht 5\' 10"  (1.778 m)  Wt 242 lb (109.77 kg)  BMI 34.72 kg/m2  SpO2 98% He looks well. Lung exam is clear. Cardiac exam shows a regular rate and rhythm with normal heart sounds. Chest incision is healing well and sternum is stable. The leg incisions are healing well and there is trace ankle edema bilat.   Diagnostic Tests:  None today  Impression:  Overall I think he is doing well from a cardiac point of view. He is able to walk at a good pace without chest pain or shortness of breath. He seems mainly limited by his joint and muscle pains which may be due to the Crestor he is on.  He says that he was on this previously but he has noticed these symptoms in the past. I think he should probably stop this and see how he does for a while before considering a different statin. He has a controlled LDL of 79 and a low HDL of 29. He is scheduled to see an allergist in the next week or so. I told him that he can return to normal activity in about 4 more weeks.   Plan:  He will continue to follow up with cardiology and will return to see me if he has any problems with his incisions. He will discuss stopping the Crestor with cardiology.    Gaye Pollack, MD [facility] [phone]

## 2015-06-15 ENCOUNTER — Encounter (HOSPITAL_COMMUNITY): Payer: BLUE CROSS/BLUE SHIELD

## 2015-06-18 ENCOUNTER — Encounter (HOSPITAL_COMMUNITY): Payer: BLUE CROSS/BLUE SHIELD

## 2015-06-19 ENCOUNTER — Telehealth: Payer: Self-pay | Admitting: Nurse Practitioner

## 2015-06-19 ENCOUNTER — Other Ambulatory Visit: Payer: Self-pay | Admitting: *Deleted

## 2015-06-19 DIAGNOSIS — R748 Abnormal levels of other serum enzymes: Secondary | ICD-10-CM

## 2015-06-19 NOTE — Telephone Encounter (Signed)
Pt calling re cardiac rehab that he is in-needs your opinion on something

## 2015-06-19 NOTE — Telephone Encounter (Signed)
Spoke to Leonard Edwards,  He stopped his Crestor over the past few days. Told him we have been trying for several weeks to get back in touch with him for repeat LFTs. Will get done this Thursday.   He is feeling ok clinically.   He is not really enjoying the pace of cardiac rehab. I told him it was ok for him to stop going.

## 2015-06-20 ENCOUNTER — Encounter (HOSPITAL_COMMUNITY): Payer: BLUE CROSS/BLUE SHIELD

## 2015-06-20 DIAGNOSIS — R05 Cough: Secondary | ICD-10-CM | POA: Diagnosis not present

## 2015-06-20 DIAGNOSIS — J4530 Mild persistent asthma, uncomplicated: Secondary | ICD-10-CM | POA: Diagnosis not present

## 2015-06-20 DIAGNOSIS — J3081 Allergic rhinitis due to animal (cat) (dog) hair and dander: Secondary | ICD-10-CM | POA: Diagnosis not present

## 2015-06-20 DIAGNOSIS — J3089 Other allergic rhinitis: Secondary | ICD-10-CM | POA: Diagnosis not present

## 2015-06-21 ENCOUNTER — Other Ambulatory Visit: Payer: BLUE CROSS/BLUE SHIELD

## 2015-06-22 ENCOUNTER — Encounter (HOSPITAL_COMMUNITY)
Admission: RE | Admit: 2015-06-22 | Discharge: 2015-06-22 | Disposition: A | Discharge status: Home / Self Care | Payer: BLUE CROSS/BLUE SHIELD | Source: Ambulatory Visit | Attending: Cardiovascular Disease | Admitting: Cardiovascular Disease

## 2015-06-22 NOTE — Progress Notes (Signed)
Cardiac Individual Treatment Plan  Patient Details  Name: Leonard Edwards MRN: [id number] Date of Birth: 15-Sep-1955 Referring Provider:        CARDIAC REHAB PHASE II EXERCISE from 05/14/2015 in [place]   Referring Provider  Sherren Mocha MD      Initial Encounter Date:       CARDIAC REHAB PHASE II EXERCISE from 05/14/2015 in [place]   Date  05/03/15   Referring Provider  Sherren Mocha MD      Visit Diagnosis: No diagnosis found.  Patient's Home Medications on Admission:  Current outpatient prescriptions:  .  allopurinol (ZYLOPRIM) 300 MG tablet, Take 300 mg by mouth daily as needed (for gout flare ups). , Disp: , Rfl:  .  amLODipine (NORVASC) 10 MG tablet, Take 1 tablet (10 mg total) by mouth daily., Disp: 90 tablet, Rfl: 3 .  amoxicillin (AMOXIL) 250 MG capsule, Take 1 capsule (250 mg total) by mouth 2 (two) times daily., Disp: 20 capsule, Rfl: 0 .  ANDROGEL PUMP 20.25 MG/ACT (1.62%) GEL, Apply 1 application topically daily. Reported on 05/03/2015, Disp: , Rfl: 4 .  aspirin EC 325 MG EC tablet, Take 1 tablet (325 mg total) by mouth daily., Disp: 30 tablet, Rfl: 0 .  Cholecalciferol (VITAMIN D3) 1000 UNITS CAPS, Take 1,000 Units by mouth daily.  , Disp: , Rfl:  .  CIALIS 20 MG tablet, TAKE ONE TABLET DAILY AS NEEDED FOR     ERECTILE DYSFUNCTION, Disp: 10 tablet, Rfl: 5 .  Coenzyme Q10 (CO Q 10) 100 MG CAPS, Take 100 mg by mouth daily.  , Disp: , Rfl:  .  fluticasone (FLONASE) 50 MCG/ACT nasal spray, Place 2 sprays into both nostrils daily as needed for allergies or rhinitis., Disp: , Rfl:  .  lisinopril (PRINIVIL,ZESTRIL) 10 MG tablet, TAKE 1 TABLET BY MOUTH DAILY, Disp: 30 tablet, Rfl: 5 .  metoprolol succinate (TOPROL-XL) 50 MG 24 hr tablet, Take 1 tablet (50 mg total) by mouth 2 (two) times daily., Disp: 60 tablet, Rfl: 11 .  montelukast (SINGULAIR) 10 MG tablet, Take 10 mg by mouth at bedtime., Disp: , Rfl:  .   omega-3 acid ethyl esters (LOVAZA) 1 g capsule, Take 1 g by mouth daily., Disp: , Rfl:  .  omeprazole (PRILOSEC) 40 MG capsule, Take 1 capsule (40 mg total) by mouth daily as needed (acid reflux)., Disp: 30 capsule, Rfl: 6 .  oxyCODONE (OXY IR/ROXICODONE) 5 MG immediate release tablet, Take 1-2 tablets (5-10 mg total) by mouth every 6 (six) hours as needed for severe pain. (Patient not taking: Reported on 05/22/2015), Disp: 40 tablet, Rfl: 0 .  polyethylene glycol powder (GLYCOLAX/MIRALAX) powder, Take one capful by mouth daily until stools are loose, Disp: 255 g, Rfl: 0 .  rosuvastatin (CRESTOR) 10 MG tablet, Take 1 tablet (10 mg total) by mouth daily., Disp: 90 tablet, Rfl: 3  Past Medical History: Past Medical History  Diagnosis Date  . CAD (coronary artery disease)     DES to distal RCA in 09/2004; DES to [place] Intermediate 10/2009 following abnormal stress test  . Hyperlipidemia   . Hypertension   . GERD (gastroesophageal reflux disease)   . Hiatal hernia   . Gastric polyp   . Esophageal stricture   . Gout   . Benign prostatic hypertrophy   . Obesity   . Fibromyalgia   . Pneumonia     hx of    Tobacco Use:  History  Smoking status  . Never Smoker   Smokeless tobacco  . Never Used    Labs: Recent Review Flowsheet Data    Labs for ITP Cardiac and Pulmonary Rehab Latest Ref Rng 04/06/2015 04/06/2015 04/06/2015 04/07/2015 05/22/2015   Cholestrol 125 - 200 mg/dL - - - - 132   LDLCALC <130 mg/dL - - - - 79   HDL >=40 mg/dL - - - - 29(L)   Trlycerides <150 mg/dL - - - - 120   PHART 7.350 - 7.450 7.359 7.311(L) - - -   PCO2ART 35.0 - 45.0 mmHg 42.2 44.0 - - -   HCO3 20.0 - 24.0 mEq/L 23.8 22.2 - - -   TCO2 0 - 100 mmol/L 25 24 24 27  -   ACIDBASEDEF 0.0 - 2.0 mmol/L 2.0 4.0(H) - - -   O2SAT - 91.0 89.0 - - -      Capillary Blood Glucose: Lab Results  Component Value Date   GLUCAP 86 04/08/2015   GLUCAP 97 04/07/2015   GLUCAP 143* 04/07/2015   GLUCAP 111* 04/07/2015   GLUCAP  110* 04/07/2015     Exercise Target Goals:    Exercise Program Goal: Individual exercise prescription set with THRR, safety & activity barriers. Participant demonstrates ability to understand and report RPE using BORG scale, to self-measure pulse accurately, and to acknowledge the importance of the exercise prescription.  Exercise Prescription Goal: Starting with aerobic activity 30 plus minutes a day, 3 days per week for initial exercise prescription. Provide home exercise prescription and guidelines that participant acknowledges understanding prior to discharge.  Activity Barriers & Risk Stratification:     Activity Barriers & Cardiac Risk Stratification - 05/03/15 0845    Activity Barriers & Cardiac Risk Stratification   Activity Barriers None   Cardiac Risk Stratification High      6 Minute Walk:     6 Minute Walk      05/03/15 1027       6 Minute Walk   Phase Initial     Distance 1625 feet     Walk Time 6 minutes     # of Rest Breaks 0     MPH 3     METS 3.3     RPE 7     VO2 Peak 11.67     Symptoms No     Resting HR 67 bpm     Resting BP 134/60 mmHg     Max Ex. HR 70 bpm     Max Ex. BP 138/70 mmHg     2 Minute Post BP 106/64 mmHg     Interval HR   Baseline HR 67     6 Minute HR 70     2 Minute Post HR 61        Initial Exercise Prescription:     Initial Exercise Prescription - 05/17/15 1400    Date of Initial Exercise RX and Referring Provider   Date 05/03/15   Referring Provider Sherren Mocha MD   Treadmill   MPH 2.5   Grade 1   Minutes 10   METs 3.2   Bike   Level 1   Minutes 10   METs 3.2   NuStep   Level 3   Minutes 10   METs 2   Prescription Details   Frequency (times per week) 3   Intensity   THRR 40-80% of Max Heartrate 64-129   Ratings of Perceived Exertion 11-13   Progression   Progression  Continue to progress workloads to maintain intensity without signs/symptoms of physical distress.   Resistance Training   Training  Prescription Yes   Weight 2   Reps 10-12      Perform Capillary Blood Glucose checks as needed.  Exercise Prescription Changes:     Exercise Prescription Changes      05/31/15 1100           Exercise Review   Progression Yes       Response to Exercise   Blood Pressure (Admit) 123/80 mmHg       Blood Pressure (Exercise) 140/60 mmHg       Blood Pressure (Exit) 113/73 mmHg       Heart Rate (Admit) 73 bpm       Heart Rate (Exercise) 95 bpm       Heart Rate (Exit) 72 bpm       Rating of Perceived Exertion (Exercise) 12       Resistance Training   Training Prescription Yes       Weight 3lbs       Reps 10-12       Treadmill   MPH 3       Grade 2       Minutes 10       METs 4.12       Bike   Level 1       Minutes 10       METs 2.72       NuStep   Level 4       Minutes 10       METs 2.7       Home Exercise Plan   Plans to continue exercise at Home  walk on treadmill at home or "Club, "Sport time Fitness       Frequency Add 3 additional days to program exercise sessions.          Exercise Comments:     Exercise Comments      05/31/15 1128           Exercise Comments pt is tolerating exercise well, will continue to monitor exercise progression          Discharge Exercise Prescription (Final Exercise Prescription Changes):     Exercise Prescription Changes - 05/31/15 1100    Exercise Review   Progression Yes   Response to Exercise   Blood Pressure (Admit) 123/80 mmHg   Blood Pressure (Exercise) 140/60 mmHg   Blood Pressure (Exit) 113/73 mmHg   Heart Rate (Admit) 73 bpm   Heart Rate (Exercise) 95 bpm   Heart Rate (Exit) 72 bpm   Rating of Perceived Exertion (Exercise) 12   Resistance Training   Training Prescription Yes   Weight 3lbs   Reps 10-12   Treadmill   MPH 3   Grade 2   Minutes 10   METs 4.12   Bike   Level 1   Minutes 10   METs 2.72   NuStep   Level 4   Minutes 10   METs 2.7   Home Exercise Plan   Plans to continue exercise  at Home  walk on treadmill at home or "Club, "Sport time Fitness   Frequency Add 3 additional days to program exercise sessions.      Nutrition:  Target Goals: Understanding of nutrition guidelines, daily intake of sodium 1500mg , cholesterol 200mg , calories 30% from fat and 7% or less from saturated fats, daily to have 5 or more servings of fruits and  vegetables.  Biometrics:     Pre Biometrics - 05/03/15 1038    Pre Biometrics   Waist Circumference 43 inches   Hip Circumference 43.75 inches   Waist to Hip Ratio 0.98 %   Triceps Skinfold 9 mm   % Body Fat 29.4 %   Grip Strength 45 kg   Single Leg Stand 4.23 seconds       Nutrition Therapy Plan and Nutrition Goals:     Nutrition Therapy & Goals - 05/03/15 1434    Nutrition Therapy   Diet Therapeutic Lifestyle Changes   Personal Nutrition Goals   Personal Goal #1 Weight loss of 6-24 lb at graduation from [place], educate and counsel regarding individualized specific dietary modifications aiming towards targeted core components such as weight, hypertension, lipid management, diabetes, heart failure and other comorbidities.   Expected Outcomes Short Term Goal: Understand basic principles of dietary content, such as calories, fat, sodium, cholesterol and nutrients.;Long Term Goal: Adherence to prescribed nutrition plan.      Nutrition Discharge: Nutrition Scores:     Nutrition Assessments - 05/14/15 1518    MEDFICTS Scores   Pre Score 33      Nutrition Goals Re-Evaluation:   Psychosocial: Target Goals: Acknowledge presence or absence of depression, maximize coping skills, provide positive support system. Participant is able to verbalize types and ability to use techniques and skills needed for reducing stress and depression.  Initial Review & Psychosocial Screening:     Initial Psych Review & Screening - 05/09/15 [place]?  Yes   Screening Interventions   Interventions Encouraged to exercise      Quality of Life Scores:     Quality of Life - 05/03/15 1037    Quality of Life Scores   Health/Function Pre 24 %   Socioeconomic Pre 27.71 %   Psych/Spiritual Pre 27.43 %   Family Pre 30 %   GLOBAL Pre 26.35 %      PHQ-9:     Recent Review Flowsheet Data    Depression screen [facility] 2/9 05/09/2015   Decreased Interest 0   Down, Depressed, Hopeless 0   PHQ - 2 Score 0      Psychosocial Evaluation and Intervention:   Psychosocial Re-Evaluation:   Vocational Rehabilitation: Provide vocational rehab assistance to qualifying candidates.   Vocational Rehab Evaluation & Intervention:     Vocational Rehab - 05/03/15 1358    Initial Vocational Rehab Evaluation & Intervention   Assessment shows need for Vocational Rehabilitation No  Pt indicated that he would be able to go back to work with no difficulty.      Education: Education Goals: Education classes will be provided on a weekly basis, covering required topics. Participant will state understanding/return demonstration of topics presented.  Learning Barriers/Preferences:     Learning Barriers/Preferences - 05/03/15 1057    Learning Barriers/Preferences   Learning Barriers Sight;Hearing   Learning Preferences None      Education Topics: Count Your Pulse:  -Group instruction provided by verbal instruction, demonstration, patient participation and written materials to support subject.  Instructors address importance of being able to find your pulse and how to count your pulse when at home without a heart monitor.  Patients get hands on experience counting their pulse with staff help and individually.          CARDIAC REHAB PHASE II EXERCISE from 05/30/2015 in [place]  [place]   Date  05/11/15   Instruction Review Code  2- meets goals/outcomes      Heart Attack, Angina, and Risk Factor Modification:  -Group  instruction provided by verbal instruction, video, and written materials to support subject.  Instructors address signs and symptoms of angina and heart attacks.    Also discuss risk factors for heart disease and how to make changes to improve heart health risk factors.   Functional Fitness:  -Group instruction provided by verbal instruction, demonstration, patient participation, and written materials to support subject.  Instructors address safety measures for doing things around the house.  Discuss how to get up and down off the floor, how to pick things up properly, how to safely get out of a chair without assistance, and balance training.   Meditation and Mindfulness:  -Group instruction provided by verbal instruction, patient participation, and written materials to support subject.  Instructor addresses importance of mindfulness and meditation practice to help reduce stress and improve awareness.  Instructor also leads participants through a meditation exercise.    Stretching for Flexibility and Mobility:  -Group instruction provided by verbal instruction, patient participation, and written materials to support subject.  Instructors lead participants through series of stretches that are designed to increase flexibility thus improving mobility.  These stretches are additional exercise for major muscle groups that are typically performed during regular warm up and cool down.   Hands Only CPR Anytime:  -Group instruction provided by verbal instruction, video, patient participation and written materials to support subject.  Instructors co-teach with AHA video for hands only CPR.  Participants get hands on experience with mannequins.   Nutrition I class: Heart Healthy Eating:  -Group instruction provided by PowerPoint slides, verbal discussion, and written materials to support subject matter. The instructor gives an explanation and review of the Therapeutic Lifestyle Changes diet recommendations,  which includes a discussion on lipid goals, dietary fat, sodium, fiber, plant stanol/sterol esters, sugar, and the components of a well-balanced, healthy diet.   Nutrition II class: Lifestyle Skills:  -Group instruction provided by PowerPoint slides, verbal discussion, and written materials to support subject matter. The instructor gives an explanation and review of label reading, grocery shopping for heart health, heart healthy recipe modifications, and ways to make healthier choices when eating out.      CARDIAC REHAB PHASE II EXERCISE from 05/30/2015 in [place]   Date  05/15/15   Educator  RD   Instruction Review Code  2- meets goals/outcomes      Diabetes Question & Answer:  -Group instruction provided by PowerPoint slides, verbal discussion, and written materials to support subject matter. The instructor gives an explanation and review of diabetes co-morbidities, pre- and post-prandial blood glucose goals, pre-exercise blood glucose goals, signs, symptoms, and treatment of hypoglycemia and hyperglycemia, and foot care basics.   Diabetes Blitz:  -Group instruction provided by PowerPoint slides, verbal discussion, and written materials to support subject matter. The instructor gives an explanation and review of the physiology behind type 1 and type 2 diabetes, diabetes medications and rational behind using different medications, pre- and post-prandial blood glucose recommendations and Hemoglobin A1c goals, diabetes diet, and exercise including blood glucose guidelines for exercising safely.    Portion Distortion:  -Group instruction provided by PowerPoint slides, verbal discussion, written materials, and food models to support subject matter. The instructor gives an explanation of serving size versus portion size, changes in portions sizes over the last 20  years, and what consists of a serving from each food group.      CARDIAC REHAB PHASE II EXERCISE from  05/30/2015 in [place]   Date  05/30/15   Educator  RD   Instruction Review Code  2- meets goals/outcomes      Stress Management:  -Group instruction provided by verbal instruction, video, and written materials to support subject matter.  Instructors review role of stress in heart disease and how to cope with stress positively.        CARDIAC REHAB PHASE II EXERCISE from 05/30/2015 in [place]   Date  05/23/15   Instruction Review Code  2- meets goals/outcomes      Exercising on Your Own:  -Group instruction provided by verbal instruction, power point, and written materials to support subject.  Instructors discuss benefits of exercise, components of exercise, frequency and intensity of exercise, and end points for exercise.  Also discuss use of nitroglycerin and activating EMS.  Review options of places to exercise outside of rehab.  Review guidelines for sex with heart disease.      CARDIAC REHAB PHASE II EXERCISE from 05/30/2015 in [place]   Date  05/09/15   Instruction Review Code  2- meets goals/outcomes      Cardiac Drugs I:  -Group instruction provided by verbal instruction and written materials to support subject.  Instructor reviews cardiac drug classes: antiplatelets, anticoagulants, beta blockers, and statins.  Instructor discusses reasons, side effects, and lifestyle considerations for each drug class.   Cardiac Drugs II:  -Group instruction provided by verbal instruction and written materials to support subject.  Instructor reviews cardiac drug classes: angiotensin converting enzyme inhibitors (ACE-I), angiotensin II receptor blockers (ARBs), nitrates, and calcium channel blockers.  Instructor discusses reasons, side effects, and lifestyle considerations for each drug class.   Anatomy and Physiology of the Circulatory System:  -Group instruction provided by verbal  instruction, video, and written materials to support subject.  Reviews functional anatomy of heart, how it relates to various diagnoses, and what role the heart plays in the overall system.   Knowledge Questionnaire Score:     Knowledge Questionnaire Score - 05/03/15 1031    Knowledge Questionnaire Score   Pre Score 88      Core Components/Risk Factors/Patient Goals at Admission:     Personal Goals and Risk Factors at Admission - 05/03/15 0846    Core Components/Risk Factors/Patient Goals on Admission    Weight Management Yes;Obesity;Weight Loss   Intervention Weight Management: Develop a combined nutrition and exercise program designed to reach desired caloric intake, while maintaining appropriate intake of nutrient and fiber, sodium and fats, and appropriate energy expenditure required for the weight goal.;Weight Management: Provide education and appropriate resources to help participant work on and attain dietary goals.;Weight Management/Obesity: Establish reasonable short term and long term weight goals.;Obesity: Provide education and appropriate resources to help participant work on and attain dietary goals.   Admit Weight 249 lb 12.5 oz (113.3 kg)   Goal Weight: Short Term 239 lb (108.41 kg)   Goal Weight: Long Term 209 lb (94.802 kg)   Expected Outcomes Short Term: Continue to assess and modify interventions until short term weight is achieved;Long Term: Adherence to nutrition and physical activity/exercise program aimed toward attainment of established weight goal;Weight Maintenance: Understanding of the daily nutrition guidelines, which includes 25-35% calories from fat, 7% or less cal from saturated fats, less than 200mg   cholesterol, less than 1.5gm of sodium, & 5 or more servings of fruits and vegetables daily;Weight Loss: Understanding of general recommendations for a balanced deficit meal plan, which promotes 1-2 lb weight loss per week and includes a negative energy balance of  [phone] kcal/d;Understanding recommendations for meals to include 15-35% energy as protein, 25-35% energy from fat, 35-60% energy from carbohydrates, less than 200mg  of dietary cholesterol, 20-35 gm of total fiber daily;Understanding of distribution of calorie intake throughout the day with the consumption of 4-5 meals/snacks   Hypertension Yes   Intervention Provide education on lifestyle modifcations including regular physical activity/exercise, weight management, moderate sodium restriction and increased consumption of fresh fruit, vegetables, and low fat dairy, alcohol moderation, and smoking cessation.;Monitor prescription use compliance.   Expected Outcomes Short Term: Continued assessment and intervention until BP is < 140/15mm HG in hypertensive participants. < 130/46mm HG in hypertensive participants with diabetes, heart failure or chronic kidney disease.;Long Term: Maintenance of blood pressure at goal levels.   Lipids Yes   Intervention Provide education and support for participant on nutrition & aerobic/resistive exercise along with prescribed medications to achieve LDL 70mg , HDL >40mg .   Expected Outcomes Short Term: Participant states understanding of desired cholesterol values and is compliant with medications prescribed. Participant is following exercise prescription and nutrition guidelines.;Long Term: Cholesterol controlled with medications as prescribed, with individualized exercise RX and with personalized nutrition plan. Value goals: LDL < 70mg , HDL > 40 mg.   Stress Yes   Intervention Offer individual and/or small group education and counseling on adjustment to heart disease, stress management and health-related lifestyle change. Teach and support self-help strategies.   Expected Outcomes Long Term: Emotional wellbeing is indicated by absence of clinically significant psychosocial distress or social isolation.;Short Term: Participant demonstrates changes in health-related behavior,  relaxation and other stress management skills, ability to obtain effective social support, and compliance with psychotropic medications if prescribed.   Personal Goal Other Yes   Personal Goal Learn nutrition guidelines, find new normal/routine for gym to continue, increase flexibilty   Intervention Provided nutrition education and exercise prescription in program, Challenge pt with exercise program   Expected Outcomes Adherance to diet and exercise regimine and an increase in sit and reach results      Core Components/Risk Factors/Patient Goals Review:    Core Components/Risk Factors/Patient Goals at Discharge (Final Review):    ITP Comments:     ITP Comments      05/03/15 0845           ITP Comments Medical Director-Dr. Fransico Him, MD          Comments: Pt is not making expected progress after attending 9 sessions. Pt has had extended absences for personal reasons.  Pt continued participation and expected compliance unknown. Recommend continued exercise and life style modification education including  stress management and relaxation techniques to decrease cardiac risk profile.

## 2015-06-25 ENCOUNTER — Other Ambulatory Visit (INDEPENDENT_AMBULATORY_CARE_PROVIDER_SITE_OTHER): Payer: BLUE CROSS/BLUE SHIELD

## 2015-06-25 ENCOUNTER — Encounter (HOSPITAL_COMMUNITY)
Admission: RE | Admit: 2015-06-25 | Discharge: 2015-06-25 | Disposition: A | Discharge status: Home / Self Care | Payer: BLUE CROSS/BLUE SHIELD | Source: Ambulatory Visit | Attending: Cardiovascular Disease | Admitting: Cardiovascular Disease

## 2015-06-25 DIAGNOSIS — R748 Abnormal levels of other serum enzymes: Secondary | ICD-10-CM | POA: Diagnosis not present

## 2015-06-25 LAB — HEPATIC FUNCTION PANEL
ALT: 25 U/L (ref 9–46)
AST: 22 U/L (ref 10–35)
Albumin: 4.1 g/dL (ref 3.6–5.1)
Alkaline Phosphatase: 99 U/L (ref 40–115)
Bilirubin, Direct: 0.1 mg/dL (ref <=0.2)
Indirect Bilirubin: 0.4 mg/dL (ref 0.2–1.2)
Total Bilirubin: 0.5 mg/dL (ref 0.2–1.2)
Total Protein: 7 g/dL (ref 6.1–8.1)

## 2015-06-27 ENCOUNTER — Encounter (HOSPITAL_COMMUNITY): Payer: BLUE CROSS/BLUE SHIELD

## 2015-06-29 ENCOUNTER — Encounter (HOSPITAL_COMMUNITY): Payer: BLUE CROSS/BLUE SHIELD

## 2015-07-04 ENCOUNTER — Encounter (HOSPITAL_COMMUNITY): Payer: BLUE CROSS/BLUE SHIELD

## 2015-07-06 ENCOUNTER — Encounter (HOSPITAL_COMMUNITY): Payer: BLUE CROSS/BLUE SHIELD

## 2015-07-09 ENCOUNTER — Encounter (HOSPITAL_COMMUNITY): Payer: BLUE CROSS/BLUE SHIELD

## 2015-07-11 ENCOUNTER — Encounter (HOSPITAL_COMMUNITY): Payer: BLUE CROSS/BLUE SHIELD

## 2015-07-13 ENCOUNTER — Encounter (HOSPITAL_COMMUNITY): Payer: BLUE CROSS/BLUE SHIELD

## 2015-07-16 ENCOUNTER — Encounter (HOSPITAL_COMMUNITY): Payer: BLUE CROSS/BLUE SHIELD

## 2015-07-18 ENCOUNTER — Encounter (HOSPITAL_COMMUNITY): Payer: BLUE CROSS/BLUE SHIELD

## 2015-07-20 ENCOUNTER — Encounter (HOSPITAL_COMMUNITY): Payer: BLUE CROSS/BLUE SHIELD

## 2015-07-23 ENCOUNTER — Encounter (HOSPITAL_COMMUNITY): Payer: BLUE CROSS/BLUE SHIELD

## 2015-07-25 ENCOUNTER — Encounter (HOSPITAL_COMMUNITY): Payer: BLUE CROSS/BLUE SHIELD

## 2015-07-27 ENCOUNTER — Encounter (HOSPITAL_COMMUNITY): Payer: BLUE CROSS/BLUE SHIELD

## 2015-07-30 ENCOUNTER — Encounter (HOSPITAL_COMMUNITY): Payer: BLUE CROSS/BLUE SHIELD

## 2015-08-01 ENCOUNTER — Encounter (HOSPITAL_COMMUNITY): Payer: BLUE CROSS/BLUE SHIELD

## 2015-08-03 ENCOUNTER — Encounter (HOSPITAL_COMMUNITY): Payer: BLUE CROSS/BLUE SHIELD

## 2015-08-06 ENCOUNTER — Encounter (HOSPITAL_COMMUNITY): Payer: BLUE CROSS/BLUE SHIELD

## 2015-08-08 ENCOUNTER — Encounter (HOSPITAL_COMMUNITY): Payer: Self-pay | Admitting: Cardiac Rehabilitation

## 2015-08-08 ENCOUNTER — Encounter (HOSPITAL_COMMUNITY): Payer: BLUE CROSS/BLUE SHIELD

## 2015-08-08 NOTE — Progress Notes (Signed)
Discharge Summary  Patient Details  Name: Leonard Edwards MRN: [id number] Date of Birth: 1955/11/24 Referring Provider:        CARDIAC REHAB PHASE II EXERCISE from 05/14/2015 in [place]   Referring Provider  Sherren Mocha MD       Number of Visits: 9  Reason for Discharge:  Patient independent in their exercise.    Smoking History:  History  Smoking status  . Never Smoker   Smokeless tobacco  . Never Used    Diagnosis:  No diagnosis found.  ADL UCSD:   Initial Exercise Prescription:     Initial Exercise Prescription - 05/17/15 1400    Date of Initial Exercise RX and Referring Provider   Date 05/03/15   Referring Provider Sherren Mocha MD   Treadmill   MPH 2.5   Grade 1   Minutes 10   METs 3.2   Bike   Level 1   Minutes 10   METs 3.2   NuStep   Level 3   Minutes 10   METs 2   Prescription Details   Frequency (times per week) 3   Intensity   THRR 40-80% of Max Heartrate 64-129   Ratings of Perceived Exertion 11-13   Progression   Progression Continue to progress workloads to maintain intensity without signs/symptoms of physical distress.   Resistance Training   Training Prescription Yes   Weight 2   Reps 10-12      Discharge Exercise Prescription (Final Exercise Prescription Changes):     Exercise Prescription Changes - 06/22/15 1600    Exercise Review   Progression Yes   Response to Exercise   Blood Pressure (Admit) 124/78 mmHg   Blood Pressure (Exercise) 154/76 mmHg   Blood Pressure (Exit) 126/70 mmHg   Heart Rate (Admit) 87 bpm   Heart Rate (Exercise) 94 bpm   Heart Rate (Exit) 63 bpm   Rating of Perceived Exertion (Exercise) 12   Progression   Average METs 3.4   Resistance Training   Training Prescription Yes   Weight 3lbs   Reps 10-12   Treadmill   MPH 3   Grade 2   Minutes 10   METs 4.12   Bike   Level 1   Minutes 10   METs 2.72   NuStep   Level 4   Minutes 10   METs 2.7   Home  Exercise Plan   Plans to continue exercise at Home  walk on treadmill at home or "Club, "Sport time Fitness   Frequency Add 3 additional days to program exercise sessions.      Functional Capacity:     6 Minute Walk      05/03/15 1027       6 Minute Walk   Phase Initial     Distance 1625 feet     Walk Time 6 minutes     # of Rest Breaks 0     MPH 3     METS 3.3     RPE 7     VO2 Peak 11.67     Symptoms No     Resting HR 67 bpm     Resting BP 134/60 mmHg     Max Ex. HR 70 bpm     Max Ex. BP 138/70 mmHg     2 Minute Post BP 106/64 mmHg     Interval HR   Baseline HR 67     6 Minute HR 70  2 Minute Post HR 61        Psychological, QOL, Others - Outcomes: PHQ 2/9: Depression screen PHQ 2/9 05/09/2015  Decreased Interest 0  Down, Depressed, Hopeless 0  PHQ - 2 Score 0    Quality of Life:     Quality of Life - 05/03/15 1037    Quality of Life Scores   Health/Function Pre 24 %   Socioeconomic Pre 27.71 %   Psych/Spiritual Pre 27.43 %   Family Pre 30 %   GLOBAL Pre 26.35 %      Personal Goals: Goals established at orientation with interventions provided to work toward goal.     Personal Goals and Risk Factors at Admission - 05/03/15 0846    Core Components/Risk Factors/Patient Goals on Admission    Weight Management Yes;Obesity;Weight Loss   Intervention Weight Management: Develop a combined nutrition and exercise program designed to reach desired caloric intake, while maintaining appropriate intake of nutrient and fiber, sodium and fats, and appropriate energy expenditure required for the weight goal.;Weight Management: Provide education and appropriate resources to help participant work on and attain dietary goals.;Weight Management/Obesity: Establish reasonable short term and long term weight goals.;Obesity: Provide education and appropriate resources to help participant work on and attain dietary goals.   Admit Weight 249 lb 12.5 oz (113.3 kg)   Goal  Weight: Short Term 239 lb (108.41 kg)   Goal Weight: Long Term 209 lb (94.802 kg)   Expected Outcomes Short Term: Continue to assess and modify interventions until short term weight is achieved;Long Term: Adherence to nutrition and physical activity/exercise program aimed toward attainment of established weight goal;Weight Maintenance: Understanding of the daily nutrition guidelines, which includes 25-35% calories from fat, 7% or less cal from saturated fats, less than 200mg  cholesterol, less than 1.5gm of sodium, & 5 or more servings of fruits and vegetables daily;Weight Loss: Understanding of general recommendations for a balanced deficit meal plan, which promotes 1-2 lb weight loss per week and includes a negative energy balance of [phone] kcal/d;Understanding recommendations for meals to include 15-35% energy as protein, 25-35% energy from fat, 35-60% energy from carbohydrates, less than 200mg  of dietary cholesterol, 20-35 gm of total fiber daily;Understanding of distribution of calorie intake throughout the day with the consumption of 4-5 meals/snacks   Hypertension Yes   Intervention Provide education on lifestyle modifcations including regular physical activity/exercise, weight management, moderate sodium restriction and increased consumption of fresh fruit, vegetables, and low fat dairy, alcohol moderation, and smoking cessation.;Monitor prescription use compliance.   Expected Outcomes Short Term: Continued assessment and intervention until BP is < 140/44mm HG in hypertensive participants. < 130/7mm HG in hypertensive participants with diabetes, heart failure or chronic kidney disease.;Long Term: Maintenance of blood pressure at goal levels.   Lipids Yes   Intervention Provide education and support for participant on nutrition & aerobic/resistive exercise along with prescribed medications to achieve LDL 70mg , HDL >40mg .   Expected Outcomes Short Term: Participant states understanding of desired  cholesterol values and is compliant with medications prescribed. Participant is following exercise prescription and nutrition guidelines.;Long Term: Cholesterol controlled with medications as prescribed, with individualized exercise RX and with personalized nutrition plan. Value goals: LDL < 70mg , HDL > 40 mg.   Stress Yes   Intervention Offer individual and/or small group education and counseling on adjustment to heart disease, stress management and health-related lifestyle change. Teach and support self-help strategies.   Expected Outcomes Long Term: Emotional wellbeing is indicated by absence of clinically significant psychosocial  distress or social isolation.;Short Term: Participant demonstrates changes in health-related behavior, relaxation and other stress management skills, ability to obtain effective social support, and compliance with psychotropic medications if prescribed.   Personal Goal Other Yes   Personal Goal Learn nutrition guidelines, find new normal/routine for gym to continue, increase flexibilty   Intervention Provided nutrition education and exercise prescription in program, Challenge pt with exercise program   Expected Outcomes Adherance to diet and exercise regimine and an increase in sit and reach results       Personal Goals Discharge:   Nutrition & Weight - Outcomes:     Pre Biometrics - 05/03/15 1038    Pre Biometrics   Waist Circumference 43 inches   Hip Circumference 43.75 inches   Waist to Hip Ratio 0.98 %   Triceps Skinfold 9 mm   % Body Fat 29.4 %   Grip Strength 45 kg   Single Leg Stand 4.23 seconds       Nutrition:     Nutrition Therapy & Goals - 05/03/15 1434    Nutrition Therapy   Diet Therapeutic Lifestyle Changes   Personal Nutrition Goals   Personal Goal #1 Weight loss of 6-24 lb at graduation from [place], educate and counsel regarding individualized specific dietary modifications aiming  towards targeted core components such as weight, hypertension, lipid management, diabetes, heart failure and other comorbidities.   Expected Outcomes Short Term Goal: Understand basic principles of dietary content, such as calories, fat, sodium, cholesterol and nutrients.;Long Term Goal: Adherence to prescribed nutrition plan.      Nutrition Discharge:     Nutrition Assessments - 05/14/15 1518    MEDFICTS Scores   Pre Score 33      Education Questionnaire Score:     Knowledge Questionnaire Score - 05/03/15 1031    Knowledge Questionnaire Score   Pre Score 88      Goals reviewed with patient; copy given to patient.

## 2015-08-10 ENCOUNTER — Encounter (HOSPITAL_COMMUNITY): Payer: BLUE CROSS/BLUE SHIELD

## 2015-08-28 ENCOUNTER — Encounter (INDEPENDENT_AMBULATORY_CARE_PROVIDER_SITE_OTHER): Payer: Self-pay

## 2015-08-28 ENCOUNTER — Ambulatory Visit (INDEPENDENT_AMBULATORY_CARE_PROVIDER_SITE_OTHER): Payer: BLUE CROSS/BLUE SHIELD | Admitting: Nurse Practitioner

## 2015-08-28 ENCOUNTER — Encounter: Payer: Self-pay | Admitting: Nurse Practitioner

## 2015-08-28 VITALS — BP 170/100 | HR 61 | Resp 95 | Ht 70.0 in | Wt 247.1 lb

## 2015-08-28 DIAGNOSIS — Z951 Presence of aortocoronary bypass graft: Secondary | ICD-10-CM

## 2015-08-28 DIAGNOSIS — I2510 Atherosclerotic heart disease of native coronary artery without angina pectoris: Secondary | ICD-10-CM | POA: Diagnosis not present

## 2015-08-28 DIAGNOSIS — I10 Essential (primary) hypertension: Secondary | ICD-10-CM

## 2015-08-28 DIAGNOSIS — E785 Hyperlipidemia, unspecified: Secondary | ICD-10-CM | POA: Diagnosis not present

## 2015-08-28 MED ORDER — ATORVASTATIN CALCIUM 10 MG PO TABS
10.0000 mg | ORAL_TABLET | Freq: Every day | ORAL | 3 refills | Status: DC
Start: 2015-08-28 — End: 2015-11-01

## 2015-08-28 NOTE — Progress Notes (Signed)
CARDIOLOGY OFFICE NOTE  Date:  08/28/2015    [name] Date of Birth: Jul 08, 1955 Medical Record [id number]  PCP:  Marton Redwood, MD  Cardiologist:  Jerel Shepherd    Chief Complaint  Patient presents with  . Coronary Artery Disease  . Hyperlipidemia  . Hypertension    3 month check - seen for Dr. Burt Knack    History of Present Illness: Leonard Edwards is a 60 y.o. male who presents today for a 3 month check. Seen for Dr. Burt Knack.   He has a history of CAD and hypertension. He initially presented several years ago with acute coronary syndrome and underwent PCI to right coronary artery in 2006. He developed recurrent anginal symptoms and underwent stenting of the ramus intermedius branch in 2011.  I have seen him several times over the past year or so - earlier this year in 2017 he had more chest pain - updated his Myoview which turned out abnormal and he was referred for cardiac cath - this showed an 80% distal LM stenosis. The ostial and proximal LAD had a 90% stenosis and there was 50% distal LAD stenosis. The RCA has an 80% ostial PDA stenosis. The LVEF was felt to be normal. The Ramus was large with a patent stent. The LCX is a relatively small caliber system with 60% proximal and 70% mid stenoses. He was then referred on for CABG with Dr. Cyndia Bent. He had LIMA to LAD, free RIMA to ramus and SVG to PD.   Post op course complicated by bronchitis and he required antibiotics.   He has done well initially after his surgery - had lost weight. Did have a visit to the ER for abdominal pain - LFTS were up - had to stop his statin. LFTs improved. Last seen in April and he was doing well.   Comes in today. Here alone. Continues to do well.  No medicines yet today - forgot. No chest pain. Not short of breath. He feels like his aching/myalgias are better of Crestor. Wishes he could stay off statin. Little off track with taking care of himself. Has new grandbaby and has been  traveling this summer. BP good at home. No more belly pain.  Past Medical History:  Diagnosis Date  . Benign prostatic hypertrophy   . CAD (coronary artery disease)    DES to distal RCA in 09/2004; DES to Ramus Intermediate 10/2009 following abnormal stress test  . Esophageal stricture   . Fibromyalgia   . Gastric polyp   . GERD (gastroesophageal reflux disease)   . Gout   . Hiatal hernia   . Hyperlipidemia   . Hypertension   . Obesity   . Pneumonia    hx of    Past Surgical History:  Procedure Laterality Date  . CARDIAC CATHETERIZATION N/A 03/29/2015   Procedure: Left Heart Cath and Coronary Angiography;  Surgeon: Sherren Mocha, MD;  Location: [facility];  Service: Cardiovascular;  Laterality: N/A;  . COLONOSCOPY W/ POLYPECTOMY    . CORONARY ARTERY BYPASS GRAFT N/A 04/06/2015   Procedure: CORONARY ARTERY BYPASS GRAFTING (CABG) x three,  using bilateral mammaries, and right leg greater saphenous vein harvested endoscopically;  Surgeon: Gaye Pollack, MD;  Location: [facility];  Service: Open Heart Surgery;  Laterality: N/A;  . DES to distal RCA in 09/2004; DES to Ramus Intermediate 10/2009 following abnormal stress test  2006, 2011  . TEE WITHOUT CARDIOVERSION N/A 04/06/2015   Procedure: TRANSESOPHAGEAL ECHOCARDIOGRAM (  TEE);  Surgeon: Gaye Pollack, MD;  Location: [facility];  Service: Open Heart Surgery;  Laterality: N/A;  . ULTRASOUND GUIDANCE FOR VASCULAR ACCESS  03/29/2015   Procedure: Ultrasound Guidance For Vascular Access;  Surgeon: Sherren Mocha, MD;  Location: [facility];  Service: Cardiovascular;;     Medications: Current Outpatient Prescriptions  Medication Sig Dispense Refill  . allopurinol (ZYLOPRIM) 300 MG tablet Take 300 mg by mouth daily as needed (for gout flare ups).     Marland Kitchen amLODipine (NORVASC) 10 MG tablet Take 1 tablet (10 mg total) by mouth daily. 90 tablet 3  . aspirin EC 325 MG EC tablet Take 1 tablet (325 mg total) by mouth daily. 30 tablet 0  .  Cholecalciferol (VITAMIN D3) 1000 UNITS CAPS Take 1,000 Units by mouth daily.      Marland Kitchen CIALIS 20 MG tablet TAKE ONE TABLET DAILY AS NEEDED FOR     ERECTILE DYSFUNCTION 10 tablet 5  . Coenzyme Q10 (CO Q 10) 100 MG CAPS Take 100 mg by mouth daily.      . fluticasone (FLONASE) 50 MCG/ACT nasal spray Place 2 sprays into both nostrils daily as needed for allergies or rhinitis.    Marland Kitchen lisinopril (PRINIVIL,ZESTRIL) 10 MG tablet TAKE 1 TABLET BY MOUTH DAILY 30 tablet 5  . metoprolol succinate (TOPROL-XL) 50 MG 24 hr tablet Take 1 tablet (50 mg total) by mouth 2 (two) times daily. 60 tablet 11  . montelukast (SINGULAIR) 10 MG tablet Take 10 mg by mouth at bedtime.    Marland Kitchen omega-3 acid ethyl esters (LOVAZA) 1 g capsule Take 1 g by mouth daily.    Marland Kitchen omeprazole (PRILOSEC) 40 MG capsule Take 1 capsule (40 mg total) by mouth daily as needed (acid reflux). 30 capsule 6  . atorvastatin (LIPITOR) 10 MG tablet Take 1 tablet (10 mg total) by mouth daily. 90 tablet 3   No current facility-administered medications for this visit.     Allergies: No Known Allergies  Social History: The patient  reports that he has never smoked. He has never used smokeless tobacco. He reports that he drinks about 3.0 oz of alcohol per week . He reports that he does not use drugs.   Family History: The patient's family history includes COPD in his mother; Diabetes type II in his sister; Heart attack in his father; Lupus in his sister.   Review of Systems: Please see the history of present illness.   Otherwise, the review of systems is positive for none.   All other systems are reviewed and negative.   Physical Exam: VS:  BP (!) 170/100 (BP Location: Left Arm, Patient Position: Sitting, Cuff Size: Normal)   Pulse 61   Resp (!) 95 Comment: at rest  Ht 5\' 10"  (1.778 m)   Wt 247 lb 1.9 oz (112.1 kg)   BMI 35.46 kg/m  .  BMI Body mass index is 35.46 kg/m.  Wt Readings from Last 3 Encounters:  08/28/15 247 lb 1.9 oz (112.1 kg)    06/13/15 242 lb (109.8 kg)  05/22/15 242 lb (109.8 kg)   BP is 160/80 by me.  General: Pleasant. Well developed, well nourished and in no acute distress.  He has gained 5 pounds. No medicines yet today.  HEENT: Normal.  Neck: Supple, no JVD, carotid bruits, or masses noted.  Cardiac: Regular rate and rhythm. No murmurs, rubs, or gallops. No edema.  Respiratory:  Lungs are clear to auscultation bilaterally with normal work of breathing.  GI: Soft and nontender.  MS: No deformity or atrophy. Gait and ROM intact.  Skin: Warm and dry. Color is normal.  Neuro:  Strength and sensation are intact and no gross focal deficits noted.  Psych: Alert, appropriate and with normal affect.   LABORATORY DATA:  EKG:  EKG is not ordered today.  Lab Results  Component Value Date   WBC 13.3 (H) 05/16/2015   HGB 13.7 05/16/2015   HCT 41.6 05/16/2015   PLT 351 05/16/2015   GLUCOSE 97 05/22/2015   CHOL 132 05/22/2015   TRIG 120 05/22/2015   HDL 29 (L) 05/22/2015   LDLDIRECT 68.2 10/25/2010   LDLCALC 79 05/22/2015   ALT 25 06/25/2015   AST 22 06/25/2015   NA 140 05/22/2015   K 4.2 05/22/2015   CL 104 05/22/2015   CREATININE 0.72 05/22/2015   BUN 9 05/22/2015   CO2 27 05/22/2015   TSH 1.81 07/17/2014   PSA 1.03 08/29/2009   INR 1.39 04/06/2015   HGBA1C 5.9 (H) 04/04/2015    BNP (last 3 results) No results for input(s): BNP in the last 8760 hours.  ProBNP (last 3 results) No results for input(s): PROBNP in the last 8760 hours.   Other Studies Reviewed Today:   Assessment/Plan: 1. CAD with recent CABG x 3 - he is doing well - continue with current plan of care. Needs to make sure he stays on track with CV risk factor modification.  2. HTN - off HCTZ. BP better at home - he has not had his medicines today yet. He will continue to monitor at home.  3. HLD - needs to resume statin - will start Lipitor 10 mg. Labs in 4 weeks.   4. Obesity - discussed at length again today. Needs  to get back on track.   5. Prior episode of abdominal pain with elevated LFTs - last check LFTs were back to normal. Will follow.   Current medicines are reviewed with the patient today.  The patient does not have concerns regarding medicines other than what has been noted above.  The following changes have been made:  See above.  Labs/ tests ordered today include:    Orders Placed This Encounter  Procedures  . Basic metabolic panel  . Hepatic function panel  . Lipid panel     Disposition:   FU with me in 4 months.   Patient is agreeable to this plan and will call if any problems develop in the interim.   Signed: Burtis Junes, RN, ANP-C 08/28/2015 9:32 AM  [facility] [address] [place], [place]  [address] Phone: [phone] Fax: [phone]

## 2015-08-28 NOTE — Patient Instructions (Addendum)
We will be checking the following labs today - NONE  Fasting labs in 4 weeks - BMET, Lipids and HPF   Medication Instructions:    Continue with your current medicines. BUT  I am adding Lipitor 10 mg a day   Testing/Procedures To Be Arranged:  N/A  Follow-Up:   See me in 4 months    Other Special Instructions:   N/A    If you need a refill on your cardiac medications before your next appointment, please call your pharmacy.   Call the [facility] office at [phone] if you have any questions, problems or concerns.

## 2015-09-26 ENCOUNTER — Other Ambulatory Visit: Payer: BLUE CROSS/BLUE SHIELD

## 2015-10-15 DIAGNOSIS — M109 Gout, unspecified: Secondary | ICD-10-CM | POA: Diagnosis not present

## 2015-10-15 DIAGNOSIS — E784 Other hyperlipidemia: Secondary | ICD-10-CM | POA: Diagnosis not present

## 2015-10-15 DIAGNOSIS — Z125 Encounter for screening for malignant neoplasm of prostate: Secondary | ICD-10-CM | POA: Diagnosis not present

## 2015-10-15 DIAGNOSIS — Z0000 Encounter for general adult medical examination without abnormal findings: Secondary | ICD-10-CM | POA: Diagnosis not present

## 2015-10-15 DIAGNOSIS — I10 Essential (primary) hypertension: Secondary | ICD-10-CM | POA: Diagnosis not present

## 2015-10-18 DIAGNOSIS — Z0000 Encounter for general adult medical examination without abnormal findings: Secondary | ICD-10-CM | POA: Diagnosis not present

## 2015-10-18 DIAGNOSIS — Z23 Encounter for immunization: Secondary | ICD-10-CM | POA: Diagnosis not present

## 2015-10-18 DIAGNOSIS — I10 Essential (primary) hypertension: Secondary | ICD-10-CM | POA: Diagnosis not present

## 2015-10-18 DIAGNOSIS — E291 Testicular hypofunction: Secondary | ICD-10-CM | POA: Diagnosis not present

## 2015-10-18 DIAGNOSIS — R7301 Impaired fasting glucose: Secondary | ICD-10-CM | POA: Diagnosis not present

## 2015-10-18 DIAGNOSIS — Z1389 Encounter for screening for other disorder: Secondary | ICD-10-CM | POA: Diagnosis not present

## 2015-10-18 DIAGNOSIS — E8881 Metabolic syndrome: Secondary | ICD-10-CM | POA: Diagnosis not present

## 2015-10-18 DIAGNOSIS — E782 Mixed hyperlipidemia: Secondary | ICD-10-CM | POA: Diagnosis not present

## 2015-10-19 ENCOUNTER — Telehealth: Payer: Self-pay

## 2015-10-19 NOTE — Telephone Encounter (Signed)
There are still orders on acct for split from 12/14/2014.  Have new referral, should he come in for ov or just be scheduled for split?

## 2015-10-22 NOTE — Telephone Encounter (Signed)
Pls schedule visit first.

## 2015-10-22 NOTE — Telephone Encounter (Signed)
Patient last seen on 12/14/2014. Do you want him to schedule sleep consult or proceed with split study?

## 2015-10-24 ENCOUNTER — Telehealth: Payer: Self-pay

## 2015-10-24 NOTE — Telephone Encounter (Addendum)
Leonard Edwards, this patient is the one I asked if he needed ov or directly schedule him for sleep study since there is an order pending from December 14, 2014.  Dr. Rexene Alberts said ov first, but he insist that ov be bypassed and just schedule him for his sleep study and says to call him when ready to schedule sleep study only.  His wife is waiting to hear back from me, told her that I would run by dr Rexene Alberts again.

## 2015-10-24 NOTE — Telephone Encounter (Signed)
Please call patient back and explain to him that we have to do an updated history and exam. His medical history has changed in the interim, in particular, d/t his open heart surgery. This will his diagnoses for the sleep study request.

## 2015-10-24 NOTE — Telephone Encounter (Signed)
Leonard Edwards,  I called house phone, no answer and no vm. I called patient's cell, no answer and vm full. I called wife and left message on her vm with message below. I have asked that they call back and schedule appt with Dr. Rexene Alberts.

## 2015-10-26 ENCOUNTER — Other Ambulatory Visit: Payer: Self-pay | Admitting: *Deleted

## 2015-10-26 ENCOUNTER — Telehealth: Payer: Self-pay | Admitting: Nurse Practitioner

## 2015-10-26 ENCOUNTER — Encounter: Payer: Self-pay | Admitting: Nurse Practitioner

## 2015-10-26 MED ORDER — NITROGLYCERIN 0.4 MG SL SUBL: 0.4000 mg | SUBLINGUAL_TABLET | SUBLINGUAL | 1 refills | Status: DC | PRN

## 2015-10-26 NOTE — Telephone Encounter (Signed)
New message    Pt wants Leonard Edwards to call him. No other info given.

## 2015-10-26 NOTE — Telephone Encounter (Signed)
S/w pt sent in nitro.  Pt does carry nitro but it is old.  Sent in new script. Went over with pt how to take.

## 2015-10-26 NOTE — Telephone Encounter (Signed)
S/w pt is having left arm pain, neck soreness, and pain in both shoulders.  Last 2-3 nights when walking at the gym having chest tightness.  Pt is fighting a sinus infection stated had a physical a couple of weeks ago with Dr.Shaw talked about maybe zetia or a injection for lipids.  Checked bp 147/81. Is going to take it easy this weekend and will come in on Monday for appointment with Cecille Rubin. S/w Cecille Rubin agrees.

## 2015-10-29 ENCOUNTER — Encounter: Payer: Self-pay | Admitting: Nurse Practitioner

## 2015-10-29 ENCOUNTER — Ambulatory Visit (INDEPENDENT_AMBULATORY_CARE_PROVIDER_SITE_OTHER): Payer: BLUE CROSS/BLUE SHIELD | Admitting: Nurse Practitioner

## 2015-10-29 ENCOUNTER — Encounter (INDEPENDENT_AMBULATORY_CARE_PROVIDER_SITE_OTHER): Payer: Self-pay

## 2015-10-29 ENCOUNTER — Other Ambulatory Visit: Payer: Self-pay | Admitting: Nurse Practitioner

## 2015-10-29 VITALS — BP 150/80 | HR 57 | Ht 70.0 in | Wt 251.8 lb

## 2015-10-29 DIAGNOSIS — E785 Hyperlipidemia, unspecified: Secondary | ICD-10-CM

## 2015-10-29 DIAGNOSIS — R0789 Other chest pain: Secondary | ICD-10-CM

## 2015-10-29 DIAGNOSIS — I10 Essential (primary) hypertension: Secondary | ICD-10-CM

## 2015-10-29 DIAGNOSIS — Z951 Presence of aortocoronary bypass graft: Secondary | ICD-10-CM | POA: Diagnosis not present

## 2015-10-29 LAB — BASIC METABOLIC PANEL
BUN: 12 mg/dL (ref 7–25)
CO2: 27 mmol/L (ref 20–31)
Calcium: 9.9 mg/dL (ref 8.6–10.3)
Chloride: 104 mmol/L (ref 98–110)
Creat: 0.76 mg/dL (ref 0.70–1.33)
Glucose, Bld: 85 mg/dL (ref 65–99)
Potassium: 4.4 mmol/L (ref 3.5–5.3)
Sodium: 141 mmol/L (ref 135–146)

## 2015-10-29 LAB — CBC
HCT: 46.6 % (ref 38.5–50.0)
Hemoglobin: 16.4 g/dL (ref 13.2–17.1)
MCH: 30.9 pg (ref 27.0–33.0)
MCHC: 35.2 g/dL (ref 32.0–36.0)
MCV: 87.9 fL (ref 80.0–100.0)
MPV: 10.2 fL (ref 7.5–12.5)
Platelets: 340 10*3/uL (ref 140–400)
RBC: 5.3 MIL/uL (ref 4.20–5.80)
RDW: 13.7 % (ref 11.0–15.0)
WBC: 9.2 10*3/uL (ref 3.8–10.8)

## 2015-10-29 LAB — APTT: aPTT: 28 s (ref 22–34)

## 2015-10-29 LAB — PROTIME-INR
INR: 1.1
Prothrombin Time: 11.2 s (ref 9.0–11.5)

## 2015-10-29 NOTE — Progress Notes (Signed)
CARDIOLOGY OFFICE NOTE  Date:  10/29/2015    Leonard Edwards Date of Birth: [birth year] Medical Record [id number]  PCP:  Marton Redwood, MD  Cardiologist:  Jerel Shepherd  Chief Complaint  Patient presents with  . Coronary Artery Disease  . Chest Pain    Work in visit - seen for Dr. Burt Knack    History of Present Illness: Leonard Edwards is a 60 y.o. male who presents today for a work in visit. Seen for Dr. Burt Knack.   He has a history of CAD and hypertension. He initially presented several years ago with acute coronary syndrome and underwent PCI to right coronary artery in 2006. He developed recurrent anginal symptoms and underwent stenting of the ramus intermedius branch in 2011.  I have seen him several times over the past year or so - earlier this year in 2017 he had more chest pain - updated his Myoview which turned out abnormal and he was referred for cardiac cath - this showed an 80% distal LM stenosis. The ostial and proximal LAD had a 90% stenosis and there was 50% distal LAD stenosis. The RCA has an 80% ostial PDA stenosis. The LVEF was felt to be normal. The Ramus was large with a patent stent. The LCX is a relatively small caliber system with 60% proximal and 70% mid stenoses. He was then referred on for CABG with Dr. Cyndia Bent. He had LIMA to LAD, free RIMA to ramus and SVG to PD in March of 2017.   Post op course complicated by bronchitis and he required antibiotics.   He has done well initially after his surgery - had lost weight. Did have a visit to the ER for abdominal pain - LFTS were up - had to stop his statin. LFTs improved. Last seen in July and he was doing well.   Called last week - having recurrent chest pain with exertion - thus added back to my schedule for today.   Comes in today. Here alone. He notes that he had his physical about 3 weeks ago - was suffering from sinus/allergies - having more myalgias on his Lipitor - he stopped. For the past week has  noted chest discomfort with left arm discomfort with exertional. Identical to what he was having back in the winter that led to Myoview -> cath -> CABG. It resolves with rest. He tried to walk the last 2 nights - "to see what would happen" - he had recurrent chest pain with left arm discomfort. No NTG use. He is worried. Weight is back up some. BP up some as well.   Past Medical History:  Diagnosis Date  . Benign prostatic hypertrophy   . CAD (coronary artery disease)    DES to distal RCA in 09/2004; DES to Ramus Intermediate 10/2009 following abnormal stress test  . Esophageal stricture   . Fibromyalgia   . Gastric polyp   . GERD (gastroesophageal reflux disease)   . Gout   . Hiatal hernia   . Hyperlipidemia   . Hypertension   . Obesity   . Pneumonia    hx of    Past Surgical History:  Procedure Laterality Date  . CARDIAC CATHETERIZATION N/A 03/29/2015   Procedure: Left Heart Cath and Coronary Angiography;  Surgeon: Sherren Mocha, MD;  Location: [facility];  Service: Cardiovascular;  Laterality: N/A;  . COLONOSCOPY W/ POLYPECTOMY    . CORONARY ARTERY BYPASS GRAFT N/A 04/06/2015   Procedure: CORONARY ARTERY BYPASS  GRAFTING (CABG) x three,  using bilateral mammaries, and right leg greater saphenous vein harvested endoscopically;  Surgeon: Gaye Pollack, MD;  Location: [facility];  Service: Open Heart Surgery;  Laterality: N/A;  . DES to distal RCA in 09/2004; DES to Ramus Intermediate 10/2009 following abnormal stress test  2006, 2011  . TEE WITHOUT CARDIOVERSION N/A 04/06/2015   Procedure: TRANSESOPHAGEAL ECHOCARDIOGRAM (TEE);  Surgeon: Gaye Pollack, MD;  Location: [facility];  Service: Open Heart Surgery;  Laterality: N/A;  . ULTRASOUND GUIDANCE FOR VASCULAR ACCESS  03/29/2015   Procedure: Ultrasound Guidance For Vascular Access;  Surgeon: Sherren Mocha, MD;  Location: [facility];  Service: Cardiovascular;;     Medications: Current Outpatient Prescriptions  Medication Sig  Dispense Refill  . allopurinol (ZYLOPRIM) 300 MG tablet Take 300 mg by mouth daily as needed (for gout flare ups).     Marland Kitchen amLODipine (NORVASC) 10 MG tablet Take 1 tablet (10 mg total) by mouth daily. 90 tablet 3  . aspirin EC 325 MG EC tablet Take 1 tablet (325 mg total) by mouth daily. 30 tablet 0  . atorvastatin (LIPITOR) 10 MG tablet Take 1 tablet (10 mg total) by mouth daily. 90 tablet 3  . azithromycin (ZITHROMAX) 250 MG tablet dose package for sinus infection  1  . Cholecalciferol (VITAMIN D3) 1000 UNITS CAPS Take 1,000 Units by mouth daily.      Marland Kitchen CIALIS 20 MG tablet TAKE ONE TABLET DAILY AS NEEDED FOR     ERECTILE DYSFUNCTION 10 tablet 5  . Coenzyme Q10 (CO Q 10) 100 MG CAPS Take 100 mg by mouth daily.      . fluticasone (FLONASE) 50 MCG/ACT nasal spray Place 2 sprays into both nostrils daily as needed for allergies or rhinitis.    Marland Kitchen lisinopril (PRINIVIL,ZESTRIL) 10 MG tablet TAKE 1 TABLET BY MOUTH DAILY 30 tablet 5  . metoprolol succinate (TOPROL-XL) 50 MG 24 hr tablet Take 1 tablet (50 mg total) by mouth 2 (two) times daily. 60 tablet 11  . montelukast (SINGULAIR) 10 MG tablet Take 10 mg by mouth at bedtime.    . nitroGLYCERIN (NITROSTAT) 0.4 MG SL tablet Place 1 tablet (0.4 mg total) under the tongue every 5 (five) minutes as needed for chest pain. 25 tablet 1  . omega-3 acid ethyl esters (LOVAZA) 1 g capsule Take 1 g by mouth daily.    Marland Kitchen omeprazole (PRILOSEC) 40 MG capsule Take 1 capsule (40 mg total) by mouth daily as needed (acid reflux). 30 capsule 6   No current facility-administered medications for this visit.     Allergies: No Known Allergies  Social History: The patient  reports that he has never smoked. He has never used smokeless tobacco. He reports that he drinks about 3.0 oz of alcohol per week . He reports that he does not use drugs.   Family History: The patient's family history includes COPD in his mother; Diabetes type II in his sister; Heart attack in his father;  Lupus in his sister.   Review of Systems: Please see the history of present illness.   Otherwise, the review of systems is positive for none.   All other systems are reviewed and negative.   Physical Exam: VS:  BP (!) 150/80   Pulse (!) 57   Ht 5\' 10"  (1.778 m)   Wt 251 lb 12.8 oz (114.2 kg)   BMI 36.13 kg/m  .  BMI Body mass index is 36.13 kg/m.  Wt Readings from  Last 3 Encounters:  10/29/15 251 lb 12.8 oz (114.2 kg)  08/28/15 247 lb 1.9 oz (112.1 kg)  06/13/15 242 lb (109.8 kg)    General: Pleasant. Well developed, well nourished and in no acute distress.   HEENT: Normal.  Neck: Supple, no JVD, carotid bruits, or masses noted.  Cardiac: Regular rate and rhythm. No murmurs, rubs, or gallops. No edema.  Respiratory:  Lungs are clear to auscultation bilaterally with normal work of breathing.  GI: Soft and nontender.  MS: No deformity or atrophy. Gait and ROM intact.  Skin: Warm and dry. Color is normal.  Neuro:  Strength and sensation are intact and no gross focal deficits noted.  Psych: Alert, appropriate and with normal affect.   LABORATORY DATA:  EKG:  EKG is ordered today. This demonstrates sinus bradycardia.  Lab Results  Component Value Date   WBC 13.3 (H) 05/16/2015   HGB 13.7 05/16/2015   HCT 41.6 05/16/2015   PLT 351 05/16/2015   GLUCOSE 97 05/22/2015   CHOL 132 05/22/2015   TRIG 120 05/22/2015   HDL 29 (L) 05/22/2015   LDLDIRECT 68.2 10/25/2010   LDLCALC 79 05/22/2015   ALT 25 06/25/2015   AST 22 06/25/2015   NA 140 05/22/2015   K 4.2 05/22/2015   CL 104 05/22/2015   CREATININE 0.72 05/22/2015   BUN 9 05/22/2015   CO2 27 05/22/2015   TSH 1.81 07/17/2014   PSA 1.03 08/29/2009   INR 1.39 04/06/2015   HGBA1C 5.9 (H) 04/04/2015    BNP (last 3 results) No results for input(s): BNP in the last 8760 hours.  ProBNP (last 3 results) No results for input(s): PROBNP in the last 8760 hours.   Other Studies Reviewed Today:   Assessment/Plan: 1. CAD  with CABG x 3 back in March of 2017 - he now reports the return of exertional chest discomfort - worrisome for angina - discussed with Dr. Burt Knack here in the office today - referring on for repeat cath with possible PCI - The patient understands that risks include but are not limited to stroke (1 in 1000), death (1 in 1000), kidney failure [usually temporary] (1 in 500), bleeding (1 in 200), allergic reaction [possibly serious] (1 in 200), and agrees to proceed.   2. HTN - remains off HCTZ.  May need to add back.   3. HLD - not able to tolerate even low dose Lipitor. Referral to lipid clinic for PCSK-9 - cost will NOT be an issue for him fortunately.   4. Obesity - this has been discussed in depth at past visits  5. Prior episode of abdominal pain with past elevated LFTs - recent check by PCP were normal.  Current medicines are reviewed with the patient today.  The patient does not have concerns regarding medicines other than what has been noted above.  The following changes have been made:  See above.  Labs/ tests ordered today include:    Orders Placed This Encounter  Procedures  . Basic metabolic panel  . CBC  . APTT  . Protime-INR  . EKG 12-Lead     Disposition:   FU with    Patient is agreeable to this plan and will call if any problems develop in the interim.   Signed: Burtis Junes, RN, ANP-C 10/29/2015 12:31 PM  [facility] [address] [place], [place]  [address] Phone: [phone] Fax: [phone]

## 2015-10-29 NOTE — Patient Instructions (Addendum)
We will be checking the following labs today - BMET, CBC, PT, PTT   Medication Instructions:    Continue with your current medicines.     Testing/Procedures To Be Arranged:  Cardiac catheterization on Wednesday   Your provider has recommended a cardiac catherization  You are scheduled for a cardiac catheterization on Wednesday, September 27th at 12:30 pm with Dr. Burt Knack or associate.  Go to [facility] 2nd Floor Short Stay on Wednesday, September 27th at 10:30AM.  Enter thru the [employer] entrance A No food or drink after midnight on Tuesday. You may take your medications with a sip of water on the day of your procedure.   Coronary Angiogram A coronary angiogram, also called coronary angiography, is an X-ray procedure used to look at the arteries in the heart. In this procedure, a dye (contrast dye) is injected through a long, hollow tube (catheter). The catheter is about the size of a piece of cooked spaghetti and is inserted through your groin, wrist, or arm. The dye is injected into each artery, and X-rays are then taken to show if there is a blockage in the arteries of your heart.  LET [facility] CARE PROVIDER KNOW ABOUT:  Any allergies you have, including allergies to shellfish or contrast dye.   All medicines you are taking, including vitamins, herbs, eye drops, creams, and over-the-counter medicines.   Previous problems you or members of your family have had with the use of anesthetics.   Any blood disorders you have.   Previous surgeries you have had.  History of kidney problems or failure.   Other medical conditions you have.  RISKS AND COMPLICATIONS  Generally, a coronary angiogram is a safe procedure. However, about 1 person out of 1000 can have problems that may include:  Allergic reaction to the dye.  Bleeding/bruising from the access site or other locations.  Kidney injury, especially in people with impaired kidney function.  Stroke  (rare).  Heart attack (rare).  Irregular rhythms (rare)  Death (rare)  BEFORE THE PROCEDURE   Do not eat or drink anything after midnight the night before the procedure or as directed by your health care provider.   Ask your health care provider about changing or stopping your regular medicines. This is especially important if you are taking diabetes medicines or blood thinners.  PROCEDURE  You may be given a medicine to help you relax (sedative) before the procedure. This medicine is given through an intravenous (IV) access tube that is inserted into one of your veins.   The area where the catheter will be inserted will be washed and shaved. This is usually done in the groin but may be done in the fold of your arm (near your elbow) or in the wrist.   A medicine will be given to numb the area where the catheter will be inserted (local anesthetic).   The health care provider will insert the catheter into an artery. The catheter will be guided by using a special type of X-ray (fluoroscopy) of the blood vessel being examined.   A special dye will then be injected into the catheter, and X-rays will be taken. The dye will help to show where any narrowing or blockages are located in the heart arteries.    AFTER THE PROCEDURE   If the procedure is done through the leg, you will be kept in bed lying flat for several hours. You will be instructed to not bend or cross your legs.  The insertion  site will be checked frequently.   The pulse in your feet or wrist will be checked frequently.   Additional blood tests, X-rays, and an electrocardiogram may be done.      Other Special Instructions:   Do NOT go walking today or tomorrow please.   Referral to Lipid clinic for consideration of PCSK-9 therapy    If you need a refill on your cardiac medications before your next appointment, please call your pharmacy.   Call the [place] office at [phone] if you have any questions, problems or concerns.

## 2015-10-31 ENCOUNTER — Encounter (HOSPITAL_COMMUNITY)
Admission: RE | Disposition: A | Discharge status: Home / Self Care | Payer: Self-pay | Source: Ambulatory Visit | Attending: Cardiovascular Disease

## 2015-10-31 ENCOUNTER — Ambulatory Visit (HOSPITAL_COMMUNITY)
Admission: RE | Admit: 2015-10-31 | Discharge: 2015-11-01 | Disposition: A | Discharge status: Home / Self Care | Payer: BLUE CROSS/BLUE SHIELD | Source: Ambulatory Visit | Attending: Cardiovascular Disease | Admitting: Cardiovascular Disease

## 2015-10-31 ENCOUNTER — Encounter (HOSPITAL_COMMUNITY): Payer: Self-pay | Admitting: Cardiovascular Disease

## 2015-10-31 DIAGNOSIS — Z833 Family history of diabetes mellitus: Secondary | ICD-10-CM | POA: Diagnosis not present

## 2015-10-31 DIAGNOSIS — T82858A Stenosis of vascular prosthetic devices, implants and grafts, initial encounter: Secondary | ICD-10-CM | POA: Diagnosis not present

## 2015-10-31 DIAGNOSIS — Z955 Presence of coronary angioplasty implant and graft: Secondary | ICD-10-CM | POA: Diagnosis not present

## 2015-10-31 DIAGNOSIS — N400 Enlarged prostate without lower urinary tract symptoms: Secondary | ICD-10-CM | POA: Insufficient documentation

## 2015-10-31 DIAGNOSIS — Z951 Presence of aortocoronary bypass graft: Secondary | ICD-10-CM | POA: Diagnosis not present

## 2015-10-31 DIAGNOSIS — I2089 Other forms of angina pectoris: Secondary | ICD-10-CM | POA: Diagnosis present

## 2015-10-31 DIAGNOSIS — Z825 Family history of asthma and other chronic lower respiratory diseases: Secondary | ICD-10-CM | POA: Insufficient documentation

## 2015-10-31 DIAGNOSIS — Z7982 Long term (current) use of aspirin: Secondary | ICD-10-CM | POA: Insufficient documentation

## 2015-10-31 DIAGNOSIS — I200 Unstable angina: Secondary | ICD-10-CM | POA: Diagnosis present

## 2015-10-31 DIAGNOSIS — Z79899 Other long term (current) drug therapy: Secondary | ICD-10-CM | POA: Insufficient documentation

## 2015-10-31 DIAGNOSIS — Z9889 Other specified postprocedural states: Secondary | ICD-10-CM | POA: Insufficient documentation

## 2015-10-31 DIAGNOSIS — I10 Essential (primary) hypertension: Secondary | ICD-10-CM | POA: Diagnosis not present

## 2015-10-31 DIAGNOSIS — M797 Fibromyalgia: Secondary | ICD-10-CM | POA: Insufficient documentation

## 2015-10-31 DIAGNOSIS — M109 Gout, unspecified: Secondary | ICD-10-CM | POA: Diagnosis not present

## 2015-10-31 DIAGNOSIS — E785 Hyperlipidemia, unspecified: Secondary | ICD-10-CM | POA: Diagnosis not present

## 2015-10-31 DIAGNOSIS — Z7951 Long term (current) use of inhaled steroids: Secondary | ICD-10-CM | POA: Diagnosis not present

## 2015-10-31 DIAGNOSIS — K449 Diaphragmatic hernia without obstruction or gangrene: Secondary | ICD-10-CM | POA: Diagnosis not present

## 2015-10-31 DIAGNOSIS — K219 Gastro-esophageal reflux disease without esophagitis: Secondary | ICD-10-CM | POA: Insufficient documentation

## 2015-10-31 DIAGNOSIS — Z8349 Family history of other endocrine, nutritional and metabolic diseases: Secondary | ICD-10-CM | POA: Diagnosis not present

## 2015-10-31 DIAGNOSIS — Z6835 Body mass index (BMI) 35.0-35.9, adult: Secondary | ICD-10-CM | POA: Insufficient documentation

## 2015-10-31 DIAGNOSIS — E669 Obesity, unspecified: Secondary | ICD-10-CM | POA: Insufficient documentation

## 2015-10-31 DIAGNOSIS — X58XXXA Exposure to other specified factors, initial encounter: Secondary | ICD-10-CM | POA: Insufficient documentation

## 2015-10-31 DIAGNOSIS — Z8249 Family history of ischemic heart disease and other diseases of the circulatory system: Secondary | ICD-10-CM | POA: Diagnosis not present

## 2015-10-31 DIAGNOSIS — I25110 Atherosclerotic heart disease of native coronary artery with unstable angina pectoris: Secondary | ICD-10-CM | POA: Insufficient documentation

## 2015-10-31 DIAGNOSIS — I208 Other forms of angina pectoris: Secondary | ICD-10-CM

## 2015-10-31 HISTORY — DX: Other forms of angina pectoris: I20.8

## 2015-10-31 HISTORY — PX: CARDIAC CATHETERIZATION: SHX172

## 2015-10-31 HISTORY — DX: Pure hypercholesterolemia, unspecified: E78.00

## 2015-10-31 HISTORY — DX: Personal history of other diseases of the musculoskeletal system and connective tissue: Z87.39

## 2015-10-31 HISTORY — DX: Unspecified chronic bronchitis: J42

## 2015-10-31 HISTORY — DX: Other forms of angina pectoris: I20.89

## 2015-10-31 LAB — POCT ACTIVATED CLOTTING TIME: ACTIVATED CLOTTING TIME: 351 s

## 2015-10-31 SURGERY — LEFT HEART CATH AND CORS/GRAFTS ANGIOGRAPHY

## 2015-10-31 MED ORDER — ACETAMINOPHEN 500 MG PO TABS: 1000.0000 mg | ORAL_TABLET | Freq: Four times a day (QID) | ORAL | Status: DC | PRN

## 2015-10-31 MED ORDER — LISINOPRIL 10 MG PO TABS
10.0000 mg | ORAL_TABLET | Freq: Every day | ORAL | Status: DC
  Filled 2015-10-31: qty 1

## 2015-10-31 MED ORDER — SODIUM CHLORIDE 0.9 % IV SOLN
INTRAVENOUS | Status: DC | PRN
  Administered 2015-10-31: 1.75 mg/kg/h via INTRAVENOUS

## 2015-10-31 MED ORDER — DIAZEPAM 5 MG PO TABS
10.0000 mg | ORAL_TABLET | ORAL | Status: AC
  Administered 2015-10-31: 10 mg via ORAL

## 2015-10-31 MED ORDER — SODIUM CHLORIDE 0.9 % IV SOLN: INTRAVENOUS | Status: AC

## 2015-10-31 MED ORDER — MONTELUKAST SODIUM 10 MG PO TABS
10.0000 mg | ORAL_TABLET | Freq: Every day | ORAL | Status: DC
  Administered 2015-10-31: 21:00:00 10 mg via ORAL
  Filled 2015-10-31: qty 1

## 2015-10-31 MED ORDER — CLOPIDOGREL BISULFATE 300 MG PO TABS
ORAL_TABLET | ORAL | Status: AC
  Filled 2015-10-31: qty 1

## 2015-10-31 MED ORDER — BIVALIRUDIN BOLUS VIA INFUSION - CUPID
INTRAVENOUS | Status: DC | PRN
  Administered 2015-10-31: 83.325 mg via INTRAVENOUS

## 2015-10-31 MED ORDER — NITROGLYCERIN 1 MG/10 ML FOR IR/CATH LAB
INTRA_ARTERIAL | Status: AC
  Filled 2015-10-31: qty 10

## 2015-10-31 MED ORDER — IOPAMIDOL (ISOVUE-370) INJECTION 76%
INTRAVENOUS | Status: DC | PRN
  Administered 2015-10-31: 195 mL via INTRA_ARTERIAL

## 2015-10-31 MED ORDER — ANGIOPLASTY BOOK
Freq: Once | Status: AC
  Administered 2015-10-31: 17:00:00
  Filled 2015-10-31: qty 1

## 2015-10-31 MED ORDER — MIDAZOLAM HCL 2 MG/2ML IJ SOLN
INTRAMUSCULAR | Status: AC
  Filled 2015-10-31: qty 2

## 2015-10-31 MED ORDER — ASPIRIN 81 MG PO CHEW
81.0000 mg | CHEWABLE_TABLET | Freq: Every day | ORAL | Status: DC
  Filled 2015-10-31: qty 1

## 2015-10-31 MED ORDER — FENTANYL CITRATE (PF) 100 MCG/2ML IJ SOLN
INTRAMUSCULAR | Status: AC
  Filled 2015-10-31: qty 2

## 2015-10-31 MED ORDER — VERAPAMIL HCL 2.5 MG/ML IV SOLN
INTRAVENOUS | Status: DC | PRN
  Administered 2015-10-31: 10 mL via INTRA_ARTERIAL

## 2015-10-31 MED ORDER — BIVALIRUDIN 250 MG IV SOLR
INTRAVENOUS | Status: AC
  Filled 2015-10-31: qty 250

## 2015-10-31 MED ORDER — DIAZEPAM 5 MG PO TABS
ORAL_TABLET | ORAL | Status: AC
  Filled 2015-10-31: qty 2

## 2015-10-31 MED ORDER — LORATADINE 10 MG PO TABS: 10.0000 mg | ORAL_TABLET | Freq: Every day | ORAL | Status: DC | PRN

## 2015-10-31 MED ORDER — ONDANSETRON HCL 4 MG/2ML IJ SOLN: 4.0000 mg | Freq: Four times a day (QID) | INTRAMUSCULAR | Status: DC | PRN

## 2015-10-31 MED ORDER — HEPARIN SODIUM (PORCINE) 1000 UNIT/ML IJ SOLN
INTRAMUSCULAR | Status: DC | PRN
  Administered 2015-10-31: 6000 [IU] via INTRAVENOUS

## 2015-10-31 MED ORDER — SODIUM CHLORIDE 0.9 % IV SOLN: 250.0000 mL | INTRAVENOUS | Status: DC | PRN

## 2015-10-31 MED ORDER — PANTOPRAZOLE SODIUM 40 MG PO TBEC
40.0000 mg | DELAYED_RELEASE_TABLET | Freq: Every day | ORAL | Status: DC
  Administered 2015-10-31 – 2015-11-01 (×2): 40 mg via ORAL
  Filled 2015-10-31 (×2): qty 1

## 2015-10-31 MED ORDER — VERAPAMIL HCL 2.5 MG/ML IV SOLN
INTRAVENOUS | Status: AC
  Filled 2015-10-31: qty 2

## 2015-10-31 MED ORDER — AMLODIPINE BESYLATE 10 MG PO TABS
10.0000 mg | ORAL_TABLET | Freq: Every day | ORAL | Status: DC
  Filled 2015-10-31: qty 1

## 2015-10-31 MED ORDER — MIDAZOLAM HCL 2 MG/2ML IJ SOLN
INTRAMUSCULAR | Status: DC | PRN
  Administered 2015-10-31: 2 mg via INTRAVENOUS
  Administered 2015-10-31: 1 mg via INTRAVENOUS

## 2015-10-31 MED ORDER — FAMOTIDINE IN NACL 20-0.9 MG/50ML-% IV SOLN
INTRAVENOUS | Status: AC
  Filled 2015-10-31: qty 50

## 2015-10-31 MED ORDER — LIDOCAINE HCL (PF) 1 % IJ SOLN
INTRAMUSCULAR | Status: DC | PRN
  Administered 2015-10-31: 2 mL via INTRADERMAL

## 2015-10-31 MED ORDER — OMEGA-3-ACID ETHYL ESTERS 1 G PO CAPS
1.0000 g | ORAL_CAPSULE | Freq: Every day | ORAL | Status: DC
  Administered 2015-11-01: 10:00:00 1 g via ORAL
  Filled 2015-10-31: qty 1

## 2015-10-31 MED ORDER — IOPAMIDOL (ISOVUE-370) INJECTION 76%
INTRAVENOUS | Status: AC
  Filled 2015-10-31: qty 125

## 2015-10-31 MED ORDER — ALUM & MAG HYDROXIDE-SIMETH 200-200-20 MG/5ML PO SUSP
ORAL | Status: AC
  Filled 2015-10-31: qty 30

## 2015-10-31 MED ORDER — HEPARIN (PORCINE) IN NACL 2-0.9 UNIT/ML-% IJ SOLN
INTRAMUSCULAR | Status: AC
  Filled 2015-10-31: qty 500

## 2015-10-31 MED ORDER — ALUM & MAG HYDROXIDE-SIMETH 200-200-20 MG/5ML PO SUSP
ORAL | Status: DC | PRN
  Administered 2015-10-31: 30 mL via ORAL

## 2015-10-31 MED ORDER — SODIUM CHLORIDE 0.9% FLUSH: 3.0000 mL | INTRAVENOUS | Status: DC | PRN

## 2015-10-31 MED ORDER — FLUTICASONE PROPIONATE 50 MCG/ACT NA SUSP
2.0000 | Freq: Every day | NASAL | Status: DC | PRN
  Filled 2015-10-31: qty 16

## 2015-10-31 MED ORDER — SODIUM CHLORIDE 0.9% FLUSH
3.0000 mL | Freq: Two times a day (BID) | INTRAVENOUS | Status: DC
  Administered 2015-10-31: 21:00:00 3 mL via INTRAVENOUS

## 2015-10-31 MED ORDER — CLOPIDOGREL BISULFATE 300 MG PO TABS
ORAL_TABLET | ORAL | Status: DC | PRN
  Administered 2015-10-31: 600 mg via ORAL

## 2015-10-31 MED ORDER — METOPROLOL SUCCINATE ER 50 MG PO TB24
50.0000 mg | ORAL_TABLET | Freq: Two times a day (BID) | ORAL | Status: DC
  Filled 2015-10-31: qty 1

## 2015-10-31 MED ORDER — NITROGLYCERIN 1 MG/10 ML FOR IR/CATH LAB
INTRA_ARTERIAL | Status: DC | PRN
  Administered 2015-10-31: 150 ug via INTRACORONARY
  Administered 2015-10-31 (×2): 200 ug via INTRACORONARY

## 2015-10-31 MED ORDER — ZOLPIDEM TARTRATE 5 MG PO TABS
5.0000 mg | ORAL_TABLET | Freq: Every evening | ORAL | Status: DC | PRN
  Administered 2015-10-31: 21:00:00 5 mg via ORAL
  Filled 2015-10-31: qty 1

## 2015-10-31 MED ORDER — IOPAMIDOL (ISOVUE-370) INJECTION 76%
INTRAVENOUS | Status: AC
  Filled 2015-10-31: qty 100

## 2015-10-31 MED ORDER — FAMOTIDINE IN NACL 20-0.9 MG/50ML-% IV SOLN
INTRAVENOUS | Status: DC | PRN
  Administered 2015-10-31: 20 mg via INTRAVENOUS

## 2015-10-31 MED ORDER — SODIUM CHLORIDE 0.9 % IV SOLN
INTRAVENOUS | Status: DC
  Administered 2015-10-31: 12:00:00 via INTRAVENOUS

## 2015-10-31 MED ORDER — NITROGLYCERIN 0.4 MG SL SUBL: 0.4000 mg | SUBLINGUAL_TABLET | SUBLINGUAL | Status: DC | PRN

## 2015-10-31 MED ORDER — HEPARIN SODIUM (PORCINE) 1000 UNIT/ML IJ SOLN
INTRAMUSCULAR | Status: AC
  Filled 2015-10-31: qty 1

## 2015-10-31 MED ORDER — FENTANYL CITRATE (PF) 100 MCG/2ML IJ SOLN
INTRAMUSCULAR | Status: DC | PRN
  Administered 2015-10-31 (×2): 25 ug via INTRAVENOUS

## 2015-10-31 MED ORDER — LEVOCETIRIZINE DIHYDROCHLORIDE 5 MG PO TABS: 5.0000 mg | ORAL_TABLET | Freq: Every day | ORAL | Status: DC | PRN

## 2015-10-31 MED ORDER — CLOPIDOGREL BISULFATE 75 MG PO TABS
75.0000 mg | ORAL_TABLET | Freq: Every day | ORAL | Status: DC
  Administered 2015-11-01: 08:00:00 75 mg via ORAL
  Filled 2015-10-31: qty 1

## 2015-10-31 MED ORDER — ASPIRIN 81 MG PO CHEW: 81.0000 mg | CHEWABLE_TABLET | ORAL | Status: DC

## 2015-10-31 MED ORDER — HEPARIN (PORCINE) IN NACL 2-0.9 UNIT/ML-% IJ SOLN
INTRAMUSCULAR | Status: DC | PRN
  Administered 2015-10-31: 1000 mL via INTRA_ARTERIAL

## 2015-10-31 MED ORDER — DIPHENHYDRAMINE-APAP (SLEEP) 25-500 MG PO TABS: 0.5000 | ORAL_TABLET | Freq: Every evening | ORAL | Status: DC | PRN

## 2015-10-31 MED ORDER — ALLOPURINOL 300 MG PO TABS: 300.0000 mg | ORAL_TABLET | Freq: Every day | ORAL | Status: DC | PRN

## 2015-10-31 SURGICAL SUPPLY — 21 items
BALLN EMERGE MR 2.0X15 (BALLOONS) ×2
BALLN ~~LOC~~ TREK RX 2.5X20 (BALLOONS) ×2
BALLOON EMERGE MR 2.0X15 (BALLOONS) IMPLANT
BALLOON ~~LOC~~ TREK RX 2.5X20 (BALLOONS) IMPLANT
CATH 5FR JL4 DIAGNOSTIC (CATHETERS) ×1 IMPLANT
CATH IMPULSE 5F ANG/FL3.5 (CATHETERS) ×1 IMPLANT
CATH INFINITI 5 FR AL2 (CATHETERS) ×1 IMPLANT
CATH VISTA GUIDE 6FR XB4 (CATHETERS) ×1 IMPLANT
DEVICE RAD COMP TR BAND LRG (VASCULAR PRODUCTS) ×1 IMPLANT
GLIDESHEATH SLEND SS 6F .021 (SHEATH) ×1 IMPLANT
KIT ENCORE 26 ADVANTAGE (KITS) ×1 IMPLANT
KIT HEART LEFT (KITS) ×2 IMPLANT
PACK CARDIAC CATHETERIZATION (CUSTOM PROCEDURE TRAY) ×2 IMPLANT
STENT SYNERGY DES 2.25X16 (Permanent Stent) ×1 IMPLANT
STENT SYNERGY DES 2.25X24 (Permanent Stent) ×1 IMPLANT
STENT SYNERGY DES 2.5X24 (Permanent Stent) ×1 IMPLANT
SYR MEDRAD MARK V 150ML (SYRINGE) ×1 IMPLANT
TRANSDUCER W/STOPCOCK (MISCELLANEOUS) ×2 IMPLANT
TUBING CIL FLEX 10 FLL-RA (TUBING) ×2 IMPLANT
WIRE COUGAR XT STRL 190CM (WIRE) ×1 IMPLANT
WIRE SAFE-T 1.5MM-J .035X260CM (WIRE) ×1 IMPLANT

## 2015-10-31 NOTE — Interval H&P Note (Signed)
Cath Lab Visit (complete for each Cath Lab visit)  Clinical Evaluation Leading to the Procedure:   ACS: No.  Non-ACS:    Anginal Classification: CCS II  Anti-ischemic medical therapy: Maximal Therapy (2 or more classes of medications)  Non-Invasive Test Results: No non-invasive testing performed  Prior CABG: Previous CABG      History and Physical Interval Note:  10/31/2015 12:46 PM  Leonard Edwards  has presented today for surgery, with the diagnosis of unstable angina - cad  The various methods of treatment have been discussed with the patient and family. After consideration of risks, benefits and other options for treatment, the patient has consented to  Procedure(s): Left Heart Cath and Cors/Grafts Angiography (N/A) as a surgical intervention .  The patient's history has been reviewed, patient examined, no change in status, stable for surgery.  I have reviewed the patient's chart and labs.  Questions were answered to the patient's satisfaction.     Sherren Mocha

## 2015-10-31 NOTE — H&P (View-Only) (Signed)
CARDIOLOGY OFFICE NOTE  Date:  10/29/2015    Leonard Edwards Date of Birth: 1955-09-16 Medical Record [id number]  PCP:  Marton Redwood, MD  Cardiologist:  Jerel Shepherd  Chief Complaint  Patient presents with  . Coronary Artery Disease  . Chest Pain    Work in visit - seen for Dr. Burt Knack    History of Present Illness: Leonard Edwards is a 60 y.o. male who presents today for a work in visit. Seen for Dr. Burt Knack.   He has a history of CAD and hypertension. He initially presented several years ago with acute coronary syndrome and underwent PCI to right coronary artery in 2006. He developed recurrent anginal symptoms and underwent stenting of the ramus intermedius branch in 2011.  I have seen him several times over the past year or so - earlier this year in 2017 he had more chest pain - updated his Myoview which turned out abnormal and he was referred for cardiac cath - this showed an 80% distal LM stenosis. The ostial and proximal LAD had a 90% stenosis and there was 50% distal LAD stenosis. The RCA has an 80% ostial PDA stenosis. The LVEF was felt to be normal. The Ramus was large with a patent stent. The LCX is a relatively small caliber system with 60% proximal and 70% mid stenoses. He was then referred on for CABG with Dr. Cyndia Bent. He had LIMA to LAD, free RIMA to ramus and SVG to PD in March of 2017.   Post op course complicated by bronchitis and he required antibiotics.   He has done well initially after his surgery - had lost weight. Did have a visit to the ER for abdominal pain - LFTS were up - had to stop his statin. LFTs improved. Last seen in July and he was doing well.   Called last week - having recurrent chest pain with exertion - thus added back to my schedule for today.   Comes in today. Here alone. He notes that he had his physical about 3 weeks ago - was suffering from sinus/allergies - having more myalgias on his Lipitor - he stopped. For the past week has  noted chest discomfort with left arm discomfort with exertional. Identical to what he was having back in the winter that led to Myoview -> cath -> CABG. It resolves with rest. He tried to walk the last 2 nights - "to see what would happen" - he had recurrent chest pain with left arm discomfort. No NTG use. He is worried. Weight is back up some. BP up some as well.   Past Medical History:  Diagnosis Date  . Benign prostatic hypertrophy   . CAD (coronary artery disease)    DES to distal RCA in 09/2004; DES to Ramus Intermediate 10/2009 following abnormal stress test  . Esophageal stricture   . Fibromyalgia   . Gastric polyp   . GERD (gastroesophageal reflux disease)   . Gout   . Hiatal hernia   . Hyperlipidemia   . Hypertension   . Obesity   . Pneumonia    hx of    Past Surgical History:  Procedure Laterality Date  . CARDIAC CATHETERIZATION N/A 03/29/2015   Procedure: Left Heart Cath and Coronary Angiography;  Surgeon: Sherren Mocha, MD;  Location: [facility];  Service: Cardiovascular;  Laterality: N/A;  . COLONOSCOPY W/ POLYPECTOMY    . CORONARY ARTERY BYPASS GRAFT N/A 04/06/2015   Procedure: CORONARY ARTERY BYPASS  GRAFTING (CABG) x three,  using bilateral mammaries, and right leg greater saphenous vein harvested endoscopically;  Surgeon: Gaye Pollack, MD;  Location: [facility];  Service: Open Heart Surgery;  Laterality: N/A;  . DES to distal RCA in 09/2004; DES to Ramus Intermediate 10/2009 following abnormal stress test  2006, 2011  . TEE WITHOUT CARDIOVERSION N/A 04/06/2015   Procedure: TRANSESOPHAGEAL ECHOCARDIOGRAM (TEE);  Surgeon: Gaye Pollack, MD;  Location: [facility];  Service: Open Heart Surgery;  Laterality: N/A;  . ULTRASOUND GUIDANCE FOR VASCULAR ACCESS  03/29/2015   Procedure: Ultrasound Guidance For Vascular Access;  Surgeon: Sherren Mocha, MD;  Location: [facility];  Service: Cardiovascular;;     Medications: Current Outpatient Prescriptions  Medication Sig  Dispense Refill  . allopurinol (ZYLOPRIM) 300 MG tablet Take 300 mg by mouth daily as needed (for gout flare ups).     Marland Kitchen amLODipine (NORVASC) 10 MG tablet Take 1 tablet (10 mg total) by mouth daily. 90 tablet 3  . aspirin EC 325 MG EC tablet Take 1 tablet (325 mg total) by mouth daily. 30 tablet 0  . atorvastatin (LIPITOR) 10 MG tablet Take 1 tablet (10 mg total) by mouth daily. 90 tablet 3  . azithromycin (ZITHROMAX) 250 MG tablet dose package for sinus infection  1  . Cholecalciferol (VITAMIN D3) 1000 UNITS CAPS Take 1,000 Units by mouth daily.      Marland Kitchen CIALIS 20 MG tablet TAKE ONE TABLET DAILY AS NEEDED FOR     ERECTILE DYSFUNCTION 10 tablet 5  . Coenzyme Q10 (CO Q 10) 100 MG CAPS Take 100 mg by mouth daily.      . fluticasone (FLONASE) 50 MCG/ACT nasal spray Place 2 sprays into both nostrils daily as needed for allergies or rhinitis.    Marland Kitchen lisinopril (PRINIVIL,ZESTRIL) 10 MG tablet TAKE 1 TABLET BY MOUTH DAILY 30 tablet 5  . metoprolol succinate (TOPROL-XL) 50 MG 24 hr tablet Take 1 tablet (50 mg total) by mouth 2 (two) times daily. 60 tablet 11  . montelukast (SINGULAIR) 10 MG tablet Take 10 mg by mouth at bedtime.    . nitroGLYCERIN (NITROSTAT) 0.4 MG SL tablet Place 1 tablet (0.4 mg total) under the tongue every 5 (five) minutes as needed for chest pain. 25 tablet 1  . omega-3 acid ethyl esters (LOVAZA) 1 g capsule Take 1 g by mouth daily.    Marland Kitchen omeprazole (PRILOSEC) 40 MG capsule Take 1 capsule (40 mg total) by mouth daily as needed (acid reflux). 30 capsule 6   No current facility-administered medications for this visit.     Allergies: No Known Allergies  Social History: The patient  reports that he has never smoked. He has never used smokeless tobacco. He reports that he drinks about 3.0 oz of alcohol per week . He reports that he does not use drugs.   Family History: The patient's family history includes COPD in his mother; Diabetes type II in his sister; Heart attack in his father;  Lupus in his sister.   Review of Systems: Please see the history of present illness.   Otherwise, the review of systems is positive for none.   All other systems are reviewed and negative.   Physical Exam: VS:  BP (!) 150/80   Pulse (!) 57   Ht 5\' 10"  (1.778 m)   Wt 251 lb 12.8 oz (114.2 kg)   BMI 36.13 kg/m  .  BMI Body mass index is 36.13 kg/m.  Wt Readings from  Last 3 Encounters:  10/29/15 251 lb 12.8 oz (114.2 kg)  08/28/15 247 lb 1.9 oz (112.1 kg)  06/13/15 242 lb (109.8 kg)    General: Pleasant. Well developed, well nourished and in no acute distress.   HEENT: Normal.  Neck: Supple, no JVD, carotid bruits, or masses noted.  Cardiac: Regular rate and rhythm. No murmurs, rubs, or gallops. No edema.  Respiratory:  Lungs are clear to auscultation bilaterally with normal work of breathing.  GI: Soft and nontender.  MS: No deformity or atrophy. Gait and ROM intact.  Skin: Warm and dry. Color is normal.  Neuro:  Strength and sensation are intact and no gross focal deficits noted.  Psych: Alert, appropriate and with normal affect.   LABORATORY DATA:  EKG:  EKG is ordered today. This demonstrates sinus bradycardia.  Lab Results  Component Value Date   WBC 13.3 (H) 05/16/2015   HGB 13.7 05/16/2015   HCT 41.6 05/16/2015   PLT 351 05/16/2015   GLUCOSE 97 05/22/2015   CHOL 132 05/22/2015   TRIG 120 05/22/2015   HDL 29 (L) 05/22/2015   LDLDIRECT 68.2 10/25/2010   LDLCALC 79 05/22/2015   ALT 25 06/25/2015   AST 22 06/25/2015   NA 140 05/22/2015   K 4.2 05/22/2015   CL 104 05/22/2015   CREATININE 0.72 05/22/2015   BUN 9 05/22/2015   CO2 27 05/22/2015   TSH 1.81 07/17/2014   PSA 1.03 08/29/2009   INR 1.39 04/06/2015   HGBA1C 5.9 (H) 04/04/2015    BNP (last 3 results) No results for input(s): BNP in the last 8760 hours.  ProBNP (last 3 results) No results for input(s): PROBNP in the last 8760 hours.   Other Studies Reviewed Today:   Assessment/Plan: 1. CAD  with CABG x 3 back in March of 2017 - he now reports the return of exertional chest discomfort - worrisome for angina - discussed with Dr. Burt Knack here in the office today - referring on for repeat cath with possible PCI - The patient understands that risks include but are not limited to stroke (1 in 1000), death (1 in 1000), kidney failure [usually temporary] (1 in 500), bleeding (1 in 200), allergic reaction [possibly serious] (1 in 200), and agrees to proceed.   2. HTN - remains off HCTZ.  May need to add back.   3. HLD - not able to tolerate even low dose Lipitor. Referral to lipid clinic for PCSK-9 - cost will NOT be an issue for him fortunately.   4. Obesity - this has been discussed in depth at past visits  5. Prior episode of abdominal pain with past elevated LFTs - recent check by PCP were normal.  Current medicines are reviewed with the patient today.  The patient does not have concerns regarding medicines other than what has been noted above.  The following changes have been made:  See above.  Labs/ tests ordered today include:    Orders Placed This Encounter  Procedures  . Basic metabolic panel  . CBC  . APTT  . Protime-INR  . EKG 12-Lead     Disposition:   FU with    Patient is agreeable to this plan and will call if any problems develop in the interim.   Signed: Burtis Junes, RN, ANP-C 10/29/2015 12:31 PM  [facility] [address] [place], [place]  [address] Phone: [phone] Fax: [phone]

## 2015-10-31 NOTE — Care Management Note (Signed)
Case Management Note  Patient Details  Name: Leonard Edwards MRN: [id number] Date of Birth: 1955/02/05  Subjective/Objective:   S/p coronary intervention, will be on plavix and asa per MD note, NCM will cont to follow for dc needs.                  Action/Plan:   Expected Discharge Date:                  Expected Discharge Plan:  Home/Self Care  In-House Referral:     Discharge planning Services  CM Consult  Post Acute Care Choice:    Choice offered to:     DME Arranged:    DME Agency:     HH Arranged:    HH Agency:     Status of Service:  In process, will continue to follow  If discussed at Long Length of Stay Meetings, dates discussed:    Additional Comments:  Zenon Mayo, RN 10/31/2015, 4:18 PM

## 2015-10-31 NOTE — Progress Notes (Signed)
TR BAND REMOVAL  LOCATION:    left radial  DEFLATED PER PROTOCOL:    Yes.    TIME BAND OFF / DRESSING APPLIED:    1855   SITE UPON ARRIVAL:    Level 0  SITE AFTER BAND REMOVAL:    Level 0  CIRCULATION SENSATION AND MOVEMENT:    Within Normal Limits   Yes.    COMMENTS:   Patient tolerated well. No bleeding or hematoma noted. Dressing applied.

## 2015-11-01 ENCOUNTER — Encounter (HOSPITAL_COMMUNITY): Payer: Self-pay | Admitting: Cardiology

## 2015-11-01 DIAGNOSIS — Z7951 Long term (current) use of inhaled steroids: Secondary | ICD-10-CM | POA: Diagnosis not present

## 2015-11-01 DIAGNOSIS — K219 Gastro-esophageal reflux disease without esophagitis: Secondary | ICD-10-CM | POA: Diagnosis not present

## 2015-11-01 DIAGNOSIS — E785 Hyperlipidemia, unspecified: Secondary | ICD-10-CM | POA: Diagnosis not present

## 2015-11-01 DIAGNOSIS — Z7982 Long term (current) use of aspirin: Secondary | ICD-10-CM | POA: Diagnosis not present

## 2015-11-01 DIAGNOSIS — M109 Gout, unspecified: Secondary | ICD-10-CM | POA: Diagnosis not present

## 2015-11-01 DIAGNOSIS — T82858A Stenosis of vascular prosthetic devices, implants and grafts, initial encounter: Secondary | ICD-10-CM | POA: Diagnosis not present

## 2015-11-01 DIAGNOSIS — I200 Unstable angina: Secondary | ICD-10-CM

## 2015-11-01 DIAGNOSIS — Z9889 Other specified postprocedural states: Secondary | ICD-10-CM | POA: Diagnosis not present

## 2015-11-01 DIAGNOSIS — Z6835 Body mass index (BMI) 35.0-35.9, adult: Secondary | ICD-10-CM | POA: Diagnosis not present

## 2015-11-01 DIAGNOSIS — K449 Diaphragmatic hernia without obstruction or gangrene: Secondary | ICD-10-CM | POA: Diagnosis not present

## 2015-11-01 DIAGNOSIS — E669 Obesity, unspecified: Secondary | ICD-10-CM | POA: Diagnosis not present

## 2015-11-01 DIAGNOSIS — M797 Fibromyalgia: Secondary | ICD-10-CM | POA: Diagnosis not present

## 2015-11-01 DIAGNOSIS — I10 Essential (primary) hypertension: Secondary | ICD-10-CM | POA: Diagnosis not present

## 2015-11-01 DIAGNOSIS — I25110 Atherosclerotic heart disease of native coronary artery with unstable angina pectoris: Secondary | ICD-10-CM | POA: Diagnosis not present

## 2015-11-01 DIAGNOSIS — Z955 Presence of coronary angioplasty implant and graft: Secondary | ICD-10-CM | POA: Diagnosis not present

## 2015-11-01 DIAGNOSIS — N400 Enlarged prostate without lower urinary tract symptoms: Secondary | ICD-10-CM | POA: Diagnosis not present

## 2015-11-01 DIAGNOSIS — Z951 Presence of aortocoronary bypass graft: Secondary | ICD-10-CM | POA: Diagnosis not present

## 2015-11-01 LAB — BASIC METABOLIC PANEL
ANION GAP: 8 (ref 5–15)
BUN: 9 mg/dL (ref 6–20)
CHLORIDE: 107 mmol/L (ref 101–111)
CO2: 25 mmol/L (ref 22–32)
Calcium: 9.1 mg/dL (ref 8.9–10.3)
Creatinine, Ser: 0.84 mg/dL (ref 0.61–1.24)
GLUCOSE: 87 mg/dL (ref 65–99)
POTASSIUM: 4 mmol/L (ref 3.5–5.1)
Sodium: 140 mmol/L (ref 135–145)

## 2015-11-01 LAB — CBC
HEMATOCRIT: 45.2 % (ref 39.0–52.0)
HEMOGLOBIN: 15.2 g/dL (ref 13.0–17.0)
MCH: 30.4 pg (ref 26.0–34.0)
MCHC: 33.6 g/dL (ref 30.0–36.0)
MCV: 90.4 fL (ref 78.0–100.0)
Platelets: 278 10*3/uL (ref 150–400)
RBC: 5 MIL/uL (ref 4.22–5.81)
RDW: 12.8 % (ref 11.5–15.5)
WBC: 9.6 10*3/uL (ref 4.0–10.5)

## 2015-11-01 MED ORDER — PANTOPRAZOLE SODIUM 40 MG PO TBEC: 40.0000 mg | DELAYED_RELEASE_TABLET | Freq: Every day | ORAL | 11 refills | Status: DC

## 2015-11-01 MED ORDER — ASPIRIN 81 MG PO CHEW: 81.0000 mg | CHEWABLE_TABLET | Freq: Every day | ORAL | Status: DC

## 2015-11-01 MED ORDER — CLOPIDOGREL BISULFATE 75 MG PO TABS: 75.0000 mg | ORAL_TABLET | Freq: Every day | ORAL | 11 refills | Status: DC

## 2015-11-01 MED ORDER — ACETAMINOPHEN 500 MG PO TABS: 500.0000 mg | ORAL_TABLET | Freq: Four times a day (QID) | ORAL | 0 refills | Status: DC | PRN

## 2015-11-01 MED FILL — Nitroglycerin IV Soln 100 MCG/ML in D5W: INTRA_ARTERIAL | Qty: 10 | Status: AC

## 2015-11-01 NOTE — Discharge Summary (Signed)
Discharge Summary    Patient ID: Leonard Edwards,  MRN: [id number], DOB/AGE: [birth year] 60 y.o.  Admit date: 10/31/2015 Discharge date: 11/01/2015  Primary Care Provider: Marton Redwood Primary Cardiologist: Dr. Burt Knack  Discharge Diagnoses    Active Problems:   Exertional angina (Leon)   Crescendo angina [facility])   Allergies No Known Allergies  Diagnostic Studies/Procedures    LHC: 10/31/15  Conclusion     Prox LAD lesion, 90 %stenosed.   1. Severe native three-vessel coronary artery disease with severe distal left main stenosis, severe ostial LAD stenosis, severe diffuse left circumflex stenosis, and severe right PDA stenosis  2. Status post CABG with continued patency of the LIMA to LAD, free RIMA to ramus intermedius, and SVG to right PDA  3. 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   Coronary Diagrams     Post-Intervention Diagram       _____________   History of Present Illness     Leonard Edwards is a 60 y.o. male who presented on 9/25 for a work in visit. Seen for Dr. Burt Knack.   He has a history of CAD and hypertension. He initially presented several years ago with acute coronary syndrome and underwent PCI to right coronary artery in 2006. He developed recurrent anginal symptoms and underwent stenting of the ramus intermedius branch in 2011.  He has been seen several times over the past year or so - earlier this year in 2017 he had more chest pain - updated his Myoview which turned out abnormal and he was referred for cardiac cath - this showed an 80% distal LM stenosis. The ostial and proximal LAD had a 90% stenosis and there was 50% distal LAD stenosis. The RCA has an 80% ostial PDA stenosis. The LVEF was felt to be normal. The Ramus was large with  a patent stent. The LCX is a relatively small caliber system with 60% proximal and 70% mid stenoses. He was then referred on for CABG with Dr. Cyndia Bent. He had LIMA to LAD, free RIMA to ramus and SVG to PD in March of 2017.   Post op course complicated by bronchitis and he required antibiotics.   He has done well initially after his surgery - had lost weight. Did have a visit to the ER for abdominal pain - LFTS were up - had to stop his statin. LFTs improved. Last seen in July and he was doing well.   Called the week prior to his office visit- having recurrent chest pain with exertion - thus added back to the schedule for today.   At his office visit he noted that he had his physical about 3 weeks ago - was suffering from sinus/allergies - having more myalgias on his Lipitor - he stopped. For the past week had noted chest discomfort with left arm discomfort with exertional. Identical to what he was having back in the winter that led to Myoview -> cath -> CABG. It resolves with rest. He tried to walk the last 2 nights - "to see what would happen" - he had recurrent chest pain with left arm discomfort. No NTG use.   Hospital Course     Consultants: None  Underwent LHC yesterday with Dr. Burt Knack showing patent LIMA to LAD, RIMA to Ramus Intermediusm and SVG to right PDA, but new critical stenosis of the distal  LCRx bifurcation which was treated with DES x3. Recommendations for long term treatment with DAPT (ASA and Plavix).  He was observed overnight without any noted complications. Ambulated with cardiac rehab without reports of anginal symptoms. His labs the following morning were stable.   He was seen and assessed by Dr. Percival Spanish and determined stable for discharge home. He has follow up arranged in the office with Cecille Rubin next week. Of note he is unable to take statins, even after being tried on low dose. Arrangements were made in the office for him to be seen in the pharmacy clinic regarding the  use of PCSK9 inhibitors next week.  _____________  Discharge Vitals Blood pressure (!) 149/68, pulse (!) 57, temperature 97.5 F (36.4 C), temperature source Oral, resp. rate 17, height 5\' 10"  (1.778 m), weight 244 lb 11.4 oz (111 kg), SpO2 93 %.  Filed Weights   10/31/15 1051 11/01/15 0502  Weight: 245 lb (111.1 kg) 244 lb 11.4 oz (111 kg)    Labs & Radiologic Studies    CBC  Recent Labs  10/29/15 1241 11/01/15 0433  WBC 9.2 9.6  HGB 16.4 15.2  HCT 46.6 45.2  MCV 87.9 90.4  PLT 340 0000000   Basic Metabolic Panel  Recent Labs  10/29/15 1241 11/01/15 0433  NA 141 140  K 4.4 4.0  CL 104 107  CO2 27 25  GLUCOSE 85 87  BUN 12 9  CREATININE 0.76 0.84  CALCIUM 9.9 9.1   Liver Function Tests No results for input(s): AST, ALT, ALKPHOS, BILITOT, PROT, ALBUMIN in the last 72 hours. No results for input(s): LIPASE, AMYLASE in the last 72 hours. Cardiac Enzymes No results for input(s): CKTOTAL, CKMB, CKMBINDEX, TROPONINI in the last 72 hours. BNP Invalid input(s): POCBNP D-Dimer No results for input(s): DDIMER in the last 72 hours. Hemoglobin A1C No results for input(s): HGBA1C in the last 72 hours. Fasting Lipid Panel No results for input(s): CHOL, HDL, LDLCALC, TRIG, CHOLHDL, LDLDIRECT in the last 72 hours. Thyroid Function Tests No results for input(s): TSH, T4TOTAL, T3FREE, THYROIDAB in the last 72 hours.  Invalid input(s): FREET3 _____________  No results found. Disposition   Pt is being discharged home today in good condition.  Follow-up Plans & Appointments    Follow-up Information    [name], NP Follow up on 11/06/2015.   Specialties:  Nurse Practitioner, Interventional Cardiology, Cardiology, Radiology Why:  at 10am for your hospital follow up  Contact information: [place]. [address] [place] [address] [phone]          Discharge Instructions    Amb Referral to Cardiac Rehabilitation    Complete by:  As directed     Diagnosis:   PTCA Coronary Stents     Diet - low sodium heart healthy    Complete by:  As directed    Discharge instructions    Complete by:  As directed    Radial Site Care Refer to this sheet in the next few weeks. These instructions provide you with information on caring for yourself after your procedure. Your caregiver may also give you more specific instructions. Your treatment has been planned according to current medical practices, but problems sometimes occur. Call your caregiver if you have any problems or questions after your procedure. HOME CARE INSTRUCTIONS You may shower the day after the procedure.Remove the bandage (dressing) and gently wash the site with plain soap and water.Gently pat the site dry.  Do not apply powder or lotion  to the site.  Do not submerge the affected site in water for 3 to 5 days.  Inspect the site at least twice daily.  Do not flex or bend the affected arm for 24 hours.  No lifting over 5 pounds (2.3 kg) for 5 days after your procedure.  Do not drive home if you are discharged the same day of the procedure. Have someone else drive you.  You may drive 24 hours after the procedure unless otherwise instructed by your caregiver.  What to expect: Any bruising will usually fade within 1 to 2 weeks.  Blood that collects in the tissue (hematoma) may be painful to the touch. It should usually decrease in size and tenderness within 1 to 2 weeks.  SEEK IMMEDIATE MEDICAL CARE IF: You have unusual pain at the radial site.  You have redness, warmth, swelling, or pain at the radial site.  You have drainage (other than a small amount of blood on the dressing).  You have chills.  You have a fever or persistent symptoms for more than 72 hours.  You have a fever and your symptoms suddenly get worse.  Your arm becomes pale, cool, tingly, or numb.  You have heavy bleeding from the site. Hold pressure on the site.   Increase activity slowly    Complete by:  As  directed       Discharge Medications   Current Discharge Medication List    START taking these medications   Details  aspirin 81 MG chewable tablet Chew 1 tablet (81 mg total) by mouth daily.    clopidogrel (PLAVIX) 75 MG tablet Take 1 tablet (75 mg total) by mouth daily with breakfast. Qty: 30 tablet, Refills: 11    pantoprazole (PROTONIX) 40 MG tablet Take 1 tablet (40 mg total) by mouth daily. Qty: 30 tablet, Refills: 11      CONTINUE these medications which have CHANGED   Details  acetaminophen (TYLENOL) 500 MG tablet Take 1 tablet (500 mg total) by mouth every 6 (six) hours as needed for moderate pain. Qty: 30 tablet, Refills: 0      CONTINUE these medications which have NOT CHANGED   Details  allopurinol (ZYLOPRIM) 300 MG tablet Take 300 mg by mouth daily as needed (for gout flare ups).     amLODipine (NORVASC) 10 MG tablet Take 1 tablet (10 mg total) by mouth daily. Qty: 90 tablet, Refills: 3    azithromycin (ZITHROMAX) 250 MG tablet dose package for sinus infection Refills: 1    Cholecalciferol (VITAMIN D3) 1000 UNITS CAPS Take 1,000 Units by mouth daily.      CIALIS 20 MG tablet TAKE ONE TABLET DAILY AS NEEDED FOR     ERECTILE DYSFUNCTION Qty: 10 tablet, Refills: 5    Coenzyme Q10 (CO Q 10) 100 MG CAPS Take 100 mg by mouth daily.      diphenhydramine-acetaminophen (TYLENOL PM) 25-500 MG TABS tablet Take 0.5 tablets by mouth at bedtime as needed (sleep).    fluticasone (FLONASE) 50 MCG/ACT nasal spray Place 2 sprays into both nostrils daily as needed for allergies or rhinitis.    levocetirizine (XYZAL) 5 MG tablet Take 5 mg by mouth daily as needed for allergies.    lisinopril (PRINIVIL,ZESTRIL) 10 MG tablet TAKE 1 TABLET BY MOUTH DAILY Qty: 30 tablet, Refills: 5    metoprolol succinate (TOPROL-XL) 50 MG 24 hr tablet Take 1 tablet (50 mg total) by mouth 2 (two) times daily. Qty: 60 tablet, Refills: 11  montelukast (SINGULAIR) 10 MG tablet Take 10 mg by  mouth at bedtime.    nitroGLYCERIN (NITROSTAT) 0.4 MG SL tablet Place 1 tablet (0.4 mg total) under the tongue every 5 (five) minutes as needed for chest pain. Qty: 25 tablet, Refills: 1    omega-3 acid ethyl esters (LOVAZA) 1 g capsule Take 1 g by mouth daily.      STOP taking these medications     aspirin EC 325 MG EC tablet      omeprazole (PRILOSEC) 40 MG capsule      atorvastatin (LIPITOR) 10 MG tablet          Aspirin prescribed at discharge?  Yes High Intensity Statin Prescribed? (Lipitor 40-80mg  or Crestor 20-40mg ): No: Intolerant (To be seen in clinic for PCSK-9 inhibitors) Beta Blocker Prescribed? Yes For EF <40%, was ACEI/ARB Prescribed? Yes ADP Receptor Inhibitor Prescribed? (i.e. Plavix etc.-Includes Medically Managed Patients): Yes For EF <40%, Aldosterone Inhibitor Prescribed? No:  Was EF assessed during THIS hospitalization? Yes Was Cardiac Rehab II ordered? (Included Medically managed Patients): Yes   Outstanding Labs/Studies   None   Duration of Discharge Encounter   Greater than 30 minutes including physician time.  Signed, [name] NP-C 11/01/2015, 9:59 AM

## 2015-11-01 NOTE — Progress Notes (Signed)
Patient Name: Leonard Edwards Date of Encounter: 11/01/2015  Hospital Problem List     Active Problems:   Exertional angina ([place])   Crescendo angina ([facility])    Subjective   Feeling well. Walked with cardiac rehab and no chest pain.   Inpatient Medications    . amLODipine  10 mg Oral Daily  . aspirin  81 mg Oral Daily  . clopidogrel  75 mg Oral Q breakfast  . lisinopril  10 mg Oral Daily  . metoprolol succinate  50 mg Oral BID  . montelukast  10 mg Oral QHS  . omega-3 acid ethyl esters  1 g Oral Daily  . pantoprazole  40 mg Oral Daily  . sodium chloride flush  3 mL Intravenous Q12H    Vital Signs    Vitals:   10/31/15 1630 10/31/15 2000 11/01/15 0502 11/01/15 0717  BP: 123/69 136/79 (!) 141/72 (!) 149/68  Pulse: (!) 51 (!) 59 (!) 53 (!) 57  Resp: 18 18 16 17   Temp:  97.3 F (36.3 C) 98 F (36.7 C) 97.5 F (36.4 C)  TempSrc:  Axillary Oral Oral  SpO2: 96% 93% 93% 93%  Weight:   244 lb 11.4 oz (111 kg)   Height:        Intake/Output Summary (Last 24 hours) at 11/01/15 [id number] Last data filed at 11/01/15 0800  Gross per 24 hour  Intake          1077.75 ml  Output              850 ml  Net           227.75 ml   Filed Weights   10/31/15 1051 11/01/15 0502  Weight: 245 lb (111.1 kg) 244 lb 11.4 oz (111 kg)    Physical Exam    General: Pleasant caucasian male, NAD. Neuro: Alert and oriented X 3. Moves all extremities spontaneously. Psych: Normal affect. HEENT:  Normal  Neck: Supple without bruits or JVD. Lungs:  Resp regular and unlabored, CTA. Heart: RRR no s3, s4, or murmurs. Abdomen: Soft, non-tender, non-distended, BS + x 4.  Extremities: No clubbing, cyanosis or edema. DP/PT/Radials 2+ and equal bilaterally. Right radial wrist stable without bruising or hematoma.  Labs    CBC  Recent Labs  10/29/15 1241 11/01/15 0433  WBC 9.2 9.6  HGB 16.4 15.2  HCT 46.6 45.2  MCV 87.9 90.4  PLT 340 0000000   Basic Metabolic Panel  Recent Labs  10/29/15 1241  11/01/15 0433  NA 141 140  K 4.4 4.0  CL 104 107  CO2 27 25  GLUCOSE 85 87  BUN 12 9  CREATININE 0.76 0.84  CALCIUM 9.9 9.1   Liver Function Tests No results for input(s): AST, ALT, ALKPHOS, BILITOT, PROT, ALBUMIN in the last 72 hours. No results for input(s): LIPASE, AMYLASE in the last 72 hours. Cardiac Enzymes No results for input(s): CKTOTAL, CKMB, CKMBINDEX, TROPONINI in the last 72 hours. BNP Invalid input(s): POCBNP D-Dimer No results for input(s): DDIMER in the last 72 hours. Hemoglobin A1C No results for input(s): HGBA1C in the last 72 hours. Fasting Lipid Panel No results for input(s): CHOL, HDL, LDLCALC, TRIG, CHOLHDL, LDLDIRECT in the last 72 hours. Thyroid Function Tests No results for input(s): TSH, T4TOTAL, T3FREE, THYROIDAB in the last 72 hours.  Invalid input(s): FREET3  Telemetry    SB  ECG    SB  Radiology    No results found.  Assessment & Plan  60 yo male with PMH of CAD with multiple stents, s/p CABG (3/17), HTN, HLD, GERD, Obesity who presented to the office as a work in for Leonard Edwards on 10/29/15 with Leonard Edwards reporting chest pain.   1. CAD s/p 3v CABG (3/17): given his report of symptoms that were worrisome for angina, case was discussed with Leonard Edwards and decision was made to proceed with repeat cath.  -- Underwent LHC yesterday with Leonard Edwards showing patent LIMA to LAD, RIMA to Ramus Intermediusm and SVG to right PDA, but new critical stenosis of the distal LCRx bifurcation which was treated with DES x3. Recommendations for long term treatment with DAPT (ASA and Plavix).   2. HTN: generally controlled, will continue with BB and amlodipine  3. HLD: not able to tolerate even low dose Lipitor in the past. Was referred to lipid clinic for PCSK-9 - cost will NOT be an issue for him fortunately.    Signed, Leonard Bellis NP-C Pager [phone]  History and all data above reviewed.  Patient examined.  I agree with the findings as  above.  No pain. No SOB The patient exam reveals COR:No distress  ,  Lungs: Clear  ,  Abd: Positive bowel sounds, no rebound no guarding, Ext left radial without bleeding or bruising.  .  All available labs, radiology testing, previous records reviewed. Agree with documented assessment and plan. OK to go home post stent to the circ.  Meds as listed.    Leonard Edwards  9:58 AM  11/01/2015

## 2015-11-01 NOTE — Progress Notes (Signed)
CARDIAC REHAB PHASE I   PRE:  Rate/Rhythm: 60 SR    BP: sitting 151/78    SaO2:   MODE:  Ambulation: 1000 ft   POST:  Rate/Rhythm: 78 SR    BP: sitting 158/85     SaO2:   Tolerated well, no c/o. Sts he needs to go home and walk his hill or walk on the beach to know if he is "fixed". Ed completed/reviewed with good reception. He works on diet and gets regular exercise. Reviewed Plavix/ASA and NTG. He is thinking about CRPII and I will send referral to [place], ACSM 11/01/2015 8:56 AM

## 2015-11-06 ENCOUNTER — Ambulatory Visit (INDEPENDENT_AMBULATORY_CARE_PROVIDER_SITE_OTHER): Payer: BLUE CROSS/BLUE SHIELD | Admitting: Nurse Practitioner

## 2015-11-06 ENCOUNTER — Encounter (INDEPENDENT_AMBULATORY_CARE_PROVIDER_SITE_OTHER): Payer: Self-pay

## 2015-11-06 ENCOUNTER — Ambulatory Visit (INDEPENDENT_AMBULATORY_CARE_PROVIDER_SITE_OTHER): Payer: BLUE CROSS/BLUE SHIELD | Admitting: Pharmacist

## 2015-11-06 ENCOUNTER — Encounter: Payer: Self-pay | Admitting: Nurse Practitioner

## 2015-11-06 VITALS — BP 160/90 | HR 67 | Ht 70.0 in | Wt 252.4 lb

## 2015-11-06 DIAGNOSIS — E785 Hyperlipidemia, unspecified: Secondary | ICD-10-CM

## 2015-11-06 DIAGNOSIS — E7800 Pure hypercholesterolemia, unspecified: Secondary | ICD-10-CM

## 2015-11-06 DIAGNOSIS — Z951 Presence of aortocoronary bypass graft: Secondary | ICD-10-CM

## 2015-11-06 DIAGNOSIS — I2510 Atherosclerotic heart disease of native coronary artery without angina pectoris: Secondary | ICD-10-CM

## 2015-11-06 DIAGNOSIS — Z955 Presence of coronary angioplasty implant and graft: Secondary | ICD-10-CM | POA: Diagnosis not present

## 2015-11-06 DIAGNOSIS — I10 Essential (primary) hypertension: Secondary | ICD-10-CM

## 2015-11-06 LAB — CBC
HCT: 45.2 % (ref 38.5–50.0)
Hemoglobin: 15.7 g/dL (ref 13.2–17.1)
MCH: 31 pg (ref 27.0–33.0)
MCHC: 34.7 g/dL (ref 32.0–36.0)
MCV: 89.3 fL (ref 80.0–100.0)
MPV: 10.4 fL (ref 7.5–12.5)
Platelets: 287 10*3/uL (ref 140–400)
RBC: 5.06 MIL/uL (ref 4.20–5.80)
RDW: 13.5 % (ref 11.0–15.0)
WBC: 7.2 10*3/uL (ref 3.8–10.8)

## 2015-11-06 LAB — BASIC METABOLIC PANEL
BUN: 14 mg/dL (ref 7–25)
CO2: 29 mmol/L (ref 20–31)
Calcium: 9.4 mg/dL (ref 8.6–10.3)
Chloride: 103 mmol/L (ref 98–110)
Creat: 0.82 mg/dL (ref 0.70–1.33)
Glucose, Bld: 111 mg/dL — ABNORMAL HIGH (ref 65–99)
Potassium: 4.5 mmol/L (ref 3.5–5.3)
Sodium: 140 mmol/L (ref 135–146)

## 2015-11-06 LAB — LIPID PANEL
Cholesterol: 157 mg/dL (ref 125–200)
HDL: 31 mg/dL — ABNORMAL LOW (ref >=40)
LDL Cholesterol: 97 mg/dL (ref <130)
Total CHOL/HDL Ratio: 5.1 Ratio — ABNORMAL HIGH (ref <=5.0)
Triglycerides: 145 mg/dL (ref <150)
VLDL: 29 mg/dL (ref <30)

## 2015-11-06 NOTE — Progress Notes (Signed)
Patient ID: Leonard Edwards                 DOB: 04/13/1955                    MRN: [id number]     HPI: Leonard Edwards is a 60 y.o. male patient referred to lipid clinic by Dr Burt Knack for rapidly progressing ASCVD. He initially presented several years ago with acute coronary syndrome and underwent PCI to right coronary artery in 2006. He developed recurrent anginal symptoms and underwent stenting of the ramus intermedius branch in 2011. In March of 2017, he experienced more chest pain and was referred for cardiac cath which showed an 80% distal LM stenosis. The ostial and proximal LAD had a 90% stenosis and there was 50% distal LAD stenosis. The RCA has an 80% ostial PDA stenosis. He was then referred on for CABG with Dr. Cyndia Bent. He had LIMA to LAD, free RIMA to ramus and SVG to PD in March of 2017. Most recently, patient presented 6 months after LHC on 10/31/15. He was found to have severe native 3 vessel CAD and critical 90% stenosis of the distal LCRx bifurcation which was treated with DES x3.    Patient has a history of statin intolerance. He previously tried Lipitor 32m daily and Crestor 178mand 4046maily. He experienced bilateral muscle aches in his arms each time. Symptoms disappeared after drug discontinuation.  Current Medications: fish oil 1g daily Intolerances: atorvastatin 55m85mily, rosuvastatin 55mg25m 40mg 50my - myalgias Risk Factors: CAD s/p multiple PCIs, quickly progressing ASCVD LDL goal: <55mg/d73mven rapid progression of ASCVD  Family History: Father with history of MI, sister with history of DM and lupus, mother with COPD.  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 Drinks 5 glasses of wine per week.  Labs: 11/06/2015: TC 157, TG 145, HDL 31, LDL 97 (no therapy) 05/22/2015: TC 132, TG 120, HDL 29, LDL 79, ALT 112, Alk phos 198. Pt had abdominal pain and LFTs were up so pt was advised to stop his statin.  Past Medical History:    Diagnosis Date  . Benign prostatic hypertrophy   . CAD (coronary artery disease)    a. DES to distal RCA in 09/2004; DES to Ramus Intermediate 10/2009 following abnormal stress test b. 9/27 PCI with DES x3 to LCrx  . Chronic bronchitis (H[facility])   [name]"  . Esophageal stricture   . Exertional angina ([facility]) 9/[place]2017  . Fibromyalgia   . Gastric polyp   . GERD (gastroesophageal reflux disease)   . Hiatal hernia   . High cholesterol   . History of gout   . Hyperlipidemia   . Hypertension   . Obesity   . Pneumonia    hx of    Current Outpatient Prescriptions on File Prior to Visit  Medication Sig Dispense Refill  . acetaminophen (TYLENOL) 500 MG tablet Take 1 tablet (500 mg total) by mouth every 6 (six) hours as needed for moderate pain. 30 tablet 0  . allopurinol (ZYLOPRIM) 300 MG tablet Take 300 mg by mouth daily as needed (for gout flare ups).     . amLODMarland Kitchenpine (NORVASC) 10 MG tablet Take 1 tablet (10 mg total) by mouth daily. 90 tablet 3  . aspirin 81 MG chewable tablet Chew 1 tablet (81 mg total) by mouth daily.    . azithMarland Kitchenomycin (ZITHROMAX) 250 MG tablet dose package for sinus  infection  1  . Cholecalciferol (VITAMIN D3) 1000 UNITS CAPS Take 1,000 Units by mouth daily.      Marland Kitchen CIALIS 20 MG tablet TAKE ONE TABLET DAILY AS NEEDED FOR     ERECTILE DYSFUNCTION 10 tablet 5  . clopidogrel (PLAVIX) 75 MG tablet Take 1 tablet (75 mg total) by mouth daily with breakfast. 30 tablet 11  . Coenzyme Q10 (CO Q 10) 100 MG CAPS Take 100 mg by mouth daily.      . diphenhydramine-acetaminophen (TYLENOL PM) 25-500 MG TABS tablet Take 0.5 tablets by mouth at bedtime as needed (sleep).    . fluticasone (FLONASE) 50 MCG/ACT nasal spray Place 2 sprays into both nostrils daily as needed for allergies or rhinitis.    Marland Kitchen levocetirizine (XYZAL) 5 MG tablet Take 5 mg by mouth daily as needed for allergies.    Marland Kitchen lisinopril (PRINIVIL,ZESTRIL) 10 MG tablet TAKE 1 TABLET BY MOUTH DAILY 30 tablet 5  .  metoprolol succinate (TOPROL-XL) 50 MG 24 hr tablet Take 1 tablet (50 mg total) by mouth 2 (two) times daily. 60 tablet 11  . montelukast (SINGULAIR) 10 MG tablet Take 10 mg by mouth at bedtime.    . nitroGLYCERIN (NITROSTAT) 0.4 MG SL tablet Place 1 tablet (0.4 mg total) under the tongue every 5 (five) minutes as needed for chest pain. 25 tablet 1  . omega-3 acid ethyl esters (LOVAZA) 1 g capsule Take 1 g by mouth daily.    . pantoprazole (PROTONIX) 40 MG tablet Take 1 tablet (40 mg total) by mouth daily. 30 tablet 11   No current facility-administered medications on file prior to visit.     No Known Allergies  Assessment/Plan:  1. Hyperlipidemia - LDL goal <91m/dL given rapid progression of ASCVD. Pt is intolerant to Lipitor 181mdaily and Crestor 105mnd 5m16mily. He needs aggressive LDL lowering. Pt self-administered first Praluent injection in clinic. Will submit paperwork for PCSK9i coverage; cost will not be an issue for pt.   Damonie Furney E. Kathaleya Mcduffee, PharmD, CPP [id number] [id number]: [phone]: [phone] 12:09 PM

## 2015-11-06 NOTE — Patient Instructions (Addendum)
It was nice to meet you.  I will start the paperwork for Praluent injections.  Your next injection is due Tuesday, October 17th.   Call Megan in lipid clinic with any questions [phone].

## 2015-11-06 NOTE — Progress Notes (Signed)
CARDIOLOGY OFFICE NOTE  Date:  11/06/2015    [name] Date of Birth: September 23, 1955 Medical Record [id number]  PCP:  Marton Redwood, MD  Cardiologist:  Jerel Shepherd   Chief Complaint  Patient presents with  . Coronary Artery Disease    Post PCI visit - seen for Dr. Burt Knack    History of Present Illness: Tariq SHAHRUKH HORROCKS is a 60 y.o. male who presents today for a post hospital visit. Seen for Dr. Burt Knack.   He has a history of CAD and hypertension. He initially presented several years ago with acute coronary syndrome and underwent PCI to right coronary artery in 2006. He developed recurrent anginal symptoms and underwent stenting of the ramus intermedius branch in 2011.  I have seen him several times over the past year or so - earlier this year in 2017 he had more chest pain - updated his Myoview which turned out abnormal and he was referred for cardiac cath - this showed an 80% distal LM stenosis. The ostial and proximal LAD had a 90% stenosis and there was 50% distal LAD stenosis. The RCA has an 80% ostial PDA stenosis. The LVEF was felt to be normal. The Ramus was large with a patent stent. The LCX is a relatively small caliber system with 60% proximal and 70% mid stenoses. He was then referred on for CABG with Dr. Cyndia Bent. He had LIMA to LAD, free RIMA to ramus and SVG to PD in March of 2017.   Post op course complicated by bronchitis and he required antibiotics.   He has done well initially after his surgery - had lost weight. Did have a visit to the ER for abdominal pain - LFTS were up - had to stop his statin. LFTs improved. Last seen in July and he was doing well.   I saw him about 2 weeks ago - having recurrent chest pain - sounded like angina - referred him on for repeat cardiac cath - got 3 DES to the LCX due to new critical stenosis of the distal LCX - his grafts are open. Recommended to have life long DAPT.   Comes in today. Here alone. Feels so much better.  No more chest pain whatsoever. BP a little better at home - he wants "6 weeks to really get on track". On DAPT - understands that this will be life long. Seeing the pharmacist today for PCSK-9 therapy. He is very agreeable to starting this therapy.   Past Medical History:  Diagnosis Date  . Benign prostatic hypertrophy   . CAD (coronary artery disease)    a. DES to distal RCA in 09/2004; DES to Ramus Intermediate 10/2009 following abnormal stress test b. 9/27 PCI with DES x3 to LCrx  . Chronic bronchitis ([place])    "in the spring"  . Esophageal stricture   . Exertional angina ([place]) 10/31/2015  . Fibromyalgia   . Gastric polyp   . GERD (gastroesophageal reflux disease)   . Hiatal hernia   . High cholesterol   . History of gout   . Hyperlipidemia   . Hypertension   . Obesity   . Pneumonia    hx of    Past Surgical History:  Procedure Laterality Date  . CARDIAC CATHETERIZATION N/A 03/29/2015   Procedure: Left Heart Cath and Coronary Angiography;  Surgeon: Sherren Mocha, MD;  Location: [facility];  Service: Cardiovascular;  Laterality: N/A;  . CARDIAC CATHETERIZATION N/A 10/31/2015   Procedure: Left Heart  Cath and Cors/Grafts Angiography;  Surgeon: Sherren Mocha, MD;  Location: [facility];  Service: Cardiovascular;  Laterality: N/A;  . CARDIAC CATHETERIZATION N/A 10/31/2015   Procedure: Coronary Stent Intervention;  Surgeon: Sherren Mocha, MD;  Location: [facility];  Service: Cardiovascular;  Laterality: N/A;  . COLONOSCOPY W/ POLYPECTOMY    . CORONARY ANGIOPLASTY WITH STENT PLACEMENT  09/2004-10/2009   DES to distal RCA in 09/2004; DES to Ramus Intermediate 10/2009 following abnormal stress test  . CORONARY ARTERY BYPASS GRAFT N/A 04/06/2015   Procedure: CORONARY ARTERY BYPASS GRAFTING (CABG) x three,  using bilateral mammaries, and right leg greater saphenous vein harvested endoscopically;  Surgeon: Gaye Pollack, MD;  Location: [facility];  Service: Open Heart Surgery;   Laterality: N/A;  . ESOPHAGOGASTRODUODENOSCOPY (EGD) WITH ESOPHAGEAL DILATION    . SHOULDER ARTHROSCOPY Right 2015   scar tissue  . TEE WITHOUT CARDIOVERSION N/A 04/06/2015   Procedure: TRANSESOPHAGEAL ECHOCARDIOGRAM (TEE);  Surgeon: Gaye Pollack, MD;  Location: [facility];  Service: Open Heart Surgery;  Laterality: N/A;  . ULTRASOUND GUIDANCE FOR VASCULAR ACCESS  03/29/2015   Procedure: Ultrasound Guidance For Vascular Access;  Surgeon: Sherren Mocha, MD;  Location: [facility];  Service: Cardiovascular;;     Medications: Current Outpatient Prescriptions  Medication Sig Dispense Refill  . acetaminophen (TYLENOL) 500 MG tablet Take 1 tablet (500 mg total) by mouth every 6 (six) hours as needed for moderate pain. 30 tablet 0  . allopurinol (ZYLOPRIM) 300 MG tablet Take 300 mg by mouth daily as needed (for gout flare ups).     Marland Kitchen amLODipine (NORVASC) 10 MG tablet Take 1 tablet (10 mg total) by mouth daily. 90 tablet 3  . aspirin 81 MG chewable tablet Chew 1 tablet (81 mg total) by mouth daily.    . Cholecalciferol (VITAMIN D3) 1000 UNITS CAPS Take 1,000 Units by mouth daily.      Marland Kitchen CIALIS 20 MG tablet TAKE ONE TABLET DAILY AS NEEDED FOR     ERECTILE DYSFUNCTION 10 tablet 5  . clopidogrel (PLAVIX) 75 MG tablet Take 1 tablet (75 mg total) by mouth daily with breakfast. 30 tablet 11  . Coenzyme Q10 (CO Q 10) 100 MG CAPS Take 100 mg by mouth daily.      . diphenhydramine-acetaminophen (TYLENOL PM) 25-500 MG TABS tablet Take 0.5 tablets by mouth at bedtime as needed (sleep).    . fluticasone (FLONASE) 50 MCG/ACT nasal spray Place 2 sprays into both nostrils daily as needed for allergies or rhinitis.    Marland Kitchen levocetirizine (XYZAL) 5 MG tablet Take 5 mg by mouth daily as needed for allergies.    Marland Kitchen lisinopril (PRINIVIL,ZESTRIL) 10 MG tablet TAKE 1 TABLET BY MOUTH DAILY 30 tablet 5  . metoprolol succinate (TOPROL-XL) 50 MG 24 hr tablet Take 1 tablet (50 mg total) by mouth 2 (two) times daily. 60 tablet  11  . montelukast (SINGULAIR) 10 MG tablet Take 10 mg by mouth at bedtime.    . nitroGLYCERIN (NITROSTAT) 0.4 MG SL tablet Place 1 tablet (0.4 mg total) under the tongue every 5 (five) minutes as needed for chest pain. 25 tablet 1  . omega-3 acid ethyl esters (LOVAZA) 1 g capsule Take 1 g by mouth daily.    . pantoprazole (PROTONIX) 40 MG tablet Take 1 tablet (40 mg total) by mouth daily. 30 tablet 11   No current facility-administered medications for this visit.     Allergies: No Known Allergies  Social History:  The patient  reports that he has never smoked. He has never used smokeless tobacco. He reports that he drinks about 3.0 oz of alcohol per week . He reports that he does not use drugs.   Family History: The patient's family history includes COPD in his mother; Diabetes type II in his sister; Heart attack in his father; Lupus in his sister.   Review of Systems: Please see the history of present illness.   Otherwise, the review of systems is positive for none.   All other systems are reviewed and negative.   Physical Exam: VS:  BP (!) 160/90   Pulse 67   Ht 5\' 10"  (1.778 m)   Wt 252 lb 6.4 oz (114.5 kg)   SpO2 98%   BMI 36.22 kg/m  .  BMI Body mass index is 36.22 kg/m.  Wt Readings from Last 3 Encounters:  11/06/15 252 lb 6.4 oz (114.5 kg)  11/01/15 244 lb 11.4 oz (111 kg)  10/29/15 251 lb 12.8 oz (114.2 kg)  BP by me is 160/90  General: Pleasant. Well developed, well nourished and in no acute distress.   HEENT: Normal.  Neck: Supple, no JVD, carotid bruits, or masses noted.  Cardiac: Regular rate and rhythm. No murmurs, rubs, or gallops. No edema.  Respiratory:  Lungs are clear to auscultation bilaterally with normal work of breathing.  GI: Soft and nontender.  MS: No deformity or atrophy. Gait and ROM intact.  Skin: Warm and dry. Color is normal.  Neuro:  Strength and sensation are intact and no gross focal deficits noted.  Psych: Alert, appropriate and with  normal affect. Left wrist looks fine.    LABORATORY DATA:  EKG:  EKG is not ordered today.  Lab Results  Component Value Date   WBC 9.6 11/01/2015   HGB 15.2 11/01/2015   HCT 45.2 11/01/2015   PLT 278 11/01/2015   GLUCOSE 87 11/01/2015   CHOL 132 05/22/2015   TRIG 120 05/22/2015   HDL 29 (L) 05/22/2015   LDLDIRECT 68.2 10/25/2010   LDLCALC 79 05/22/2015   ALT 25 06/25/2015   AST 22 06/25/2015   NA 140 11/01/2015   K 4.0 11/01/2015   CL 107 11/01/2015   CREATININE 0.84 11/01/2015   BUN 9 11/01/2015   CO2 25 11/01/2015   TSH 1.81 07/17/2014   PSA 1.03 08/29/2009   INR 1.1 10/29/2015   HGBA1C 5.9 (H) 04/04/2015    BNP (last 3 results) No results for input(s): BNP in the last 8760 hours.  ProBNP (last 3 results) No results for input(s): PROBNP in the last 8760 hours.   Other Studies Reviewed Today: Procedures   Coronary Stent Intervention  Left Heart Cath and Cors/Grafts Angiography 10/2015  Conclusion     Prox LAD lesion, 90 %stenosed.   1. Severe native three-vessel coronary artery disease with severe distal left main stenosis, severe ostial LAD stenosis, severe diffuse left circumflex stenosis, and severe right PDA stenosis  2. Status post CABG with continued patency of the LIMA to LAD, free RIMA to ramus intermedius, and SVG to right PDA  3. 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     Assessment/Plan: 1. CAD with CABG x 3 back in March of 2017 - recurrent angina which has led to repeat cardiac cath and PCI with DES x 3 to the LCX. Committed to  life long DAPT - he is improved clinically.   2. HTN - remains off HCTZ.  May need to add back. Will see what kind of progress he can make in 6 weeks. He is to continue to monitor BP at home.   3.  HLD - not able to tolerate even low dose Lipitor. Referral to lipid clinic for PCSK-9 - cost will NOT be an issue for him fortunately.   4. Obesity - this has been discussed in depth at past visits  5. Prior episode of abdominal pain with past elevated LFTs - recent check by PCP were normal.  Current medicines are reviewed with the patient today.  The patient does not have concerns regarding medicines other than what has been noted above.  The following changes have been made:  See above.  Labs/ tests ordered today include:    Orders Placed This Encounter  Procedures  . Basic metabolic panel  . CBC  . Lipid panel     Disposition:   FU with me in 6 weeks  Patient is agreeable to this plan and will call if any problems develop in the interim.   Signed: Burtis Junes, RN, ANP-C 11/06/2015 10:30 AM  [facility] [address] [place], [place]  [address] Phone: [phone] Fax: [phone]

## 2015-11-06 NOTE — Patient Instructions (Addendum)
We will be checking the following labs today - Lipids, BMET and CBC   Medication Instructions:    Continue with your current medicines.     Testing/Procedures To Be Arranged:  N/A  Follow-Up:   See me in 6 weeks    Other Special Instructions:   Ok to get back to the gym  Monitor your BP for me  Really work on your weight for me    If you need a refill on your cardiac medications before your next appointment, please call your pharmacy.   Call the [place] office at [phone] if you have any questions, problems or concerns.

## 2015-11-08 ENCOUNTER — Telehealth: Payer: Self-pay | Admitting: Pharmacist

## 2015-11-08 DIAGNOSIS — E785 Hyperlipidemia, unspecified: Secondary | ICD-10-CM

## 2015-11-08 MED ORDER — ALIROCUMAB 75 MG/ML ~~LOC~~ SOPN: 1.0000 "pen " | PEN_INJECTOR | SUBCUTANEOUS | 11 refills | Status: DC

## 2015-11-08 NOTE — Telephone Encounter (Signed)
Praluent approved 11/08/15-05/04/16. Rx sent to [facility] specialty pharmacy. Pt has been updated. Scheduled labs for next OV in November, already discussed injection technique this week when pt gave first injection in clinic.

## 2015-12-04 ENCOUNTER — Encounter: Payer: Self-pay | Admitting: Nurse Practitioner

## 2015-12-04 ENCOUNTER — Telehealth: Payer: Self-pay | Admitting: Nurse Practitioner

## 2015-12-04 NOTE — Telephone Encounter (Signed)
NEw Message  Pt wife call stating mail in prescription for pt cholesterol medication was not received. Please call back to discuss

## 2015-12-04 NOTE — Telephone Encounter (Signed)
Spoke with pt, he was never contacted by [facility] to set up his Praluent shipment. Gave him their contact info and he will call them to set up shipment.

## 2015-12-13 ENCOUNTER — Other Ambulatory Visit: Payer: Self-pay | Admitting: Cardiovascular Disease

## 2015-12-19 ENCOUNTER — Other Ambulatory Visit: Payer: BLUE CROSS/BLUE SHIELD | Admitting: *Deleted

## 2015-12-19 ENCOUNTER — Ambulatory Visit (INDEPENDENT_AMBULATORY_CARE_PROVIDER_SITE_OTHER): Payer: BLUE CROSS/BLUE SHIELD | Admitting: Nurse Practitioner

## 2015-12-19 ENCOUNTER — Ambulatory Visit: Payer: BLUE CROSS/BLUE SHIELD | Admitting: Nurse Practitioner

## 2015-12-19 ENCOUNTER — Encounter: Payer: Self-pay | Admitting: Nurse Practitioner

## 2015-12-19 VITALS — BP 146/82 | HR 67 | Resp 95 | Ht 70.0 in | Wt 252.8 lb

## 2015-12-19 DIAGNOSIS — E7800 Pure hypercholesterolemia, unspecified: Secondary | ICD-10-CM

## 2015-12-19 DIAGNOSIS — Z951 Presence of aortocoronary bypass graft: Secondary | ICD-10-CM

## 2015-12-19 DIAGNOSIS — I2510 Atherosclerotic heart disease of native coronary artery without angina pectoris: Secondary | ICD-10-CM | POA: Diagnosis not present

## 2015-12-19 DIAGNOSIS — I10 Essential (primary) hypertension: Secondary | ICD-10-CM | POA: Diagnosis not present

## 2015-12-19 DIAGNOSIS — E785 Hyperlipidemia, unspecified: Secondary | ICD-10-CM | POA: Diagnosis not present

## 2015-12-19 LAB — LIPID PANEL
Cholesterol: 154 mg/dL (ref <200)
HDL: 40 mg/dL — AB (ref >40)
LDL CALC: 87 mg/dL (ref <100)
Total CHOL/HDL Ratio: 3.9 Ratio (ref <5.0)
Triglycerides: 135 mg/dL (ref <150)
VLDL: 27 mg/dL (ref <30)

## 2015-12-19 LAB — HEPATIC FUNCTION PANEL
ALBUMIN: 4.2 g/dL (ref 3.6–5.1)
ALK PHOS: 84 U/L (ref 40–115)
ALT: 21 U/L (ref 9–46)
AST: 22 U/L (ref 10–35)
BILIRUBIN DIRECT: 0.2 mg/dL (ref <=0.2)
BILIRUBIN INDIRECT: 0.8 mg/dL (ref 0.2–1.2)
BILIRUBIN TOTAL: 1 mg/dL (ref 0.2–1.2)
Total Protein: 7.4 g/dL (ref 6.1–8.1)

## 2015-12-19 NOTE — Patient Instructions (Addendum)
We will be checking the following labs today - Lipids and HPF   Medication Instructions:    Continue with your current medicines.     Testing/Procedures To Be Arranged:  N/A  Follow-Up:   See me in 3 months    Other Special Instructions:   N/A    If you need a refill on your cardiac medications before your next appointment, please call your pharmacy.   Call the [place] office at [phone] if you have any questions, problems or concerns.

## 2015-12-19 NOTE — Progress Notes (Signed)
CARDIOLOGY OFFICE NOTE  Date:  12/19/2015    Leonard Edwards Date of Birth: [birth year] Medical Record [id number]  PCP:  Marton Redwood, MD  Cardiologist:  Jerel Shepherd    Chief Complaint  Patient presents with  . Coronary Artery Disease    Follow up visit - seen for Dr. Burt Knack    History of Present Illness: Leonard Edwards is a 60 y.o. male who presents today for a 6 week check. Seen for Dr. Burt Knack.   He has a history of CAD and hypertension. He initially presented several years ago with acute coronary syndrome and underwent PCI to right coronary artery in 2006. He developed recurrent anginal symptoms and underwent stenting of the ramus intermedius branch in 2011.  I have seen him several times over the past year or so - earlier this year in 2017 he had more chest pain - updated his Myoview which turned out abnormal and he was referred for cardiac cath - this showed an 80% distal LM stenosis. The ostial and proximal LAD had a 90% stenosis and there was 50% distal LAD stenosis. The RCA has an 80% ostial PDA stenosis. The LVEF was felt to be normal. The Ramus was large with a patent stent. The LCX is a relatively small caliber system with 60% proximal and 70% mid stenoses. He was then referred on for CABG with Dr. Cyndia Bent. He had LIMA to LAD, free RIMA to ramus and SVG to PD in March of 2017.   Post op course complicated by bronchitis and he required antibiotics.   He has done well initially after his surgery - had lost weight. Did have a visit to the ER for abdominal pain - LFTS were up - had to stop his statin. LFTs improved. Last seen in July and he was doing well.   I saw him back in September - he was having recurrent angina - referred on for repeat cath - got 3 DES to the LCX due to new critical stenosis of the distal LCX - his grafts are open. Recommended to have life long DAPT.   Last seen about 6 weeks ago - got him on PCSK-9 therapy - he was otherwise doing well.     Comes in today. Here alone. Going to the lab today. Feels good. Quite jovial today. On Praulent - tolerating well. No chest pain. Not short of breath. He says he is "getting back on track" with taking care of himself.   Past Medical History:  Diagnosis Date  . Benign prostatic hypertrophy   . CAD (coronary artery disease)    a. DES to distal RCA in 09/2004; DES to Ramus Intermediate 10/2009 following abnormal stress test b. 9/27 PCI with DES x3 to LCrx  . Chronic bronchitis ([place])    "in the spring"  . Esophageal stricture   . Exertional angina ([place]) 10/31/2015  . Fibromyalgia   . Gastric polyp   . GERD (gastroesophageal reflux disease)   . Hiatal hernia   . High cholesterol   . History of gout   . Hyperlipidemia   . Hypertension   . Obesity   . Pneumonia    hx of    Past Surgical History:  Procedure Laterality Date  . CARDIAC CATHETERIZATION N/A 03/29/2015   Procedure: Left Heart Cath and Coronary Angiography;  Surgeon: Sherren Mocha, MD;  Location: [facility];  Service: Cardiovascular;  Laterality: N/A;  . CARDIAC CATHETERIZATION N/A 10/31/2015   Procedure: Left  Heart Cath and Cors/Grafts Angiography;  Surgeon: Sherren Mocha, MD;  Location: [facility];  Service: Cardiovascular;  Laterality: N/A;  . CARDIAC CATHETERIZATION N/A 10/31/2015   Procedure: Coronary Stent Intervention;  Surgeon: Sherren Mocha, MD;  Location: [facility];  Service: Cardiovascular;  Laterality: N/A;  . COLONOSCOPY W/ POLYPECTOMY    . CORONARY ANGIOPLASTY WITH STENT PLACEMENT  09/2004-10/2009   DES to distal RCA in 09/2004; DES to Ramus Intermediate 10/2009 following abnormal stress test  . CORONARY ARTERY BYPASS GRAFT N/A 04/06/2015   Procedure: CORONARY ARTERY BYPASS GRAFTING (CABG) x three,  using bilateral mammaries, and right leg greater saphenous vein harvested endoscopically;  Surgeon: Gaye Pollack, MD;  Location: [facility];  Service: Open Heart Surgery;  Laterality: N/A;  .  ESOPHAGOGASTRODUODENOSCOPY (EGD) WITH ESOPHAGEAL DILATION    . SHOULDER ARTHROSCOPY Right 2015   scar tissue  . TEE WITHOUT CARDIOVERSION N/A 04/06/2015   Procedure: TRANSESOPHAGEAL ECHOCARDIOGRAM (TEE);  Surgeon: Gaye Pollack, MD;  Location: [facility];  Service: Open Heart Surgery;  Laterality: N/A;  . ULTRASOUND GUIDANCE FOR VASCULAR ACCESS  03/29/2015   Procedure: Ultrasound Guidance For Vascular Access;  Surgeon: Sherren Mocha, MD;  Location: [facility];  Service: Cardiovascular;;     Medications: Current Outpatient Prescriptions  Medication Sig Dispense Refill  . acetaminophen (TYLENOL) 500 MG tablet Take 1 tablet (500 mg total) by mouth every 6 (six) hours as needed for moderate pain. 30 tablet 0  . Alirocumab (PRALUENT) 75 MG/ML SOPN Inject 1 pen into the skin every 14 (fourteen) days. 2 pen 11  . allopurinol (ZYLOPRIM) 300 MG tablet Take 300 mg by mouth daily as needed (for gout flare ups).     Marland Kitchen amLODipine (NORVASC) 10 MG tablet Take 1 tablet (10 mg total) by mouth daily. 90 tablet 3  . aspirin 81 MG chewable tablet Chew 1 tablet (81 mg total) by mouth daily.    . Cholecalciferol (VITAMIN D3) 1000 UNITS CAPS Take 1,000 Units by mouth daily.      Marland Kitchen CIALIS 20 MG tablet TAKE ONE TABLET DAILY AS NEEDED FOR     ERECTILE DYSFUNCTION 10 tablet 5  . clopidogrel (PLAVIX) 75 MG tablet Take 1 tablet (75 mg total) by mouth daily with breakfast. 30 tablet 11  . Coenzyme Q10 (CO Q 10) 100 MG CAPS Take 100 mg by mouth daily.      . diphenhydramine-acetaminophen (TYLENOL PM) 25-500 MG TABS tablet Take 0.5 tablets by mouth at bedtime as needed (sleep).    . fluticasone (FLONASE) 50 MCG/ACT nasal spray Place 2 sprays into both nostrils daily as needed for allergies or rhinitis.    Marland Kitchen levocetirizine (XYZAL) 5 MG tablet Take 5 mg by mouth daily as needed for allergies.    Marland Kitchen lisinopril (PRINIVIL,ZESTRIL) 10 MG tablet TAKE 1 TABLET BY MOUTH DAILY 30 tablet 10  . metoprolol succinate (TOPROL-XL) 50 MG  24 hr tablet Take 1 tablet (50 mg total) by mouth 2 (two) times daily. 60 tablet 11  . montelukast (SINGULAIR) 10 MG tablet Take 10 mg by mouth at bedtime.    . nitroGLYCERIN (NITROSTAT) 0.4 MG SL tablet Place 1 tablet (0.4 mg total) under the tongue every 5 (five) minutes as needed for chest pain. 25 tablet 1  . omega-3 acid ethyl esters (LOVAZA) 1 g capsule Take 1 g by mouth daily.    . pantoprazole (PROTONIX) 40 MG tablet Take 1 tablet (40 mg total) by mouth daily. 30 tablet  11   No current facility-administered medications for this visit.     Allergies: No Known Allergies  Social History: The patient  reports that he has never smoked. He has never used smokeless tobacco. He reports that he drinks about 3.0 oz of alcohol per week . He reports that he does not use drugs.   Family History: The patient's family history includes COPD in his mother; Diabetes type II in his sister; Heart attack in his father; Lupus in his sister.   Review of Systems: Please see the history of present illness.   Otherwise, the review of systems is positive for none.   All other systems are reviewed and negative.   Physical Exam: VS:  BP (!) 146/82   Pulse 67   Resp (!) 95 Comment: at rest  Ht 5\' 10"  (1.778 m)   Wt 252 lb 12.8 oz (114.7 kg)   BMI 36.27 kg/m  .  BMI Body mass index is 36.27 kg/m.  Wt Readings from Last 3 Encounters:  12/19/15 252 lb 12.8 oz (114.7 kg)  11/06/15 252 lb 6.4 oz (114.5 kg)  11/01/15 244 lb 11.4 oz (111 kg)    General: Pleasant. Well developed, well nourished and in no acute distress.  Remains obese.  HEENT: Normal.  Neck: Supple, no JVD, carotid bruits, or masses noted.  Cardiac: Regular rate and rhythm. No murmurs, rubs, or gallops. No edema.  Respiratory:  Lungs are clear to auscultation bilaterally with normal work of breathing.  GI: Soft and nontender.  MS: No deformity or atrophy. Gait and ROM intact.  Skin: Warm and dry. Color is normal.  Neuro:  Strength and  sensation are intact and no gross focal deficits noted.  Psych: Alert, appropriate and with normal affect.   LABORATORY DATA:  EKG:  EKG is not ordered today.  Lab Results  Component Value Date   WBC 7.2 11/06/2015   HGB 15.7 11/06/2015   HCT 45.2 11/06/2015   PLT 287 11/06/2015   GLUCOSE 111 (H) 11/06/2015   CHOL 157 11/06/2015   TRIG 145 11/06/2015   HDL 31 (L) 11/06/2015   LDLDIRECT 68.2 10/25/2010   LDLCALC 97 11/06/2015   ALT 25 06/25/2015   AST 22 06/25/2015   NA 140 11/06/2015   K 4.5 11/06/2015   CL 103 11/06/2015   CREATININE 0.82 11/06/2015   BUN 14 11/06/2015   CO2 29 11/06/2015   TSH 1.81 07/17/2014   PSA 1.03 08/29/2009   INR 1.1 10/29/2015   HGBA1C 5.9 (H) 04/04/2015    BNP (last 3 results) No results for input(s): BNP in the last 8760 hours.  ProBNP (last 3 results) No results for input(s): PROBNP in the last 8760 hours.   Other Studies Reviewed Today:  Coronary Stent Intervention  Left Heart Cath and Cors/Grafts Angiography 10/2015  Conclusion     Prox LAD lesion, 90 %stenosed.  1. Severe native three-vessel coronary artery disease with severe distal left main stenosis, severe ostial LAD stenosis, severe diffuse left circumflex stenosis, and severe right PDA stenosis  2. Status post CABG with continued patency of the LIMA to LAD, free RIMA to ramus intermedius, and SVG to right PDA  3. 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     Assessment/Plan: 1. CAD with CABG x 3 back in March of 2017 - recurrent angina which has led  to repeat cardiac cath and PCI with DES x 3 to the LCX. Committed to life long DAPT - he is doing well clinically.   2. HTN - remains off HCTZ. May need to add back. He wants to still see what kind of  progress he can make over the next few months. He is to continue to monitor BP at home.   3. HLD - now on PCSK-9 - lab today.   4. Obesity - this has been discussed in depth at past visits  5. Prior episode of abdominal pain with past elevated LFTs - recent check by PCP were normal.   Current medicines are reviewed with the patient today.  The patient does not have concerns regarding medicines other than what has been noted above.  The following changes have been made:  See above.  Labs/ tests ordered today include:   No orders of the defined types were placed in this encounter.    Disposition:   FU with me in 3 months.   Patient is agreeable to this plan and will call if any problems develop in the interim.   Signed: Burtis Junes, RN, ANP-C 12/19/2015 9:36 AM  [facility] [address] [place], [place]  [address] Phone: [phone] Fax: [phone]

## 2015-12-20 ENCOUNTER — Other Ambulatory Visit: Payer: Self-pay | Admitting: Pharmacist

## 2015-12-20 DIAGNOSIS — E785 Hyperlipidemia, unspecified: Secondary | ICD-10-CM

## 2015-12-24 ENCOUNTER — Other Ambulatory Visit: Payer: Self-pay | Admitting: Cardiovascular Disease

## 2016-01-03 DIAGNOSIS — H5203 Hypermetropia, bilateral: Secondary | ICD-10-CM | POA: Diagnosis not present

## 2016-01-03 DIAGNOSIS — J3089 Other allergic rhinitis: Secondary | ICD-10-CM | POA: Diagnosis not present

## 2016-01-03 DIAGNOSIS — J4530 Mild persistent asthma, uncomplicated: Secondary | ICD-10-CM | POA: Diagnosis not present

## 2016-01-03 DIAGNOSIS — J3081 Allergic rhinitis due to animal (cat) (dog) hair and dander: Secondary | ICD-10-CM | POA: Diagnosis not present

## 2016-01-10 DIAGNOSIS — M5416 Radiculopathy, lumbar region: Secondary | ICD-10-CM | POA: Diagnosis not present

## 2016-01-10 DIAGNOSIS — Z6837 Body mass index (BMI) 37.0-37.9, adult: Secondary | ICD-10-CM | POA: Diagnosis not present

## 2016-01-10 DIAGNOSIS — Z9861 Coronary angioplasty status: Secondary | ICD-10-CM | POA: Diagnosis not present

## 2016-01-14 ENCOUNTER — Other Ambulatory Visit: Payer: Self-pay | Admitting: Nurse Practitioner

## 2016-01-14 MED ORDER — AMLODIPINE BESYLATE 10 MG PO TABS: 10.0000 mg | ORAL_TABLET | Freq: Every day | ORAL | 3 refills | Status: DC

## 2016-01-24 ENCOUNTER — Other Ambulatory Visit: Payer: BLUE CROSS/BLUE SHIELD

## 2016-02-12 ENCOUNTER — Other Ambulatory Visit: Payer: BLUE CROSS/BLUE SHIELD

## 2016-02-13 ENCOUNTER — Other Ambulatory Visit: Payer: Self-pay

## 2016-02-13 ENCOUNTER — Other Ambulatory Visit: Payer: BLUE CROSS/BLUE SHIELD

## 2016-02-13 ENCOUNTER — Telehealth: Payer: Self-pay | Admitting: Internal Medicine

## 2016-02-13 MED ORDER — AMOXICILLIN 500 MG PO TABS: 500.0000 mg | ORAL_TABLET | Freq: Two times a day (BID) | ORAL | 0 refills | Status: DC

## 2016-02-13 NOTE — Telephone Encounter (Signed)
I spoke with Leonard Edwards who has had a lingering URI now with productive cough. Prescribed amoxicillin 500 mg twice daily. He will keep me posted on his progress

## 2016-02-14 DIAGNOSIS — M797 Fibromyalgia: Secondary | ICD-10-CM | POA: Diagnosis not present

## 2016-02-14 DIAGNOSIS — E668 Other obesity: Secondary | ICD-10-CM | POA: Diagnosis not present

## 2016-02-14 DIAGNOSIS — M25551 Pain in right hip: Secondary | ICD-10-CM | POA: Diagnosis not present

## 2016-02-14 DIAGNOSIS — G5710 Meralgia paresthetica, unspecified lower limb: Secondary | ICD-10-CM | POA: Diagnosis not present

## 2016-02-20 ENCOUNTER — Other Ambulatory Visit: Payer: BLUE CROSS/BLUE SHIELD

## 2016-02-27 ENCOUNTER — Other Ambulatory Visit: Payer: BLUE CROSS/BLUE SHIELD | Admitting: *Deleted

## 2016-02-27 DIAGNOSIS — E785 Hyperlipidemia, unspecified: Secondary | ICD-10-CM

## 2016-02-27 DIAGNOSIS — E7800 Pure hypercholesterolemia, unspecified: Secondary | ICD-10-CM | POA: Diagnosis not present

## 2016-02-28 LAB — HEPATIC FUNCTION PANEL
ALT: 43 IU/L (ref 0–44)
AST: 27 IU/L (ref 0–40)
Albumin: 4 g/dL (ref 3.6–4.8)
Alkaline Phosphatase: 94 IU/L (ref 39–117)
BILIRUBIN TOTAL: 0.6 mg/dL (ref 0.0–1.2)
Bilirubin, Direct: 0.19 mg/dL (ref 0.00–0.40)
Total Protein: 6.9 g/dL (ref 6.0–8.5)

## 2016-02-28 LAB — LIPID PANEL
CHOLESTEROL TOTAL: 121 mg/dL (ref 100–199)
Chol/HDL Ratio: 3.5 ratio units (ref 0.0–5.0)
HDL: 35 mg/dL — AB (ref >39)
LDL Calculated: 44 mg/dL (ref 0–99)
TRIGLYCERIDES: 208 mg/dL — AB (ref 0–149)
VLDL CHOLESTEROL CAL: 42 mg/dL — AB (ref 5–40)

## 2016-03-20 ENCOUNTER — Encounter (HOSPITAL_COMMUNITY): Payer: Self-pay | Admitting: Internal Medicine

## 2016-03-20 NOTE — Progress Notes (Signed)
Mailed letter with Cardiac Re[facility] Program to pt ... KJ  °

## 2016-03-26 ENCOUNTER — Ambulatory Visit: Payer: BLUE CROSS/BLUE SHIELD | Admitting: Nurse Practitioner

## 2016-03-26 NOTE — Progress Notes (Deleted)
CARDIOLOGY OFFICE NOTE  Date:  03/26/2016    Leonard Edwards Date of Birth: 1955-07-25 Medical Record [id number]  PCP:  Marton Redwood, MD  Cardiologist:  Jerel Shepherd    No chief complaint on file.   History of Present Illness: Leonard Edwards is a 61 y.o. male who presents today for a follow up visit. Seen for Dr. Burt Knack.   He has a history of CAD and hypertension. He initially presented several years ago with acute coronary syndrome and underwent PCI to right coronary artery in 2006. He developed recurrent anginal symptoms and underwent stenting of the ramus intermedius branch in 2011.  I have seen him several times over the past year or so - earlier this year in 2017 he had more chest pain - updated his Myoview which turned out abnormal and he was referred for cardiac cath - this showed an 80% distal LM stenosis. The ostial and proximal LAD had a 90% stenosis and there was 50% distal LAD stenosis. The RCA has an 80% ostial PDA stenosis. The LVEF was felt to be normal. The Ramus was large with a patent stent. The LCX is a relatively small caliber system with 60% proximal and 70% mid stenoses. He was then referred on for CABG with Dr. Cyndia Bent. He had LIMA to LAD, free RIMA to ramus and SVG to PD in March of 2017.   Post op course complicated by bronchitis and he required antibiotics.   He has done well initially after his surgery - had lost weight. Did have a visit to the ER for abdominal pain - LFTS were up - had to stop his statin. LFTs improved. Last seen in July and he was doing well.   I saw him back in September of 2017 - he was having recurrent angina - referred on for repeat cath - got 3 DES to the LCX due to new critical stenosis of the distal LCX - his grafts are open. Recommended to have life long DAPT. Now on PCSK-9 therapy.  Last seen by me back in November - he was doing well but once again, had gotten off track with his lifestyle.   Comes in today. Here  alone.   Past Medical History:  Diagnosis Date  . Benign prostatic hypertrophy   . CAD (coronary artery disease)    a. DES to distal RCA in 09/2004; DES to Ramus Intermediate 10/2009 following abnormal stress test b. 9/27 PCI with DES x3 to LCrx  . Chronic bronchitis ([place])    "in the spring"  . Esophageal stricture   . Exertional angina ([place]) 10/31/2015  . Fibromyalgia   . Gastric polyp   . GERD (gastroesophageal reflux disease)   . Hiatal hernia   . High cholesterol   . History of gout   . Hyperlipidemia   . Hypertension   . Obesity   . Pneumonia    hx of    Past Surgical History:  Procedure Laterality Date  . CARDIAC CATHETERIZATION N/A 03/29/2015   Procedure: Left Heart Cath and Coronary Angiography;  Surgeon: Sherren Mocha, MD;  Location: [facility];  Service: Cardiovascular;  Laterality: N/A;  . CARDIAC CATHETERIZATION N/A 10/31/2015   Procedure: Left Heart Cath and Cors/Grafts Angiography;  Surgeon: Sherren Mocha, MD;  Location: [facility];  Service: Cardiovascular;  Laterality: N/A;  . CARDIAC CATHETERIZATION N/A 10/31/2015   Procedure: Coronary Stent Intervention;  Surgeon: Sherren Mocha, MD;  Location: [facility];  Service: Cardiovascular;  Laterality: N/A;  . COLONOSCOPY W/ POLYPECTOMY    . CORONARY ANGIOPLASTY WITH STENT PLACEMENT  09/2004-10/2009   DES to distal RCA in 09/2004; DES to Ramus Intermediate 10/2009 following abnormal stress test  . CORONARY ARTERY BYPASS GRAFT N/A 04/06/2015   Procedure: CORONARY ARTERY BYPASS GRAFTING (CABG) x three,  using bilateral mammaries, and right leg greater saphenous vein harvested endoscopically;  Surgeon: Gaye Pollack, MD;  Location: [facility];  Service: Open Heart Surgery;  Laterality: N/A;  . ESOPHAGOGASTRODUODENOSCOPY (EGD) WITH ESOPHAGEAL DILATION    . SHOULDER ARTHROSCOPY Right 2015   scar tissue  . TEE WITHOUT CARDIOVERSION N/A 04/06/2015   Procedure: TRANSESOPHAGEAL ECHOCARDIOGRAM (TEE);  Surgeon: Gaye Pollack, MD;  Location: [facility];  Service: Open Heart Surgery;  Laterality: N/A;  . ULTRASOUND GUIDANCE FOR VASCULAR ACCESS  03/29/2015   Procedure: Ultrasound Guidance For Vascular Access;  Surgeon: Sherren Mocha, MD;  Location: [facility];  Service: Cardiovascular;;     Medications: Current Outpatient Prescriptions  Medication Sig Dispense Refill  . acetaminophen (TYLENOL) 500 MG tablet Take 1 tablet (500 mg total) by mouth every 6 (six) hours as needed for moderate pain. 30 tablet 0  . Alirocumab (PRALUENT) 75 MG/ML SOPN Inject 1 pen into the skin every 14 (fourteen) days. 2 pen 11  . allopurinol (ZYLOPRIM) 300 MG tablet Take 300 mg by mouth daily as needed (for gout flare ups).     Marland Kitchen amLODipine (NORVASC) 10 MG tablet Take 1 tablet (10 mg total) by mouth daily. 90 tablet 3  . amoxicillin (AMOXIL) 500 MG tablet Take 1 tablet (500 mg total) by mouth 2 (two) times daily. 20 tablet 0  . aspirin 81 MG chewable tablet Chew 1 tablet (81 mg total) by mouth daily.    . Cholecalciferol (VITAMIN D3) 1000 UNITS CAPS Take 1,000 Units by mouth daily.      Marland Kitchen CIALIS 20 MG tablet TAKE ONE TABLET DAILY AS NEEDED FOR     ERECTILE DYSFUNCTION 10 tablet 5  . clopidogrel (PLAVIX) 75 MG tablet Take 1 tablet (75 mg total) by mouth daily with breakfast. 30 tablet 11  . Coenzyme Q10 (CO Q 10) 100 MG CAPS Take 100 mg by mouth daily.      . diphenhydramine-acetaminophen (TYLENOL PM) 25-500 MG TABS tablet Take 0.5 tablets by mouth at bedtime as needed (sleep).    . fluticasone (FLONASE) 50 MCG/ACT nasal spray Place 2 sprays into both nostrils daily as needed for allergies or rhinitis.    Marland Kitchen levocetirizine (XYZAL) 5 MG tablet Take 5 mg by mouth daily as needed for allergies.    Marland Kitchen lisinopril (PRINIVIL,ZESTRIL) 10 MG tablet TAKE 1 TABLET BY MOUTH DAILY 30 tablet 10  . metoprolol succinate (TOPROL-XL) 50 MG 24 hr tablet TAKE 1 TABLET BY MOUTH TWICE DAILY 60 tablet 11  . montelukast (SINGULAIR) 10 MG tablet Take 10 mg  by mouth at bedtime.    . nitroGLYCERIN (NITROSTAT) 0.4 MG SL tablet Place 1 tablet (0.4 mg total) under the tongue every 5 (five) minutes as needed for chest pain. 25 tablet 1  . omega-3 acid ethyl esters (LOVAZA) 1 g capsule Take 1 g by mouth daily.    . pantoprazole (PROTONIX) 40 MG tablet Take 1 tablet (40 mg total) by mouth daily. 30 tablet 11   No current facility-administered medications for this visit.     Allergies: No Known Allergies  Social History: The patient  reports that he has never  smoked. He has never used smokeless tobacco. He reports that he drinks about 3.0 oz of alcohol per week . He reports that he does not use drugs.   Family History: The patient's family history includes COPD in his mother; Diabetes type II in his sister; Heart attack in his father; Lupus in his sister.   Review of Systems: Please see the history of present illness.   Otherwise, the review of systems is positive for none.   All other systems are reviewed and negative.   Physical Exam: VS:  There were no vitals taken for this visit. Marland Kitchen  BMI There is no height or weight on file to calculate BMI.  Wt Readings from Last 3 Encounters:  12/19/15 252 lb 12.8 oz (114.7 kg)  11/06/15 252 lb 6.4 oz (114.5 kg)  11/01/15 244 lb 11.4 oz (111 kg)    General: Pleasant. Well developed, well nourished and in no acute distress.   HEENT: Normal.  Neck: Supple, no JVD, carotid bruits, or masses noted.  Cardiac: Regular rate and rhythm. No murmurs, rubs, or gallops. No edema.  Respiratory:  Lungs are clear to auscultation bilaterally with normal work of breathing.  GI: Soft and nontender.  MS: No deformity or atrophy. Gait and ROM intact.  Skin: Warm and dry. Color is normal.  Neuro:  Strength and sensation are intact and no gross focal deficits noted.  Psych: Alert, appropriate and with normal affect.   LABORATORY DATA:  EKG:  EKG is not ordered today.   Lab Results  Component Value Date   WBC 7.2  11/06/2015   HGB 15.7 11/06/2015   HCT 45.2 11/06/2015   PLT 287 11/06/2015   GLUCOSE 111 (H) 11/06/2015   CHOL 121 02/27/2016   TRIG 208 (H) 02/27/2016   HDL 35 (L) 02/27/2016   LDLDIRECT 68.2 10/25/2010   LDLCALC 44 02/27/2016   ALT 43 02/27/2016   AST 27 02/27/2016   NA 140 11/06/2015   K 4.5 11/06/2015   CL 103 11/06/2015   CREATININE 0.82 11/06/2015   BUN 14 11/06/2015   CO2 29 11/06/2015   TSH 1.81 07/17/2014   PSA 1.03 08/29/2009   INR 1.1 10/29/2015   HGBA1C 5.9 (H) 04/04/2015    BNP (last 3 results) No results for input(s): BNP in the last 8760 hours.  ProBNP (last 3 results) No results for input(s): PROBNP in the last 8760 hours.   Other Studies Reviewed Today:   Assessment/Plan: Coronary Stent Intervention  Left Heart Cath and Cors/Grafts Angiography 10/2015  Conclusion     Prox LAD lesion, 90 %stenosed.  1. Severe native three-vessel coronary artery disease with severe distal left main stenosis, severe ostial LAD stenosis, severe diffuse left circumflex stenosis, and severe right PDA stenosis  2. Status post CABG with continued patency of the LIMA to LAD, free RIMA to ramus intermedius, and SVG to right PDA  3. 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     Assessment/Plan:  1. CAD with CABG x 3 back in March of 2017 - recurrent angina which has led to repeat cardiac cath and PCI with DES x 3 to the LCX. Committed to life long DAPT - he is doing well clinically.   2. HTN - remains off HCTZ. May need to add back. He wants to still see what kind of progress he can make  over the next few months. He is to continue to monitor BP at home.   3. HLD - now on PCSK-9 - lab today.   4. Obesity - this has been discussed in depth at past  visits  5. Prior episode of abdominal pain with past elevated LFTs - recent check by PCP were normal.    Current medicines are reviewed with the patient today.  The patient does not have concerns regarding medicines other than what has been noted above.  The following changes have been made:  See above.  Labs/ tests ordered today include:   No orders of the defined types were placed in this encounter.    Disposition:   FU with *** in {gen number [id number] {Days to years:10300}.   Patient is agreeable to this plan and will call if any problems develop in the interim.   SignedTruitt Merle, NP  03/26/2016 8:39 AM  [facility] [address] [place], [place]  [address] Phone: [phone] Fax: [phone]

## 2016-03-27 DIAGNOSIS — M5441 Lumbago with sciatica, right side: Secondary | ICD-10-CM | POA: Diagnosis not present

## 2016-03-27 DIAGNOSIS — M5442 Lumbago with sciatica, left side: Secondary | ICD-10-CM | POA: Diagnosis not present

## 2016-03-27 DIAGNOSIS — G8929 Other chronic pain: Secondary | ICD-10-CM | POA: Diagnosis not present

## 2016-03-27 DIAGNOSIS — M25552 Pain in left hip: Secondary | ICD-10-CM | POA: Diagnosis not present

## 2016-03-27 DIAGNOSIS — M25551 Pain in right hip: Secondary | ICD-10-CM | POA: Diagnosis not present

## 2016-04-03 DIAGNOSIS — G8929 Other chronic pain: Secondary | ICD-10-CM | POA: Diagnosis not present

## 2016-04-03 DIAGNOSIS — M5442 Lumbago with sciatica, left side: Secondary | ICD-10-CM | POA: Diagnosis not present

## 2016-04-03 DIAGNOSIS — M5441 Lumbago with sciatica, right side: Secondary | ICD-10-CM | POA: Diagnosis not present

## 2016-04-08 ENCOUNTER — Ambulatory Visit: Payer: Self-pay | Admitting: Nurse Practitioner

## 2016-04-08 DIAGNOSIS — M5441 Lumbago with sciatica, right side: Secondary | ICD-10-CM | POA: Diagnosis not present

## 2016-04-08 DIAGNOSIS — M25551 Pain in right hip: Secondary | ICD-10-CM | POA: Diagnosis not present

## 2016-04-08 DIAGNOSIS — M5442 Lumbago with sciatica, left side: Secondary | ICD-10-CM | POA: Diagnosis not present

## 2016-04-08 DIAGNOSIS — M25552 Pain in left hip: Secondary | ICD-10-CM | POA: Diagnosis not present

## 2016-04-21 NOTE — Progress Notes (Deleted)
CARDIOLOGY OFFICE NOTE  Date:  04/21/2016    Leonard Edwards Date of Birth: September 12, 1955 Medical Record #[id number]  PCP:  Marton Redwood, MD  Cardiologist:  Servando Snare & ***    No chief complaint on file.   History of Present Illness: Leonard Edwards is a 61 y.o. male who presents today for a ***  Seen for Dr. Burt Knack.   He has a history of CAD and hypertension. He initially presented several years ago with acute coronary syndrome and underwent PCI to right coronary artery in 2006. He developed recurrent anginal symptoms and underwent stenting of the ramus intermedius branch in 2011.  I have seen him several times over the past year or so - earlier this year in 2017 he had more chest pain - updated his Myoview which turned out abnormal and he was referred for cardiac cath - this showed an 80% distal LM stenosis. The ostial and proximal LAD had a 90% stenosis and there was 50% distal LAD stenosis. The RCA has an 80% ostial PDA stenosis. The LVEF was felt to be normal. The Ramus was large with a patent stent. The LCX is a relatively small caliber system with 60% proximal and 70% mid stenoses. He was then referred on for CABG with Dr. Cyndia Bent. He had LIMA to LAD, free RIMA to ramus and SVG to PD in March of 2017.   Post op course complicated by bronchitis and he required antibiotics.   He has done well initially after his surgery - had lost weight. Did have a visit to the ER for abdominal pain - LFTS were up - had to stop his statin. LFTs improved. Last seen in July and he was doing well.   I saw him back in September - he was having recurrent angina - referred on for repeat cath - got 3 DES to the LCX due to new critical stenosis of the distal LCX - his grafts are open. Recommended to have life long DAPT.   Last seen about 6 weeks ago - got him on PCSK-9 therapy - he was otherwise doing well.   Comes in today. Here alone. Going to the lab today. Feels good. Quite jovial today. On  Praulent - tolerating well. No chest pain. Not short of breath. He says he is "getting back on track" with taking care of himself.  Comes in today. Here with   Past Medical History:  Diagnosis Date  . Benign prostatic hypertrophy   . CAD (coronary artery disease)    a. DES to distal RCA in 09/2004; DES to Ramus Intermediate 10/2009 following abnormal stress test b. 9/27 PCI with DES x3 to LCrx  . Chronic bronchitis ([place])    "in the spring"  . Esophageal stricture   . Exertional angina ([facility]) 10/31/2015  . Fibromyalgia   . Gastric polyp   . GERD (gastroesophageal reflux disease)   . Hiatal hernia   . High cholesterol   . History of gout   . Hyperlipidemia   . Hypertension   . Obesity   . Pneumonia    hx of    Past Surgical History:  Procedure Laterality Date  . CARDIAC CATHETERIZATION N/A 03/29/2015   Procedure: Left Heart Cath and Coronary Angiography;  Surgeon: Sherren Mocha, MD;  Location: [facility];  Service: Cardiovascular;  Laterality: N/A;  . CARDIAC CATHETERIZATION N/A 10/31/2015   Procedure: Left Heart Cath and Cors/Grafts Angiography;  Surgeon: Sherren Mocha, MD;  Location: [facility];  Service: Cardiovascular;  Laterality: N/A;  . CARDIAC CATHETERIZATION N/A 10/31/2015   Procedure: Coronary Stent Intervention;  Surgeon: Sherren Mocha, MD;  Location: [facility];  Service: Cardiovascular;  Laterality: N/A;  . COLONOSCOPY W/ POLYPECTOMY    . CORONARY ANGIOPLASTY WITH STENT PLACEMENT  09/2004-10/2009   DES to distal RCA in 09/2004; DES to Ramus Intermediate 10/2009 following abnormal stress test  . CORONARY ARTERY BYPASS GRAFT N/A 04/06/2015   Procedure: CORONARY ARTERY BYPASS GRAFTING (CABG) x three,  using bilateral mammaries, and right leg greater saphenous vein harvested endoscopically;  Surgeon: Gaye Pollack, MD;  Location: [facility];  Service: Open Heart Surgery;  Laterality: N/A;  . ESOPHAGOGASTRODUODENOSCOPY (EGD) WITH ESOPHAGEAL DILATION    .  SHOULDER ARTHROSCOPY Right 2015   scar tissue  . TEE WITHOUT CARDIOVERSION N/A 04/06/2015   Procedure: TRANSESOPHAGEAL ECHOCARDIOGRAM (TEE);  Surgeon: Gaye Pollack, MD;  Location: [facility];  Service: Open Heart Surgery;  Laterality: N/A;  . ULTRASOUND GUIDANCE FOR VASCULAR ACCESS  03/29/2015   Procedure: Ultrasound Guidance For Vascular Access;  Surgeon: Sherren Mocha, MD;  Location: [facility];  Service: Cardiovascular;;     Medications: Current Outpatient Prescriptions  Medication Sig Dispense Refill  . acetaminophen (TYLENOL) 500 MG tablet Take 1 tablet (500 mg total) by mouth every 6 (six) hours as needed for moderate pain. 30 tablet 0  . Alirocumab (PRALUENT) 75 MG/ML SOPN Inject 1 pen into the skin every 14 (fourteen) days. 2 pen 11  . allopurinol (ZYLOPRIM) 300 MG tablet Take 300 mg by mouth daily as needed (for gout flare ups).     Marland Kitchen amLODipine (NORVASC) 10 MG tablet Take 1 tablet (10 mg total) by mouth daily. 90 tablet 3  . amoxicillin (AMOXIL) 500 MG tablet Take 1 tablet (500 mg total) by mouth 2 (two) times daily. 20 tablet 0  . aspirin 81 MG chewable tablet Chew 1 tablet (81 mg total) by mouth daily.    . Cholecalciferol (VITAMIN D3) 1000 UNITS CAPS Take 1,000 Units by mouth daily.      Marland Kitchen CIALIS 20 MG tablet TAKE ONE TABLET DAILY AS NEEDED FOR     ERECTILE DYSFUNCTION 10 tablet 5  . clopidogrel (PLAVIX) 75 MG tablet Take 1 tablet (75 mg total) by mouth daily with breakfast. 30 tablet 11  . Coenzyme Q10 (CO Q 10) 100 MG CAPS Take 100 mg by mouth daily.      . diphenhydramine-acetaminophen (TYLENOL PM) 25-500 MG TABS tablet Take 0.5 tablets by mouth at bedtime as needed (sleep).    . fluticasone (FLONASE) 50 MCG/ACT nasal spray Place 2 sprays into both nostrils daily as needed for allergies or rhinitis.    Marland Kitchen levocetirizine (XYZAL) 5 MG tablet Take 5 mg by mouth daily as needed for allergies.    Marland Kitchen lisinopril (PRINIVIL,ZESTRIL) 10 MG tablet TAKE 1 TABLET BY MOUTH DAILY 30 tablet  10  . metoprolol succinate (TOPROL-XL) 50 MG 24 hr tablet TAKE 1 TABLET BY MOUTH TWICE DAILY 60 tablet 11  . montelukast (SINGULAIR) 10 MG tablet Take 10 mg by mouth at bedtime.    . nitroGLYCERIN (NITROSTAT) 0.4 MG SL tablet Place 1 tablet (0.4 mg total) under the tongue every 5 (five) minutes as needed for chest pain. 25 tablet 1  . omega-3 acid ethyl esters (LOVAZA) 1 g capsule Take 1 g by mouth daily.    . pantoprazole (PROTONIX) 40 MG tablet Take 1 tablet (40 mg total) by mouth  daily. 30 tablet 11   No current facility-administered medications for this visit.     Allergies: No Known Allergies  Social History: The patient  reports that he has never smoked. He has never used smokeless tobacco. He reports that he drinks about 3.0 oz of alcohol per week . He reports that he does not use drugs.   Family History: The patient's ***family history includes COPD in his mother; Diabetes type II in his sister; Heart attack in his father; Lupus in his sister.   Review of Systems: Please see the history of present illness.   Otherwise, the review of systems is positive for {NONE DEFAULTED:18576::"none"}.   All other systems are reviewed and negative.   Physical Exam: VS:  There were no vitals taken for this visit. Marland Kitchen  BMI There is no height or weight on file to calculate BMI.  Wt Readings from Last 3 Encounters:  12/19/15 252 lb 12.8 oz (114.7 kg)  11/06/15 252 lb 6.4 oz (114.5 kg)  11/01/15 244 lb 11.4 oz (111 kg)    General: Pleasant. Well developed, well nourished and in no acute distress.   HEENT: Normal.  Neck: Supple, no JVD, carotid bruits, or masses noted.  Cardiac: ***Regular rate and rhythm. No murmurs, rubs, or gallops. No edema.  Respiratory:  Lungs are clear to auscultation bilaterally with normal work of breathing.  GI: Soft and nontender.  MS: No deformity or atrophy. Gait and ROM intact.  Skin: Warm and dry. Color is normal.  Neuro:  Strength and sensation are intact and  no gross focal deficits noted.  Psych: Alert, appropriate and with normal affect.   LABORATORY DATA:  EKG:  EKG {ACTION; IS/IS [id number]} ordered today. This demonstrates ***.  Lab Results  Component Value Date   WBC 7.2 11/06/2015   HGB 15.7 11/06/2015   HCT 45.2 11/06/2015   PLT 287 11/06/2015   GLUCOSE 111 (H) 11/06/2015   CHOL 121 02/27/2016   TRIG 208 (H) 02/27/2016   HDL 35 (L) 02/27/2016   LDLDIRECT 68.2 10/25/2010   LDLCALC 44 02/27/2016   ALT 43 02/27/2016   AST 27 02/27/2016   NA 140 11/06/2015   K 4.5 11/06/2015   CL 103 11/06/2015   CREATININE 0.82 11/06/2015   BUN 14 11/06/2015   CO2 29 11/06/2015   TSH 1.81 07/17/2014   PSA 1.03 08/29/2009   INR 1.1 10/29/2015   HGBA1C 5.9 (H) 04/04/2015    BNP (last 3 results) No results for input(s): BNP in the last 8760 hours.  ProBNP (last 3 results) No results for input(s): PROBNP in the last 8760 hours.   Other Studies Reviewed Today:  Coronary Stent Intervention  Left Heart Cath and Cors/Grafts Angiography 10/2015  Conclusion     Prox LAD lesion, 90 %stenosed.  1. Severe native three-vessel coronary artery disease with severe distal left main stenosis, severe ostial LAD stenosis, severe diffuse left circumflex stenosis, and severe right PDA stenosis  2. Status post CABG with continued patency of the LIMA to LAD, free RIMA to ramus intermedius, and SVG to right PDA  3. 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     Assessment/Plan: 1. CAD with CABG x 3 back in March of 2017 - recurrent angina which has led to repeat cardiac cath and PCI with DES x 3 to the LCX. Committed to life  long DAPT - he is doing well clinically.   2. HTN - remains off HCTZ. May need to add back. He wants to  still see what kind of progress he can make over the next few months. He is to continue to monitor BP at home.   3. HLD - now on PCSK-9 - lab today.   4. Obesity - this has been discussed in depth at past visits  5. Prior episode of abdominal pain with past elevated LFTs - recent check by PCP were normal.    Current medicines are reviewed with the patient today.  The patient does not have concerns regarding medicines other than what has been noted above.  The following changes have been made:  See above.  Labs/ tests ordered today include:   No orders of the defined types were placed in this encounter.    Disposition:   FU with *** in {gen number [id number]} {Days to years:10300}.   Patient is agreeable to this plan and will call if any problems develop in the interim.   SignedTruitt Merle, NP  04/21/2016 3:54 PM  [facility] [address] [place], [place]  [address] Phone: [phone] Fax: [phone]

## 2016-04-22 ENCOUNTER — Ambulatory Visit: Payer: Self-pay | Admitting: Nurse Practitioner

## 2016-04-23 ENCOUNTER — Encounter: Payer: Self-pay | Admitting: Nurse Practitioner

## 2016-05-21 ENCOUNTER — Telehealth: Payer: Self-pay | Admitting: *Deleted

## 2016-05-21 NOTE — Telephone Encounter (Signed)
Received fax from [facility] approval for [place] dates 05/19/2016-05/18/2017

## 2016-06-11 DIAGNOSIS — M5136 Other intervertebral disc degeneration, lumbar region: Secondary | ICD-10-CM | POA: Diagnosis not present

## 2016-06-12 ENCOUNTER — Telehealth: Payer: Self-pay | Admitting: Nurse Practitioner

## 2016-06-12 NOTE — Telephone Encounter (Signed)
[facility] need authorization for Praluent 75mg  for pt. Please fax to [phone]

## 2016-06-13 NOTE — Telephone Encounter (Signed)
Spoke to [place] at [facility] and informed her that rejection is for refill too soon. She states that she can now see that on her end and should be able to process next week.

## 2016-06-13 NOTE — Telephone Encounter (Signed)
Spoke with Audrie Gallus at [facility] - she states Leonard Edwards is approved 05/19/16-05/18/17. She is calling pharmacy as the error appears to be on their end. She will call back once resolved and pharmacy can fill.

## 2016-08-11 ENCOUNTER — Telehealth: Payer: Self-pay | Admitting: Cardiovascular Disease

## 2016-08-11 NOTE — Telephone Encounter (Signed)
New emssage   Pt c/o medication issue:  1. Name of Medication: Praluent  2. How are you currently taking this medication (dosage and times per day)? 75mg  PEN 1x injection every 14 days  3. Are you having a reaction (difficulty breathing--STAT)? no  4. What is your medication issue? ICD- CODE is needed

## 2016-08-11 NOTE — Telephone Encounter (Signed)
Called specialty pharmacy back and provided ICD 10 codes of E78.5 and I25.10 so that Praluent can be filled.

## 2016-10-20 DIAGNOSIS — J018 Other acute sinusitis: Secondary | ICD-10-CM | POA: Diagnosis not present

## 2016-11-05 ENCOUNTER — Other Ambulatory Visit: Payer: Self-pay | Admitting: *Deleted

## 2016-11-05 MED ORDER — ALIROCUMAB 75 MG/ML ~~LOC~~ SOPN: 1.0000 "pen " | PEN_INJECTOR | SUBCUTANEOUS | 11 refills | Status: DC

## 2016-11-11 ENCOUNTER — Ambulatory Visit (INDEPENDENT_AMBULATORY_CARE_PROVIDER_SITE_OTHER): Payer: BLUE CROSS/BLUE SHIELD | Admitting: Internal Medicine

## 2016-11-11 ENCOUNTER — Other Ambulatory Visit (INDEPENDENT_AMBULATORY_CARE_PROVIDER_SITE_OTHER): Payer: BLUE CROSS/BLUE SHIELD

## 2016-11-11 ENCOUNTER — Ambulatory Visit (HOSPITAL_BASED_OUTPATIENT_CLINIC_OR_DEPARTMENT_OTHER)
Admission: RE | Admit: 2016-11-11 | Discharge: 2016-11-11 | Disposition: A | Discharge status: Home / Self Care | Payer: BLUE CROSS/BLUE SHIELD | Source: Ambulatory Visit | Attending: Internal Medicine | Admitting: Internal Medicine

## 2016-11-11 ENCOUNTER — Encounter (HOSPITAL_BASED_OUTPATIENT_CLINIC_OR_DEPARTMENT_OTHER): Payer: Self-pay

## 2016-11-11 ENCOUNTER — Encounter: Payer: Self-pay | Admitting: Internal Medicine

## 2016-11-11 VITALS — BP 140/84 | HR 64 | Ht 70.0 in | Wt 258.0 lb

## 2016-11-11 DIAGNOSIS — K219 Gastro-esophageal reflux disease without esophagitis: Secondary | ICD-10-CM

## 2016-11-11 DIAGNOSIS — K8020 Calculus of gallbladder without cholecystitis without obstruction: Secondary | ICD-10-CM | POA: Diagnosis not present

## 2016-11-11 DIAGNOSIS — K5732 Diverticulitis of large intestine without perforation or abscess without bleeding: Secondary | ICD-10-CM

## 2016-11-11 DIAGNOSIS — R109 Unspecified abdominal pain: Secondary | ICD-10-CM | POA: Diagnosis not present

## 2016-11-11 DIAGNOSIS — K5730 Diverticulosis of large intestine without perforation or abscess without bleeding: Secondary | ICD-10-CM | POA: Diagnosis not present

## 2016-11-11 DIAGNOSIS — R1030 Lower abdominal pain, unspecified: Secondary | ICD-10-CM

## 2016-11-11 DIAGNOSIS — N200 Calculus of kidney: Secondary | ICD-10-CM | POA: Insufficient documentation

## 2016-11-11 DIAGNOSIS — K4020 Bilateral inguinal hernia, without obstruction or gangrene, not specified as recurrent: Secondary | ICD-10-CM | POA: Insufficient documentation

## 2016-11-11 LAB — CBC WITH DIFFERENTIAL/PLATELET
BASOS PCT: 0.7 % (ref 0.0–3.0)
Basophils Absolute: 0.1 10*3/uL (ref 0.0–0.1)
EOS ABS: 0.2 10*3/uL (ref 0.0–0.7)
EOS PCT: 1.2 % (ref 0.0–5.0)
HEMATOCRIT: 48.9 % (ref 39.0–52.0)
HEMOGLOBIN: 16.6 g/dL (ref 13.0–17.0)
LYMPHS PCT: 18.9 % (ref 12.0–46.0)
Lymphs Abs: 2.4 10*3/uL (ref 0.7–4.0)
MCHC: 34.1 g/dL (ref 30.0–36.0)
MCV: 91.3 fl (ref 78.0–100.0)
MONOS PCT: 13.6 % — AB (ref 3.0–12.0)
Monocytes Absolute: 1.7 10*3/uL — ABNORMAL HIGH (ref 0.1–1.0)
Neutro Abs: 8.4 10*3/uL — ABNORMAL HIGH (ref 1.4–7.7)
Neutrophils Relative %: 65.6 % (ref 43.0–77.0)
Platelets: 325 10*3/uL (ref 150.0–400.0)
RBC: 5.35 Mil/uL (ref 4.22–5.81)
RDW: 13.4 % (ref 11.5–15.5)
WBC: 12.8 10*3/uL — AB (ref 4.0–10.5)

## 2016-11-11 LAB — URINALYSIS
Bilirubin Urine: NEGATIVE
HGB URINE DIPSTICK: NEGATIVE
Ketones, ur: NEGATIVE
Leukocytes, UA: NEGATIVE
NITRITE: NEGATIVE
Specific Gravity, Urine: 1.01 (ref 1.000–1.030)
TOTAL PROTEIN, URINE-UPE24: NEGATIVE
Urine Glucose: NEGATIVE
Urobilinogen, UA: 0.2 (ref 0.0–1.0)
pH: 6.5 (ref 5.0–8.0)

## 2016-11-11 LAB — COMPREHENSIVE METABOLIC PANEL
ALBUMIN: 4.4 g/dL (ref 3.5–5.2)
ALT: 24 U/L (ref 0–53)
AST: 16 U/L (ref 0–37)
Alkaline Phosphatase: 96 U/L (ref 39–117)
BUN: 11 mg/dL (ref 6–23)
CALCIUM: 9.7 mg/dL (ref 8.4–10.5)
CHLORIDE: 101 meq/L (ref 96–112)
CO2: 30 mEq/L (ref 19–32)
CREATININE: 0.82 mg/dL (ref 0.40–1.50)
GFR: 101.61 mL/min (ref >60.00)
Glucose, Bld: 105 mg/dL — ABNORMAL HIGH (ref 70–99)
POTASSIUM: 4.4 meq/L (ref 3.5–5.1)
Sodium: 138 mEq/L (ref 135–145)
Total Bilirubin: 1.5 mg/dL — ABNORMAL HIGH (ref 0.2–1.2)
Total Protein: 8 g/dL (ref 6.0–8.3)

## 2016-11-11 LAB — LIPASE: Lipase: 34 U/L (ref 11.0–59.0)

## 2016-11-11 MED ORDER — IOPAMIDOL (ISOVUE-300) INJECTION 61%
100.0000 mL | Freq: Once | INTRAVENOUS | Status: AC | PRN
  Administered 2016-11-11: 100 mL via INTRAVENOUS

## 2016-11-11 MED ORDER — PANTOPRAZOLE SODIUM 40 MG PO TBEC
40.0000 mg | DELAYED_RELEASE_TABLET | Freq: Every day | ORAL | 11 refills | Status: DC
Start: 2016-11-11 — End: 2017-12-08

## 2016-11-11 MED ORDER — CIPROFLOXACIN HCL 500 MG PO TABS: 500.0000 mg | ORAL_TABLET | Freq: Two times a day (BID) | ORAL | 0 refills | Status: DC

## 2016-11-11 NOTE — Progress Notes (Signed)
HISTORY OF PRESENT ILLNESS:  Leonard Edwards is a 61 y.o. male with multiple medical problems as listed below who is worked into today's office for evaluation of abdominal pain. He has a history of GERD for which she takes pantoprazole daily. He states he was in his usual state of health until approximately 2 weeks ago when he developed problems with nausea and possibly vague abdominal discomfort. Last week purchase Pepto-Bismol. Was placed on Augmentin for upper respiratory illness. Completed yesterday. Over the weekend began to develop problems with intermittent abdominal pain. Initially described as sharp and associated with nausea as well as decreased appetite. Severe episode last evening which last for hours and is more cramping in nature no exacerbating or relieving factors.. No fevers. No urinary complaints. No upper abdominal pain. His last colonoscopy was performed January 2015. In addition to a diminutive colon polyp he is known to have pandiverticulosis. CT scan performed April 2017 1 month after open heart surgery, to evaluate right-sided abdominal pain, was negative except for incidental umbilical and inguinal hernias. He states that his discomfort is present currently that has improved somewhat  REVIEW OF SYSTEMS:  All non-GI ROS negative except for sinus and allergies, back pain  Past Medical History:  Diagnosis Date  . Benign prostatic hypertrophy   . CAD (coronary artery disease)    a. DES to distal RCA in 09/2004; DES to Ramus Intermediate 10/2009 following abnormal stress test b. 9/27 PCI with DES x3 to LCrx  . Chronic bronchitis ([place])    "in the spring"  . Esophageal stricture   . Exertional angina ([place]) 10/31/2015  . Fibromyalgia   . Gastric polyp   . GERD (gastroesophageal reflux disease)   . Hiatal hernia   . High cholesterol   . History of gout   . Hyperlipidemia   . Hypertension   . Obesity   . Pneumonia    hx of    Past Surgical History:  Procedure Laterality Date   . CARDIAC CATHETERIZATION N/A 03/29/2015   Procedure: Left Heart Cath and Coronary Angiography;  Surgeon: Sherren Mocha, MD;  Location: [facility];  Service: Cardiovascular;  Laterality: N/A;  . CARDIAC CATHETERIZATION N/A 10/31/2015   Procedure: Left Heart Cath and Cors/Grafts Angiography;  Surgeon: Sherren Mocha, MD;  Location: [facility];  Service: Cardiovascular;  Laterality: N/A;  . CARDIAC CATHETERIZATION N/A 10/31/2015   Procedure: Coronary Stent Intervention;  Surgeon: Sherren Mocha, MD;  Location: [facility];  Service: Cardiovascular;  Laterality: N/A;  . COLONOSCOPY W/ POLYPECTOMY    . CORONARY ANGIOPLASTY WITH STENT PLACEMENT  09/2004-10/2009   DES to distal RCA in 09/2004; DES to Ramus Intermediate 10/2009 following abnormal stress test  . CORONARY ARTERY BYPASS GRAFT N/A 04/06/2015   Procedure: CORONARY ARTERY BYPASS GRAFTING (CABG) x three,  using bilateral mammaries, and right leg greater saphenous vein harvested endoscopically;  Surgeon: Gaye Pollack, MD;  Location: [facility];  Service: Open Heart Surgery;  Laterality: N/A;  . ESOPHAGOGASTRODUODENOSCOPY (EGD) WITH ESOPHAGEAL DILATION    . SHOULDER ARTHROSCOPY Right 2015   scar tissue  . TEE WITHOUT CARDIOVERSION N/A 04/06/2015   Procedure: TRANSESOPHAGEAL ECHOCARDIOGRAM (TEE);  Surgeon: Gaye Pollack, MD;  Location: [facility];  Service: Open Heart Surgery;  Laterality: N/A;  . ULTRASOUND GUIDANCE FOR VASCULAR ACCESS  03/29/2015   Procedure: Ultrasound Guidance For Vascular Access;  Surgeon: Sherren Mocha, MD;  Location: [facility];  Service: Cardiovascular;;    Social History Leonard  KELLIS Edwards  reports that he has never smoked. He has never used smokeless tobacco. He reports that he drinks about 3.0 oz of alcohol per week . He reports that he does not use drugs.  family history includes COPD in his mother; Diabetes type II in his sister; Heart attack in his father; Lupus in his sister.  No Known  Allergies     PHYSICAL EXAMINATION: Vital signs: BP 140/84   Pulse 64   Ht 5\' 10"  (1.778 m)   Wt 258 lb (117 kg)   BMI 37.02 kg/m   Constitutional:Pleasant, generally well-appearing, no acute distress Psychiatric: alert and oriented x3, cooperative Eyes: extraocular movements intact, anicteric, conjunctiva pink Mouth: oral pharynx moist, no lesions Neck: supple no lymphadenopathy Cardiovascular: heart regular rate and rhythm, no murmur Lungs: clear to auscultation bilaterally Abdomen: soft, obese, moderate tenderness in the lower abdomen favoring her left lower quadrant, nondistended, no obvious ascites, no peritoneal signs, normal bowel sounds, no organomegaly. Umbilical hernia Rectal: Omitted Extremities: no clubbing, cyanosis, or lower extremity edema bilaterally Skin: no lesions on visible extremities Neuro: No focal deficits. Cranial nerves intact  ASSESSMENT:  #1. Intermittent progressive lower abdominal pain associated with nausea. Rule out acute diverticulitis. Other considerations include atypical peptic ulcer disease, gallbladder disease, or nephrolithiasis #2. Multiple medical problems #3. History of adenomatous colon polyps #4. GERD  PLAN:  #1. CBC, comprehensive metabolic panel, urinalysis, lipase today #2. Continue pantoprazole 40 mg daily. Prescribed #3. Prescribed ciprofloxacin 500 mg by mouth twice a day 10 days #4. Stat contrast-enhanced CT scan of the abdomen and pelvis to rule out acute diverticulitis or other significant process to account for his severe lower abdominal pain #5. Further recommendations after the above

## 2016-11-11 NOTE — Patient Instructions (Signed)
We have sent the following medications to your pharmacy for you to pick up at your convenience: Protonix  Your physician has requested that you go to the basement for the following lab work before leaving today:  CBC, CMET Lipase, UA  You have been scheduled for a CT scan of the abdomen and pelvis at [facility] ([address]---this is in the same building as [facility]).   You are scheduled on 11/11/2016 at 3:30pm. You should arrive 15 minutes prior to your appointment time for registration. Please follow the written instructions below on the day of your exam:  WARNING: IF YOU ARE ALLERGIC TO IODINE/X-RAY DYE, PLEASE NOTIFY RADIOLOGY IMMEDIATELY AT [phone]! YOU WILL BE GIVEN A 13 HOUR PREMEDICATION PREP.  1) Do not eat or drink anything after 11:30pm (4 hours prior to your test) 2) You have been given 2 bottles of oral contrast to drink. The solution may taste better if refrigerated, but do NOT add ice or any other liquid to this solution. Shake well before drinking.    Drink 1 bottle of contrast @ 1:28m(2 hours prior to your exam)  Drink 1 bottle of contrast @ 2:30pm (1 hour prior to your exam)  You may take any medications as prescribed with a small amount of water except for the following: Metformin, Glucophage, Glucovance, Avandamet, Riomet, Fortamet, Actoplus Met, Janumet, Glumetza or Metaglip. The above medications must be held the day of the exam AND 48 hours after the exam.  The purpose of you drinking the oral contrast is to aid in the visualization of your intestinal tract. The contrast solution may cause some diarrhea. Before your exam is started, you will be given a small amount of fluid to drink. Depending on your individual set of symptoms, you may also receive an intravenous injection of x-ray contrast/dye. [facility] depending on the type of exam you are having performed.  If you have any questions  regarding your exam or if you need to reschedule, you may call the CT department at [phone] the hours of 8:00 am and 5:00 pm, Monday-Friday.  ________________________________________________________________________

## 2016-11-21 ENCOUNTER — Other Ambulatory Visit: Payer: Self-pay

## 2016-11-21 MED ORDER — CLOPIDOGREL BISULFATE 75 MG PO TABS: 75.0000 mg | ORAL_TABLET | Freq: Every day | ORAL | 0 refills | Status: DC

## 2016-12-06 ENCOUNTER — Other Ambulatory Visit: Payer: Self-pay | Admitting: Cardiovascular Disease

## 2016-12-23 ENCOUNTER — Other Ambulatory Visit: Payer: Self-pay | Admitting: Cardiovascular Disease

## 2016-12-24 DIAGNOSIS — M5441 Lumbago with sciatica, right side: Secondary | ICD-10-CM | POA: Diagnosis not present

## 2016-12-24 DIAGNOSIS — M4310 Spondylolisthesis, site unspecified: Secondary | ICD-10-CM | POA: Diagnosis not present

## 2016-12-24 DIAGNOSIS — M5442 Lumbago with sciatica, left side: Secondary | ICD-10-CM | POA: Diagnosis not present

## 2016-12-24 DIAGNOSIS — M5136 Other intervertebral disc degeneration, lumbar region: Secondary | ICD-10-CM | POA: Diagnosis not present

## 2016-12-30 ENCOUNTER — Telehealth: Payer: Self-pay

## 2016-12-30 NOTE — Telephone Encounter (Signed)
   [facility] Pre-operative Risk Assessment    Request for surgical clearance:  1. What type of surgery is being performed? Lumbar ESI  2. When is this surgery scheduled? 01/02/2017   3. Are there any medications that need to be held prior to surgery and how long? Plavix for 5 days   4. Practice name and name of physician performing surgery? [facility] (MD not listed)   5. What is your office phone and fax number?  1. Phone: [phone] ex [phone])   6. Anesthesia type (None, local, MAC, general) ? None

## 2016-12-30 NOTE — Telephone Encounter (Signed)
Pt has significant h/o CAD s/p CABG March 2017 followed by coronary stenting of the distal LCRx, 6 months post bypass 10/2015.   Pt request to hold Plavix x 5 days for lumbar injection. Will route to Dr. Burt Knack for approval.   Regardless, he also needs clinic f/u. He has not been seen in over a year.

## 2016-12-30 NOTE — Telephone Encounter (Signed)
Spoke with patient to discuss appointment for surgical clearance. Patient states he is out of town and cannot come in to see Dr. Burt Knack tomorrow. He says he will be back in town tomorrow night. I asked patient about Plavix because the request from [facility] was for patient to hold x 5 days. Patient states he stopped the Plavix on Saturday. I advised that he needs cardiology clearance in order to do so in the future and he states "well, they cleared me last time for an injection in April so I thought it was ok." I do not see documentation of this clearance.  I advised patient that I will forward to Dr. Burt Knack for review and that someone from our office will call him tomorrow. He requests a soon appointment with Dr. Burt Knack for follow-up. He thanked me for the call.

## 2016-12-31 NOTE — Telephone Encounter (Signed)
Left message to call back  

## 2016-12-31 NOTE — Telephone Encounter (Signed)
Ok to hold plavix for procedure as outlined. Please arrange appt next available. thanks

## 2017-01-01 NOTE — Telephone Encounter (Signed)
Informed patient it is OK to hold Plavix for procedure as outlined. Scheduled patient 12/19 for follow-up with Dr. Burt Knack. He was grateful for call and agrees with treatment plan.

## 2017-01-01 NOTE — Telephone Encounter (Signed)
Follow up ° ° ° °Patient returning call.  Please call °

## 2017-01-02 DIAGNOSIS — M5137 Other intervertebral disc degeneration, lumbosacral region: Secondary | ICD-10-CM | POA: Diagnosis not present

## 2017-01-02 DIAGNOSIS — M545 Low back pain: Secondary | ICD-10-CM | POA: Diagnosis not present

## 2017-01-02 NOTE — Telephone Encounter (Signed)
    Chart reviewed as part of pre-operative protocol coverage. Please let surgery know recommendations below.  Charlie Pitter, PA-C  01/02/2017, 8:04 AM

## 2017-01-02 NOTE — Telephone Encounter (Signed)
[name] to let them know that pt was cleared to hold his Plavix and to let them know that pt has been advised to f/u with Dr. Burt Knack.

## 2017-01-05 ENCOUNTER — Other Ambulatory Visit: Payer: Self-pay | Admitting: Cardiovascular Disease

## 2017-01-10 ENCOUNTER — Other Ambulatory Visit: Payer: Self-pay | Admitting: Cardiovascular Disease

## 2017-01-19 ENCOUNTER — Telehealth: Payer: Self-pay | Admitting: Internal Medicine

## 2017-01-19 NOTE — Telephone Encounter (Signed)
Patient called with mild gout flare that began today. Had one indomethacin which he took and is helping. Requests additional medication as he is out and out of town. Called into [facility] in [place].

## 2017-01-21 ENCOUNTER — Ambulatory Visit (INDEPENDENT_AMBULATORY_CARE_PROVIDER_SITE_OTHER): Payer: BLUE CROSS/BLUE SHIELD | Admitting: Cardiovascular Disease

## 2017-01-21 ENCOUNTER — Encounter: Payer: Self-pay | Admitting: Cardiovascular Disease

## 2017-01-21 VITALS — BP 142/84 | HR 60 | Ht 70.0 in | Wt 257.4 lb

## 2017-01-21 DIAGNOSIS — I10 Essential (primary) hypertension: Secondary | ICD-10-CM

## 2017-01-21 DIAGNOSIS — I2510 Atherosclerotic heart disease of native coronary artery without angina pectoris: Secondary | ICD-10-CM | POA: Diagnosis not present

## 2017-01-21 DIAGNOSIS — E785 Hyperlipidemia, unspecified: Secondary | ICD-10-CM | POA: Diagnosis not present

## 2017-01-21 NOTE — Progress Notes (Signed)
Cardiology Office Note Date:  01/23/2017   ID:  Edwards, Leonard May 17, 1955, MRN [id number]  PCP:  Marton Redwood, MD  Cardiologist:  Sherren Mocha, MD    Chief Complaint  Patient presents with  . Follow-up    CAD     History of Present Illness: Leonard Edwards is a 61 y.o. male who presents for follow-up of CAD. He has undergone multiple PCI procedures, and ultimately required CABG in 2017, treated with a a LIMA-LAD, free RIMA-ramus, and SVG-PDA.   He is here alone today, doing very well. Staying active and exercising without exertional symptoms. Today, he denies symptoms of palpitations, chest pain, shortness of breath, orthopnea, PND, lower extremity edema, dizziness, or syncope. 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     Past Medical History:  Diagnosis Date  . Benign prostatic hypertrophy   . CAD (coronary artery disease)    a. DES to distal RCA in 09/2004; DES to Ramus Intermediate 10/2009 following abnormal stress test b. 9/27 PCI with DES x3 to LCrx  . Chronic bronchitis ([place])    "in the spring"  . Esophageal stricture   . Exertional angina ([place]) 10/31/2015  . Fibromyalgia   . Gastric polyp   . GERD (gastroesophageal reflux disease)   . Hiatal hernia   . High cholesterol   . History of gout   . Hyperlipidemia   . Hypertension   . Obesity   . Pneumonia    hx of    Past Surgical History:  Procedure Laterality Date  . CARDIAC CATHETERIZATION N/A 03/29/2015   Procedure: Left Heart Cath and Coronary Angiography;  Surgeon: Sherren Mocha, MD;  Location: [facility];  Service: Cardiovascular;  Laterality: N/A;  . CARDIAC CATHETERIZATION N/A 10/31/2015   Procedure: Left Heart Cath and Cors/Grafts Angiography;  Surgeon: Sherren Mocha, MD;  Location: [facility];  Service: Cardiovascular;  Laterality: N/A;  . CARDIAC CATHETERIZATION N/A 10/31/2015   Procedure: Coronary Stent Intervention;  Surgeon: Sherren Mocha, MD;  Location: [facility];  Service:  Cardiovascular;  Laterality: N/A;  . COLONOSCOPY W/ POLYPECTOMY    . CORONARY ANGIOPLASTY WITH STENT PLACEMENT  09/2004-10/2009   DES to distal RCA in 09/2004; DES to Ramus Intermediate 10/2009 following abnormal stress test  . CORONARY ARTERY BYPASS GRAFT N/A 04/06/2015   Procedure: CORONARY ARTERY BYPASS GRAFTING (CABG) x three,  using bilateral mammaries, and right leg greater saphenous vein harvested endoscopically;  Surgeon: Gaye Pollack, MD;  Location: [facility];  Service: Open Heart Surgery;  Laterality: N/A;  . ESOPHAGOGASTRODUODENOSCOPY (EGD) WITH ESOPHAGEAL DILATION    . SHOULDER ARTHROSCOPY Right 2015   scar tissue  . TEE WITHOUT CARDIOVERSION N/A 04/06/2015   Procedure: TRANSESOPHAGEAL ECHOCARDIOGRAM (TEE);  Surgeon: Gaye Pollack, MD;  Location: [facility];  Service: Open Heart Surgery;  Laterality: N/A;  . ULTRASOUND GUIDANCE FOR VASCULAR ACCESS  03/29/2015   Procedure: Ultrasound Guidance For Vascular Access;  Surgeon: Sherren Mocha, MD;  Location: [facility];  Service: Cardiovascular;;    Current Outpatient Medications  Medication Sig Dispense Refill  . acetaminophen (TYLENOL) 500 MG tablet Take 1 tablet (500 mg total) by mouth every 6 (six) hours as needed for moderate pain. 30 tablet 0  . Alirocumab (PRALUENT) 75 MG/ML SOPN Inject 1 pen into the skin every 14 (fourteen) days. 2 pen 11  . allopurinol (ZYLOPRIM) 300 MG tablet Take 300 mg by mouth daily as needed (for gout flare ups).     Marland Kitchen  amLODipine (NORVASC) 10 MG tablet Take 1 tablet (10 mg total) by mouth daily. 90 tablet 3  . aspirin 81 MG chewable tablet Chew 1 tablet (81 mg total) by mouth daily.    . Cholecalciferol (VITAMIN D3) 1000 UNITS CAPS Take 1,000 Units by mouth daily.      Marland Kitchen CIALIS 20 MG tablet TAKE ONE TABLET DAILY AS NEEDED FOR     ERECTILE DYSFUNCTION 10 tablet 5  . clopidogrel (PLAVIX) 75 MG tablet TAKE 1 TABLET BY MOUTH DAILY WITH BREAKFAST 15 tablet 0  . Coenzyme Q10 (CO Q 10) 100 MG CAPS Take 100 mg by mouth  daily.      . diphenhydramine-acetaminophen (TYLENOL PM) 25-500 MG TABS tablet Take 0.5 tablets by mouth at bedtime as needed (sleep).    . fluticasone (FLONASE) 50 MCG/ACT nasal spray Place 2 sprays into both nostrils daily as needed for allergies or rhinitis.    Marland Kitchen levocetirizine (XYZAL) 5 MG tablet Take 5 mg by mouth daily as needed for allergies.    Marland Kitchen lisinopril (PRINIVIL,ZESTRIL) 10 MG tablet TAKE 1 TABLET BY MOUTH DAILY 30 tablet 0  . metoprolol succinate (TOPROL-XL) 50 MG 24 hr tablet TAKE 1 TABLET BY MOUTH TWICE DAILY 60 tablet 0  . montelukast (SINGULAIR) 10 MG tablet Take 10 mg by mouth at bedtime.    Marland Kitchen omega-3 acid ethyl esters (LOVAZA) 1 g capsule Take 1 g by mouth daily.    . pantoprazole (PROTONIX) 40 MG tablet Take 1 tablet (40 mg total) by mouth daily. 30 tablet 11  . nitroGLYCERIN (NITROSTAT) 0.4 MG SL tablet Place 1 tablet (0.4 mg total) under the tongue every 5 (five) minutes as needed for chest pain. 25 tablet 1   No current facility-administered medications for this visit.     Allergies:   Patient has no known allergies.   Social History:  The patient  reports that  has never smoked. he has never used smokeless tobacco. He reports that he drinks about 3.0 oz of alcohol per week. He reports that he does not use drugs.   Family History:  The patient's  family history includes COPD in his mother; Diabetes type II in his sister; Heart attack in his father; Lupus in his sister.    ROS:  Please see the history of present illness.   All other systems are reviewed and negative.    PHYSICAL EXAM: VS:  BP (!) 142/84   Pulse 60   Ht 5\' 10"  (1.778 m)   Wt 257 lb 6.4 oz (116.8 kg)   BMI 36.93 kg/m  , BMI Body mass index is 36.93 kg/m. GEN: Well nourished, well developed, in no acute distress  HEENT: normal  Neck: no JVD, no masses. No carotid bruits Cardiac: RRR without murmur or gallop                Respiratory:  clear to auscultation bilaterally, normal work of  breathing GI: soft, nontender, nondistended, + BS MS: no deformity or atrophy  Ext: no pretibial edema, pedal pulses 2+= bilaterally Skin: warm and dry, no rash Neuro:  Strength and sensation are intact Psych: euthymic mood, full affect  EKG:  EKG is ordered today. The ekg ordered today shows NSR, within normal limits  Recent Labs: 11/11/2016: ALT 24; BUN 11; Creatinine, Ser 0.82; Hemoglobin 16.6; Platelets 325.0; Potassium 4.4; Sodium 138   Lipid Panel     Component Value Date/Time   CHOL 121 02/27/2016 1001   TRIG 208 (H) 02/27/2016  1001   TRIG 147 02/10/2006 0856   HDL 35 (L) 02/27/2016 1001   CHOLHDL 3.5 02/27/2016 1001   CHOLHDL 3.9 12/19/2015 0906   VLDL 27 12/19/2015 0906   LDLCALC 44 02/27/2016 1001   LDLDIRECT 68.2 10/25/2010 1015      Wt Readings from Last 3 Encounters:  01/21/17 257 lb 6.4 oz (116.8 kg)  11/11/16 258 lb (117 kg)  12/19/15 252 lb 12.8 oz (114.7 kg)     Cardiac Studies Reviewed: Cardiac Cath 10-31-2015: Conclusion     Prox LAD lesion, 90 %stenosed.   1. Severe native three-vessel coronary artery disease with severe distal left main stenosis, severe ostial LAD stenosis, severe diffuse left circumflex stenosis, and severe right PDA stenosis  2. Status post CABG with continued patency of the LIMA to LAD, free RIMA to ramus intermedius, and SVG to right PDA  3. 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    ASSESSMENT AND PLAN: 1.  CAD, native vessel, without angina: encouraged him to continue to work on diet, exercise, lifestyle modification. No medication changes made today - medications reviewed as above.   2. HTN: BP controlled on current Rx.   3. Hyperlipidemia: on PCSK9. Last labs reviewed and LDL is 44 mg/dl. Will arrange  fasting labs. Lifestyle modification reviewed.   Current medicines are reviewed with the patient today.  The patient does not have concerns regarding medicines.  Labs/ tests ordered today include:   Orders Placed This Encounter  Procedures  . Lipid panel  . EKG 12-Lead    Disposition:   FU 6 months with Cecille Rubin and one year with me  Signed, Sherren Mocha, MD  01/23/2017 11:32 PM    [facility] [address], [place], [place]  [address] Phone: [phone]; Fax: [phone]

## 2017-01-21 NOTE — Patient Instructions (Addendum)
Medication Instructions:  Your provider recommends that you continue on your current medications as directed. Please refer to the Current Medication list given to you today.    Labwork: Your provider recommends that you return for FASTING lab work.    Testing/Procedures: None  Follow-Up: Your provider wants you to follow-up in: 6 months with [name]. You will receive a reminder letter in the mail two months in advance. If you don't receive a letter, please call our office to schedule the follow-up appointment.    Your provider wants you to follow-up in: 1 year with Dr. Burt Knack. You will receive a reminder letter in the mail two months in advance. If you don't receive a letter, please call our office to schedule the follow-up appointment.    Any Other Special Instructions Will Be Listed Below (If Applicable).     If you need a refill on your cardiac medications before your next appointment, please call your pharmacy.

## 2017-01-23 ENCOUNTER — Encounter: Payer: Self-pay | Admitting: Cardiovascular Disease

## 2017-01-28 ENCOUNTER — Other Ambulatory Visit: Payer: Self-pay | Admitting: Nurse Practitioner

## 2017-02-07 ENCOUNTER — Other Ambulatory Visit: Payer: Self-pay | Admitting: Cardiovascular Disease

## 2017-02-11 ENCOUNTER — Other Ambulatory Visit: Payer: BLUE CROSS/BLUE SHIELD | Admitting: *Deleted

## 2017-02-11 DIAGNOSIS — I2510 Atherosclerotic heart disease of native coronary artery without angina pectoris: Secondary | ICD-10-CM

## 2017-02-11 DIAGNOSIS — I10 Essential (primary) hypertension: Secondary | ICD-10-CM

## 2017-02-11 DIAGNOSIS — E785 Hyperlipidemia, unspecified: Secondary | ICD-10-CM | POA: Diagnosis not present

## 2017-02-11 LAB — LIPID PANEL
CHOLESTEROL TOTAL: 168 mg/dL (ref 100–199)
Chol/HDL Ratio: 4.7 ratio (ref 0.0–5.0)
HDL: 36 mg/dL — ABNORMAL LOW (ref >39)
LDL CALC: 87 mg/dL (ref 0–99)
TRIGLYCERIDES: 225 mg/dL — AB (ref 0–149)
VLDL CHOLESTEROL CAL: 45 mg/dL — AB (ref 5–40)

## 2017-02-15 ENCOUNTER — Other Ambulatory Visit: Payer: Self-pay | Admitting: Cardiovascular Disease

## 2017-02-20 ENCOUNTER — Telehealth: Payer: Self-pay | Admitting: Pharmacist

## 2017-02-20 DIAGNOSIS — E782 Mixed hyperlipidemia: Secondary | ICD-10-CM

## 2017-02-20 NOTE — Telephone Encounter (Signed)
LDL has increased since starting Praluent from 44 to 87. Called pt who states he missed an injection 3-4 weeks ago because it was sent to the wrong address. He is now back on his correct schedule. Will recheck lipids in another 6 weeks to ensure LDL remains at goal 70mg /dL. Can increase Praluent to the 150mg /mL dose if needed at that time. Pt is aware of lab results and in agreement with plan.

## 2017-03-03 ENCOUNTER — Telehealth: Payer: Self-pay

## 2017-03-03 ENCOUNTER — Other Ambulatory Visit: Payer: Self-pay

## 2017-03-03 MED ORDER — AMOXICILLIN 500 MG PO CAPS: 500.0000 mg | ORAL_CAPSULE | Freq: Two times a day (BID) | ORAL | 0 refills | Status: DC

## 2017-03-03 NOTE — Telephone Encounter (Signed)
Script sent to pharmacy.

## 2017-03-03 NOTE — Telephone Encounter (Signed)
-----   Message from Irene Shipper, MD sent at 03/03/2017  2:14 PM EST ----- Regarding: amoxicillin for sinusitis Vaughan Basta, Mr. Missildine has acute sinusitis. Please call in amoxicillin 500 mg twice daily; dispense 20; no refills. Call in to [facility] Thank you Dr. Henrene Pastor

## 2017-03-31 ENCOUNTER — Other Ambulatory Visit: Payer: BLUE CROSS/BLUE SHIELD

## 2017-04-17 IMAGING — CR DG CHEST 2V
2 series · 2 of 2 positions shown · non-contrast
Comparison: 01/14/2013.

CLINICAL DATA: Preoperative respiratory evaluation for CABG.

EXAM:
CHEST  2 VIEW

[w chest pa]
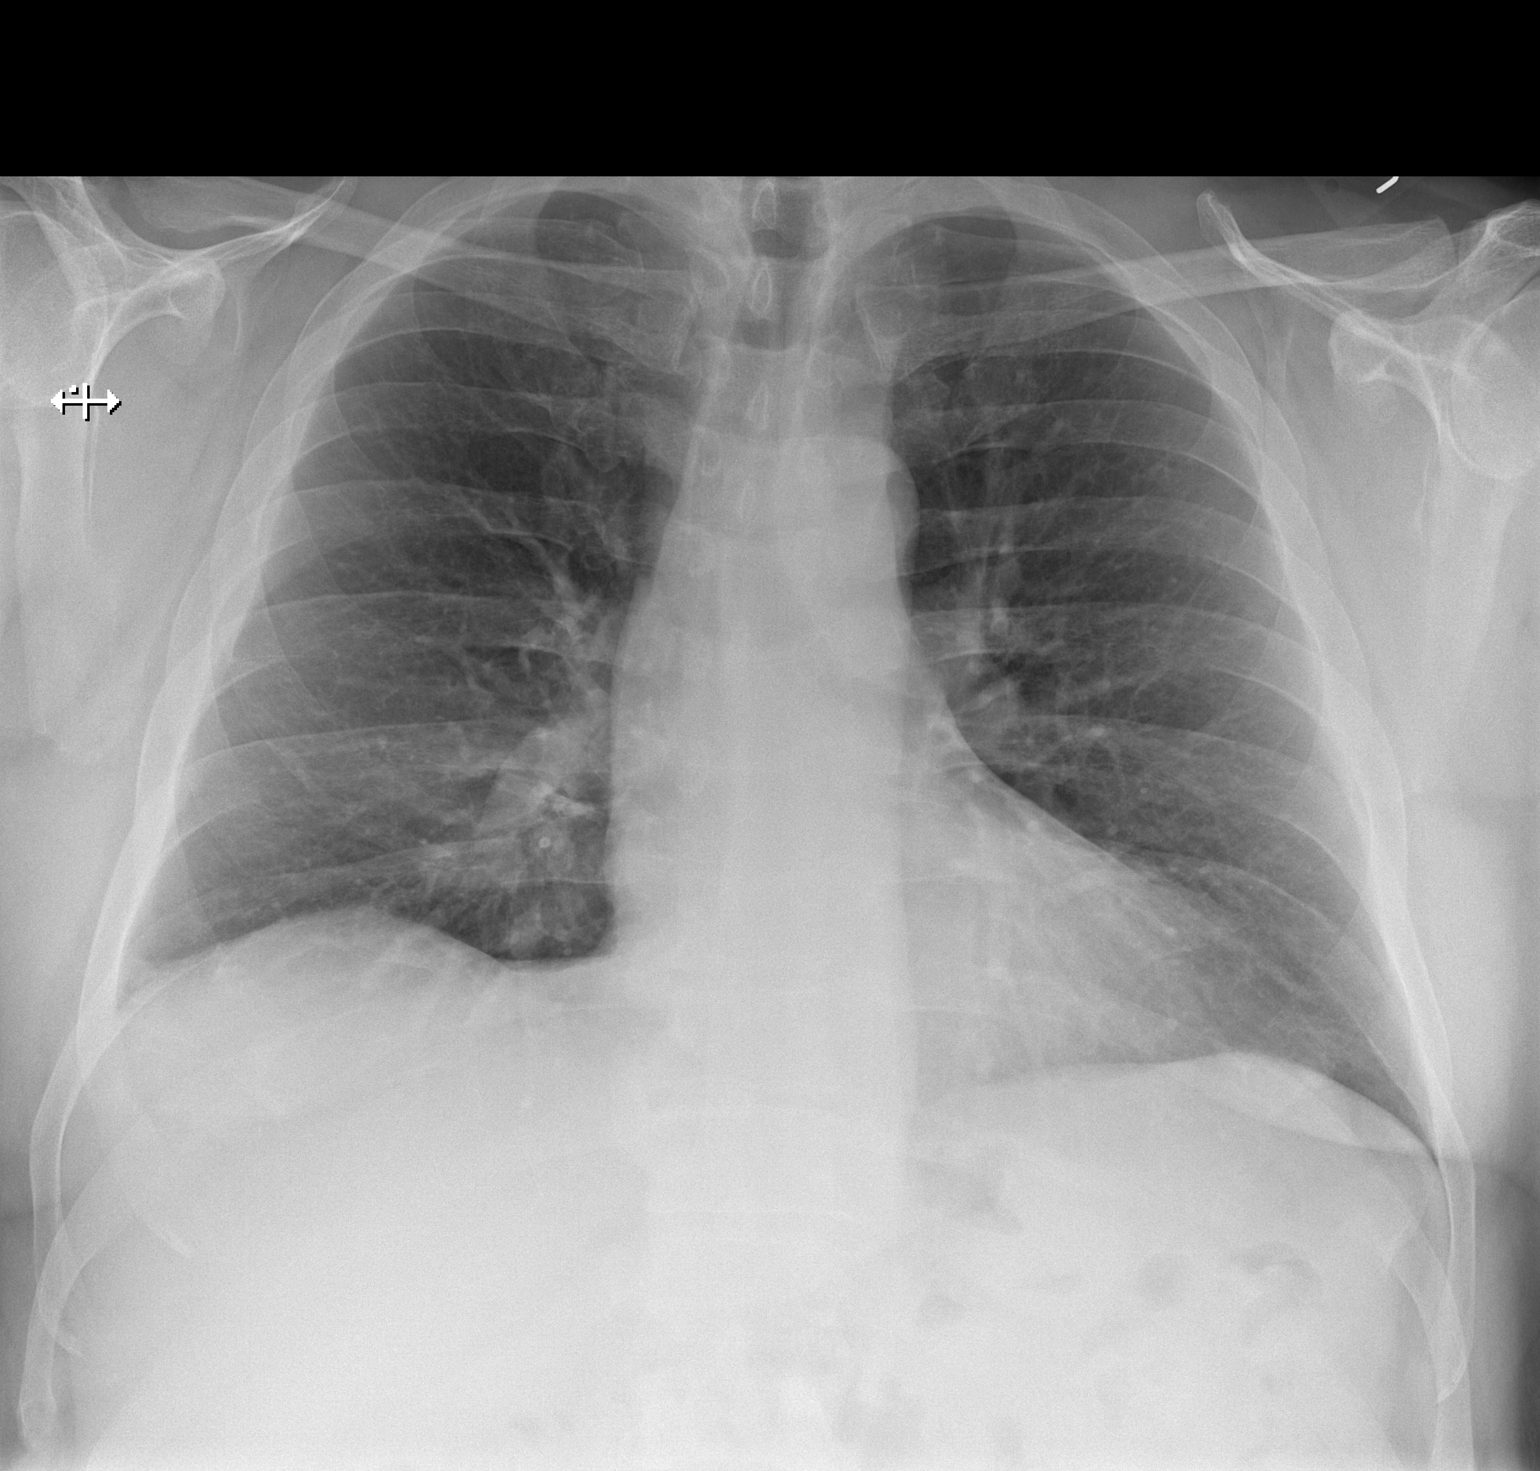

[w chest lat]
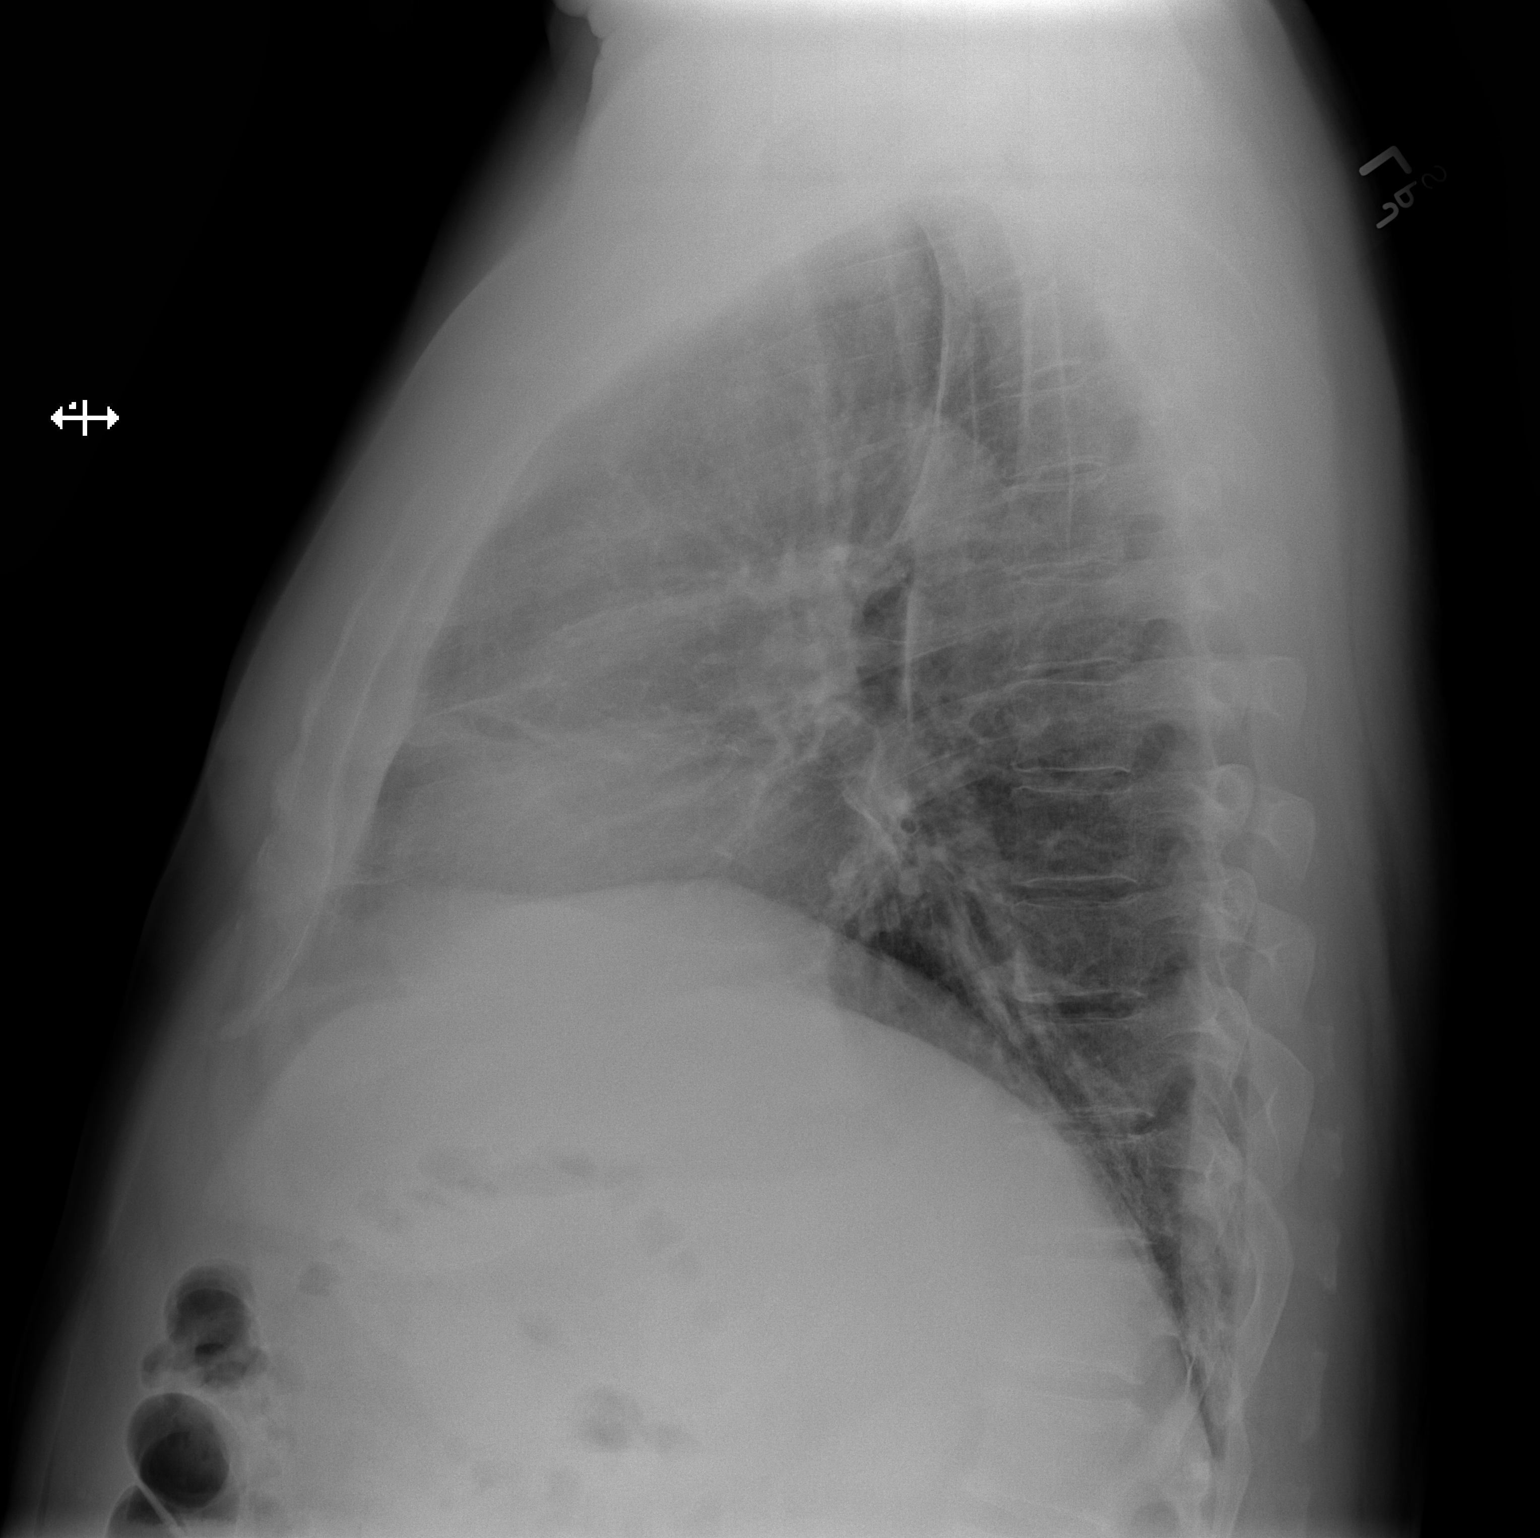

[2 of 2 positions shown; findings below may reference images not displayed]

FINDINGS: The lungs are clear wiithout focal pneumonia, edema, pneumothorax or
pleural effusion. The cardiopericardial silhouette is within normal
limits for size. The visualized bony structures of the thorax are
intact.
IMPRESSION: Normal exam.

## 2017-04-19 IMAGING — CR DG CHEST 1V PORT
1 series · 1 of 1 positions shown · non-contrast
Comparison: Portable exam 8319 hours compared to 04/04/2015

CLINICAL DATA: Coronary artery disease post CABG x 3, hypertension

EXAM:
PORTABLE CHEST 1 VIEW

[AP]
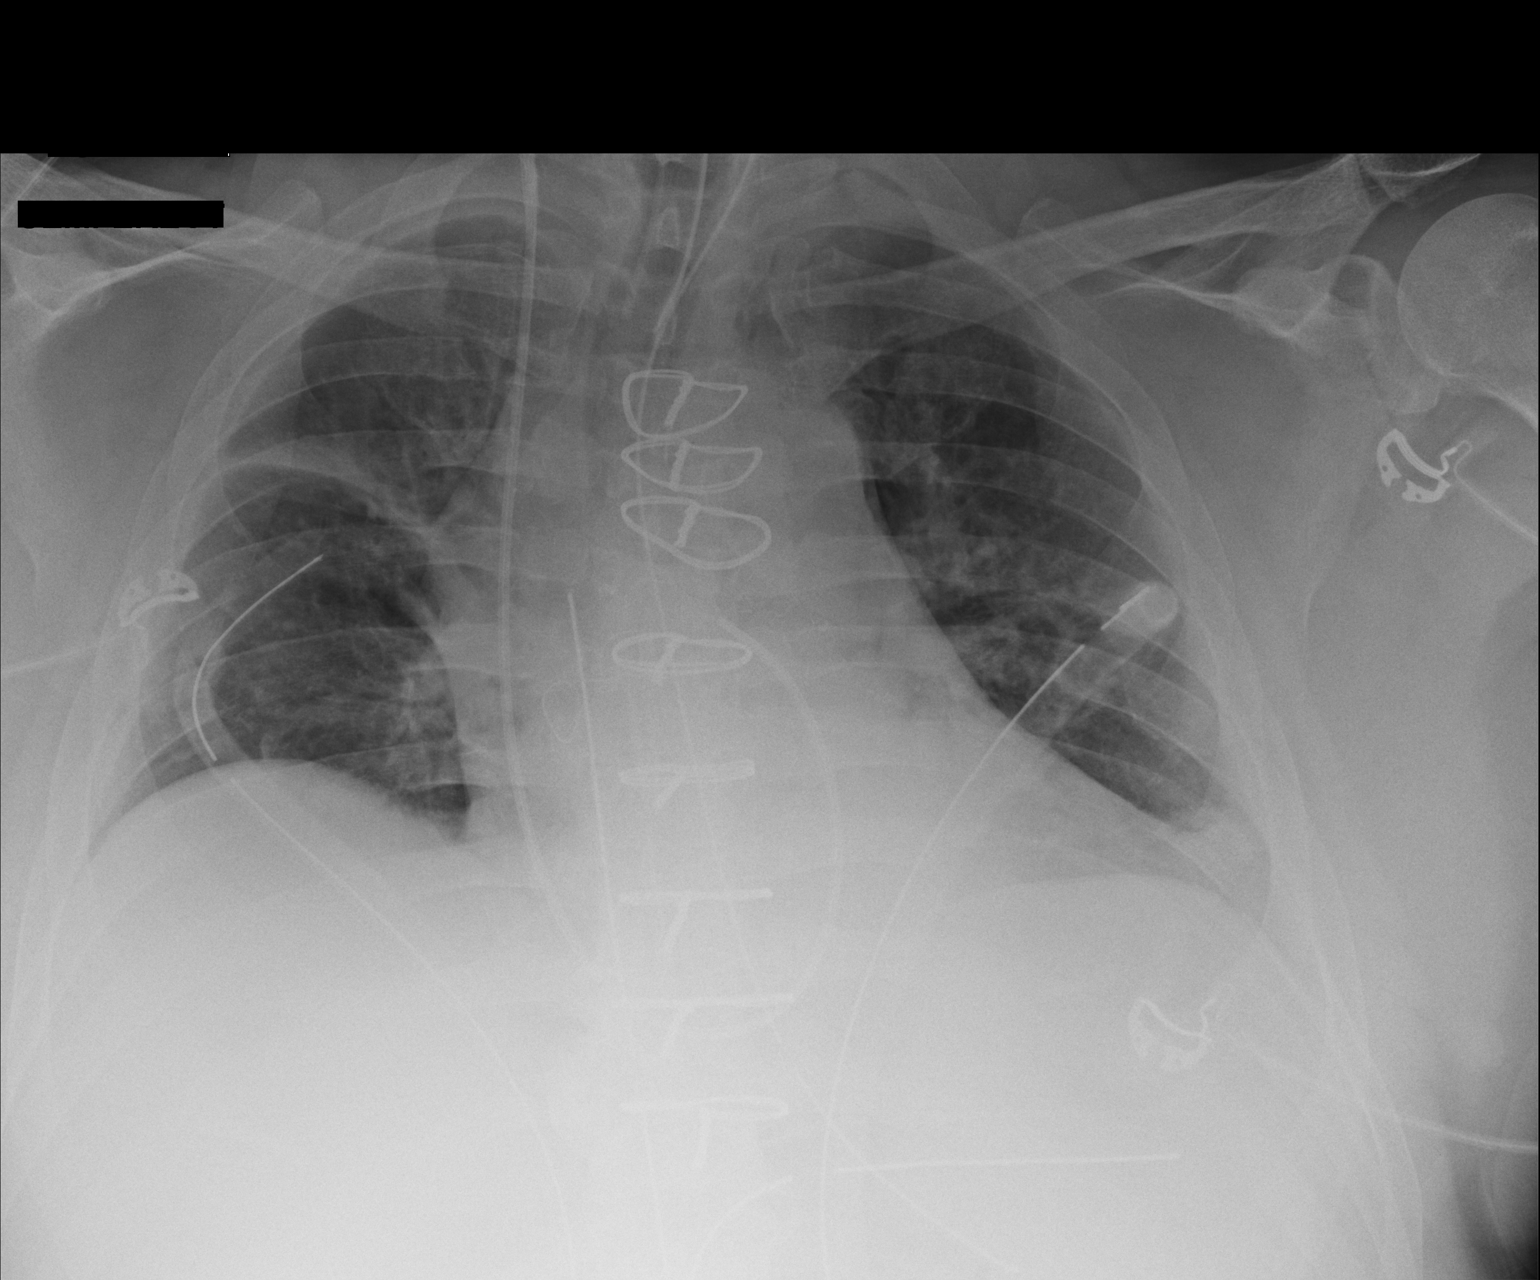

[1 of 1 positions shown; findings below may reference images not displayed]

FINDINGS: Tip of endotracheal tube projects 6.4 cm above carina.

Nasogastric tube extends into stomach.

Mediastinal drains and BILATERAL thoracostomy tubes present.

RIGHT jugular Swan-Ganz catheter tip projects over proximal RIGHT
pulmonary artery.

Enlargement of cardiac silhouette post CABG.

Low lung volumes with bibasilar atelectasis and question minimal
perihilar edema.

No pleural effusion or pneumothorax.
IMPRESSION: Postoperative changes as above.

## 2017-04-20 IMAGING — CR DG CHEST 1V PORT
1 series · 1 of 1 positions shown · non-contrast
Comparison: the previous day's study

CLINICAL DATA: Coronary bypass grafting, chest tubes

EXAM:
PORTABLE CHEST - 1 VIEW

[AP]
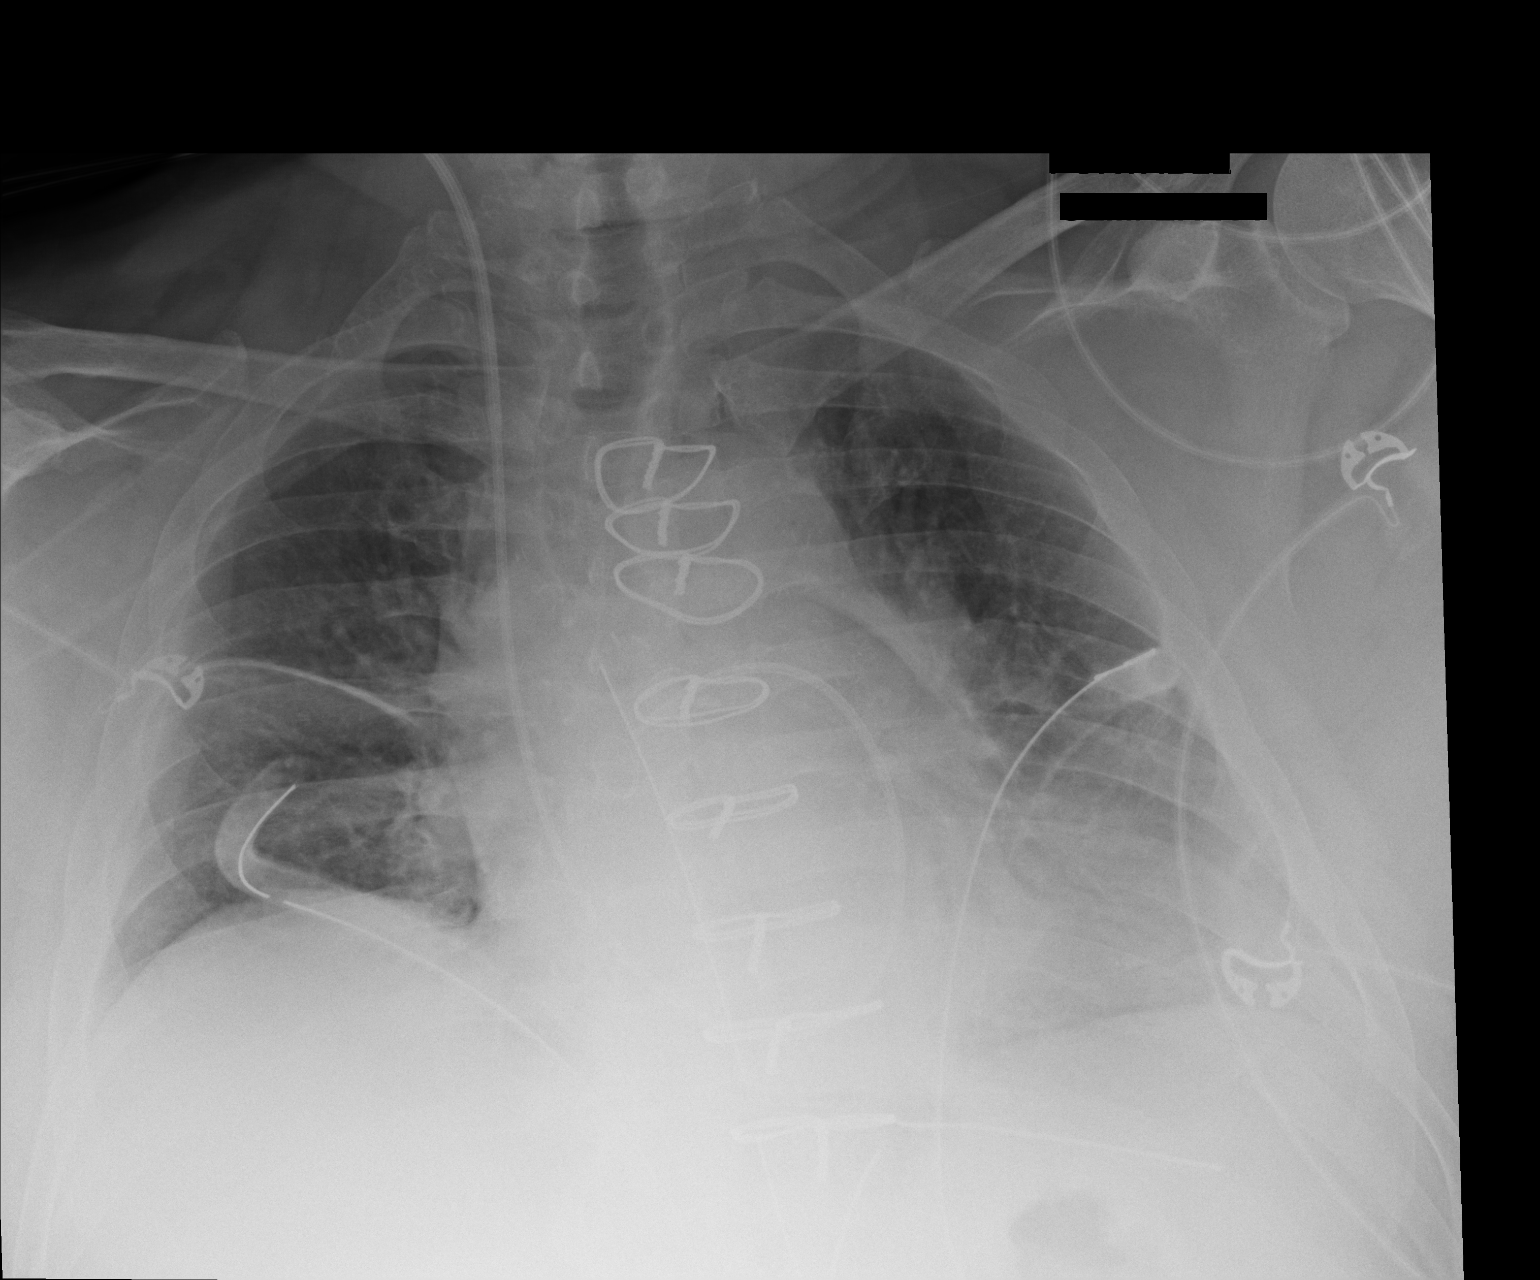

[1 of 1 positions shown; findings below may reference images not displayed]

FINDINGS: Changes of median sternotomy and CABG. The patient has been
extubated and the nasogastric tube removed. The right IJ Swan-Ganz
catheter, mediastinal drain and bilateral chest tubes are stable in
position.
No pneumothorax. Relatively low lung volumes with worsening
perihilar atelectasis or infiltrate, left greater than right. No
definite effusion.
IMPRESSION: 1. Interval extubation with worsening perihilar atelectasis or
infiltrate, left greater than right.
2. Stable bilateral chest tubes with no pneumothorax.

## 2017-04-22 ENCOUNTER — Telehealth: Payer: Self-pay | Admitting: Pharmacist

## 2017-04-22 DIAGNOSIS — E782 Mixed hyperlipidemia: Secondary | ICD-10-CM | POA: Diagnosis not present

## 2017-04-22 DIAGNOSIS — I10 Essential (primary) hypertension: Secondary | ICD-10-CM | POA: Diagnosis not present

## 2017-04-22 DIAGNOSIS — M1000 Idiopathic gout, unspecified site: Secondary | ICD-10-CM | POA: Diagnosis not present

## 2017-04-22 DIAGNOSIS — R7301 Impaired fasting glucose: Secondary | ICD-10-CM | POA: Diagnosis not present

## 2017-04-22 DIAGNOSIS — Z125 Encounter for screening for malignant neoplasm of prostate: Secondary | ICD-10-CM | POA: Diagnosis not present

## 2017-04-22 NOTE — Telephone Encounter (Signed)
Have LMOM for pt x3 regarding missed labwork to check lipid panel. Will await patient's return call.

## 2017-04-28 DIAGNOSIS — E8801 Alpha-1-antitrypsin deficiency: Secondary | ICD-10-CM | POA: Diagnosis not present

## 2017-04-28 DIAGNOSIS — E782 Mixed hyperlipidemia: Secondary | ICD-10-CM | POA: Diagnosis not present

## 2017-04-28 DIAGNOSIS — Z1389 Encounter for screening for other disorder: Secondary | ICD-10-CM | POA: Diagnosis not present

## 2017-04-28 DIAGNOSIS — E291 Testicular hypofunction: Secondary | ICD-10-CM | POA: Diagnosis not present

## 2017-04-28 DIAGNOSIS — R7301 Impaired fasting glucose: Secondary | ICD-10-CM | POA: Diagnosis not present

## 2017-04-28 DIAGNOSIS — I10 Essential (primary) hypertension: Secondary | ICD-10-CM | POA: Diagnosis not present

## 2017-04-28 DIAGNOSIS — Z0000 Encounter for general adult medical examination without abnormal findings: Secondary | ICD-10-CM | POA: Diagnosis not present

## 2017-05-14 ENCOUNTER — Other Ambulatory Visit: Payer: Self-pay

## 2017-05-14 ENCOUNTER — Telehealth: Payer: Self-pay

## 2017-05-14 MED ORDER — AMOXICILLIN 500 MG PO CAPS: 500.0000 mg | ORAL_CAPSULE | Freq: Two times a day (BID) | ORAL | 0 refills | Status: DC

## 2017-05-14 NOTE — Telephone Encounter (Signed)
-----   Message from Irene Shipper, MD sent at 05/14/2017  3:19 PM EDT ----- Regarding: Call in medication Vaughan Basta, Mr. Prime has acute sinusitis. Please call in Amoxicillin 500 mg bid x 10 days to [facility] at [facility], [place]. Thanks

## 2017-05-14 NOTE — Telephone Encounter (Signed)
Script sent to pharmacy.

## 2017-05-25 DIAGNOSIS — I10 Essential (primary) hypertension: Secondary | ICD-10-CM | POA: Diagnosis not present

## 2017-05-25 DIAGNOSIS — R079 Chest pain, unspecified: Secondary | ICD-10-CM | POA: Diagnosis not present

## 2017-05-25 DIAGNOSIS — E785 Hyperlipidemia, unspecified: Secondary | ICD-10-CM | POA: Diagnosis not present

## 2017-05-25 DIAGNOSIS — K8020 Calculus of gallbladder without cholecystitis without obstruction: Secondary | ICD-10-CM | POA: Diagnosis not present

## 2017-05-25 DIAGNOSIS — K5710 Diverticulosis of small intestine without perforation or abscess without bleeding: Secondary | ICD-10-CM | POA: Diagnosis not present

## 2017-05-25 DIAGNOSIS — Z951 Presence of aortocoronary bypass graft: Secondary | ICD-10-CM | POA: Diagnosis not present

## 2017-05-25 DIAGNOSIS — R1013 Epigastric pain: Secondary | ICD-10-CM | POA: Diagnosis not present

## 2017-05-25 DIAGNOSIS — K5730 Diverticulosis of large intestine without perforation or abscess without bleeding: Secondary | ICD-10-CM | POA: Diagnosis not present

## 2017-05-25 DIAGNOSIS — K5790 Diverticulosis of intestine, part unspecified, without perforation or abscess without bleeding: Secondary | ICD-10-CM | POA: Diagnosis not present

## 2017-05-27 ENCOUNTER — Ambulatory Visit (INDEPENDENT_AMBULATORY_CARE_PROVIDER_SITE_OTHER): Payer: BLUE CROSS/BLUE SHIELD | Admitting: Internal Medicine

## 2017-05-27 ENCOUNTER — Ambulatory Visit: Payer: BLUE CROSS/BLUE SHIELD | Admitting: Internal Medicine

## 2017-05-27 ENCOUNTER — Encounter: Payer: Self-pay | Admitting: Internal Medicine

## 2017-05-27 VITALS — BP 118/66 | HR 70 | Ht 70.0 in | Wt 251.0 lb

## 2017-05-27 DIAGNOSIS — R1011 Right upper quadrant pain: Secondary | ICD-10-CM | POA: Diagnosis not present

## 2017-05-27 DIAGNOSIS — H5203 Hypermetropia, bilateral: Secondary | ICD-10-CM | POA: Diagnosis not present

## 2017-05-27 DIAGNOSIS — K8020 Calculus of gallbladder without cholecystitis without obstruction: Secondary | ICD-10-CM | POA: Diagnosis not present

## 2017-05-27 NOTE — Progress Notes (Signed)
HISTORY OF PRESENT ILLNESS:  Leonard Edwards is a 62 y.o. 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 He was last seen in this office 11/11/2016 for evaluation of intermittent progressive lower abdominal pain associated with nausea. He was felt likely to have diverticulitis for which ciprofloxacin was prescribed. CT scan of the abdomen and pelvis confirmed acute left-sided diverticulitis and incidental gallstones. He improved. He contacted me 2 days ago complaining of severe abdominal pain which began in the middle the night. He thought that it may represent a flare of diverticulitis. He was away in [place]. I prescribed ciprofloxacin and advised that if pain persisted or worsened that he present to the emergency room. The pain intensified in the epigastric region with radiation to his right side and right back. He underwent extensive evaluation. Given his cardiac history negative EKG, troponin, and chest x-ray. Laboratories including liver function tests and lipase were all entirely normal except for a white blood cell count of 16.2. CT scan of the abdomen and pelvis revealed diverticulosis without diverticulitis. He was noted to have gallstones though no acute cholecystitis. The clinical diagnosis was symptomatic gallstones. He was prescribed Vicodin and Zofran and told to follow-up with a general surgeon. I told him to continue on ciprofloxacin. Initially he improved. However he has had recurrent problems with pain to varying degrees and fever. I asked him to come into the office this afternoon. He is hoping to travel to [place] next week for the [employer].  REVIEW OF SYSTEMS:  All non-GI ROS negative except for sinus trouble and fevers  Past Medical History:  Diagnosis Date  . Benign prostatic hypertrophy   . CAD (coronary artery disease)    a. DES to distal RCA in 09/2004; DES to Ramus Intermediate 10/2009 following  abnormal stress test b. 9/27 PCI with DES x3 to LCrx  . Chronic bronchitis ([facility])    "in the spring"  . Esophageal stricture   . Exertional angina ([place]) 10/31/2015  . Fibromyalgia   . Gastric polyp   . GERD (gastroesophageal reflux disease)   . Hiatal hernia   . High cholesterol   . History of gout   . Hyperlipidemia   . Hypertension   . Obesity   . Pneumonia    hx of    Past Surgical History:  Procedure Laterality Date  . CARDIAC CATHETERIZATION N/A 03/29/2015   Procedure: Left Heart Cath and Coronary Angiography;  Surgeon: Sherren Mocha, MD;  Location: [facility];  Service: Cardiovascular;  Laterality: N/A;  . CARDIAC CATHETERIZATION N/A 10/31/2015   Procedure: Left Heart Cath and Cors/Grafts Angiography;  Surgeon: Sherren Mocha, MD;  Location: [facility];  Service: Cardiovascular;  Laterality: N/A;  . CARDIAC CATHETERIZATION N/A 10/31/2015   Procedure: Coronary Stent Intervention;  Surgeon: Sherren Mocha, MD;  Location: [facility];  Service: Cardiovascular;  Laterality: N/A;  . COLONOSCOPY W/ POLYPECTOMY    . CORONARY ANGIOPLASTY WITH STENT PLACEMENT  09/2004-10/2009   DES to distal RCA in 09/2004; DES to Ramus Intermediate 10/2009 following abnormal stress test  . CORONARY ARTERY BYPASS GRAFT N/A 04/06/2015   Procedure: CORONARY ARTERY BYPASS GRAFTING (CABG) x three,  using bilateral mammaries, and right leg greater saphenous vein harvested endoscopically;  Surgeon: Gaye Pollack, MD;  Location: [facility];  Service: Open Heart Surgery;  Laterality: N/A;  . ESOPHAGOGASTRODUODENOSCOPY (EGD) WITH ESOPHAGEAL DILATION    . SHOULDER ARTHROSCOPY  Right 2015   scar tissue  . TEE WITHOUT CARDIOVERSION N/A 04/06/2015   Procedure: TRANSESOPHAGEAL ECHOCARDIOGRAM (TEE);  Surgeon: Gaye Pollack, MD;  Location: [facility];  Service: Open Heart Surgery;  Laterality: N/A;  . ULTRASOUND GUIDANCE FOR VASCULAR ACCESS  03/29/2015   Procedure: Ultrasound Guidance For Vascular Access;   Surgeon: Sherren Mocha, MD;  Location: [facility];  Service: Cardiovascular;;    Social History Donn CIAN COSTANZO  reports that he has never smoked. He has never used smokeless tobacco. He reports that he drinks about 3.0 oz of alcohol per week. He reports that he does not use drugs.  family history includes COPD in his mother; Diabetes type II in his sister; Heart attack in his father; Lupus in his sister.  No Known Allergies     PHYSICAL EXAMINATION: Vital signs: BP 118/66   Pulse 70   Ht 5\' 10"  (1.778 m)   Wt 251 lb (113.9 kg)   BMI 36.01 kg/m   Constitutional: generally well-appearing, no acute distress Psychiatric: alert and oriented x3, cooperative Eyes: extraocular movements intact, anicteric, conjunctiva pink Mouth: oral pharynx moist, no lesions Neck: supple no lymphadenopathy Cardiovascular: heart regular rate and rhythm, no murmur. Median sternotomy scar well-healed Lungs: clear to auscultation bilaterally Abdomen: soft, moderate right upper quadrant tenderness, nondistended, no obvious ascites, no peritoneal signs, normal bowel sounds, no organomegaly Rectal:omitted Extremities: no clubbing or cyanosis. Trace lower extremity edema bilaterally Skin: no lesions on visible extremities Neuro: No focal deficits. Cranial nerves intact  ASSESSMENT:  #1. Symptomatic cholelithiasis. Rule out cholecystitis #2. History of diverticulitis. No evidence of such currently #3. GERD. Asymptomatic on pantoprazole #4. Colonoscopy 2009 negative. Colonoscopy 2015 with diverticulosis and small polyp. Follow-up in 5 years   PLAN:  #1. Right upper quadrant ultrasound to be followed by HIDA scan. Scheduled for 05-29-17 AM #2. LFTs and CBC today #3. Continue ciprofloxacin #4. Low-fat diet #5. General surgical referral urgency and timing to be determined

## 2017-05-27 NOTE — Patient Instructions (Signed)
Your provider has requested that you go to the basement level for lab work before leaving today. Press "B" on the elevator. The lab is located at the first door on the left as you exit the elevator.    You have been scheduled for an abdominal ultrasound at [facility] (1st floor of hospital) on 05/29/2017 at 9:00am. Please arrive 30 minutes prior to your appointment for registration. Make certain not to have anything to eat or drink after midnight prior to your appointment. Should you need to reschedule your appointment, please contact radiology at [phone]. This test typically takes about 30 minutes to perform.  You have been scheduled for a HIDA scan at [facility] (1st floor) on 05/29/2017.    hepatobiliary (HIDA) scan is an imaging procedure used to diagnose problems in the liver, gallbladder and bile ducts. In the HIDA scan, a radioactive chemical or tracer is injected into a vein in your arm. The tracer is handled by the liver like bile. Bile is a fluid produced and excreted by your liver that helps your digestive system break down fats in the foods you eat. Bile is stored in your gallbladder and the gallbladder releases the bile when you eat a meal. A special nuclear medicine scanner (gamma camera) tracks the flow of the tracer from your liver into your gallbladder and small intestine.  During your HIDA scan  You'll be asked to change into a hospital gown before your HIDA scan begins. Your health care team will position you on a table, usually on your back. The radioactive tracer is then injected into a vein in your arm.The tracer travels through your bloodstream to your liver, where it's taken up by the bile-producing cells. The radioactive tracer travels with the bile from your liver into your gallbladder and through your bile ducts to your small intestine.You may feel some pressure while the radioactive tracer is injected into your vein. As you lie on the table, a special  gamma camera is positioned over your abdomen taking pictures of the tracer as it moves through your body. The gamma camera takes pictures continually for about an hour. You'll need to keep still during the HIDA scan. This can become uncomfortable, but you may find that you can lessen the discomfort by taking deep breaths and thinking about other things. Tell your health care team if you're uncomfortable. The radiologist will watch on a computer the progress of the radioactive tracer through your body. The HIDA scan may be stopped when the radioactive tracer is seen in the gallbladder and enters your small intestine. This typically takes about an hour. In some cases extra imaging will be performed if original images aren't satisfactory, if morphine is given to help visualize the gallbladder or if the medication CCK is given to look at the contraction of the gallbladder. This test typically takes 2 hours to complete. ________________________________________________________________________

## 2017-05-28 ENCOUNTER — Other Ambulatory Visit (INDEPENDENT_AMBULATORY_CARE_PROVIDER_SITE_OTHER): Payer: BLUE CROSS/BLUE SHIELD

## 2017-05-28 DIAGNOSIS — K8020 Calculus of gallbladder without cholecystitis without obstruction: Secondary | ICD-10-CM | POA: Diagnosis not present

## 2017-05-28 DIAGNOSIS — R1011 Right upper quadrant pain: Secondary | ICD-10-CM

## 2017-05-28 LAB — CBC WITH DIFFERENTIAL/PLATELET
Basophils Absolute: 0.1 10*3/uL (ref 0.0–0.1)
Basophils Relative: 0.5 % (ref 0.0–3.0)
Eosinophils Absolute: 0.3 10*3/uL (ref 0.0–0.7)
Eosinophils Relative: 2.4 % (ref 0.0–5.0)
HCT: 45.7 % (ref 39.0–52.0)
HEMOGLOBIN: 16.2 g/dL (ref 13.0–17.0)
LYMPHS ABS: 1.8 10*3/uL (ref 0.7–4.0)
Lymphocytes Relative: 15.4 % (ref 12.0–46.0)
MCHC: 35.4 g/dL (ref 30.0–36.0)
MCV: 89 fl (ref 78.0–100.0)
Monocytes Absolute: 2 10*3/uL — ABNORMAL HIGH (ref 0.1–1.0)
Monocytes Relative: 16.9 % — ABNORMAL HIGH (ref 3.0–12.0)
NEUTROS PCT: 64.8 % (ref 43.0–77.0)
Neutro Abs: 7.8 10*3/uL — ABNORMAL HIGH (ref 1.4–7.7)
Platelets: 318 10*3/uL (ref 150.0–400.0)
RBC: 5.13 Mil/uL (ref 4.22–5.81)
RDW: 12.8 % (ref 11.5–15.5)
WBC: 12 10*3/uL — AB (ref 4.0–10.5)

## 2017-05-28 LAB — HEPATIC FUNCTION PANEL
ALK PHOS: 71 U/L (ref 39–117)
ALT: 13 U/L (ref 0–53)
AST: 15 U/L (ref 0–37)
Albumin: 4 g/dL (ref 3.5–5.2)
Bilirubin, Direct: 0.3 mg/dL (ref 0.0–0.3)
Total Bilirubin: 1.4 mg/dL — ABNORMAL HIGH (ref 0.2–1.2)
Total Protein: 7.7 g/dL (ref 6.0–8.3)

## 2017-05-29 ENCOUNTER — Encounter (HOSPITAL_COMMUNITY)
Admission: RE | Admit: 2017-05-29 | Discharge: 2017-05-29 | Disposition: A | Discharge status: Home / Self Care | Payer: BLUE CROSS/BLUE SHIELD | Source: Ambulatory Visit | Attending: Internal Medicine | Admitting: Internal Medicine

## 2017-05-29 ENCOUNTER — Ambulatory Visit (HOSPITAL_COMMUNITY)
Admission: RE | Admit: 2017-05-29 | Discharge: 2017-05-29 | Disposition: A | Discharge status: Home / Self Care | Payer: BLUE CROSS/BLUE SHIELD | Source: Ambulatory Visit | Attending: Internal Medicine | Admitting: Internal Medicine

## 2017-05-29 ENCOUNTER — Telehealth: Payer: Self-pay

## 2017-05-29 ENCOUNTER — Encounter (HOSPITAL_COMMUNITY): Payer: Self-pay

## 2017-05-29 DIAGNOSIS — K829 Disease of gallbladder, unspecified: Secondary | ICD-10-CM | POA: Insufficient documentation

## 2017-05-29 DIAGNOSIS — R1011 Right upper quadrant pain: Secondary | ICD-10-CM

## 2017-05-29 DIAGNOSIS — K8020 Calculus of gallbladder without cholecystitis without obstruction: Secondary | ICD-10-CM | POA: Insufficient documentation

## 2017-05-29 IMAGING — CT CT ANGIO ABDOMEN
2 of 9 series · 15 of 46 positions shown, 17 images · IV contrast (isovue)
Comparison: None.

CLINICAL DATA: Sudden onset abdominal pain and right flank pain.

EXAM:
CT ANGIOGRAPHY ABDOMEN
TECHNIQUE: Multidetector CT imaging of the abdomen was performed using the
standard protocol during bolus administration of intravenous
contrast. Multiplanar reconstructed images including MIPs were
obtained and reviewed to evaluate the vascular anatomy.
CONTRAST:  100 mL Isovue 370 intravenous

[Series 6: dissection 1.0 i30f 3 · axial · 0.98mm/px · z∈[+735,+1264]mm · 12 of 585 slices shown, 14 images]
[im 28/585  soft-tissue]
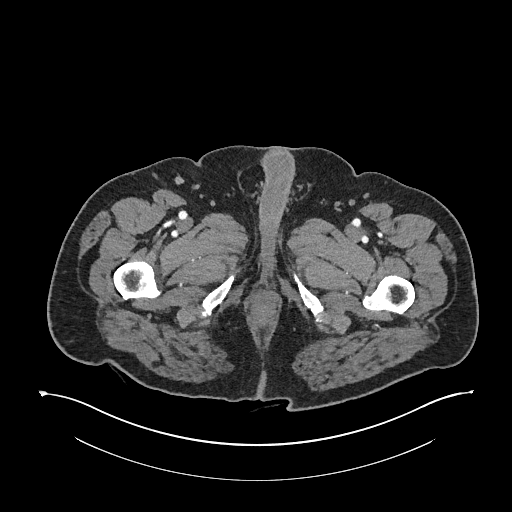
[im 28/585  bone]
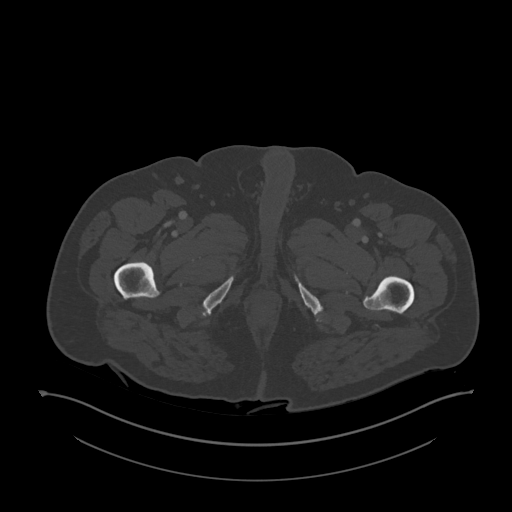
[im 84/585  soft-tissue]
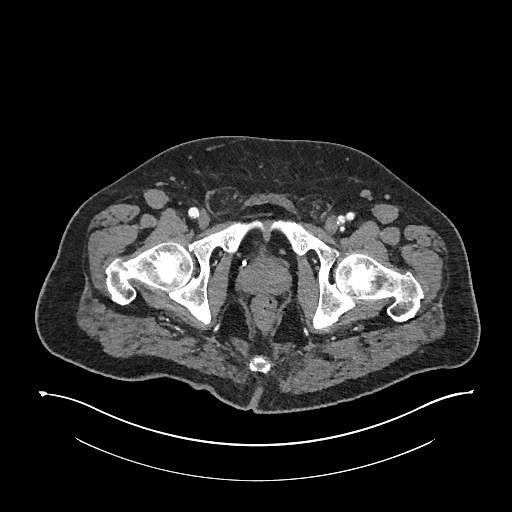
[im 140/585  soft-tissue]
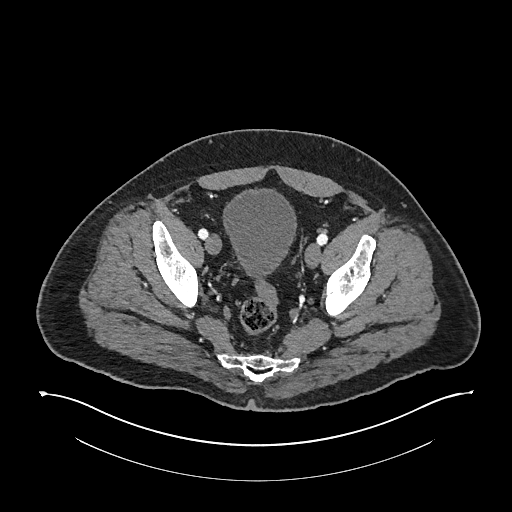
[im 167/585  soft-tissue]
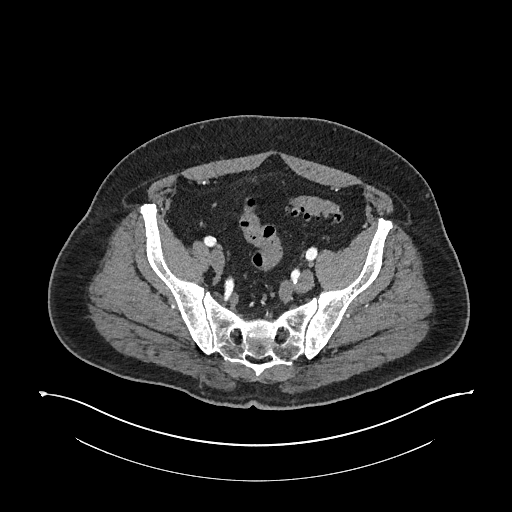
[im 223/585  soft-tissue]
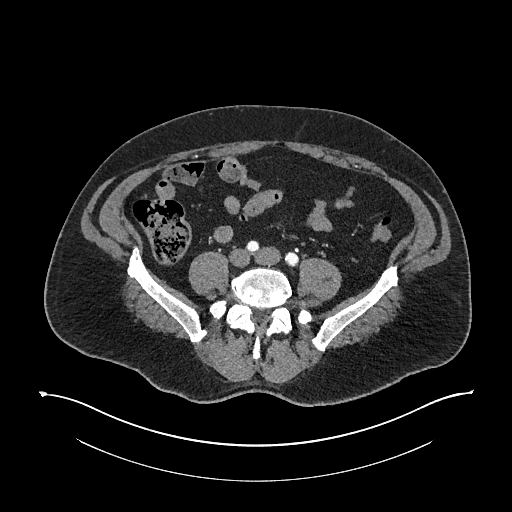
[im 279/585  soft-tissue]
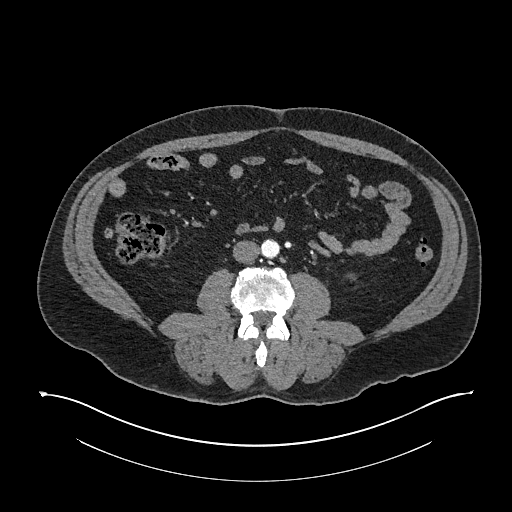
[im 306/585  soft-tissue]
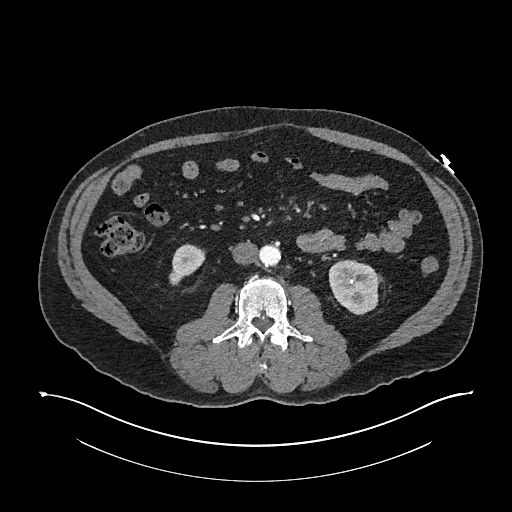
[im 362/585  soft-tissue]
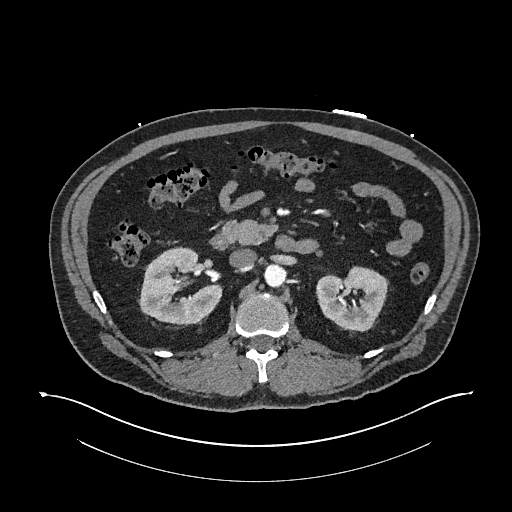
[im 418/585  soft-tissue]
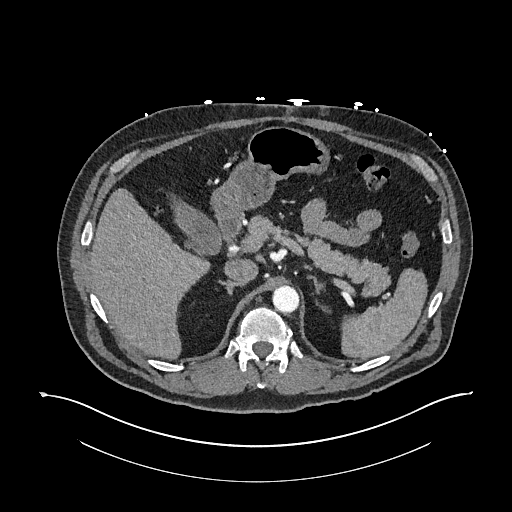
[im 418/585  bone]
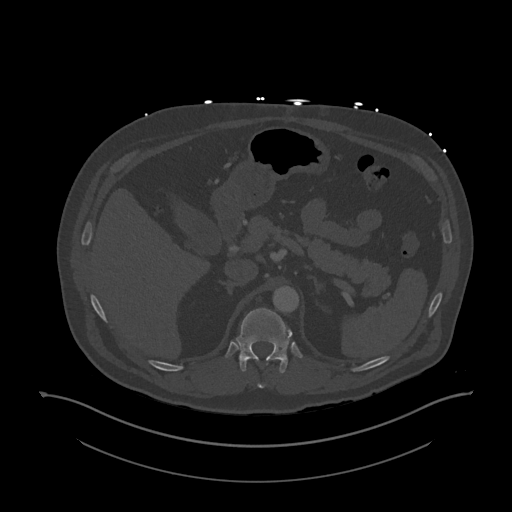
[im 445/585  soft-tissue]
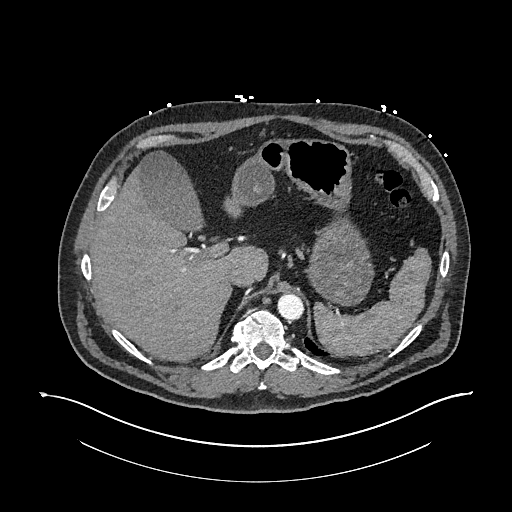
[im 501/585  soft-tissue]
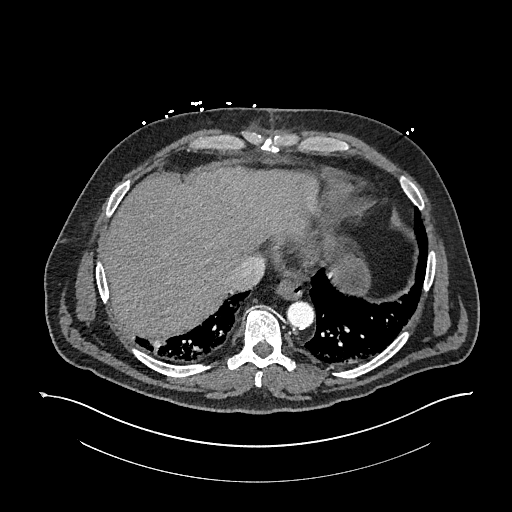
[im 557/585  soft-tissue]
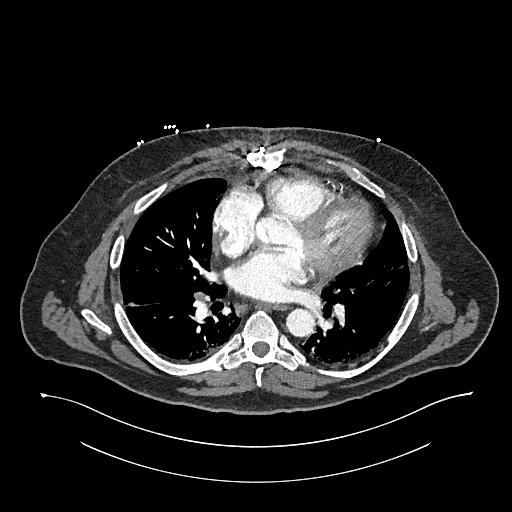

[Series 7: coronals · coronal · 0.86mm/px · 3 of 151 slices shown]
[im 38/151  soft-tissue]
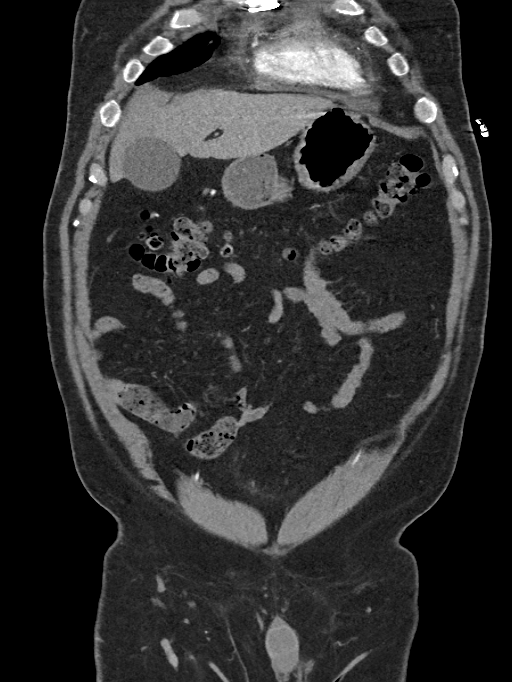
[im 76/151  soft-tissue]
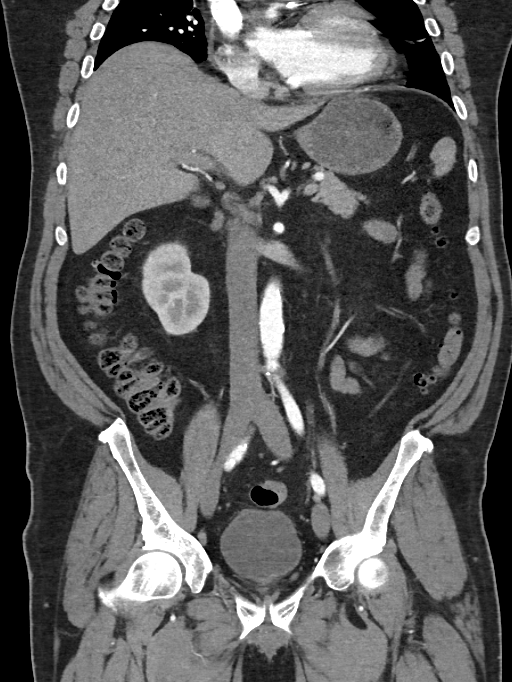
[im 113/151  soft-tissue]
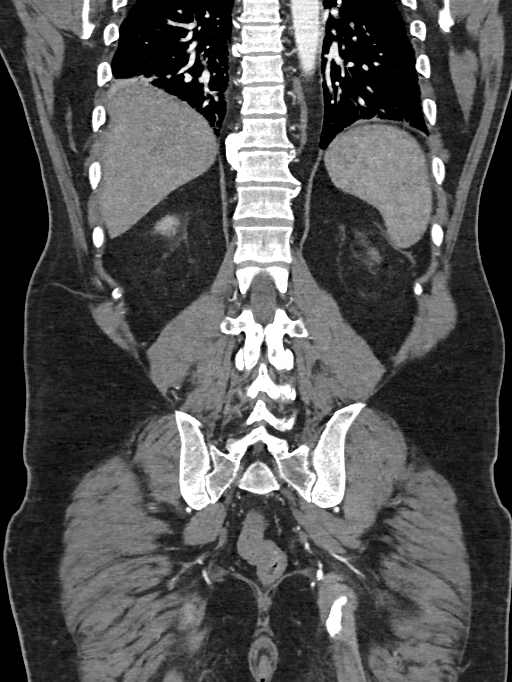

[15 of 46 positions shown; findings below may reference images not displayed]

FINDINGS: The abdominal aorta is normal in caliber, with mild calcified
plaque. There is no aortic stenosis. There is no dissection. There
is no aneurysm. The celiac axis, superior mesenteric artery and
inferior mesenteric artery are widely patent. Each kidney is served
by 2 renal arteries. All renal arteries are widely patent. The iliac
arteries are widely patent with moderate calcified plaque.

Review of the MIP images confirms the above findings.

Nonvascular findings:

Lower chest: Mild atelectatic appearing linear opacities in both
bases.

Hepatobiliary: Insert hepatobiliary

Pancreas: Normal

Spleen: Normal

Adrenals/Urinary Tract: The adrenals and kidneys are normal in
appearance. There is no urinary calculus evident. There is no
hydronephrosis or ureteral dilatation. Collecting systems and
ureters appear unremarkable.

Stomach/Bowel: There are normal appearances of the stomach, small
bowel and appendix. There is extensive colonic diverticulosis.

Reproductive: Unremarkable

Other: No acute inflammatory changes are evident in the abdomen or
pelvis. There is no adenopathy. There is no ascites.

Musculoskeletal: No significant skeletal lesion. There is a small
fat containing umbilical hernia. There are small fat containing
inguinal hernias bilaterally.
IMPRESSION: 1. Normal caliber aorta with mild plaque. No dissection. Major
branches of the aorta are patent and normal in caliber.
2. No acute findings are evident in the abdomen or pelvis.
3. Colonic diverticulosis.
4. Small fat containing umbilical hernia. Small fat containing
inguinal hernias bilaterally.

## 2017-05-29 MED ORDER — MORPHINE SULFATE (PF) 4 MG/ML IV SOLN: 4.5000 mg | Freq: Once | INTRAVENOUS | Status: DC

## 2017-05-29 MED ORDER — MORPHINE SULFATE (PF) 10 MG/ML IV SOLN: 4.5000 mg | Freq: Once | INTRAVENOUS | Status: DC

## 2017-05-29 MED ORDER — TECHNETIUM TC 99M MEBROFENIN IV KIT
5.4400 | PACK | Freq: Once | INTRAVENOUS | Status: AC | PRN
  Administered 2017-05-29: 5.44 via INTRAVENOUS

## 2017-05-29 NOTE — Telephone Encounter (Signed)
Pt scheduled to see Dr. Excell Seltzer at [facility] for his gallbladder 06/04/17@9 :30am, 9am. Pt aware of appt and records faxed to [phone].

## 2017-06-01 ENCOUNTER — Telehealth: Payer: Self-pay

## 2017-06-01 NOTE — Telephone Encounter (Signed)
Thank you :)

## 2017-06-01 NOTE — Telephone Encounter (Signed)
Dr. Excell Seltzer had a cancellation tomorrow at 4pm, pt to arrive there at 3:30pm. Pt aware of new appt date and time.

## 2017-06-02 ENCOUNTER — Ambulatory Visit: Payer: Self-pay | Admitting: General Surgery

## 2017-06-02 DIAGNOSIS — K8000 Calculus of gallbladder with acute cholecystitis without obstruction: Secondary | ICD-10-CM | POA: Diagnosis not present

## 2017-06-04 ENCOUNTER — Telehealth: Payer: Self-pay | Admitting: *Deleted

## 2017-06-04 NOTE — Telephone Encounter (Signed)
   [facility] Pre-operative Risk Assessment    Request for surgical clearance:  1. What type of surgery is being performed? GALLBLADDER REMOVAL  2. When is this surgery scheduled? TBD   3. What type of clearance is required (medical clearance vs. Pharmacy clearance to hold med vs. Both)? BOTH  4. Are there any medications that need to be held prior to surgery and how long? STATES NEED INSTRUCTIONS AS TO IF THE PATIENT SHOULD HOLD MEDICATION PREOPERATIVELY.   5. Practice name and name of physician performing surgery? [facility]--BENJAMIN Edward Jolly, MD   6. What is your office phone number [phone]    7.   What is your office fax number [phone]  8.   Anesthesia type (None, local, MAC, general) ?  GENERAL ANESTHESIA    Jourdain, [place] 06/04/2017, 12:42 PM  _________________________________________________________________   (provider comments below)

## 2017-06-04 NOTE — Telephone Encounter (Signed)
   Primary cardiologist: Dr. Burt Knack  Will route to Dr. Burt Knack for comment on holding Plavix for cholecystectomy.  Following input from Dr. Burt Knack, APP covering preop who will need to contact patient to assess for any clinical change since his last office visit.  Dr. Burt Knack, please provide input regarding patient's Plavix and route to preop approval.  62 year old male with history of CAD status post multiple PCI's, ultimately requiring CABG in 2017 with LIMA to LAD, free RIMA to ramus, and SVG to PDA.  He was most recently seen on 01/21/2017 for routine follow-up and was without angina at that time.

## 2017-06-05 NOTE — Telephone Encounter (Signed)
Attempted to reach pt but mailbox was full so could not leave a message.  please try again.

## 2017-06-05 NOTE — Telephone Encounter (Signed)
Ok to hold plavix 5-7 days prior to surgery. Continue ASA 81 mg if bleeding risk not excessive. thanks

## 2017-06-08 NOTE — Telephone Encounter (Signed)
Given past medical history and time since last visit, based on ACC/AHA guidelines, Leonard Edwards would be at acceptable risk for the planned procedure without further cardiovascular testing.  He walks 3 miles 4days/week without any symptoms.   I will route this recommendation to the requesting party via Epic fax function and remove from pre-op pool.  Please call with questions.  [place], [place] 06/08/2017, 1:59 PM

## 2017-06-23 ENCOUNTER — Encounter (INDEPENDENT_AMBULATORY_CARE_PROVIDER_SITE_OTHER): Payer: BLUE CROSS/BLUE SHIELD | Admitting: Ophthalmology

## 2017-06-23 DIAGNOSIS — I10 Essential (primary) hypertension: Secondary | ICD-10-CM

## 2017-06-23 DIAGNOSIS — H2513 Age-related nuclear cataract, bilateral: Secondary | ICD-10-CM

## 2017-06-23 DIAGNOSIS — D3132 Benign neoplasm of left choroid: Secondary | ICD-10-CM

## 2017-06-23 DIAGNOSIS — H43813 Vitreous degeneration, bilateral: Secondary | ICD-10-CM | POA: Diagnosis not present

## 2017-06-23 DIAGNOSIS — H35033 Hypertensive retinopathy, bilateral: Secondary | ICD-10-CM | POA: Diagnosis not present

## 2017-06-30 NOTE — Progress Notes (Addendum)
01/21/2017-in Epic- EKG  05/29/2017- noted in Epic-US abdomen limited RUQ   10/31/2015- noted in Epic- Left Heart Cath and Coronary Stent intervention

## 2017-06-30 NOTE — Patient Instructions (Addendum)
[place]  06/30/2017   Your procedure is scheduled on: Tuesday 07/07/2017  Report to [facility] Main  Entrance              Report to admitting at  0530  AM    Call this number if you have problems the morning of surgery [phone]    Remember: Do not eat food :After Midnight.    NO SOLID FOOD AFTER MIDNIGHT THE NIGHT PRIOR TO SURGERY. NOTHING BY MOUTH EXCEPT CLEAR LIQUIDS UNTIL 3 HOURS PRIOR TO Leonard Edwards SURGERY. PLEASE FINISH ENSURE DRINK PER SURGEON ORDER 3 HOURS PRIOR TO SCHEDULED SURGERY TIME WHICH NEEDS TO BE COMPLETED AT   0430 am.    CLEAR LIQUID DIET   Foods Allowed                                                                     Foods Excluded  Coffee and tea, regular and decaf                             liquids that you cannot  Plain Jell-O in any flavor                                             see through such as: Fruit ices (not with fruit pulp)                                     milk, soups, orange juice  Iced Popsicles                                    All solid food Carbonated beverages, regular and diet                                    Cranberry, grape and apple juices Sports drinks like Gatorade Lightly seasoned clear broth or consume(fat free) Sugar, honey syrup  Sample Menu Breakfast                                Lunch                                     Supper Cranberry juice                    Beef broth                            Chicken broth Jell-O  Grape juice                           Apple juice Coffee or tea                        Jell-O                                      Popsicle                                                Coffee or tea                        Coffee or tea  _____________________________________________________________________     Take these medicines the morning of surgery with A SIP OF WATER: Amlodipine (Norvasc), Metoprolol succinate  (Toprol-XL), Pantoprazole (Protonix), use Flonase nasal spray if needed                                 You may not have any metal on your body including hair pins and              piercings  Do not wear jewelry, make-up, lotions, powders or perfumes, deodorant                        Men may shave face and neck.   Do not bring valuables to the hospital. [place].  Contacts, dentures or bridgework may not be worn into surgery.  Leave suitcase in the car. After surgery it may be brought to your room.     Patients discharged the day of surgery will not be allowed to drive home.  Name and phone number of your driver: spouse- Leonard Edwards                Please read over the following fact sheets you were given: _____________________________________________________________________             [facility] - Preparing for Surgery Before surgery, you can play an important role.  Because skin is not sterile, your skin needs to be as free of germs as possible.  You can reduce the number of germs on your skin by washing with CHG (chlorahexidine gluconate) soap before surgery.  CHG is an antiseptic cleaner which kills germs and bonds with the skin to continue killing germs even after washing. Please DO NOT use if you have an allergy to CHG or antibacterial soaps.  If your skin becomes reddened/irritated stop using the CHG and inform your nurse when you arrive at Short Stay. Do not shave (including legs and underarms) for at least 48 hours prior to the first CHG shower.  You may shave your face/neck. Please follow these instructions carefully:  1.  Shower with CHG Soap the night before surgery and the  morning of Surgery.  2.  If you choose to wash your hair, wash your hair first as usual with your  normal  shampoo.  3.  After you shampoo, rinse your hair and body thoroughly to remove the  shampoo.                           4.  Use CHG as you would any  other liquid soap.  You can apply chg directly  to the skin and wash                       Gently with a scrungie or clean washcloth.  5.  Apply the CHG Soap to your body ONLY FROM THE NECK DOWN.   Do not use on face/ open                           Wound or open sores. Avoid contact with eyes, ears mouth and genitals (private parts).                       Wash face,  Genitals (private parts) with your normal soap.             6.  Wash thoroughly, paying special attention to the area where your surgery  will be performed.  7.  Thoroughly rinse your body with warm water from the neck down.  8.  DO NOT shower/wash with your normal soap after using and rinsing off  the CHG Soap.                9.  Pat yourself dry with a clean towel.            10.  Wear clean pajamas.            11.  Place clean sheets on your bed the night of your first shower and do not  sleep with pets. Day of Surgery : Do not apply any lotions/deodorants the morning of surgery.  Please wear clean clothes to the hospital/surgery center.  FAILURE TO FOLLOW THESE INSTRUCTIONS MAY RESULT IN THE CANCELLATION OF YOUR SURGERY PATIENT SIGNATURE_________________________________  NURSE SIGNATURE__________________________________  ________________________________________________________________________

## 2017-07-01 ENCOUNTER — Encounter (HOSPITAL_COMMUNITY): Payer: Self-pay

## 2017-07-01 ENCOUNTER — Other Ambulatory Visit: Payer: Self-pay

## 2017-07-01 ENCOUNTER — Encounter (HOSPITAL_COMMUNITY)
Admission: RE | Admit: 2017-07-01 | Discharge: 2017-07-01 | Disposition: A | Discharge status: Home / Self Care | Payer: BLUE CROSS/BLUE SHIELD | Source: Ambulatory Visit | Attending: General Surgery | Admitting: General Surgery

## 2017-07-01 DIAGNOSIS — Z01818 Encounter for other preprocedural examination: Secondary | ICD-10-CM | POA: Insufficient documentation

## 2017-07-01 DIAGNOSIS — K429 Umbilical hernia without obstruction or gangrene: Secondary | ICD-10-CM | POA: Insufficient documentation

## 2017-07-01 DIAGNOSIS — K8010 Calculus of gallbladder with chronic cholecystitis without obstruction: Secondary | ICD-10-CM | POA: Insufficient documentation

## 2017-07-01 LAB — BASIC METABOLIC PANEL
Anion gap: 12 (ref 5–15)
BUN: 13 mg/dL (ref 6–20)
CHLORIDE: 105 mmol/L (ref 101–111)
CO2: 25 mmol/L (ref 22–32)
CREATININE: 0.88 mg/dL (ref 0.61–1.24)
Calcium: 9.2 mg/dL (ref 8.9–10.3)
GFR calc Af Amer: >60 mL/min (ref >60)
GFR calc non Af Amer: >60 mL/min (ref >60)
Glucose, Bld: 101 mg/dL — ABNORMAL HIGH (ref 65–99)
Potassium: 4 mmol/L (ref 3.5–5.1)
SODIUM: 142 mmol/L (ref 135–145)

## 2017-07-01 LAB — CBC
HCT: 45.6 % (ref 39.0–52.0)
Hemoglobin: 16 g/dL (ref 13.0–17.0)
MCH: 31.5 pg (ref 26.0–34.0)
MCHC: 35.1 g/dL (ref 30.0–36.0)
MCV: 89.8 fL (ref 78.0–100.0)
PLATELETS: 300 10*3/uL (ref 150–400)
RBC: 5.08 MIL/uL (ref 4.22–5.81)
RDW: 12.6 % (ref 11.5–15.5)
WBC: 7.9 10*3/uL (ref 4.0–10.5)

## 2017-07-01 NOTE — Progress Notes (Signed)
   07/01/17 0945  OBSTRUCTIVE SLEEP APNEA  Have you ever been diagnosed with sleep apnea through a sleep study? No  Do you snore loudly (loud enough to be heard through closed doors)?  0  Do you often feel tired, fatigued, or sleepy during the daytime (such as falling asleep during driving or talking to someone)? 0  Has anyone observed you stop breathing during your sleep? 0  Do you have, or are you being treated for high blood pressure? 1  BMI more than 35 kg/m2? 1  Age > 50 (1-yes) 1  Neck circumference greater than:Male 16 inches or larger, Male 17inches or larger? 1  Male Gender (Yes=1) 1  Obstructive Sleep Apnea Score 5  Score 5 or greater  Results sent to PCP

## 2017-07-02 ENCOUNTER — Ambulatory Visit: Payer: Self-pay | Admitting: General Surgery

## 2017-07-02 DIAGNOSIS — K8000 Calculus of gallbladder with acute cholecystitis without obstruction: Secondary | ICD-10-CM | POA: Diagnosis not present

## 2017-07-06 ENCOUNTER — Encounter (HOSPITAL_COMMUNITY): Payer: Self-pay | Admitting: Anesthesiology

## 2017-07-06 NOTE — Anesthesia Preprocedure Evaluation (Addendum)
Anesthesia Evaluation  Patient identified by MRN, date of birth, ID band Patient awake    Reviewed: Allergy & Precautions, NPO status , Patient's Chart, lab work & pertinent test results  Airway Mallampati: II  TM Distance: >3 FB Neck ROM: Full    Dental  (+) Teeth Intact, Dental Advisory Given, Caps   Pulmonary pneumonia, resolved,    Pulmonary exam normal breath sounds clear to auscultation       Cardiovascular hypertension, + angina with exertion + CAD, + Cardiac Stents and + CABG  Normal cardiovascular exam Rhythm:Regular Rate:Normal     Neuro/Psych  Neuromuscular disease negative psych ROS   GI/Hepatic Neg liver ROS, hiatal hernia, GERD  Medicated and Controlled,Cholelithiasis Hx/o Esophageal stricture   Endo/Other  Obesity Hyperlipidemia Gout  Renal/GU negative Renal ROS  negative genitourinary   Musculoskeletal  (+) Fibromyalgia -  Abdominal (+) + obese,   Peds  Hematology Plavix - last dose 7 days ago   Anesthesia Other Findings   Reproductive/Obstetrics                          Anesthesia Physical Anesthesia Plan  ASA: III  Anesthesia Plan: General   Post-op Pain Management:    Induction: Intravenous, Rapid sequence and Cricoid pressure planned  PONV Risk Score and Plan: 3 and Ondansetron, Dexamethasone and Treatment may vary due to age or medical condition  Airway Management Planned: Oral ETT  Additional Equipment:   Intra-op Plan:   Post-operative Plan: Extubation in OR  Informed Consent: I have reviewed the patients History and Physical, chart, labs and discussed the procedure including the risks, benefits and alternatives for the proposed anesthesia with the patient or authorized representative who has indicated his/her understanding and acceptance.   Dental advisory given  Plan Discussed with: CRNA and Surgeon  Anesthesia Plan Comments:        Anesthesia  Quick Evaluation

## 2017-07-07 ENCOUNTER — Ambulatory Visit (HOSPITAL_COMMUNITY): Payer: BLUE CROSS/BLUE SHIELD

## 2017-07-07 ENCOUNTER — Ambulatory Visit (HOSPITAL_COMMUNITY)
Admission: RE | Admit: 2017-07-07 | Discharge: 2017-07-07 | Disposition: A | Discharge status: Home / Self Care | Payer: BLUE CROSS/BLUE SHIELD | Source: Ambulatory Visit | Attending: General Surgery | Admitting: General Surgery

## 2017-07-07 ENCOUNTER — Encounter (HOSPITAL_COMMUNITY): Payer: Self-pay | Admitting: *Deleted

## 2017-07-07 ENCOUNTER — Ambulatory Visit (HOSPITAL_COMMUNITY): Payer: BLUE CROSS/BLUE SHIELD | Admitting: Anesthesiology

## 2017-07-07 ENCOUNTER — Encounter (HOSPITAL_COMMUNITY)
Admission: RE | Disposition: A | Discharge status: Home / Self Care | Payer: Self-pay | Source: Ambulatory Visit | Attending: General Surgery

## 2017-07-07 DIAGNOSIS — I10 Essential (primary) hypertension: Secondary | ICD-10-CM | POA: Insufficient documentation

## 2017-07-07 DIAGNOSIS — Z955 Presence of coronary angioplasty implant and graft: Secondary | ICD-10-CM | POA: Insufficient documentation

## 2017-07-07 DIAGNOSIS — Z6835 Body mass index (BMI) 35.0-35.9, adult: Secondary | ICD-10-CM | POA: Diagnosis not present

## 2017-07-07 DIAGNOSIS — Z7982 Long term (current) use of aspirin: Secondary | ICD-10-CM | POA: Diagnosis not present

## 2017-07-07 DIAGNOSIS — Z7951 Long term (current) use of inhaled steroids: Secondary | ICD-10-CM | POA: Diagnosis not present

## 2017-07-07 DIAGNOSIS — K8020 Calculus of gallbladder without cholecystitis without obstruction: Secondary | ICD-10-CM | POA: Diagnosis not present

## 2017-07-07 DIAGNOSIS — K660 Peritoneal adhesions (postprocedural) (postinfection): Secondary | ICD-10-CM | POA: Diagnosis not present

## 2017-07-07 DIAGNOSIS — K8010 Calculus of gallbladder with chronic cholecystitis without obstruction: Secondary | ICD-10-CM | POA: Insufficient documentation

## 2017-07-07 DIAGNOSIS — Z7902 Long term (current) use of antithrombotics/antiplatelets: Secondary | ICD-10-CM | POA: Insufficient documentation

## 2017-07-07 DIAGNOSIS — M797 Fibromyalgia: Secondary | ICD-10-CM | POA: Diagnosis not present

## 2017-07-07 DIAGNOSIS — K429 Umbilical hernia without obstruction or gangrene: Secondary | ICD-10-CM | POA: Insufficient documentation

## 2017-07-07 DIAGNOSIS — K219 Gastro-esophageal reflux disease without esophagitis: Secondary | ICD-10-CM | POA: Insufficient documentation

## 2017-07-07 DIAGNOSIS — I25118 Atherosclerotic heart disease of native coronary artery with other forms of angina pectoris: Secondary | ICD-10-CM | POA: Diagnosis not present

## 2017-07-07 DIAGNOSIS — Z79899 Other long term (current) drug therapy: Secondary | ICD-10-CM | POA: Diagnosis not present

## 2017-07-07 DIAGNOSIS — E785 Hyperlipidemia, unspecified: Secondary | ICD-10-CM | POA: Diagnosis not present

## 2017-07-07 DIAGNOSIS — Z951 Presence of aortocoronary bypass graft: Secondary | ICD-10-CM | POA: Diagnosis not present

## 2017-07-07 DIAGNOSIS — E669 Obesity, unspecified: Secondary | ICD-10-CM | POA: Diagnosis not present

## 2017-07-07 HISTORY — PX: UMBILICAL HERNIA REPAIR: SHX196

## 2017-07-07 HISTORY — PX: CHOLECYSTECTOMY: SHX55

## 2017-07-07 SURGERY — LAPAROSCOPIC CHOLECYSTECTOMY WITH INTRAOPERATIVE CHOLANGIOGRAM
Anesthesia: General | Site: Abdomen

## 2017-07-07 MED ORDER — MIDAZOLAM HCL 2 MG/2ML IJ SOLN
INTRAMUSCULAR | Status: AC
  Filled 2017-07-07: qty 2

## 2017-07-07 MED ORDER — GLYCOPYRROLATE PF 0.2 MG/ML IJ SOSY
PREFILLED_SYRINGE | INTRAMUSCULAR | Status: DC | PRN
  Administered 2017-07-07: .2 mg via INTRAVENOUS

## 2017-07-07 MED ORDER — GABAPENTIN 300 MG PO CAPS
300.0000 mg | ORAL_CAPSULE | ORAL | Status: AC
  Administered 2017-07-07: 300 mg via ORAL

## 2017-07-07 MED ORDER — ROCURONIUM BROMIDE 10 MG/ML (PF) SYRINGE
PREFILLED_SYRINGE | INTRAVENOUS | Status: DC | PRN
  Administered 2017-07-07 (×2): 5 mg via INTRAVENOUS
  Administered 2017-07-07: 50 mg via INTRAVENOUS
  Administered 2017-07-07: 5 mg via INTRAVENOUS

## 2017-07-07 MED ORDER — MIDAZOLAM HCL 5 MG/5ML IJ SOLN
INTRAMUSCULAR | Status: DC | PRN
  Administered 2017-07-07: 2 mg via INTRAVENOUS

## 2017-07-07 MED ORDER — LIDOCAINE 2% (20 MG/ML) 5 ML SYRINGE
INTRAMUSCULAR | Status: AC
  Filled 2017-07-07: qty 15

## 2017-07-07 MED ORDER — ROCURONIUM BROMIDE 10 MG/ML (PF) SYRINGE
PREFILLED_SYRINGE | INTRAVENOUS | Status: AC
  Filled 2017-07-07: qty 10

## 2017-07-07 MED ORDER — HYDROMORPHONE HCL 1 MG/ML IJ SOLN
INTRAMUSCULAR | Status: AC
  Filled 2017-07-07: qty 1

## 2017-07-07 MED ORDER — PHENYLEPHRINE 40 MCG/ML (10ML) SYRINGE FOR IV PUSH (FOR BLOOD PRESSURE SUPPORT)
PREFILLED_SYRINGE | INTRAVENOUS | Status: AC
  Filled 2017-07-07: qty 10

## 2017-07-07 MED ORDER — IOPAMIDOL (ISOVUE-300) INJECTION 61%
INTRAVENOUS | Status: AC
  Filled 2017-07-07: qty 50

## 2017-07-07 MED ORDER — ACETAMINOPHEN 500 MG PO TABS
ORAL_TABLET | ORAL | Status: AC
  Filled 2017-07-07: qty 2

## 2017-07-07 MED ORDER — DEXAMETHASONE SODIUM PHOSPHATE 10 MG/ML IJ SOLN
INTRAMUSCULAR | Status: DC | PRN
  Administered 2017-07-07: 10 mg via INTRAVENOUS

## 2017-07-07 MED ORDER — SUGAMMADEX SODIUM 500 MG/5ML IV SOLN
INTRAVENOUS | Status: AC
  Filled 2017-07-07: qty 5

## 2017-07-07 MED ORDER — CELECOXIB 200 MG PO CAPS
ORAL_CAPSULE | ORAL | Status: AC
  Filled 2017-07-07: qty 1

## 2017-07-07 MED ORDER — SUGAMMADEX SODIUM 200 MG/2ML IV SOLN
INTRAVENOUS | Status: DC | PRN
  Administered 2017-07-07: 250 mg via INTRAVENOUS

## 2017-07-07 MED ORDER — GLYCOPYRROLATE 0.2 MG/ML IV SOSY
PREFILLED_SYRINGE | INTRAVENOUS | Status: AC
  Filled 2017-07-07: qty 3

## 2017-07-07 MED ORDER — FENTANYL CITRATE (PF) 100 MCG/2ML IJ SOLN
INTRAMUSCULAR | Status: DC | PRN
  Administered 2017-07-07 (×2): 50 ug via INTRAVENOUS
  Administered 2017-07-07: 100 ug via INTRAVENOUS

## 2017-07-07 MED ORDER — ONDANSETRON HCL 4 MG/2ML IJ SOLN
INTRAMUSCULAR | Status: AC
  Filled 2017-07-07: qty 8

## 2017-07-07 MED ORDER — HYDROMORPHONE HCL 1 MG/ML IJ SOLN
0.2500 mg | INTRAMUSCULAR | Status: DC | PRN
  Administered 2017-07-07 (×2): 0.25 mg via INTRAVENOUS

## 2017-07-07 MED ORDER — FENTANYL CITRATE (PF) 100 MCG/2ML IJ SOLN
INTRAMUSCULAR | Status: AC
  Filled 2017-07-07: qty 2

## 2017-07-07 MED ORDER — GLYCOPYRROLATE 0.2 MG/ML IV SOSY
PREFILLED_SYRINGE | INTRAVENOUS | Status: AC
  Filled 2017-07-07: qty 9

## 2017-07-07 MED ORDER — LACTATED RINGERS IV SOLN: INTRAVENOUS | Status: DC

## 2017-07-07 MED ORDER — ACETAMINOPHEN 500 MG PO TABS
1000.0000 mg | ORAL_TABLET | ORAL | Status: AC
  Administered 2017-07-07: 1000 mg via ORAL

## 2017-07-07 MED ORDER — HYDROCODONE-ACETAMINOPHEN 7.5-325 MG PO TABS: 1.0000 | ORAL_TABLET | Freq: Once | ORAL | Status: DC | PRN

## 2017-07-07 MED ORDER — CHLORHEXIDINE GLUCONATE CLOTH 2 % EX PADS: 6.0000 | MEDICATED_PAD | Freq: Once | CUTANEOUS | Status: DC

## 2017-07-07 MED ORDER — MEPERIDINE HCL 50 MG/ML IJ SOLN: 6.2500 mg | INTRAMUSCULAR | Status: DC | PRN

## 2017-07-07 MED ORDER — CELECOXIB 200 MG PO CAPS
200.0000 mg | ORAL_CAPSULE | ORAL | Status: AC
  Administered 2017-07-07: 200 mg via ORAL

## 2017-07-07 MED ORDER — ALBUTEROL SULFATE HFA 108 (90 BASE) MCG/ACT IN AERS
INHALATION_SPRAY | RESPIRATORY_TRACT | Status: AC
  Filled 2017-07-07: qty 6.7

## 2017-07-07 MED ORDER — BUPIVACAINE-EPINEPHRINE 0.25% -1:200000 IJ SOLN
INTRAMUSCULAR | Status: DC | PRN
  Administered 2017-07-07: 22 mL

## 2017-07-07 MED ORDER — PHENYLEPHRINE 40 MCG/ML (10ML) SYRINGE FOR IV PUSH (FOR BLOOD PRESSURE SUPPORT)
PREFILLED_SYRINGE | INTRAVENOUS | Status: DC | PRN
  Administered 2017-07-07 (×2): 80 ug via INTRAVENOUS

## 2017-07-07 MED ORDER — LACTATED RINGERS IV SOLN
INTRAVENOUS | Status: DC | PRN
  Administered 2017-07-07 (×2): via INTRAVENOUS

## 2017-07-07 MED ORDER — BUPIVACAINE-EPINEPHRINE (PF) 0.25% -1:200000 IJ SOLN
INTRAMUSCULAR | Status: AC
  Filled 2017-07-07: qty 30

## 2017-07-07 MED ORDER — DEXAMETHASONE SODIUM PHOSPHATE 10 MG/ML IJ SOLN
INTRAMUSCULAR | Status: AC
  Filled 2017-07-07: qty 2

## 2017-07-07 MED ORDER — SUGAMMADEX SODIUM 500 MG/5ML IV SOLN
INTRAVENOUS | Status: AC
  Filled 2017-07-07: qty 10

## 2017-07-07 MED ORDER — EPHEDRINE SULFATE 50 MG/ML IJ SOLN
INTRAMUSCULAR | Status: AC
  Filled 2017-07-07: qty 3

## 2017-07-07 MED ORDER — LACTATED RINGERS IR SOLN
Status: DC | PRN
  Administered 2017-07-07: 1000 mL

## 2017-07-07 MED ORDER — ONDANSETRON HCL 4 MG/2ML IJ SOLN
INTRAMUSCULAR | Status: DC | PRN
  Administered 2017-07-07: 4 mg via INTRAVENOUS

## 2017-07-07 MED ORDER — CEFAZOLIN SODIUM-DEXTROSE 2-4 GM/100ML-% IV SOLN
INTRAVENOUS | Status: AC
  Filled 2017-07-07: qty 100

## 2017-07-07 MED ORDER — PROMETHAZINE HCL 25 MG/ML IJ SOLN: 6.2500 mg | INTRAMUSCULAR | Status: DC | PRN

## 2017-07-07 MED ORDER — PROPOFOL 10 MG/ML IV BOLUS
INTRAVENOUS | Status: DC | PRN
  Administered 2017-07-07: 200 mg via INTRAVENOUS

## 2017-07-07 MED ORDER — GABAPENTIN 300 MG PO CAPS
ORAL_CAPSULE | ORAL | Status: AC
  Filled 2017-07-07: qty 1

## 2017-07-07 MED ORDER — CEFAZOLIN SODIUM-DEXTROSE 2-4 GM/100ML-% IV SOLN
2.0000 g | INTRAVENOUS | Status: AC
  Administered 2017-07-07: 2 g via INTRAVENOUS

## 2017-07-07 MED ORDER — LIDOCAINE 2% (20 MG/ML) 5 ML SYRINGE
INTRAMUSCULAR | Status: AC
  Filled 2017-07-07: qty 10

## 2017-07-07 MED ORDER — LIDOCAINE 2% (20 MG/ML) 5 ML SYRINGE
INTRAMUSCULAR | Status: DC | PRN
  Administered 2017-07-07: 100 mg via INTRAVENOUS

## 2017-07-07 MED ORDER — PROPOFOL 10 MG/ML IV BOLUS
INTRAVENOUS | Status: AC
  Filled 2017-07-07: qty 40

## 2017-07-07 MED ORDER — IOPAMIDOL (ISOVUE-300) INJECTION 61%
INTRAVENOUS | Status: DC | PRN
  Administered 2017-07-07: 50 mL via INTRAVENOUS

## 2017-07-07 MED ORDER — OXYCODONE HCL 5 MG PO TABS: 5.0000 mg | ORAL_TABLET | Freq: Four times a day (QID) | ORAL | 0 refills | Status: DC | PRN

## 2017-07-07 MED ORDER — SODIUM CHLORIDE 0.9 % IJ SOLN
INTRAMUSCULAR | Status: AC
  Filled 2017-07-07: qty 20

## 2017-07-07 SURGICAL SUPPLY — 72 items
ADH SKN CLS APL DERMABOND .7 (GAUZE/BANDAGES/DRESSINGS) ×1
APL SKNCLS STERI-STRIP NONHPOA (GAUZE/BANDAGES/DRESSINGS)
APL SRG 38 LTWT LNG FL B (MISCELLANEOUS) ×1
APPLICATOR ARISTA FLEXITIP XL (MISCELLANEOUS) ×1 IMPLANT
APPLIER CLIP ROT 10 11.4 M/L (STAPLE) ×2
APR CLP MED LRG 11.4X10 (STAPLE) ×1
BAG SPEC RTRVL 10 TROC 200 (ENDOMECHANICALS)
BENZOIN TINCTURE PRP APPL 2/3 (GAUZE/BANDAGES/DRESSINGS) IMPLANT
BLADE HEX COATED 2.75 (ELECTRODE) ×2 IMPLANT
BLADE SURG SZ10 CARB STEEL (BLADE) ×2 IMPLANT
CABLE HIGH FREQUENCY MONO STRZ (ELECTRODE) ×2 IMPLANT
CATH REDDICK CHOLANGI 4FR 50CM (CATHETERS) IMPLANT
CHLORAPREP W/TINT 26ML (MISCELLANEOUS) ×2 IMPLANT
CLIP APPLIE ROT 10 11.4 M/L (STAPLE) ×1 IMPLANT
COVER MAYO STAND STRL (DRAPES) ×2 IMPLANT
COVER SURGICAL LIGHT HANDLE (MISCELLANEOUS) ×2 IMPLANT
DECANTER SPIKE VIAL GLASS SM (MISCELLANEOUS) ×2 IMPLANT
DERMABOND ADVANCED (GAUZE/BANDAGES/DRESSINGS) ×1
DERMABOND ADVANCED .7 DNX12 (GAUZE/BANDAGES/DRESSINGS) ×1 IMPLANT
DRAPE C-ARM 42X120 X-RAY (DRAPES) ×2 IMPLANT
DRAPE LAPAROTOMY T 102X78X121 (DRAPES) ×2 IMPLANT
ELECT CAUTERY BLADE 6.4 (BLADE) ×1 IMPLANT
ELECT PENCIL ROCKER SW 15FT (MISCELLANEOUS) ×3 IMPLANT
ELECT REM PT RETURN 15FT ADLT (MISCELLANEOUS) ×2 IMPLANT
ENDOLOOP SUT PDS II  0 18 (SUTURE) ×1
ENDOLOOP SUT PDS II 0 18 (SUTURE) IMPLANT
GAUZE SPONGE 4X4 12PLY STRL (GAUZE/BANDAGES/DRESSINGS) IMPLANT
GLOVE BIOGEL PI IND STRL 7.0 (GLOVE) ×1 IMPLANT
GLOVE BIOGEL PI IND STRL 7.5 (GLOVE) ×1 IMPLANT
GLOVE BIOGEL PI INDICATOR 7.0 (GLOVE) ×1
GLOVE BIOGEL PI INDICATOR 7.5 (GLOVE) ×1
GLOVE ECLIPSE 7.5 STRL STRAW (GLOVE) ×2 IMPLANT
GOWN STRL REUS W/TWL LRG LVL3 (GOWN DISPOSABLE) ×2 IMPLANT
GOWN STRL REUS W/TWL XL LVL3 (GOWN DISPOSABLE) ×6 IMPLANT
HEMOSTAT ARISTA ABSORB 3G PWDR (MISCELLANEOUS) ×1 IMPLANT
HEMOSTAT SNOW SURGICEL 2X4 (HEMOSTASIS) IMPLANT
HEMOSTAT SURGICEL 4X8 (HEMOSTASIS) IMPLANT
KIT BASIN OR (CUSTOM PROCEDURE TRAY) ×2 IMPLANT
L-HOOK LAP DISP 36CM (ELECTROSURGICAL) ×2
LHOOK LAP DISP 36CM (ELECTROSURGICAL) IMPLANT
MARKER SKIN DUAL TIP RULER LAB (MISCELLANEOUS) IMPLANT
NDL HYPO 25X1 1.5 SAFETY (NEEDLE) IMPLANT
NEEDLE HYPO 22GX1.5 SAFETY (NEEDLE) ×2 IMPLANT
NEEDLE HYPO 25X1 1.5 SAFETY (NEEDLE) IMPLANT
NS IRRIG 1000ML POUR BTL (IV SOLUTION) ×2 IMPLANT
PACK BASIC VI WITH GOWN DISP (CUSTOM PROCEDURE TRAY) ×2 IMPLANT
POUCH RETRIEVAL ECOSAC 10 (ENDOMECHANICALS) IMPLANT
POUCH RETRIEVAL ECOSAC 10MM (ENDOMECHANICALS)
SCISSORS LAP 5X35 DISP (ENDOMECHANICALS) ×2 IMPLANT
SET CHOLANGIOGRAPH MIX (MISCELLANEOUS) ×2 IMPLANT
SET IRRIG TUBING LAPAROSCOPIC (IRRIGATION / IRRIGATOR) ×2 IMPLANT
SLEEVE SURGEON STRL (DRAPES) ×1 IMPLANT
SLEEVE XCEL OPT CAN 5 100 (ENDOMECHANICALS) ×2 IMPLANT
SPONGE LAP 4X18 RFD (DISPOSABLE) ×2 IMPLANT
STRIP CLOSURE SKIN 1/2X4 (GAUZE/BANDAGES/DRESSINGS) IMPLANT
SUT MNCRL AB 4-0 PS2 18 (SUTURE) ×2 IMPLANT
SUT PROLENE 0 CT 1 30 (SUTURE) IMPLANT
SUT PROLENE 0 CT 1 CR/8 (SUTURE) IMPLANT
SUT PROLENE 0 CT 2 (SUTURE) IMPLANT
SUT SILK 3 0 (SUTURE)
SUT SILK 3 0 SH CR/8 (SUTURE) IMPLANT
SUT SILK 3-0 18XBRD TIE 12 (SUTURE) IMPLANT
SUT VIC AB 3-0 SH 27 (SUTURE)
SUT VIC AB 3-0 SH 27XBRD (SUTURE) IMPLANT
SUT VICRYL 0 UR6 27IN ABS (SUTURE) ×3 IMPLANT
SYR CONTROL 10ML LL (SYRINGE) ×2 IMPLANT
TOWEL OR 17X26 10 PK STRL BLUE (TOWEL DISPOSABLE) ×2 IMPLANT
TRAY LAPAROSCOPIC (CUSTOM PROCEDURE TRAY) ×2 IMPLANT
TROCAR BLADELESS OPT 5 100 (ENDOMECHANICALS) ×2 IMPLANT
TROCAR XCEL BLUNT TIP 100MML (ENDOMECHANICALS) ×2 IMPLANT
TROCAR XCEL NON-BLD 11X100MML (ENDOMECHANICALS) ×2 IMPLANT
TUBING INSUF HEATED (TUBING) ×2 IMPLANT

## 2017-07-07 NOTE — Interval H&P Note (Signed)
History and Physical Interval Note:  07/07/2017 7:24 AM  Leonard Edwards  has presented today for surgery, with the diagnosis of CHOLEITHIASIS AND UMBILICAL HERNIA  The various methods of treatment have been discussed with the patient and family. After consideration of risks, benefits and other options for treatment, the patient has consented to  Procedure(s): LAPAROSCOPIC CHOLECYSTECTOMY WITH INTRAOPERATIVE CHOLANGIOGRAM (N/A) HERNIA REPAIR UMBILICAL ADULT (N/A) as a surgical intervention .  The patient's history has been reviewed, patient examined, no change in status, stable for surgery.  I have reviewed the patient's chart and labs.  Questions were answered to the patient's satisfaction.     Darene Lamer Johnpatrick Jenny

## 2017-07-07 NOTE — Transfer of Care (Signed)
Immediate Anesthesia Transfer of Care Note  Patient: Leonard Edwards  Procedure(s) Performed: LAPAROSCOPIC CHOLECYSTECTOMY WITH INTRAOPERATIVE CHOLANGIOGRAM (N/A Abdomen) HERNIA REPAIR UMBILICAL ADULT (N/A Abdomen)  Patient Location: PACU  Anesthesia Type:General  Level of Consciousness: awake, alert  and oriented  Airway & Oxygen Therapy: Patient Spontanous Breathing and Patient connected to face mask oxygen  Post-op Assessment: Report given to RN  Post vital signs: Reviewed and stable  Last Vitals:  Vitals Value Taken Time  BP    Temp    Pulse    Resp    SpO2      Last Pain:  Vitals:   07/07/17 0602  TempSrc:   PainSc: 0-No pain         Complications: No apparent anesthesia complications

## 2017-07-07 NOTE — Op Note (Signed)
Preoperative Diagnosis: CHOLEITHIASIS AND CHOLECYSTITIS AND UMBILICAL HERNIA  Postoprative Diagnosis: Same  Procedure: Procedure(s): LAPAROSCOPIC CHOLECYSTECTOMY WITH INTRAOPERATIVE CHOLANGIOGRAM HERNIA REPAIR UMBILICAL ADULT   Surgeon: Excell Seltzer T   Assistants: Johnathan Hausen  Anesthesia:  General endotracheal anesthesia  Indications: Patient is a 62 year old male who approximately 6 weeks ago developed acute upper abdominal pain and subsequent work-up out of town consistent with acute cholecystitis with positive HIDA and ultrasound showing gallbladder wall thickening and pericholecystic fluid.  He was treated with antibiotics and symptomatically improved.  I recommended laparoscopic cholecystectomy with cholangiogram after appeared of time to allow the inflammation to resolve.  In addition he has a symptomatic enlarging umbilical hernia 2 to 3 cm which we discussed repairing at the time of his cholecystectomy.  The procedure and indications and risks have been discussed in detail and documented elsewhere.    Procedure Detail: Patient was brought to the operating room, placed in the supine position on the operating table, and general endotracheal anesthesia induced.  PAS were in place.  He received preoperative IV antibiotics.  Patient timeout was performed and correct procedure verified after widely prepping and draping the abdomen.  I made a approximately 3 cm curvilinear incision beneath the umbilicus.  The umbilical skin was sharply dissected up off of a good-sized hernia sac.  This was completely separated from soft tissue down to the level of the fascia where it was excised with cautery excising a sac and associated preperitoneal fat.  Stay sutures of 0 Vicryl placed in the [facility] trocar placed and pneumoperitoneum established.  Under direct vision 11 mm trocar was placed subxiphoid and two 5 mm trochars along the right subcostal margin.  There were dense adhesions of omentum up of  the gallbladder.  These were taken down and were quite dense and the gallbladder was very thickened firm and distended.  It was aspirated with partial decompression.  The fundus was able to be grasped and elevated and dense fibrous adhesions of omentum were taken down off the gallbladder working down toward the infundibulum which was exposed and grasped and retracted inferolaterally.  The distal gallbladder was somewhat tediously thoroughly dissected and seen to taper down to the cystic duct.  The distal gallbladder was thoroughly dissected 360 degrees and the cystic duct isolated at the gallbladder junction still with significant chronic inflammation at this area.  The cystic duct was dissected out over about a centimeter and a half.  We could see the cystic artery more medially up onto the gallbladder wall.  This point the cystic duct was clipped of the gallbladder junction and an operative cholangiogram obtained through the cystic duct which showed good filling of a normal common bile duct and intrahepatic ducts with free flow into the duodenum and no filling defects.  The Cholangiocath was removed and the cystic duct triply clipped proximally and divided.  The gallbladder was dissected directly on its wall off the liver bed.  The cystic artery was isolated and controlled with clips.  The dissection was tedious again due to chronic inflammation with no good plane between the gallbladder and the liver.  However the dissection progressed adequately and the gallbladder was detached, placed in an eco-sac and brought out through the umbilical incision after crushing several large gallstones.  The right upper quadrant was thoroughly irrigated.  The gallbladder bed was cauterized and Arista placed on the raw surface.  There was no bleeding or evidence of injury or other problems.  The fascial defect of the umbilicus was  then closed transversely with several interrupted 0 Vicryl sutures.  Skin incisions were closed with  subcuticular Monocryl and Dermabond.  Sponge needle and instrument counts were correct.    Findings: Severe chronic cholecystitis and cholelithiasis, moderate umbilical hernia  Estimated Blood Loss:  less than 50 mL         Drains: None  Blood Given: none          Specimens: Gallbladder and contents        Complications:  * No complications entered in OR log *         Disposition: PACU - hemodynamically stable.         Condition: stable

## 2017-07-07 NOTE — Discharge Instructions (Addendum)
[facility] ______[facility] °LAPAROSCOPIC SURGERY: POST OP INSTRUCTIONS °Always review your discharge instruction sheet given to you by the facility where your surgery was performed. °IF YOU HAVE DISABILITY OR FAMILY LEAVE FORMS, YOU MUST BRING THEM TO THE OFFICE FOR PROCESSING.   °DO NOT GIVE THEM TO YOUR DOCTOR. ° °1. A prescription for pain medication may be given to you upon discharge.  Take your pain medication as prescribed, if needed.  If narcotic pain medicine is not needed, then you may take acetaminophen (Tylenol) or ibuprofen (Advil) as needed. °2. Take your usually prescribed medications unless otherwise directed. °3. If you need a refill on your pain medication, please contact your pharmacy.  They will contact our office to request authorization. Prescriptions will not be filled after 5pm or on week-ends. °4. You should follow a light diet the first few days after arrival home, such as soup and crackers, etc.  Be sure to include lots of fluids daily. °5. Most patients will experience some swelling and bruising in the area of the incisions.  Ice packs will help.  Swelling and bruising can take several days to resolve.  °6. It is common to experience some constipation if taking pain medication after surgery.  Increasing fluid intake and taking a stool softener (such as Colace) will usually help or prevent this problem from occurring.  A mild laxative (Milk of Magnesia or Miralax) should be taken according to package instructions if there are no bowel movements after 48 hours. °7. Unless discharge instructions indicate otherwise, you may remove your bandages 24-48 hours after surgery, and you may shower at that time.  You may have steri-strips (small skin tapes) in place directly over the incision.  These strips should be left on the skin for 7-10 days.  If your surgeon used skin glue on the incision, you may shower in 24 hours.  The glue will flake off over the next 2-3 weeks.  Any sutures or  staples will be removed at the office during your follow-up visit. °8. ACTIVITIES:  You may resume regular (light) daily activities beginning the next day--such as daily self-care, walking, climbing stairs--gradually increasing activities as tolerated.  You may have sexual intercourse when it is comfortable.  Refrain from any heavy lifting or straining until approved by your doctor. °a. You may drive when you are no longer taking prescription pain medication, you can comfortably wear a seatbelt, and you can safely maneuver your car and apply brakes. °b. RETURN TO WORK:  __________________________________________________________ °9. You should see your doctor in the office for a follow-up appointment approximately 2-3 weeks after your surgery.  Make sure that you call for this appointment within a day or two after you arrive home to insure a convenient appointment time. °10. OTHER INSTRUCTIONS: __________________________________________________________________________________________________________________________ __________________________________________________________________________________________________________________________ °WHEN TO CALL YOUR DOCTOR: °1. Fever over 101.0 °2. Inability to urinate °3. Continued bleeding from incision. °4. Increased pain, redness, or drainage from the incision. °5. Increasing abdominal pain ° °The clinic staff is available to answer your questions during regular business hours.  Please don’t hesitate to call and ask to speak to one of the nurses for clinical concerns.  If you have a medical emergency, go to the nearest emergency room or call 911.  A surgeon from [facility] is always on call at the hospital. °[address], [address], [place], [place]  [address] ? [address] [address], [place], [place]   [address] °[phone] ? [phone] ? FAX [phone] °Web site:   [email or web address] °

## 2017-07-07 NOTE — Anesthesia Procedure Notes (Signed)
Procedure Name: Intubation Date/Time: 07/07/2017 7:52 AM Performed by: Lavina Hamman, CRNA Pre-anesthesia Checklist: Patient identified, Emergency Drugs available, Suction available, Patient being monitored and Timeout performed Patient Re-evaluated:Patient Re-evaluated prior to induction Oxygen Delivery Method: Circle system utilized Preoxygenation: Pre-oxygenation with 100% oxygen Induction Type: IV induction Ventilation: Mask ventilation without difficulty Laryngoscope Size: Mac and 4 Grade View: Grade II Tube type: Oral Tube size: 7.5 mm Number of attempts: 1 Airway Equipment and Method: Stylet Placement Confirmation: ETT inserted through vocal cords under direct vision,  positive ETCO2,  CO2 detector and breath sounds checked- equal and bilateral Secured at: 22 cm Tube secured with: Tape Dental Injury: Teeth and Oropharynx as per pre-operative assessment  Comments: Intubation by EMS student, ATOI, easy mask, VSS.

## 2017-07-07 NOTE — Anesthesia Postprocedure Evaluation (Signed)
Anesthesia Post Note  Patient: GABRIELA GIANNELLI  Procedure(s) Performed: LAPAROSCOPIC CHOLECYSTECTOMY WITH INTRAOPERATIVE CHOLANGIOGRAM (N/A Abdomen) HERNIA REPAIR UMBILICAL ADULT (N/A Abdomen)     Patient location during evaluation: PACU Anesthesia Type: General Level of consciousness: awake and alert and oriented Pain management: pain level controlled Vital Signs Assessment: post-procedure vital signs reviewed and stable Respiratory status: spontaneous breathing, nonlabored ventilation and respiratory function stable Cardiovascular status: blood pressure returned to baseline and stable Postop Assessment: no apparent nausea or vomiting Anesthetic complications: no    Last Vitals:  Vitals:   07/07/17 1015 07/07/17 1030  BP: 134/71 135/74  Pulse: (!) 56 (!) 55  Resp: 14 15  Temp:    SpO2: 94% 95%    Last Pain:  Vitals:   07/07/17 1030  TempSrc:   PainSc: 5                  Freemon Binford A.

## 2017-07-07 NOTE — H&P (Signed)
History of Present Illness Leonard Kitchen T. Kelci Petrella MD; 07/02/2017 10:15 AM) The patient is a 61 year old male who presents for evaluation of gall stones. Patient returns for a preoperative visit prior to planned laparoscopic cholecystectomy next week. Also plan repair of umbilical hernia.  His original presentation was as follows. Patient is a very pleasant 62 year old male referred by Leonard Edwards for apparent cholecystitis. The patient states that about 10 days ago [place] he developed the onset over several hours of intense rather diffuse epigastric pressure-like pain associated with nausea. This continued to intensify and [place] beach. He has a cardiac history status post coronary artery bypass graft about 3 years ago. He was evaluated for any evidence of cardiac problem and none found with EKG and troponins. It was felt he likely was having gallbladder problems and it was suggested that he follow-up in [place]. He had called Leonard Edwards the night before and had been given a prescription for Cipro for presumed possible diverticulitis having had this diagnosis from a year previously. He initially did not take the Cipro after going to the ER. Of note his white blood count was 16,000 at that time. The following day however he began to have some intermittent low-grade fever and started the Cipro. The pain continued but started to improve. He saw Leonard Edwards 3 days after the onset. At this time the pain had become more localized to the right upper quadrant and subcostal area radiating to the back. It was less severe. HIDA scan and gallbladder ultrasound were ordered. These were performed April 26 which I reviewed. Ultrasound shows multiple gallstones and pericholecystic fluid and thickening of the gallbladder wall. HIDA was positive for nonvisualization of the gallbladder. He remains on Cipro. Fever has resolved. His pain  has gradually improved to where now he is essentially pain free with just some occasional mild twinges of discomfort and some slight lack of appetite. Bowel movements okay. No jaundice. He has not had any previous abdominal surgery. He had an episode of somewhat generalized abdominal pain about a year ago that was treated as diverticulitis. No chronic GI complaints.  He completed his course of antibiotics. He did have an episode of right upper quadrant pain about a week ago that lasted 2 or 3 days and then has completely resolved after watching his diet. Currently feeling well.  Past Medical History:  Diagnosis Date  . Benign prostatic hypertrophy   . CAD (coronary artery disease)    a. DES to distal RCA in 09/2004; DES to Ramus Intermediate 10/2009 following abnormal stress test b. 9/27 PCI with DES x3 to LCrx  . Chronic bronchitis ([place])    "in the spring"  . Esophageal stricture   . Exertional angina ([place]) 10/31/2015  . Fibromyalgia   . Gastric polyp   . GERD (gastroesophageal reflux disease)   . Hiatal hernia   . High cholesterol   . History of gout   . Hyperlipidemia   . Hypertension   . Obesity   . Pneumonia    hx of   Past Surgical History:  Procedure Laterality Date  . CARDIAC CATHETERIZATION N/A 03/29/2015   Procedure: Left Heart Cath and Coronary Angiography;  Surgeon: Sherren Mocha, MD;  Location: [place] [facility];  Service: Cardiovascular;  Laterality: N/A;  . CARDIAC CATHETERIZATION N/A 10/31/2015   Procedure: Left Heart Cath and Cors/Grafts Angiography;  Surgeon: Sherren Mocha, MD;  Location:  [facility];  Service: Cardiovascular;  Laterality: N/A;  . CARDIAC CATHETERIZATION N/A 10/31/2015   Procedure: Coronary Stent Intervention;  Surgeon: Sherren Mocha, MD;  Location: [place];  Service: Cardiovascular;  Laterality: N/A;  . COLONOSCOPY W/ POLYPECTOMY    . CORONARY ANGIOPLASTY WITH STENT PLACEMENT  09/2004-10/2009   DES to distal RCA in 09/2004;  DES to Ramus Intermediate 10/2009 following abnormal stress test  . CORONARY ARTERY BYPASS GRAFT N/A 04/06/2015   Procedure: CORONARY ARTERY BYPASS GRAFTING (CABG) x three,  using bilateral mammaries, and right leg greater saphenous vein harvested endoscopically;  Surgeon: Gaye Pollack, MD;  Location: [facility];  Service: Open Heart Surgery;  Laterality: N/A;  . ESOPHAGOGASTRODUODENOSCOPY (EGD) WITH ESOPHAGEAL DILATION    . SHOULDER ARTHROSCOPY Right 2015   scar tissue  . TEE WITHOUT CARDIOVERSION N/A 04/06/2015   Procedure: TRANSESOPHAGEAL ECHOCARDIOGRAM (TEE);  Surgeon: Gaye Pollack, MD;  Location: [facility];  Service: Open Heart Surgery;  Laterality: N/A;  . ULTRASOUND GUIDANCE FOR VASCULAR ACCESS  03/29/2015   Procedure: Ultrasound Guidance For Vascular Access;  Surgeon: Sherren Mocha, MD;  Location: [facility];  Service: Cardiovascular;;     Allergies Alean Rinne, [place]; 07/02/2017 9:57 AM) No Known Drug Allergies [07/02/2017]: Allergies Reconciled   Medication History Alean Rinne, [place]; 07/02/2017 9:57 AM) Montelukast Sodium (10MG  Tablet, Oral) Active. Metoprolol Succinate ER (50MG  Tablet ER 24HR, Oral) Active. Praluent (75MG /ML Soln Pen-inj, Subcutaneous) Active. Lisinopril (10MG  Tablet, Oral) Active. HydroCHLOROthiazide (12.5MG  Tablet, Oral) Active. Ondansetron (4MG  Tablet Disint, Oral) Active. AmLODIPine Besylate (10MG  Tablet, Oral) Active. Clopidogrel Bisulfate (75MG  Tablet, Oral) Active. Medications Reconciled  Vitals Alean Rinne RMA; 07/02/2017 9:58 AM) 07/02/2017 9:58 AM Weight: 250.6 lb Height: 70in Body Surface Area: 2.3 m Body Mass Index: 35.96 kg/m  Temp.: 98.54F  Pulse: 97 (Regular)  BP: 135/80 (Sitting, Left Arm, Standard)       Physical Exam Leonard Kitchen T. Tecla Mailloux MD; 07/02/2017 10:16 AM) The physical exam findings are as follows: Note:General: Alert, mildly obese Caucasian male, in no distress Skin: Warm and dry without rash or  infection. HEENT: No palpable masses or thyromegaly. Sclera nonicteric. Pupils equal round and reactive. Lungs: Breath sounds clear and equal. No wheezing or increased work of breathing. Cardiovascular: Regular rate and rhythm without murmer. No JVD or edema. Peripheral pulses intact. No carotid bruits. Abdomen: Nondistended. Minimal right upper quadrant tenderness without guarding. No masses palpable. No organomegaly. Reducible 2-3 cm umbilical hernia Extremities: No edema or joint swelling or deformity. No chronic venous stasis changes. Neurologic: Alert and fully oriented. Gait normal. No focal weakness. Psychiatric: Normal mood and affect. Thought content appropriate with normal judgement and insight    Assessment & Plan Leonard Kitchen T. Jacen Carlini MD; 07/02/2017 10:20 AM) CHOLELITHIASIS AND ACUTE CHOLECYSTITIS WITHOUT OBSTRUCTION (K80.00) Impression: Recent episode of severe abdominal pain entirely consistent with biliary origin and workup about 1 month ago showing evidence of acute cholecystitis with positive HIDA, ultrasound showing stones, thickened gallbladder wall and pericholecystic fluid. His symptoms are now resolved after a course of oral Cipro Cipro. LFTs were normal. He clearly has had an episode of acute cholecystitis that is responding to antibiotics. Plan to proceed with laparoscopic cholecystectomy with cholangiogram next week. Plan repair of this umbilical hernia. We again discussed procedure in detail including expected recovery its nature and risks as we have detailed elsewhere. Current Plans Schedule for Surgery  Laparoscopic cholecystectomy with intraoperative cholangiogram and repair of umbilical hernia under general anesthesia as an outpatient

## 2017-07-08 ENCOUNTER — Encounter (HOSPITAL_COMMUNITY): Payer: Self-pay | Admitting: General Surgery

## 2017-07-14 ENCOUNTER — Ambulatory Visit: Payer: BLUE CROSS/BLUE SHIELD | Admitting: Internal Medicine

## 2017-07-15 ENCOUNTER — Ambulatory Visit: Payer: BLUE CROSS/BLUE SHIELD | Admitting: Nurse Practitioner

## 2017-07-15 NOTE — Progress Notes (Deleted)
CARDIOLOGY OFFICE NOTE  Date:  07/15/2017    Leonard Edwards Date of Birth: 12/25/1955 Medical Record #[id number]  PCP:  Marton Redwood, MD  Cardiologist:  Servando Snare & ***    No chief complaint on file.   History of Present Illness: Leonard Edwards is a 62 y.o. male who presents today for a *** He has a history of CAD and hypertension. He initially presented several years ago with acute coronary syndrome and underwent PCI to right coronary artery in 2006. He developed recurrent anginal symptoms and underwent stenting of the ramus intermedius branch in 2011.  I have seen him several times over the past year or so - earlier this year in 2017 he had more chest pain - updated his Myoview which turned out abnormal and he was referred for cardiac cath - this showed an 80% distal LM stenosis. The ostial and proximal LAD had a 90% stenosis and there was 50% distal LAD stenosis. The RCA has an 80% ostial PDA stenosis. The LVEF was felt to be normal. The Ramus was large with a patent stent. The LCX is a relatively small caliber system with 60% proximal and 70% mid stenoses. He was then referred on for CABG with Dr. Cyndia Bent. He had LIMA to LAD, free RIMA to ramus and SVG to PD in March of 2017.   Post op course complicated by bronchitis and he required antibiotics.   He has done well initially after his surgery - had lost weight. Did have a visit to the ER for abdominal pain - LFTS were up - had to stop his statin. LFTs improved. Last seen in July and he was doing well.   I saw him back in September - he was having recurrent angina - referred on for repeat cath - got 3 DES to the LCX due to new critical stenosis of the distal LCX - his grafts are open. Recommended to have life long DAPT.   Last seen about 6 weeks ago - got him on PCSK-9 therapy - he was otherwise doing well.   Comes in today. Here alone. Going to the lab today. Feels good. Quite jovial today. On Praulent - tolerating well.  No chest pain. Not short of breath. He says he is "getting back on track" with taking care of himself.    Comes in today. Here with   Past Medical History:  Diagnosis Date  . Benign prostatic hypertrophy   . CAD (coronary artery disease)    a. DES to distal RCA in 09/2004; DES to Ramus Intermediate 10/2009 following abnormal stress test b. 9/27 PCI with DES x3 to LCrx  . Chronic bronchitis ([place])    "in the spring"  . Esophageal stricture   . Exertional angina ([place]) 10/31/2015  . Fibromyalgia   . Gastric polyp   . GERD (gastroesophageal reflux disease)   . Hiatal hernia   . High cholesterol   . History of gout   . Hyperlipidemia   . Hypertension   . Obesity   . Pneumonia    hx of    Past Surgical History:  Procedure Laterality Date  . CARDIAC CATHETERIZATION N/A 03/29/2015   Procedure: Left Heart Cath and Coronary Angiography;  Surgeon: Sherren Mocha, MD;  Location: [facility];  Service: Cardiovascular;  Laterality: N/A;  . CARDIAC CATHETERIZATION N/A 10/31/2015   Procedure: Left Heart Cath and Cors/Grafts Angiography;  Surgeon: Sherren Mocha, MD;  Location: [facility];  Service: Cardiovascular;  Laterality: N/A;  . CARDIAC CATHETERIZATION N/A 10/31/2015   Procedure: Coronary Stent Intervention;  Surgeon: Sherren Mocha, MD;  Location: [facility];  Service: Cardiovascular;  Laterality: N/A;  . CHOLECYSTECTOMY N/A 07/07/2017   Procedure: LAPAROSCOPIC CHOLECYSTECTOMY WITH INTRAOPERATIVE CHOLANGIOGRAM;  Surgeon: Excell Seltzer, MD;  [facility];  Service: General;  Laterality: N/A;  . COLONOSCOPY W/ POLYPECTOMY    . CORONARY ANGIOPLASTY WITH STENT PLACEMENT  09/2004-10/2009   DES to distal RCA in 09/2004; DES to Ramus Intermediate 10/2009 following abnormal stress test  . CORONARY ARTERY BYPASS GRAFT N/A 04/06/2015   Procedure: CORONARY ARTERY BYPASS GRAFTING (CABG) x three,  using bilateral mammaries, and right leg greater saphenous vein harvested  endoscopically;  Surgeon: Gaye Pollack, MD;  Location: [facility];  Service: Open Heart Surgery;  Laterality: N/A;  . ESOPHAGOGASTRODUODENOSCOPY (EGD) WITH ESOPHAGEAL DILATION    . SHOULDER ARTHROSCOPY Right 2015   scar tissue  . TEE WITHOUT CARDIOVERSION N/A 04/06/2015   Procedure: TRANSESOPHAGEAL ECHOCARDIOGRAM (TEE);  Surgeon: Gaye Pollack, MD;  Location: [facility];  Service: Open Heart Surgery;  Laterality: N/A;  . ULTRASOUND GUIDANCE FOR VASCULAR ACCESS  03/29/2015   Procedure: Ultrasound Guidance For Vascular Access;  Surgeon: Sherren Mocha, MD;  Location: [facility];  Service: Cardiovascular;;  . UMBILICAL HERNIA REPAIR N/A 07/07/2017   Procedure: HERNIA REPAIR UMBILICAL ADULT;  Surgeon: Excell Seltzer, MD;  [facility];  Service: General;  Laterality: N/A;     Medications: No outpatient medications have been marked as taking for the 07/15/17 encounter (Appointment) with Burtis Junes, NP.     Allergies: No Known Allergies  Social History: The patient  reports that he has never smoked. He has never used smokeless tobacco. He reports that he drinks about 3.0 oz of alcohol per week. He reports that he does not use drugs.   Family History: The patient's ***family history includes COPD in his mother; Diabetes type II in his sister; Heart attack in his father; Lupus in his sister.   Review of Systems: Please see the history of present illness.   Otherwise, the review of systems is positive for {NONE DEFAULTED:18576::"none"}.   All other systems are reviewed and negative.   Physical Exam: VS:  There were no vitals taken for this visit. Marland Kitchen  BMI There is no height or weight on file to calculate BMI.  Wt Readings from Last 3 Encounters:  07/07/17 250 lb (113.4 kg)  07/01/17 250 lb 6.4 oz (113.6 kg)  05/27/17 251 lb (113.9 kg)    General: Pleasant. Well developed, well nourished and in no acute distress.   HEENT: Normal.  Neck: Supple, no JVD, carotid bruits, or masses  noted.  Cardiac: ***Regular rate and rhythm. No murmurs, rubs, or gallops. No edema.  Respiratory:  Lungs are clear to auscultation bilaterally with normal work of breathing.  GI: Soft and nontender.  MS: No deformity or atrophy. Gait and ROM intact.  Skin: Warm and dry. Color is normal.  Neuro:  Strength and sensation are intact and no gross focal deficits noted.  Psych: Alert, appropriate and with normal affect.   LABORATORY DATA:  EKG:  EKG {ACTION; IS/IS FTD:32202542} ordered today. This demonstrates ***.  Lab Results  Component Value Date   WBC 7.9 07/01/2017   HGB 16.0 07/01/2017   HCT 45.6 07/01/2017   PLT 300 07/01/2017   GLUCOSE 101 (H) 07/01/2017   CHOL 168 02/11/2017   TRIG 225 (H) 02/11/2017   HDL  36 (L) 02/11/2017   LDLDIRECT 68.2 10/25/2010   LDLCALC 87 02/11/2017   ALT 13 05/28/2017   AST 15 05/28/2017   NA 142 07/01/2017   K 4.0 07/01/2017   CL 105 07/01/2017   CREATININE 0.88 07/01/2017   BUN 13 07/01/2017   CO2 25 07/01/2017   TSH 1.81 07/17/2014   PSA 1.03 08/29/2009   INR 1.1 10/29/2015   HGBA1C 5.9 (H) 04/04/2015     BNP (last 3 results) No results for input(s): BNP in the last 8760 hours.  ProBNP (last 3 results) No results for input(s): PROBNP in the last 8760 hours.   Other Studies Reviewed Today:   Assessment/Plan: Coronary Stent Intervention  Left Heart Cath and Cors/Grafts Angiography 10/2015  Conclusion     Prox LAD lesion, 90 %stenosed.  1. Severe native three-vessel coronary artery disease with severe distal left main stenosis, severe ostial LAD stenosis, severe diffuse left circumflex stenosis, and severe right PDA stenosis  2. Status post CABG with continued patency of the LIMA to LAD, free RIMA to ramus intermedius, and SVG to right PDA  3. 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     Assessment/Plan: 1. CAD with CABG x 3 back in March of 2017 - recurrent angina which has led to repeat cardiac cath and PCI with DES x 3 to the LCX. Committed to life long DAPT - he is doing well clinically.   2. HTN - remains off HCTZ. May need to add back. He wants to still see what kind of progress he can make over the next few months. He is to continue to monitor BP at home.   3. HLD - now on PCSK-9 - lab today.   4. Obesity - this has been discussed in depth at past visits  5. Prior episode of abdominal pain with past elevated LFTs - recent check by PCP were normal.             Current medicines are reviewed with the patient today.  The patient does not have concerns regarding medicines other than what has been noted above.  The following changes have been made:  See above.  Labs/ tests ordered today include:   No orders of the defined types were placed in this encounter.    Disposition:   FU with *** in {gen number 8-85:027741} {Days to years:10300}.   Patient is agreeable to this plan and will call if any problems develop in the interim.   SignedTruitt Merle, NP  07/15/2017 7:37 AM  [facility] [address] [place], [place]  [address] Phone: [phone] Fax: [phone]

## 2017-07-16 ENCOUNTER — Encounter: Payer: Self-pay | Admitting: Nurse Practitioner

## 2017-07-22 ENCOUNTER — Ambulatory Visit: Payer: BLUE CROSS/BLUE SHIELD | Admitting: Neurology

## 2017-09-28 ENCOUNTER — Other Ambulatory Visit: Payer: Self-pay | Admitting: Nurse Practitioner

## 2017-10-20 NOTE — Progress Notes (Signed)
CARDIOLOGY OFFICE NOTE  Date:  10/21/2017    Leonard Edwards Date of Birth: 08/20/1955 Medical Record #[id number]  PCP:  Marton Redwood, MD  Cardiologist:  Jerel Shepherd   Chief Complaint  Patient presents with  . Coronary Artery Disease    History of Present Illness: Leonard Edwards is a 62 y.o. male who presents today for a follow up visit. Seen for Dr. Burt Knack.   He has a history of CAD, HLD and hypertension. He initially presented several years ago with acute coronary syndrome and underwent PCI to right coronary artery in 2006. He developed recurrent anginal symptoms and underwent stenting of the ramus intermedius branch in 2011. He ultimately was referred for CABG in 2017 - he had LIMA to LAD, free RIMA to ramus and SVG to PD in March of 2017. He had recurrent angina in September of 2017 - ended up getting 3 DES to the LCX due to new critical stenosis of the distal LCX - his grafts are open. Recommended to have life long DAPT. He has had long standing issues with trying to adhere to CV risk factor modification.   He was last seen by Dr. Burt Knack in December of 2018 - doing well.   Comes in today. Here alone. He has had issues with his gallbladder - ended up having surgery in June. He did well. No more Gi issues.  Feels much better now. He continues to struggle with staying on track with CV risk factor modification. No chest pain. BP much better at home. Tolerating his medicines. No recent labs - not fasting today. His aunt just died - she was almost 44 years old.   Past Medical History:  Diagnosis Date  . Benign prostatic hypertrophy   . CAD (coronary artery disease)    a. DES to distal RCA in 09/2004; DES to Ramus Intermediate 10/2009 following abnormal stress test b. 9/27 PCI with DES x3 to LCrx  . Chronic bronchitis ([place])    "in the spring"  . Esophageal stricture   . Exertional angina ([place]) 10/31/2015  . Fibromyalgia   . Gastric polyp   . GERD (gastroesophageal reflux  disease)   . Hiatal hernia   . High cholesterol   . History of gout   . Hyperlipidemia   . Hypertension   . Obesity   . Pneumonia    hx of    Past Surgical History:  Procedure Laterality Date  . CARDIAC CATHETERIZATION N/A 03/29/2015   Procedure: Left Heart Cath and Coronary Angiography;  Surgeon: Sherren Mocha, MD;  Location: [facility];  Service: Cardiovascular;  Laterality: N/A;  . CARDIAC CATHETERIZATION N/A 10/31/2015   Procedure: Left Heart Cath and Cors/Grafts Angiography;  Surgeon: Sherren Mocha, MD;  Location: [facility];  Service: Cardiovascular;  Laterality: N/A;  . CARDIAC CATHETERIZATION N/A 10/31/2015   Procedure: Coronary Stent Intervention;  Surgeon: Sherren Mocha, MD;  Location: [facility];  Service: Cardiovascular;  Laterality: N/A;  . CHOLECYSTECTOMY N/A 07/07/2017   Procedure: LAPAROSCOPIC CHOLECYSTECTOMY WITH INTRAOPERATIVE CHOLANGIOGRAM;  Surgeon: Excell Seltzer, MD;  [facility];  Service: General;  Laterality: N/A;  . COLONOSCOPY W/ POLYPECTOMY    . CORONARY ANGIOPLASTY WITH STENT PLACEMENT  09/2004-10/2009   DES to distal RCA in 09/2004; DES to Ramus Intermediate 10/2009 following abnormal stress test  . CORONARY ARTERY BYPASS GRAFT N/A 04/06/2015   Procedure: CORONARY ARTERY BYPASS GRAFTING (CABG) x three,  using bilateral mammaries, and right leg  greater saphenous vein harvested endoscopically;  Surgeon: Gaye Pollack, MD;  Location: [facility];  Service: Open Heart Surgery;  Laterality: N/A;  . ESOPHAGOGASTRODUODENOSCOPY (EGD) WITH ESOPHAGEAL DILATION    . SHOULDER ARTHROSCOPY Right 2015   scar tissue  . TEE WITHOUT CARDIOVERSION N/A 04/06/2015   Procedure: TRANSESOPHAGEAL ECHOCARDIOGRAM (TEE);  Surgeon: Gaye Pollack, MD;  Location: [facility];  Service: Open Heart Surgery;  Laterality: N/A;  . ULTRASOUND GUIDANCE FOR VASCULAR ACCESS  03/29/2015   Procedure: Ultrasound Guidance For Vascular Access;  Surgeon: Sherren Mocha, MD;  Location: [facility];  Service: Cardiovascular;;  . UMBILICAL HERNIA REPAIR N/A 07/07/2017   Procedure: HERNIA REPAIR UMBILICAL ADULT;  Surgeon: Excell Seltzer, MD;  [facility];  Service: General;  Laterality: N/A;     Medications: Current Meds  Medication Sig  . acetaminophen (TYLENOL) 500 MG tablet Take 1 tablet (500 mg total) by mouth every 6 (six) hours as needed for moderate pain.  . Alirocumab (PRALUENT) 75 MG/ML SOPN Inject 1 pen into the skin every 14 (fourteen) days.  Marland Kitchen amLODipine (NORVASC) 10 MG tablet TAKE 1 TABLET(10 MG) BY MOUTH DAILY  . aspirin 81 MG chewable tablet Chew 1 tablet (81 mg total) by mouth daily.  Marland Kitchen CIALIS 20 MG tablet TAKE ONE TABLET DAILY AS NEEDED FOR     ERECTILE DYSFUNCTION  . clopidogrel (PLAVIX) 75 MG tablet TAKE 1 TABLET BY MOUTH DAILY WITH BREAKFAST  . Coenzyme Q10 (CO Q 10) 100 MG CAPS Take 100 mg by mouth daily.    . colchicine 0.6 MG tablet Take 0.6 mg by mouth daily.  . diphenhydramine-acetaminophen (TYLENOL PM) 25-500 MG TABS tablet Take 0.5 tablets by mouth at bedtime as needed (sleep).  . fluticasone (FLONASE) 50 MCG/ACT nasal spray Place 2 sprays into both nostrils daily as needed for allergies or rhinitis.  . hydrochlorothiazide (HYDRODIURIL) 12.5 MG tablet Take 25 mg by mouth daily.   Marland Kitchen levocetirizine (XYZAL) 5 MG tablet Take 5 mg by mouth daily as needed for allergies.  Marland Kitchen lisinopril (PRINIVIL,ZESTRIL) 10 MG tablet TAKE 1 TABLET BY MOUTH DAILY  . metoprolol succinate (TOPROL-XL) 50 MG 24 hr tablet TAKE 1 TABLET BY MOUTH TWICE DAILY (Patient taking differently: Take 50mg  by mouth once daily)  . montelukast (SINGULAIR) 10 MG tablet Take 10 mg by mouth at bedtime.  . nitroGLYCERIN (NITROSTAT) 0.4 MG SL tablet Place 1 tablet (0.4 mg total) under the tongue every 5 (five) minutes as needed for chest pain.  . pantoprazole (PROTONIX) 40 MG tablet Take 1 tablet (40 mg total) by mouth daily.  . [DISCONTINUED] oxyCODONE (OXY IR/ROXICODONE) 5 MG  immediate release tablet Take 1 tablet (5 mg total) by mouth every 6 (six) hours as needed for moderate pain, severe pain or breakthrough pain.     Allergies: No Known Allergies  Social History: The patient  reports that he has never smoked. He has never used smokeless tobacco. He reports that he drinks about 5.0 standard drinks of alcohol per week. He reports that he does not use drugs.   Family History: The patient's family history includes COPD in his mother; Diabetes type II in his sister; Heart attack in his father; Lupus in his sister.   Review of Systems: Please see the history of present illness.   Otherwise, the review of systems is positive for none.   All other systems are reviewed and negative.   Physical Exam: VS:  BP (!) 180/110 (BP Location: Left Arm, Patient  Position: Sitting, Cuff Size: Normal)   Pulse 67   Ht 5\' 10"  (1.778 m)   Wt 252 lb 1.9 oz (114.4 kg)   SpO2 97% Comment: at rest  BMI 36.18 kg/m  .  BMI Body mass index is 36.18 kg/m.  Wt Readings from Last 3 Encounters:  10/21/17 252 lb 1.9 oz (114.4 kg)  07/07/17 250 lb (113.4 kg)  07/01/17 250 lb 6.4 oz (113.6 kg)    General: Pleasant. Well developed, well nourished and in no acute distress. His weight is down from 257 when he was last seen here.  He remains obese.  HEENT: Normal.  Neck: Supple, no JVD, carotid bruits, or masses noted.  Cardiac: Regular rate and rhythm. No murmurs, rubs, or gallops. No edema.  Respiratory:  Lungs are clear to auscultation bilaterally with normal work of breathing.  GI: Soft and nontender.  MS: No deformity or atrophy. Gait and ROM intact.  Skin: Warm and dry. Color is normal.  Neuro:  Strength and sensation are intact and no gross focal deficits noted.  Psych: Alert, appropriate and with normal affect.   LABORATORY DATA:  EKG:  EKG is not ordered today.  Lab Results  Component Value Date   WBC 7.9 07/01/2017   HGB 16.0 07/01/2017   HCT 45.6 07/01/2017   PLT  300 07/01/2017   GLUCOSE 101 (H) 07/01/2017   CHOL 168 02/11/2017   TRIG 225 (H) 02/11/2017   HDL 36 (L) 02/11/2017   LDLDIRECT 68.2 10/25/2010   LDLCALC 87 02/11/2017   ALT 13 05/28/2017   AST 15 05/28/2017   NA 142 07/01/2017   K 4.0 07/01/2017   CL 105 07/01/2017   CREATININE 0.88 07/01/2017   BUN 13 07/01/2017   CO2 25 07/01/2017   TSH 1.81 07/17/2014   PSA 1.03 08/29/2009   INR 1.1 10/29/2015   HGBA1C 5.9 (H) 04/04/2015       BNP (last 3 results) No results for input(s): BNP in the last 8760 hours.  ProBNP (last 3 results) No results for input(s): PROBNP in the last 8760 hours.   Other Studies Reviewed Today:  Cardiac Cath 10-31-2015: Conclusion     Prox LAD lesion, 90 %stenosed.  1. Severe native three-vessel coronary artery disease with severe distal left main stenosis, severe ostial LAD stenosis, severe diffuse left circumflex stenosis, and severe right PDA stenosis  2. Status post CABG with continued patency of the LIMA to LAD, free RIMA to ramus intermedius, and SVG to right PDA  3. 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    ASSESSMENT AND PLAN:  1.  CAD - he has had multiple interventions - subsequent CABG x 3 in 04/2015 and then PCI from 10/2015 to the LCX due to recurrent angina. CV risk factor modification again encouraged.   2. HTN: His BP is much better by me - down to 135/78. He did not take his medicines today. He reports better control at home. I have asked him to continue to monitor.   3. Hyperlipidemia: he remains on PCKS9 - he needs labs - he is not fasting today.   4. Recent cholecystectomy  Current medicines are reviewed with the patient today.  The patient does not have concerns regarding medicines other than what has  been noted above.  The following changes have been made:  See above.  Labs/  tests ordered today include:    Orders Placed This Encounter  Procedures  . Basic metabolic panel  . CBC  . Lipid panel  . Hepatic function panel     Disposition:   FU with me in 6 months.   Patient is agreeable to this plan and will call if any problems develop in the interim.   SignedTruitt Merle, NP  10/21/2017 4:26 PM  [facility] [address] [place], [place]  [address] Phone: [phone] Fax: [phone]

## 2017-10-21 ENCOUNTER — Ambulatory Visit (INDEPENDENT_AMBULATORY_CARE_PROVIDER_SITE_OTHER): Payer: BLUE CROSS/BLUE SHIELD | Admitting: Nurse Practitioner

## 2017-10-21 ENCOUNTER — Encounter: Payer: Self-pay | Admitting: Nurse Practitioner

## 2017-10-21 VITALS — BP 180/110 | HR 67 | Ht 70.0 in | Wt 252.1 lb

## 2017-10-21 DIAGNOSIS — E782 Mixed hyperlipidemia: Secondary | ICD-10-CM | POA: Diagnosis not present

## 2017-10-21 DIAGNOSIS — I2510 Atherosclerotic heart disease of native coronary artery without angina pectoris: Secondary | ICD-10-CM

## 2017-10-21 DIAGNOSIS — I10 Essential (primary) hypertension: Secondary | ICD-10-CM

## 2017-10-21 DIAGNOSIS — Z951 Presence of aortocoronary bypass graft: Secondary | ICD-10-CM

## 2017-10-21 DIAGNOSIS — E785 Hyperlipidemia, unspecified: Secondary | ICD-10-CM | POA: Diagnosis not present

## 2017-10-21 NOTE — Patient Instructions (Signed)
We will be checking the following labs today - NONE  Fasting labs in the AM   Medication Instructions:    Continue with your current medicines.     Testing/Procedures To Be Arranged:  N/A  Follow-Up:   See me in 6 months    Other Special Instructions:   N/A    If you need a refill on your cardiac medications before your next appointment, please call your pharmacy.   Call the [place] office at [phone] if you have any questions, problems or concerns.

## 2017-10-22 ENCOUNTER — Other Ambulatory Visit: Payer: BLUE CROSS/BLUE SHIELD | Admitting: *Deleted

## 2017-10-22 DIAGNOSIS — I10 Essential (primary) hypertension: Secondary | ICD-10-CM | POA: Diagnosis not present

## 2017-10-22 DIAGNOSIS — I2510 Atherosclerotic heart disease of native coronary artery without angina pectoris: Secondary | ICD-10-CM | POA: Diagnosis not present

## 2017-10-22 DIAGNOSIS — E785 Hyperlipidemia, unspecified: Secondary | ICD-10-CM | POA: Diagnosis not present

## 2017-10-22 DIAGNOSIS — E782 Mixed hyperlipidemia: Secondary | ICD-10-CM | POA: Diagnosis not present

## 2017-10-22 DIAGNOSIS — Z951 Presence of aortocoronary bypass graft: Secondary | ICD-10-CM

## 2017-10-23 LAB — LIPID PANEL
Chol/HDL Ratio: 3.9 ratio (ref 0.0–5.0)
Cholesterol, Total: 146 mg/dL (ref 100–199)
HDL: 37 mg/dL — ABNORMAL LOW (ref >39)
LDL Calculated: 81 mg/dL (ref 0–99)
Triglycerides: 140 mg/dL (ref 0–149)
VLDL Cholesterol Cal: 28 mg/dL (ref 5–40)

## 2017-10-23 LAB — BASIC METABOLIC PANEL
BUN/Creatinine Ratio: 17 (ref 10–24)
BUN: 13 mg/dL (ref 8–27)
CO2: 23 mmol/L (ref 20–29)
Calcium: 9.6 mg/dL (ref 8.6–10.2)
Chloride: 103 mmol/L (ref 96–106)
Creatinine, Ser: 0.76 mg/dL (ref 0.76–1.27)
GFR calc Af Amer: 114 mL/min/{1.73_m2} (ref >59)
GFR calc non Af Amer: 98 mL/min/{1.73_m2} (ref >59)
Glucose: 92 mg/dL (ref 65–99)
Potassium: 4.2 mmol/L (ref 3.5–5.2)
Sodium: 142 mmol/L (ref 134–144)

## 2017-10-23 LAB — CBC
Hematocrit: 46.1 % (ref 37.5–51.0)
Hemoglobin: 16.8 g/dL (ref 13.0–17.7)
MCH: 32.3 pg (ref 26.6–33.0)
MCHC: 36.4 g/dL — ABNORMAL HIGH (ref 31.5–35.7)
MCV: 89 fL (ref 79–97)
Platelets: 324 10*3/uL (ref 150–450)
RBC: 5.2 x10E6/uL (ref 4.14–5.80)
RDW: 13.8 % (ref 12.3–15.4)
WBC: 8.1 10*3/uL (ref 3.4–10.8)

## 2017-10-23 LAB — HEPATIC FUNCTION PANEL
ALT: 32 IU/L (ref 0–44)
AST: 21 IU/L (ref 0–40)
Albumin: 4.3 g/dL (ref 3.6–4.8)
Alkaline Phosphatase: 81 IU/L (ref 39–117)
Bilirubin Total: 1 mg/dL (ref 0.0–1.2)
Bilirubin, Direct: 0.27 mg/dL (ref 0.00–0.40)
Total Protein: 7.1 g/dL (ref 6.0–8.5)

## 2017-11-03 ENCOUNTER — Telehealth: Payer: Self-pay | Admitting: Nurse Practitioner

## 2017-11-03 ENCOUNTER — Other Ambulatory Visit: Payer: Self-pay | Admitting: *Deleted

## 2017-11-03 ENCOUNTER — Other Ambulatory Visit: Payer: BLUE CROSS/BLUE SHIELD

## 2017-11-03 DIAGNOSIS — Z5181 Encounter for therapeutic drug level monitoring: Secondary | ICD-10-CM

## 2017-11-03 DIAGNOSIS — I10 Essential (primary) hypertension: Secondary | ICD-10-CM

## 2017-11-03 DIAGNOSIS — Z5189 Encounter for other specified aftercare: Secondary | ICD-10-CM

## 2017-11-03 MED ORDER — LISINOPRIL 20 MG PO TABS: 20.0000 mg | ORAL_TABLET | Freq: Every day | ORAL | 1 refills | Status: DC

## 2017-11-03 MED ORDER — HYDROCHLOROTHIAZIDE 25 MG PO TABS: 25.0000 mg | ORAL_TABLET | Freq: Every day | ORAL | 1 refills | Status: DC

## 2017-11-03 NOTE — Telephone Encounter (Signed)
Pt called in to verify doses of medication's.  Medication verified as stated in [place] previous phone note.  Pt is agreeable to treatment plan.  Will increase lisinopril one tablet by mouth (20 mg ) daily, and increase HCTZ one tablet by mouth (25 mg ) daily.  Sent in today to pt's requested pharmacy.  Will come back on Oct 9 for bmet, appt made , orders in and linked. Pt will not take any decongestants and will continue to monitor bp.

## 2017-11-03 NOTE — Telephone Encounter (Signed)
Pt c/o BP issue: STAT if pt c/o blurred vision, one-sided weakness or slurred speech  1. What are your last 5 BP readings? 155/85 TO 175/97  2. Are you having any other symptoms (ex. Dizziness, headache, blurred vision, passed out)? Blurred vision and light headache  3. What is your BP issue? Wants to talk to Servando Snare about his BP and what can be done

## 2017-11-03 NOTE — Telephone Encounter (Signed)
S/w pt bp has been elevated and pt has been lightheaded, denies blurry vision.  Pt does have a lot of sinus problems and is taking an antibiotic.  Pt did get a new cuff recently, Omron, arm cuff.  Pt feels terrible.  About two weeks ago under right breast bone near where pt has had recent surgery pt is experiencing soreness, thinks its muscular,  but stated this could be coming from the remote pt has slept on but its been about two weeks and still sore.  Pt stated did walk last night and would like to walk today at lunch a couple of miles.   Stated to pt  Not to walk today till further advisement.  bp readings are as follows:  2 nights ago 144/85  Earlier that day 156/90 Yesterday 8:00 am 163/90 12:00 pm 150/92  8:13 pm  167/97  11:42 pm  149/80 This am 8:48 am 157/94  Right now  155/101.  Will send to Cecille Rubin to advise.

## 2017-11-03 NOTE — Telephone Encounter (Signed)
Would advise - no decongestants.   Verify taking 12.5mg  of HCTZ and 10 mg of Lisinopril.  If so, increase to 25 mg of HCTZ and 20 mg of Lisinopril. BMET in one week. Continue to monitor BP.

## 2017-11-11 ENCOUNTER — Other Ambulatory Visit: Payer: BLUE CROSS/BLUE SHIELD | Admitting: *Deleted

## 2017-11-11 DIAGNOSIS — Z5189 Encounter for other specified aftercare: Secondary | ICD-10-CM | POA: Diagnosis not present

## 2017-11-11 DIAGNOSIS — I10 Essential (primary) hypertension: Secondary | ICD-10-CM

## 2017-11-11 DIAGNOSIS — Z5181 Encounter for therapeutic drug level monitoring: Secondary | ICD-10-CM

## 2017-11-11 LAB — BASIC METABOLIC PANEL
BUN/Creatinine Ratio: 18 (ref 10–24)
BUN: 14 mg/dL (ref 8–27)
CO2: 24 mmol/L (ref 20–29)
Calcium: 9.5 mg/dL (ref 8.6–10.2)
Chloride: 100 mmol/L (ref 96–106)
Creatinine, Ser: 0.79 mg/dL (ref 0.76–1.27)
GFR calc Af Amer: 112 mL/min/{1.73_m2} (ref >59)
GFR calc non Af Amer: 97 mL/min/{1.73_m2} (ref >59)
Glucose: 103 mg/dL — ABNORMAL HIGH (ref 65–99)
Potassium: 3.7 mmol/L (ref 3.5–5.2)
Sodium: 141 mmol/L (ref 134–144)

## 2017-11-17 ENCOUNTER — Telehealth: Payer: Self-pay | Admitting: Cardiovascular Disease

## 2017-11-17 MED ORDER — ALIROCUMAB 75 MG/ML ~~LOC~~ SOPN: 1.0000 "pen " | PEN_INJECTOR | SUBCUTANEOUS | 11 refills | Status: DC

## 2017-11-17 NOTE — Telephone Encounter (Signed)
New message   Pt c/o medication issue:  1. Name of Medication: Praluent   2. How are you currently taking this medication (dosage and times per day)? 75 mg per ml  3. Are you having a reaction (difficulty breathing--STAT)? No   4. What is your medication issue?[facility] needs a new prescription. The old prescription expired.

## 2017-11-17 NOTE — Telephone Encounter (Signed)
Refill sent in

## 2017-11-24 DIAGNOSIS — M5416 Radiculopathy, lumbar region: Secondary | ICD-10-CM | POA: Diagnosis not present

## 2017-12-01 ENCOUNTER — Other Ambulatory Visit: Payer: Self-pay | Admitting: *Deleted

## 2017-12-01 ENCOUNTER — Telehealth: Payer: Self-pay | Admitting: Nurse Practitioner

## 2017-12-01 MED ORDER — LISINOPRIL 20 MG PO TABS: 20.0000 mg | ORAL_TABLET | Freq: Two times a day (BID) | ORAL | 1 refills | Status: DC

## 2017-12-01 NOTE — Telephone Encounter (Signed)
New Message          Patient is asking for a  Call back from [place]. concerning his blood pressure .

## 2017-12-01 NOTE — Telephone Encounter (Signed)
His pain sounds more musculoskeletal - since hurting with twisting/turning - ok to use Tylenol and Advil for just a day or so.  BP still too high - increase Lisinopril to 20 mg BID.  Plan to check BMET when I see him back.   Cecille Rubin

## 2017-12-01 NOTE — Telephone Encounter (Signed)
S/w pt stated has this feeling on right side of chest and has had this feeling for about a month, has not gone away.  Feels this when pt reaches or turns, does not want to go to PCP and wants Cecille Rubin to check it out.  BP this am 139/80, last night 149/86, 10/18  163/83,  10/18  146/80, 10/10 170/99, 10/3  167/91, 148/81.  Will send to [place] to [facility].

## 2017-12-01 NOTE — Telephone Encounter (Signed)
Pt is agreeable to treatment plan.  Will increase lisinopril (20 mg ) bid, medication list updated. Will bring cuff and readings to upcoming appt.

## 2017-12-07 NOTE — Progress Notes (Signed)
CARDIOLOGY OFFICE NOTE  Date:  12/08/2017    Leonard Edwards Date of Birth: 1955/12/13 Medical Record #[id number]  PCP:  Marton Redwood, MD  Cardiologist:  Jerel Shepherd   Chief Complaint  Patient presents with  . Coronary Artery Disease    Work in visit - seen for Dr. Burt Knack    History of Present Illness: Leonard Edwards is a 62 y.o. male who presents today for a work in visit. Seen for Dr. Burt Knack.   He has a history of CAD, HLD and hypertension. He initially presented several years ago with acute coronary syndrome and underwent PCI to right coronary artery in 2006. He developed recurrent anginal symptoms and underwent stenting of the ramus intermedius branch in 2011. He ultimately was referred for CABG in 2017 - he had LIMA to LAD, free RIMA to ramus and SVG to PD in March of 2017. He had recurrent angina in September of 2017 - ended up getting 3 DES to the LCX due to new critical stenosis of the distal LCX - his grafts are open. Recommended to have life long DAPT. He has had long standing issues with trying to adhere to CV risk factor modification.    He was last seen by Dr. Burt Knack in December of 2018 - doing well.   I then saw him in September - he had ended up having gallbladder issues and had cholecystectomy without issue. Continues to struggle with CV risk factor modification which has always been an issue for him. BP has been tracking up and we have had to adjust/increase medicines.   Comes in today. Here alone. BP still not with great control. His cuff matches up pretty good. No active chest pain but has a palpable spot that he can pinpoint on the right side of his sternal incision that hurts with twisting/turning/palpation. He is going to the gym most days - has no issues. For some reason, only taking 30 mg of ACE - he is using up his 10 mg supply. Literally takes all of his medicines in the AM. AM BP's are the highest. He feels a little swimmy headed - does not know if  it is from his medicines, his BP or his chronic sinus issues. He has had a few rounds of antibiotics - this always makes him feel better. He has been using some OTC Omeprazole - he is on chronic Plavix.    Past Medical History:  Diagnosis Date  . Benign prostatic hypertrophy   . CAD (coronary artery disease)    a. DES to distal RCA in 09/2004; DES to Ramus Intermediate 10/2009 following abnormal stress test b. 9/27 PCI with DES x3 to LCrx  . Chronic bronchitis ([place])    "in the spring"  . Esophageal stricture   . Exertional angina ([place]) 10/31/2015  . Fibromyalgia   . Gastric polyp   . GERD (gastroesophageal reflux disease)   . Hiatal hernia   . High cholesterol   . History of gout   . Hyperlipidemia   . Hypertension   . Obesity   . Pneumonia    hx of    Past Surgical History:  Procedure Laterality Date  . CARDIAC CATHETERIZATION N/A 03/29/2015   Procedure: Left Heart Cath and Coronary Angiography;  Surgeon: Sherren Mocha, MD;  Location: [facility];  Service: Cardiovascular;  Laterality: N/A;  . CARDIAC CATHETERIZATION N/A 10/31/2015   Procedure: Left Heart Cath and Cors/Grafts Angiography;  Surgeon: Sherren Mocha, MD;  Location: [facility];  Service: Cardiovascular;  Laterality: N/A;  . CARDIAC CATHETERIZATION N/A 10/31/2015   Procedure: Coronary Stent Intervention;  Surgeon: Sherren Mocha, MD;  Location: [facility];  Service: Cardiovascular;  Laterality: N/A;  . CHOLECYSTECTOMY N/A 07/07/2017   Procedure: LAPAROSCOPIC CHOLECYSTECTOMY WITH INTRAOPERATIVE CHOLANGIOGRAM;  Surgeon: Excell Seltzer, MD;  Location: [facility];  Service: General;  Laterality: N/A;  . COLONOSCOPY W/ POLYPECTOMY    . CORONARY ANGIOPLASTY WITH STENT PLACEMENT  09/2004-10/2009   DES to distal RCA in 09/2004; DES to Ramus Intermediate 10/2009 following abnormal stress test  . CORONARY ARTERY BYPASS GRAFT N/A 04/06/2015   Procedure: CORONARY ARTERY BYPASS GRAFTING (CABG) x three,  using bilateral  mammaries, and right leg greater saphenous vein harvested endoscopically;  Surgeon: Gaye Pollack, MD;  Location: [facility];  Service: Open Heart Surgery;  Laterality: N/A;  . ESOPHAGOGASTRODUODENOSCOPY (EGD) WITH ESOPHAGEAL DILATION    . SHOULDER ARTHROSCOPY Right 2015   scar tissue  . TEE WITHOUT CARDIOVERSION N/A 04/06/2015   Procedure: TRANSESOPHAGEAL ECHOCARDIOGRAM (TEE);  Surgeon: Gaye Pollack, MD;  Location: [facility];  Service: Open Heart Surgery;  Laterality: N/A;  . ULTRASOUND GUIDANCE FOR VASCULAR ACCESS  03/29/2015   Procedure: Ultrasound Guidance For Vascular Access;  Surgeon: Sherren Mocha, MD;  Location: [facility];  Service: Cardiovascular;;  . UMBILICAL HERNIA REPAIR N/A 07/07/2017   Procedure: HERNIA REPAIR UMBILICAL ADULT;  Surgeon: Excell Seltzer, MD;  Location: [facility];  Service: General;  Laterality: N/A;     Medications: Current Meds  Medication Sig  . acetaminophen (TYLENOL) 500 MG tablet Take 1 tablet (500 mg total) by mouth every 6 (six) hours as needed for moderate pain.  . Alirocumab (PRALUENT) 75 MG/ML SOPN Inject 1 pen into the skin every 14 (fourteen) days.  Marland Kitchen amLODipine (NORVASC) 10 MG tablet TAKE 1 TABLET(10 MG) BY MOUTH DAILY AT BEDTIME  . aspirin 81 MG chewable tablet Chew 1 tablet (81 mg total) by mouth daily.  Marland Kitchen CIALIS 20 MG tablet TAKE ONE TABLET DAILY AS NEEDED FOR     ERECTILE DYSFUNCTION  . clopidogrel (PLAVIX) 75 MG tablet TAKE 1 TABLET BY MOUTH DAILY WITH BREAKFAST  . Coenzyme Q10 (CO Q 10) 100 MG CAPS Take 100 mg by mouth daily.    . colchicine 0.6 MG tablet Take 0.6 mg by mouth daily.  . diphenhydramine-acetaminophen (TYLENOL PM) 25-500 MG TABS tablet Take 0.5 tablets by mouth at bedtime as needed (sleep).  . fluticasone (FLONASE) 50 MCG/ACT nasal spray Place 2 sprays into both nostrils daily as needed for allergies or rhinitis.  . hydrochlorothiazide (HYDRODIURIL) 25 MG tablet Take 1 tablet (25 mg total) by mouth daily.  Marland Kitchen levocetirizine  (XYZAL) 5 MG tablet Take 5 mg by mouth daily as needed for allergies.  Marland Kitchen lisinopril (PRINIVIL,ZESTRIL) 20 MG tablet Take 20 mg by mouth 2 (two) times daily.  . metoprolol succinate (TOPROL-XL) 50 MG 24 hr tablet TAKE 1 TABLET BY MOUTH TWICE DAILY (Patient taking differently: Take 50mg  by mouth once daily)  . montelukast (SINGULAIR) 10 MG tablet Take 10 mg by mouth at bedtime.  . pantoprazole (PROTONIX) 40 MG tablet Take 1 tablet (40 mg total) by mouth daily.  . [DISCONTINUED] amLODipine (NORVASC) 10 MG tablet TAKE 1 TABLET(10 MG) BY MOUTH DAILY  . [DISCONTINUED] lisinopril (PRINIVIL,ZESTRIL) 20 MG tablet Take 1 tablet (20 mg total) by mouth 2 (two) times daily.  . [DISCONTINUED] pantoprazole (PROTONIX) 40 MG tablet Take 1 tablet (40  mg total) by mouth daily.     Allergies: No Known Allergies  Social History: The patient  reports that he has never smoked. He has never used smokeless tobacco. He reports that he drinks about 5.0 standard drinks of alcohol per week. He reports that he does not use drugs.   Family History: The patient's family history includes COPD in his mother; Diabetes type II in his sister; Heart attack in his father; Lupus in his sister.   Review of Systems: Please see the history of present illness.   Otherwise, the review of systems is positive for none.   All other systems are reviewed and negative.   Physical Exam: VS:  BP 138/80 (BP Location: Left Arm, Patient Position: Sitting, Cuff Size: Large) Comment: his cuff 136/90  Pulse 65   Ht 5\' 10"  (1.778 m)   Wt 251 lb 12.8 oz (114.2 kg)   BMI 36.13 kg/m  .  BMI Body mass index is 36.13 kg/m.  Wt Readings from Last 3 Encounters:  12/08/17 251 lb 12.8 oz (114.2 kg)  10/21/17 252 lb 1.9 oz (114.4 kg)  07/07/17 250 lb (113.4 kg)    General: Pleasant. Well developed, well nourished and in no acute distress.   HEENT: Normal.  Neck: Supple, no JVD, carotid bruits, or masses noted.  Cardiac: Regular rate and rhythm.  No murmurs, rubs, or gallops. No edema. Palpable chest wall pain.  Respiratory:  Lungs are clear to auscultation bilaterally with normal work of breathing.  GI: Soft and nontender.  MS: No deformity or atrophy. Gait and ROM intact.  Skin: Warm and dry. Color is normal.  Neuro:  Strength and sensation are intact and no gross focal deficits noted.  Psych: Alert, appropriate and with normal affect.   LABORATORY DATA:  EKG:  EKG is not ordered today.   Lab Results  Component Value Date   WBC 8.1 10/22/2017   HGB 16.8 10/22/2017   HCT 46.1 10/22/2017   PLT 324 10/22/2017   GLUCOSE 103 (H) 11/11/2017   CHOL 146 10/22/2017   TRIG 140 10/22/2017   HDL 37 (L) 10/22/2017   LDLDIRECT 68.2 10/25/2010   LDLCALC 81 10/22/2017   ALT 32 10/22/2017   AST 21 10/22/2017   NA 141 11/11/2017   K 3.7 11/11/2017   CL 100 11/11/2017   CREATININE 0.79 11/11/2017   BUN 14 11/11/2017   CO2 24 11/11/2017   TSH 1.81 07/17/2014   PSA 1.03 08/29/2009   INR 1.1 10/29/2015   HGBA1C 5.9 (H) 04/04/2015     BNP (last 3 results) No results for input(s): BNP in the last 8760 hours.  ProBNP (last 3 results) No results for input(s): PROBNP in the last 8760 hours.   Other Studies Reviewed Today:  Cardiac Cath 10-31-2015: Conclusion     Prox LAD lesion, 90 %stenosed.  1. Severe native three-vessel coronary artery disease with severe distal left main stenosis, severe ostial LAD stenosis, severe diffuse left circumflex stenosis, and severe right PDA stenosis  2. Status post CABG with continued patency of the LIMA to LAD, free RIMA to ramus intermedius, and SVG to right PDA  3. 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    ASSESSMENT AND PLAN:  1.HTN - not at goal upon review of  his readings - will increase the ACE as previously directed to 20 mg  BID. Changing Norvasc to QHS. His cuff matches up pretty well.   2. CAD - he has had multiple interventions - subsequent CABG x 3 in 04/2015 and then PCI from 10/2015 to the LCX due to recurrent angina. CV risk factor modification again encouraged. No active chest pain. He is using some OTC Omeprazole - would change to Protonix.   3. Hyperlipidemia: he remains on PCKS9 - labs noted.   4. Prior cholecystectomy  5. Chronic sinus/allergy issues.   Current medicines are reviewed with the patient today.  The patient does not have concerns regarding medicines other than what has been noted above.  The following changes have been made:  See above.  Labs/ tests ordered today include:   No orders of the defined types were placed in this encounter.    Disposition:   FU with me as planned. I am happy to see back as needed before March.    Patient is agreeable to this plan and will call if any problems develop in the interim.   SignedTruitt Merle, NP  12/08/2017 12:14 PM  [facility] [address] [place], [place]  [address] Phone: [phone] Fax: [phone]

## 2017-12-08 ENCOUNTER — Ambulatory Visit (INDEPENDENT_AMBULATORY_CARE_PROVIDER_SITE_OTHER): Payer: BLUE CROSS/BLUE SHIELD | Admitting: Nurse Practitioner

## 2017-12-08 ENCOUNTER — Encounter: Payer: Self-pay | Admitting: Nurse Practitioner

## 2017-12-08 VITALS — BP 138/80 | HR 65 | Ht 70.0 in | Wt 251.8 lb

## 2017-12-08 DIAGNOSIS — I10 Essential (primary) hypertension: Secondary | ICD-10-CM

## 2017-12-08 DIAGNOSIS — I2510 Atherosclerotic heart disease of native coronary artery without angina pectoris: Secondary | ICD-10-CM

## 2017-12-08 DIAGNOSIS — E785 Hyperlipidemia, unspecified: Secondary | ICD-10-CM | POA: Diagnosis not present

## 2017-12-08 MED ORDER — PANTOPRAZOLE SODIUM 40 MG PO TBEC: 40.0000 mg | DELAYED_RELEASE_TABLET | Freq: Every day | ORAL | 3 refills | Status: DC

## 2017-12-08 MED ORDER — AMLODIPINE BESYLATE 10 MG PO TABS: ORAL_TABLET | ORAL | 3 refills | Status: DC

## 2017-12-08 NOTE — Patient Instructions (Addendum)
We will be checking the following labs today - NONE   Medication Instructions:    Continue with your current medicines. BUT  Start taking the Amlodipine to bedtime  Split dose the Lisinopril to 20 mg twice a day (total of 40 mg a day)  I have sent in RX for Prontonix 40 mg to take daily   DO NOT TAKE OMEPRAZOLE   If you need a refill on your cardiac medications before your next appointment, please call your pharmacy.     Testing/Procedures To Be Arranged:  N/A  Follow-Up:   See me back as planned in March.      At [facility], you and your health needs are our priority.  As part of our continuing mission to provide you with exceptional heart care, we have created designated Provider Care Teams.  These Care Teams include your primary Cardiologist (physician) and Advanced Practice Providers (APPs -  Physician Assistants and Nurse Practitioners) who all work together to provide you with the care you need, when you need it.  Special Instructions:  . None  Call the [facility] office at [phone] if you have any questions, problems or concerns.

## 2017-12-16 ENCOUNTER — Other Ambulatory Visit: Payer: Self-pay

## 2017-12-16 MED ORDER — INDOMETHACIN 50 MG PO CAPS: 50.0000 mg | ORAL_CAPSULE | Freq: Three times a day (TID) | ORAL | 1 refills | Status: DC | PRN

## 2017-12-26 ENCOUNTER — Other Ambulatory Visit: Payer: Self-pay | Admitting: Internal Medicine

## 2017-12-29 ENCOUNTER — Telehealth: Payer: Self-pay

## 2017-12-29 NOTE — Telephone Encounter (Signed)
-----   Message from Irene Shipper, MD sent at 12/28/2017  2:18 PM EST ----- Yes please.  Thank you ----- Message ----- From: Audrea Muscat, CMA Sent: 12/28/2017   1:37 PM EST To: Irene Shipper, MD  I received a refill request for Indomethacin; last filled 12/16/2017 tid #30 x 1.  Ok to refill?

## 2018-02-20 ENCOUNTER — Other Ambulatory Visit: Payer: Self-pay | Admitting: Cardiovascular Disease

## 2018-03-04 ENCOUNTER — Other Ambulatory Visit: Payer: Self-pay

## 2018-03-04 ENCOUNTER — Telehealth: Payer: Self-pay

## 2018-03-04 MED ORDER — AMOXICILLIN 500 MG PO CAPS: 500.0000 mg | ORAL_CAPSULE | Freq: Two times a day (BID) | ORAL | 0 refills | Status: DC

## 2018-03-04 NOTE — Telephone Encounter (Signed)
Script sent to pharmacy.

## 2018-03-04 NOTE — Telephone Encounter (Signed)
-----   Message from Irene Shipper, MD sent at 03/03/2018 10:01 PM EST ----- Regarding: amoxicillin presciption [place] B[place],  Mr. Mach has recurrent sinusitis. Please call in amoxicillin 500 mg BID x 10 days. #20. No refills to [facility] Dr. Marina Gravel  Dr. Henrene Pastor

## 2018-04-13 ENCOUNTER — Ambulatory Visit: Payer: BLUE CROSS/BLUE SHIELD | Admitting: Nurse Practitioner

## 2018-04-19 ENCOUNTER — Other Ambulatory Visit: Payer: Self-pay | Admitting: Cardiovascular Disease

## 2018-04-19 ENCOUNTER — Other Ambulatory Visit: Payer: Self-pay | Admitting: Nurse Practitioner

## 2018-04-21 ENCOUNTER — Other Ambulatory Visit: Payer: Self-pay

## 2018-04-21 ENCOUNTER — Telehealth: Payer: Self-pay | Admitting: Nurse Practitioner

## 2018-04-21 ENCOUNTER — Telehealth: Payer: Self-pay | Admitting: *Deleted

## 2018-04-21 DIAGNOSIS — R509 Fever, unspecified: Secondary | ICD-10-CM | POA: Diagnosis not present

## 2018-04-21 DIAGNOSIS — J189 Pneumonia, unspecified organism: Secondary | ICD-10-CM | POA: Diagnosis not present

## 2018-04-21 DIAGNOSIS — R05 Cough: Secondary | ICD-10-CM | POA: Diagnosis not present

## 2018-04-21 DIAGNOSIS — J09X2 Influenza due to identified novel influenza A virus with other respiratory manifestations: Secondary | ICD-10-CM | POA: Diagnosis not present

## 2018-04-21 MED ORDER — AMOXICILLIN 500 MG PO CAPS: 500.0000 mg | ORAL_CAPSULE | Freq: Two times a day (BID) | ORAL | 0 refills | Status: DC

## 2018-04-21 NOTE — Telephone Encounter (Signed)
Error

## 2018-04-21 NOTE — Telephone Encounter (Signed)
Received phone call from Leonard Edwards,   He has concerns for possible COVID-19. He has not felt well for the last 10 days. He has had allergy symptoms along with cough. He has had flu exposure. He has traveled from [place] 3 weeks ago. He is not short of breath. He has developed low grade fever of less than 100 over the past 24 hours. He is asking about testing and self quarantine. He also has a call into his PCP's office as well.   I have asked him to touch base with Leonard Edwards - his PCP - they are setting up drive thru testing. I would favor self quarantine as well. While we were talking, Leonard Edwards was calling the patient back as well and will discuss further with him.   Leonard Junes, RN, [facility] [address] [place], [place]  [address] [phone]

## 2018-04-27 ENCOUNTER — Telehealth: Payer: Self-pay | Admitting: Nurse Practitioner

## 2018-04-27 NOTE — Telephone Encounter (Signed)
Primary Cardiologist: Leonard Edwards  Patient contacted. Has had flu and pneumonia last week. [place] No symptoms to suggest any unstable cardiac conditions. Based on discussion, with current pandemic situation and his current illness, we will be postponing his OV for next week. Will reschedule for 3 months with me. If symptoms change, Leonard Edwards will be in contact with our office.   Leonard Junes, RN, [facility] [address] [place], [place]  [phone]

## 2018-04-28 ENCOUNTER — Telehealth: Payer: Self-pay | Admitting: *Deleted

## 2018-04-28 NOTE — Telephone Encounter (Signed)
LVM with appt date and time for R/S due to COVID-19.

## 2018-05-03 ENCOUNTER — Ambulatory Visit: Payer: BLUE CROSS/BLUE SHIELD | Admitting: Nurse Practitioner

## 2018-05-04 ENCOUNTER — Ambulatory Visit: Payer: BLUE CROSS/BLUE SHIELD | Admitting: Nurse Practitioner

## 2018-05-25 NOTE — Telephone Encounter (Signed)
This encounter was created in error - please disregard.

## 2018-05-28 ENCOUNTER — Telehealth: Payer: Self-pay | Admitting: Nurse Practitioner

## 2018-05-28 NOTE — Telephone Encounter (Signed)
Approvedon March 30  Effective from 04/29/2018 through 04/28/2019.

## 2018-05-28 NOTE — Telephone Encounter (Signed)
[facility] calling wanting to know if we received the prior auth for Praluent and would like a call back to let them know at [phone]. Please address

## 2018-05-31 ENCOUNTER — Telehealth: Payer: Self-pay

## 2018-05-31 NOTE — Telephone Encounter (Signed)
Your request has been approved  Effective from 04/29/2018 through 04/28/2019.

## 2018-05-31 NOTE — Telephone Encounter (Signed)
Lipid PA 

## 2018-05-31 NOTE — Telephone Encounter (Signed)
Looks like it's another of those [employer] plans that "updated"on April 1 and now we have to re-submit.  Card info still correct - patient was unaware that anything on his plan changed on April 1

## 2018-05-31 NOTE — Telephone Encounter (Signed)
Follow up    The Prior authorization that is on file is not effective under the pt's new plan. The pt has a new plan that began effective 05/05/18, so a new prior authorization is required for the medication praluent

## 2018-06-01 ENCOUNTER — Telehealth: Payer: Self-pay | Admitting: Nurse Practitioner

## 2018-06-01 MED ORDER — EVOLOCUMAB 140 MG/ML ~~LOC~~ SOAJ: 1.0000 "pen " | SUBCUTANEOUS | 11 refills | Status: DC

## 2018-06-01 NOTE — Telephone Encounter (Signed)
Will submit a new PA for Repatha since Praluent is no longer covered, Repatha approved immediately through 05/31/19. Pt is aware of medication change. Will activate $5 copay card as well.

## 2018-06-01 NOTE — Telephone Encounter (Signed)
New Message   John from [facility]  Is calling because the medication praluent is not covered with his insurance and is wondering if can Switch to [place]     Please call

## 2018-07-13 ENCOUNTER — Telehealth: Payer: Self-pay

## 2018-07-13 NOTE — Telephone Encounter (Signed)
Attempted to reach pt to obtain consent and explain virtual visit. No answer. VM full so I could not leave a message. Called all numbers pt has listed in chart

## 2018-07-19 NOTE — Telephone Encounter (Signed)
Virtual Visit Pre-Appointment Phone Call  "(Name), I am calling you today to discuss your upcoming appointment. We are currently trying to limit exposure to the virus that causes COVID-19 by seeing patients at home rather than in the office."  1. "What is the BEST phone number to call the day of the visit?" - include this in appointment notes  2. "Do you have or have access to (through a family member/friend) a smartphone with video capability that we can use for your visit?" a. If yes - list this number in appt notes as "cell" (if different from BEST phone #) and list the appointment type as a VIDEO visit in appointment notes b. If no - list the appointment type as a PHONE visit in appointment notes  3. Confirm consent - "In the setting of the current Covid19 crisis, you are scheduled for a (phone or video) visit with your provider on (Tuesday, June 16) at (2:30 pm ).  Just as we do with many in-office visits, in order for you to participate in this visit, we must obtain consent.  If you'd like, I can send this to your mychart (if signed up) or email for you to review.  Otherwise, I can obtain your verbal consent now.  All virtual visits are billed to your insurance company just like a normal visit would be.  By agreeing to a virtual visit, we'd like you to understand that the technology does not allow for your provider to perform an examination, and thus may limit your provider's ability to fully assess your condition. If your provider identifies any concerns that need to be evaluated in person, we will make arrangements to do so.  Finally, though the technology is pretty good, we cannot assure that it will always work on either your or our end, and in the setting of a video visit, we may have to convert it to a phone-only visit.  In either situation, we cannot ensure that we have a secure connection.  Are you willing to proceed?" STAFF: Did the patient verbally acknowledge consent to telehealth  visit? Document YES/NO here: YES.   4. Advise patient to be prepared - "Two hours prior to your appointment, go ahead and check your blood pressure, pulse, oxygen saturation, and your weight (if you have the equipment to check those) and write them all down. When your visit starts, your provider will ask you for this information. If you have an Apple Watch or Kardia device, please plan to have heart rate information ready on the day of your appointment. Please have a pen and paper handy nearby the day of the visit as well."  5. Give patient instructions for MyChart download to smartphone OR Doximity/Doxy.me as below if video visit (depending on what platform provider is using)  6. Inform patient they will receive a phone call 15 minutes prior to their appointment time (may be from unknown caller ID) so they should be prepared to answer    Leonard Edwards has been deemed a candidate for a follow-up tele-health visit to limit community exposure during the Covid-19 pandemic. I spoke with the patient via phone to ensure availability of phone/video source, confirm preferred email & phone number, and discuss instructions and expectations.  I reminded Leonard Edwards to be prepared with any vital sign and/or heart rhythm information that could potentially be obtained via home monitoring, at the time of his visit. I reminded Leonard Edwards to expect  a phone call prior to his visit.  Danielle Avanell Shackleton 07/19/2018 2:56 PM   INSTRUCTIONS FOR DOWNLOADING THE MYCHART APP TO SMARTPHONE  - The patient must first make sure to have activated MyChart and know their login information - If Apple, go to [employer] and type in MyChart in the search bar and download the app. If Android, ask patient to go to [employer] and type in [place] in the search bar and download the app. The app is free but as with any other app downloads, their phone may require them to verify saved payment  information or Apple/Android password.  - The patient will need to then log into the app with their MyChart username and password, and select [facility] as their healthcare provider to link the account. When it is time for your visit, go to the MyChart app, find appointments, and click Begin Video Visit. Be sure to Select Allow for your device to access the Microphone and Camera for your visit. You will then be connected, and your provider will be with you shortly.  **If they have any issues connecting, or need assistance please contact MyChart service desk [phone])**  **If using a computer, in order to ensure the best quality for their visit they will need to use either of the following Internet Browsers: [employer], or Google Chrome**  IF USING DOXIMITY or DOXY.ME - The patient will receive a link just prior to their visit by text.     FULL LENGTH CONSENT FOR TELE-HEALTH VISIT   I hereby voluntarily request, consent and authorize [place] and its employed or contracted physicians, physician assistants, nurse practitioners or other licensed health care professionals (the Practitioner), to provide me with telemedicine health care services (the "Services") as deemed necessary by the treating Practitioner. I acknowledge and consent to receive the Services by the Practitioner via telemedicine. I understand that the telemedicine visit will involve communicating with the Practitioner through live audiovisual communication technology and the disclosure of certain medical information by electronic transmission. I acknowledge that I have been given the opportunity to request an in-person assessment or other available alternative prior to the telemedicine visit and am voluntarily participating in the telemedicine visit.  I understand that I have the right to withhold or withdraw my consent to the use of telemedicine in the course of my care at any time, without affecting my right  to future care or treatment, and that the Practitioner or I may terminate the telemedicine visit at any time. I understand that I have the right to inspect all information obtained and/or recorded in the course of the telemedicine visit and may receive copies of available information for a reasonable fee.  I understand that some of the potential risks of receiving the Services via telemedicine include:  Marland Kitchen Delay or interruption in medical evaluation due to technological equipment failure or disruption; . Information transmitted may not be sufficient (e.g. poor resolution of images) to allow for appropriate medical decision making by the Practitioner; and/or  . In rare instances, security protocols could fail, causing a breach of personal health information.  Furthermore, I acknowledge that it is my responsibility to provide information about my medical history, conditions and care that is complete and accurate to the best of my ability. I acknowledge that Practitioner's advice, recommendations, and/or decision may be based on factors not within their control, such as incomplete or inaccurate data provided by me or distortions of diagnostic images or specimens that may  result from electronic transmissions. I understand that the practice of medicine is not an exact science and that Practitioner makes no warranties or guarantees regarding treatment outcomes. I acknowledge that I will receive a copy of this consent concurrently upon execution via email to the email address I last provided but may also request a printed copy by calling the office of [place] [facility].    I understand that my insurance will be billed for this visit.   I have read or had this consent read to me. . I understand the contents of this consent, which adequately explains the benefits and risks of the Services being provided via telemedicine.  . I have been provided ample opportunity to ask questions regarding this consent and the Services  and have had my questions answered to my satisfaction. . I give my informed consent for the services to be provided through the use of telemedicine in my medical care  By participating in this telemedicine visit I agree to the above.

## 2018-07-19 NOTE — Progress Notes (Signed)
Telehealth Visit     Virtual Visit via Video Note   This visit type was conducted due to national recommendations for restrictions regarding the COVID-19 Pandemic (e.g. social distancing) in an effort to limit this patient's exposure and mitigate transmission in our community.  Due to his co-morbid illnesses, this patient is at least at moderate risk for complications without adequate follow up.  This format is felt to be most appropriate for this patient at this time.  All issues noted in this document were discussed and addressed.  A limited physical exam was performed with this format.  Please refer to the patient's chart for his consent to telehealth for [facility].   Evaluation Performed:  Follow-up visit  This visit type was conducted due to national recommendations for restrictions regarding the COVID-19 Pandemic (e.g. social distancing).  This format is felt to be most appropriate for this patient at this time.  All issues noted in this document were discussed and addressed.  No physical exam was performed (except for noted visual exam findings with Video Visits).  Please refer to the patient's chart (MyChart message for video visits and phone note for telephone visits) for the patient's consent to telehealth for [facility].  Date:  07/20/2018   ID:  GEROGE Edwards, DOB 1955-03-21, MRN [id number]  Patient Location:  [facility]  Provider location:   Home  PCP:  Marton Redwood, MD  Cardiologist:  Jerel Shepherd Electrophysiologist:  None   Chief Complaint:  Follow up  History of Present Illness:    Leonard Edwards is a 63 y.o. male who presents via audio/video conferencing for a telehealth visit today.  Seen for Dr. Burt Knack.  He has a history of CAD, HLDand hypertension. He initially presented several years ago with acute coronary syndrome and underwent PCI to right coronary artery in 2006. He developed recurrent anginal symptoms and underwent stenting of the ramus  intermedius branch in 2011. He ultimately was referred for CABG in 2017 - he [place] to LAD, free RIMA to ramus and SVG to PD in March of 2017.He had recurrent angina in September of 2017 - ended up getting3 DES to the LCX due to new critical stenosis of the distal LCX - his grafts are open. Recommended to have life long DAPT.He has had long standing issues with trying to adhere to CV risk factor modification.   He was last seen by Dr. Burt Knack in December of 2018 - doing well.   I then saw him in September - he had ended up having gallbladder issues and had cholecystectomy without issue. Continues to struggle with CV risk factor modification which has always been an issue for him. BP has been tracking up and we have had to adjust/increase medicines. I last saw him in November - BP not controlled - we changed the timing of his medicines and got his ACE back to a BID dosing schedule. Also changed his PPI due to chronic Plavix. Continues with chronic sinus issues.   He called back in March - concern for COVID - he went and [place] himself at the beach - he ended up testing + for flu.   The patient does not have symptoms concerning for COVID-19 infection (fever, chills, cough, or new shortness of breath).   Seen today via Doximity video. He has consented for this visit. He is doing well. He is still at the beach - has been there since March due to [place]. He is back to once a day  dosing Lisinopril - he does not remember [place] increasing this to BID at his last in-office visit. BP still somewhat elevated. He feels good. Taking lots of long walks on the beach. No chest pain. Breathing is fine. Feels good on his medicines. Typically BP is 140 to 150/70 to 80's.  He did not get his physical but he is planning on talking with Dr. Brigitte Pulse about getting his labs scheduled here in the next few weeks. Overall, he feels like he is doing well with no real concerns.   Past Medical History:  Diagnosis Date  . Benign  prostatic hypertrophy   . CAD (coronary artery disease)    a. DES to distal RCA in 09/2004; DES to Ramus Intermediate 10/2009 following abnormal stress test b. 9/27 PCI with DES x3 to LCrx  . Chronic bronchitis ([place])    "in the spring"  . Esophageal stricture   . Exertional angina ([place]) 10/31/2015  . Fibromyalgia   . Gastric polyp   . GERD (gastroesophageal reflux disease)   . Hiatal hernia   . High cholesterol   . History of gout   . Hyperlipidemia   . Hypertension   . Obesity   . Pneumonia    hx of   Past Surgical History:  Procedure Laterality Date  . CARDIAC CATHETERIZATION N/A 03/29/2015   Procedure: Left Heart Cath and Coronary Angiography;  Surgeon: Sherren Mocha, MD;  Location: [facility];  Service: Cardiovascular;  Laterality: N/A;  . CARDIAC CATHETERIZATION N/A 10/31/2015   Procedure: Left Heart Cath and Cors/Grafts Angiography;  Surgeon: Sherren Mocha, MD;  Location: [facility];  Service: Cardiovascular;  Laterality: N/A;  . CARDIAC CATHETERIZATION N/A 10/31/2015   Procedure: Coronary Stent Intervention;  Surgeon: Sherren Mocha, MD;  Location: [facility];  Service: Cardiovascular;  Laterality: N/A;  . CHOLECYSTECTOMY N/A 07/07/2017   Procedure: LAPAROSCOPIC CHOLECYSTECTOMY WITH INTRAOPERATIVE CHOLANGIOGRAM;  Surgeon: Excell Seltzer, MD;  Location: [facility];  Service: General;  Laterality: N/A;  . COLONOSCOPY W/ POLYPECTOMY    . CORONARY ANGIOPLASTY WITH STENT PLACEMENT  09/2004-10/2009   DES to distal RCA in 09/2004; DES to Ramus Intermediate 10/2009 following abnormal stress test  . CORONARY ARTERY BYPASS GRAFT N/A 04/06/2015   Procedure: CORONARY ARTERY BYPASS GRAFTING (CABG) x three,  using bilateral mammaries, and right leg greater saphenous vein harvested endoscopically;  Surgeon: Gaye Pollack, MD;  Location: [facility];  Service: Open Heart Surgery;  Laterality: N/A;  . ESOPHAGOGASTRODUODENOSCOPY (EGD) WITH ESOPHAGEAL DILATION    . SHOULDER ARTHROSCOPY  Right 2015   scar tissue  . TEE WITHOUT CARDIOVERSION N/A 04/06/2015   Procedure: TRANSESOPHAGEAL ECHOCARDIOGRAM (TEE);  Surgeon: Gaye Pollack, MD;  Location: [facility];  Service: Open Heart Surgery;  Laterality: N/A;  . ULTRASOUND GUIDANCE FOR VASCULAR ACCESS  03/29/2015   Procedure: Ultrasound Guidance For Vascular Access;  Surgeon: Sherren Mocha, MD;  Location: [facility];  Service: Cardiovascular;;  . UMBILICAL HERNIA REPAIR N/A 07/07/2017   Procedure: HERNIA REPAIR UMBILICAL ADULT;  Surgeon: Excell Seltzer, MD;  Location: [facility];  Service: General;  Laterality: N/A;     Current Meds  Medication Sig  . amLODipine (NORVASC) 10 MG tablet TAKE 1 TABLET(10 MG) BY MOUTH DAILY AT BEDTIME  . aspirin EC 81 MG tablet Take 81 mg by mouth daily.  Marland Kitchen CIALIS 20 MG tablet TAKE ONE TABLET DAILY AS NEEDED FOR     ERECTILE DYSFUNCTION  . clopidogrel (PLAVIX) 75 MG tablet TAKE 1  TABLET BY MOUTH DAILY WITH BREAKFAST  . Coenzyme Q10 (CO Q 10) 100 MG CAPS Take 100 mg by mouth daily.    . colchicine 0.6 MG tablet Take 0.6 mg by mouth as needed (gout flare up).   . diphenhydramine-acetaminophen (TYLENOL PM) 25-500 MG TABS tablet Take 0.5 tablets by mouth at bedtime as needed (sleep).  . Evolocumab (REPATHA SURECLICK) 425 MG/ML SOAJ Inject 1 pen into the skin every 14 (fourteen) days.  . fluticasone (FLONASE) 50 MCG/ACT nasal spray Place 2 sprays into both nostrils daily as needed for allergies or rhinitis.  . hydrochlorothiazide (HYDRODIURIL) 25 MG tablet TAKE 1 TABLET(25 MG) BY MOUTH DAILY  . indomethacin (INDOCIN) 50 MG capsule TAKE 1 CAPSULE BY MOUTH THREE TIMES DAILY AS NEEDED (Patient taking differently: Take 50 mg by mouth as needed for mild pain (gout flare up). )  . levocetirizine (XYZAL) 5 MG tablet Take 5 mg by mouth daily as needed for allergies.  Marland Kitchen lisinopril (PRINIVIL,ZESTRIL) 20 MG tablet Take 1 tablet (20 mg total) by mouth 2 (two) times daily. (Patient taking differently: Take 20 mg by  mouth daily. )  . metoprolol succinate (TOPROL-XL) 50 MG 24 hr tablet Take 50mg  by mouth once daily  . montelukast (SINGULAIR) 10 MG tablet Take 10 mg by mouth at bedtime.  . nitroGLYCERIN (NITROSTAT) 0.4 MG SL tablet Place 1 tablet (0.4 mg total) under the tongue every 5 (five) minutes as needed for chest pain.  . pantoprazole (PROTONIX) 40 MG tablet Take 1 tablet (40 mg total) by mouth daily.     Allergies:   Patient has no known allergies.   Social History   Tobacco Use  . Smoking status: Never Smoker  . Smokeless tobacco: Never Used  Substance Use Topics  . Alcohol use: Yes    Alcohol/week: 5.0 standard drinks    Types: 5 Glasses of wine per week  . Drug use: No     Family Hx: The patient's family history includes COPD in his mother; Diabetes type II in his sister; Heart attack in his father; Lupus in his sister. There is no history of Colon cancer, Pancreatic cancer, or Stomach cancer.  ROS:   Please see the history of present illness.   All other systems reviewed are negative.    Objective:    Vital Signs:  BP (!) 158/78   Pulse 61   Ht 5\' 10"  (1.778 m)   Wt 248 lb (112.5 kg)   BMI 35.58 kg/m    Wt Readings from Last 3 Encounters:  07/20/18 248 lb (112.5 kg)  12/08/17 251 lb 12.8 oz (114.2 kg)  10/21/17 252 lb 1.9 oz (114.4 kg)    Alert male in no acute distress. Not short of breath with conversation.  Repeat BP is 150/78.    Labs/Other Tests and Data Reviewed:    Lab Results  Component Value Date   WBC 8.1 10/22/2017   HGB 16.8 10/22/2017   HCT 46.1 10/22/2017   PLT 324 10/22/2017   GLUCOSE 103 (H) 11/11/2017   CHOL 146 10/22/2017   TRIG 140 10/22/2017   HDL 37 (L) 10/22/2017   LDLDIRECT 68.2 10/25/2010   LDLCALC 81 10/22/2017   ALT 32 10/22/2017   AST 21 10/22/2017   NA 141 11/11/2017   K 3.7 11/11/2017   CL 100 11/11/2017   CREATININE 0.79 11/11/2017   BUN 14 11/11/2017   CO2 24 11/11/2017   TSH 1.81 07/17/2014   PSA 1.03 08/29/2009   INR  1.1 10/29/2015   HGBA1C 5.9 (H) 04/04/2015     BNP (last 3 results) No results for input(s): BNP in the last 8760 hours.  ProBNP (last 3 results) No results for input(s): PROBNP in the last 8760 hours.    Prior CV studies:    The following studies were reviewed today:  Cardiac Cath 10-31-2015: Conclusion     Prox LAD lesion, 90 %stenosed.  1. Severe native three-vessel coronary artery disease with severe distal left main stenosis, severe ostial LAD stenosis, severe diffuse left circumflex stenosis, and severe right PDA stenosis  2. Status post CABG with continued patency of the LIMA to LAD, free RIMA to ramus intermedius, and SVG to right PDA  3. 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      ASSESSMENT & PLAN:    1.HTN - not ideal - he has gone back to just once a day dosing of his Lisinopril - he is going to monitor more - for now, will leave on his current regimen.   2. CAD- he has had multiple interventions - subsequent CABG x 3 in 04/2015 and then PCI from 10/2015 to the LCX due to recurrent angina.  Needs aggressive CV risk factor modification - this has been challenging for him chronically.   3. Hyperlipidemia:he remains on PCKS9 - no recent labs noted.  He is going to try to get his labs in a few weeks with PCP - if now, we will get when he comes back in town.   4. Prior cholecystectomy  5. Chronic sinus/allergy issues - not endorsed.   6. COVID-19 Education: The signs and symptoms of COVID-19 were discussed with the patient and how to seek care for testing (follow up with PCP or arrange E-visit).  The importance of social distancing, staying at home, hand hygiene and wearing a mask when out in public were discussed today.  Patient Risk:    After full review of this patient's clinical status, I feel that they are at least moderate risk at this time.  Time:   Today, I have spent 10 minutes with the patient with telehealth technology discussing the above issues.     Medication Adjustments/Labs and Tests Ordered: Current medicines are reviewed at length with the patient today.  Concerns regarding medicines are outlined above.   Tests Ordered: No orders of the defined types were placed in this encounter.   Medication Changes: No orders of the defined types were placed in this encounter.   Disposition:  FU with me in 4 months.   Patient is agreeable to this plan and will call if any problems develop in the interim.   Amie Critchley, NP  07/20/2018 2:48 PM    [facility]

## 2018-07-20 ENCOUNTER — Other Ambulatory Visit: Payer: Self-pay

## 2018-07-20 ENCOUNTER — Telehealth (INDEPENDENT_AMBULATORY_CARE_PROVIDER_SITE_OTHER): Payer: BC Managed Care – PPO | Admitting: Nurse Practitioner

## 2018-07-20 ENCOUNTER — Encounter: Payer: Self-pay | Admitting: Nurse Practitioner

## 2018-07-20 VITALS — BP 150/78 | HR 61 | Ht 70.0 in | Wt 248.0 lb

## 2018-07-20 DIAGNOSIS — I2510 Atherosclerotic heart disease of native coronary artery without angina pectoris: Secondary | ICD-10-CM

## 2018-07-20 DIAGNOSIS — I10 Essential (primary) hypertension: Secondary | ICD-10-CM

## 2018-07-20 DIAGNOSIS — Z7189 Other specified counseling: Secondary | ICD-10-CM

## 2018-07-20 DIAGNOSIS — E785 Hyperlipidemia, unspecified: Secondary | ICD-10-CM

## 2018-07-20 DIAGNOSIS — Z951 Presence of aortocoronary bypass graft: Secondary | ICD-10-CM

## 2018-07-20 NOTE — Patient Instructions (Addendum)
After Visit Summary:  We will be checking the following labs today - NONE  Let me know if you would like to get your labs done in our office - we can arrange that for you.    Medication Instructions:    Continue with your current medicines.    If you need a refill on your cardiac medications before your next appointment, please call your pharmacy.     Testing/Procedures To Be Arranged:  N/A  Follow-Up:   See me in 4 months    At [facility], you and your health needs are our priority.  As part of our continuing mission to provide you with exceptional heart care, we have created designated Provider Care Teams.  These Care Teams include your primary Cardiologist (physician) and Advanced Practice Providers (APPs -  Physician Assistants and Nurse Practitioners) who all work together to provide you with the care you need, when you need it.  Special Instructions:  . Stay safe, stay home, wash your hands for at least 20 seconds and wear a mask when out in public.  . It was good to talk with you today. Marland Kitchen Keep a check on your blood pressure for [place] - for now, no changes with your medicines.     Call the [place] office at [phone] if you have any questions, problems or concerns.

## 2018-07-26 DIAGNOSIS — R6889 Other general symptoms and signs: Secondary | ICD-10-CM | POA: Diagnosis not present

## 2018-07-27 DIAGNOSIS — R6889 Other general symptoms and signs: Secondary | ICD-10-CM | POA: Diagnosis not present

## 2018-07-29 ENCOUNTER — Other Ambulatory Visit: Payer: Self-pay | Admitting: Cardiovascular Disease

## 2018-08-16 DIAGNOSIS — R82998 Other abnormal findings in urine: Secondary | ICD-10-CM | POA: Diagnosis not present

## 2018-08-16 DIAGNOSIS — E291 Testicular hypofunction: Secondary | ICD-10-CM | POA: Diagnosis not present

## 2018-08-16 DIAGNOSIS — I10 Essential (primary) hypertension: Secondary | ICD-10-CM | POA: Diagnosis not present

## 2018-08-16 DIAGNOSIS — E782 Mixed hyperlipidemia: Secondary | ICD-10-CM | POA: Diagnosis not present

## 2018-08-16 DIAGNOSIS — Z125 Encounter for screening for malignant neoplasm of prostate: Secondary | ICD-10-CM | POA: Diagnosis not present

## 2018-08-16 DIAGNOSIS — Z20818 Contact with and (suspected) exposure to other bacterial communicable diseases: Secondary | ICD-10-CM | POA: Diagnosis not present

## 2018-08-16 DIAGNOSIS — M109 Gout, unspecified: Secondary | ICD-10-CM | POA: Diagnosis not present

## 2018-08-16 DIAGNOSIS — R7301 Impaired fasting glucose: Secondary | ICD-10-CM | POA: Diagnosis not present

## 2018-08-19 DIAGNOSIS — Z9861 Coronary angioplasty status: Secondary | ICD-10-CM | POA: Diagnosis not present

## 2018-08-19 DIAGNOSIS — Z0000 Encounter for general adult medical examination without abnormal findings: Secondary | ICD-10-CM | POA: Diagnosis not present

## 2018-08-19 DIAGNOSIS — Z1331 Encounter for screening for depression: Secondary | ICD-10-CM | POA: Diagnosis not present

## 2018-08-19 DIAGNOSIS — E782 Mixed hyperlipidemia: Secondary | ICD-10-CM | POA: Diagnosis not present

## 2018-08-19 DIAGNOSIS — E8881 Metabolic syndrome: Secondary | ICD-10-CM | POA: Diagnosis not present

## 2018-08-19 DIAGNOSIS — I10 Essential (primary) hypertension: Secondary | ICD-10-CM | POA: Diagnosis not present

## 2018-08-31 ENCOUNTER — Other Ambulatory Visit: Payer: Self-pay | Admitting: Internal Medicine

## 2018-10-22 ENCOUNTER — Other Ambulatory Visit: Payer: Self-pay

## 2018-10-22 MED ORDER — AMOXICILLIN 500 MG PO CAPS: 500.0000 mg | ORAL_CAPSULE | Freq: Two times a day (BID) | ORAL | 1 refills | Status: DC

## 2018-10-30 ENCOUNTER — Other Ambulatory Visit: Payer: Self-pay | Admitting: Cardiovascular Disease

## 2018-11-08 DIAGNOSIS — L821 Other seborrheic keratosis: Secondary | ICD-10-CM | POA: Diagnosis not present

## 2018-11-08 DIAGNOSIS — L820 Inflamed seborrheic keratosis: Secondary | ICD-10-CM | POA: Diagnosis not present

## 2018-11-08 DIAGNOSIS — L570 Actinic keratosis: Secondary | ICD-10-CM | POA: Diagnosis not present

## 2018-11-08 DIAGNOSIS — D485 Neoplasm of uncertain behavior of skin: Secondary | ICD-10-CM | POA: Diagnosis not present

## 2018-11-08 DIAGNOSIS — L814 Other melanin hyperpigmentation: Secondary | ICD-10-CM | POA: Diagnosis not present

## 2018-11-18 NOTE — Progress Notes (Deleted)
CARDIOLOGY OFFICE NOTE  Date:  11/18/2018    Leonard Edwards Date of Birth: 1955-04-30 Medical Record [id number]  PCP:  Marton Redwood, MD  Cardiologist:  Jerel Shepherd   No chief complaint on file.   History of Present Illness: Leonard Edwards is a 63 y.o. male who presents today for a 4 month check.  Seen for Dr. Burt Knack.  He has a history of CAD, HLDand hypertension. He initially presented several years ago with acute coronary syndrome and underwent PCI to right coronary artery in 2006. He developed recurrent anginal symptoms and underwent stenting of the ramus intermedius branch in 2011. He ultimately was referred for CABG in 2017 - he [place] to LAD, free RIMA to ramus and SVG to PD in March of 2017.He had recurrent angina in September of 2017 - ended up getting3 DES to the LCX due to new critical stenosis of the distal LCX - his grafts are open. Recommended to have life long DAPT.He has had long standing issues with trying to adhere to CV risk factor modification.  He was last seen by Dr. Burt Knack in December of 2018 - doing well.he has since had to have his gallbladder out. Struggles with CV risk factor modification. BP up and down and we have had to adjust his medicines. He has pretty much isolated himself to the beach with the current pandemic. Had not increased his ACE as previously instructed.    The patient {does/does not:200015} have symptoms concerning for COVID-19 infection (fever, chills, cough, or new shortness of breath).   Comes in today. Here with   Past Medical History:  Diagnosis Date  . Benign prostatic hypertrophy   . CAD (coronary artery disease)    a. DES to distal RCA in 09/2004; DES to Ramus Intermediate 10/2009 following abnormal stress test b. 9/27 PCI with DES x3 to LCrx  . Chronic bronchitis ([place])    "in the spring"  . Esophageal stricture   . Exertional angina ([place]) 10/31/2015  . Fibromyalgia   . Gastric polyp   . GERD  (gastroesophageal reflux disease)   . Hiatal hernia   . High cholesterol   . History of gout   . Hyperlipidemia   . Hypertension   . Obesity   . Pneumonia    hx of    Past Surgical History:  Procedure Laterality Date  . CARDIAC CATHETERIZATION N/A 03/29/2015   Procedure: Left Heart Cath and Coronary Angiography;  Surgeon: Sherren Mocha, MD;  Location: [place] [facility];  Service: Cardiovascular;  Laterality: N/A;  . CARDIAC CATHETERIZATION N/A 10/31/2015   Procedure: Left Heart Cath and Cors/Grafts Angiography;  Surgeon: Sherren Mocha, MD;  Location: [place] [facility];  Service: Cardiovascular;  Laterality: N/A;  . CARDIAC CATHETERIZATION N/A 10/31/2015   Procedure: Coronary Stent Intervention;  Surgeon: Sherren Mocha, MD;  Location: [facility];  Service: Cardiovascular;  Laterality: N/A;  . CHOLECYSTECTOMY N/A 07/07/2017   Procedure: LAPAROSCOPIC CHOLECYSTECTOMY WITH INTRAOPERATIVE CHOLANGIOGRAM;  Surgeon: Excell Seltzer, MD;  [facility];  Service: General;  Laterality: N/A;  . COLONOSCOPY W/ POLYPECTOMY    . CORONARY ANGIOPLASTY WITH STENT PLACEMENT  09/2004-10/2009   DES to distal RCA in 09/2004; DES to Ramus Intermediate 10/2009 following abnormal stress test  . CORONARY ARTERY BYPASS GRAFT N/A 04/06/2015   Procedure: CORONARY ARTERY BYPASS GRAFTING (CABG) x three,  using bilateral mammaries, and right leg greater saphenous vein harvested endoscopically;  Surgeon: Gaye Pollack, MD;  Location:  [facility];  Service: Open Heart Surgery;  Laterality: N/A;  . ESOPHAGOGASTRODUODENOSCOPY (EGD) WITH ESOPHAGEAL DILATION    . SHOULDER ARTHROSCOPY Right 2015   scar tissue  . TEE WITHOUT CARDIOVERSION N/A 04/06/2015   Procedure: TRANSESOPHAGEAL ECHOCARDIOGRAM (TEE);  Surgeon: Gaye Pollack, MD;  Location: [facility];  Service: Open Heart Surgery;  Laterality: N/A;  . ULTRASOUND GUIDANCE FOR VASCULAR ACCESS  03/29/2015   Procedure: Ultrasound Guidance For Vascular Access;  Surgeon: Sherren Mocha, MD;  Location: [facility];  Service: Cardiovascular;;  . UMBILICAL HERNIA REPAIR N/A 07/07/2017   Procedure: HERNIA REPAIR UMBILICAL ADULT;  Surgeon: Excell Seltzer, MD;  [facility];  Service: General;  Laterality: N/A;     Medications: No outpatient medications have been marked as taking for the 11/23/18 encounter (Appointment) with Burtis Junes, NP.     Allergies: No Known Allergies  Social History: The patient  reports that he has never smoked. He has never used smokeless tobacco. He reports current alcohol use of about 5.0 standard drinks of alcohol per week. He reports that he does not use drugs.   Family History: The patient's ***family history includes COPD in his mother; Diabetes type II in his sister; Heart attack in his father; Lupus in his sister.   Review of Systems: Please see the history of present illness.   All other systems are reviewed and negative.   Physical Exam: VS:  There were no vitals taken for this visit. Marland Kitchen  BMI There is no height or weight on file to calculate BMI.  Wt Readings from Last 3 Encounters:  07/20/18 248 lb (112.5 kg)  12/08/17 251 lb 12.8 oz (114.2 kg)  10/21/17 252 lb 1.9 oz (114.4 kg)    General: Pleasant. Well developed, well nourished and in no acute distress.   HEENT: Normal.  Neck: Supple, no JVD, carotid bruits, or masses noted.  Cardiac: ***Regular rate and rhythm. No murmurs, rubs, or gallops. No edema.  Respiratory:  Lungs are clear to auscultation bilaterally with normal work of breathing.  GI: Soft and nontender.  MS: No deformity or atrophy. Gait and ROM intact.  Skin: Warm and dry. Color is normal.  Neuro:  Strength and sensation are intact and no gross focal deficits noted.  Psych: Alert, appropriate and with normal affect.   LABORATORY DATA:  EKG:  EKG {ACTION; IS/IS [id number] ordered today. This demonstrates ***.  Lab Results  Component Value Date   WBC 8.1 10/22/2017   HGB 16.8  10/22/2017   HCT 46.1 10/22/2017   PLT 324 10/22/2017   GLUCOSE 103 (H) 11/11/2017   CHOL 146 10/22/2017   TRIG 140 10/22/2017   HDL 37 (L) 10/22/2017   LDLDIRECT 68.2 10/25/2010   LDLCALC 81 10/22/2017   ALT 32 10/22/2017   AST 21 10/22/2017   NA 141 11/11/2017   K 3.7 11/11/2017   CL 100 11/11/2017   CREATININE 0.79 11/11/2017   BUN 14 11/11/2017   CO2 24 11/11/2017   TSH 1.81 07/17/2014   PSA 1.03 08/29/2009   INR 1.1 10/29/2015   HGBA1C 5.9 (H) 04/04/2015       BNP (last 3 results) No results for input(s): BNP in the last 8760 hours.  ProBNP (last 3 results) No results for input(s): PROBNP in the last 8760 hours.   Other Studies Reviewed Today:  Cardiac Cath 10-31-2015: Conclusion     Prox LAD lesion, 90 %stenosed.  1. Severe native three-vessel coronary artery disease with severe  distal left main stenosis, severe ostial LAD stenosis, severe diffuse left circumflex stenosis, and severe right PDA stenosis  2. Status post CABG with continued patency of the LIMA to LAD, free RIMA to ramus intermedius, and SVG to right PDA  3. 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      ASSESSMENT & PLAN:    1.HTN - not ideal - he has gone back to just once a day dosing of his Lisinopril - he is going to monitor more - for now, will leave on his current regimen.   2.CAD- he has had multiple interventions - subsequent CABG x 3 in 04/2015 and then PCI from 10/2015 to the LCX due to recurrent angina. Needs aggressive CV risk factor modification - this has been challenging for him chronically.   3. Hyperlipidemia:he remains on PCKS9 -no recent labs noted.  He is going to try to get his labs in a few weeks with PCP - if now, we will get when he comes back  in town.   4.Priorcholecystectomy  5. Chronic sinus/allergy issues - not endorsed.   Marland Kitchen COVID-19 Education: The signs and symptoms of COVID-19 were discussed with the patient and how to seek care for testing (follow up with PCP or arrange E-visit).  The importance of social distancing, staying at home, hand hygiene and wearing a mask when out in public were discussed today.  Current medicines are reviewed with the patient today.  The patient does not have concerns regarding medicines other than what has been noted above.  The following changes have been made:  See above.  Labs/ tests ordered today include:   No orders of the defined types were placed in this encounter.    Disposition:   FU with *** in {gen number [id number] {Days to years:10300}.   Patient is agreeable to this plan and will call if any problems develop in the interim.   SignedTruitt Merle, NP  11/18/2018 3:25 PM  [facility] [address] [place], [place]  [address] Phone: [phone] Fax: [phone]

## 2018-11-22 ENCOUNTER — Other Ambulatory Visit: Payer: Self-pay | Admitting: Cardiovascular Disease

## 2018-11-23 ENCOUNTER — Ambulatory Visit: Payer: BC Managed Care – PPO | Admitting: Nurse Practitioner

## 2018-12-21 ENCOUNTER — Other Ambulatory Visit: Payer: Self-pay | Admitting: Nurse Practitioner

## 2019-01-14 ENCOUNTER — Other Ambulatory Visit: Payer: Self-pay | Admitting: Cardiovascular Disease

## 2019-01-14 NOTE — Telephone Encounter (Signed)
    Pt c/o medication issue:  1. Name of Medication: REPATHA  2. How are you currently taking this medication (dosage and times per day)? N/A  3. Are you having a reaction (difficulty breathing--STAT)? NO  4. What is your medication issue? Put medication in freezer, unable to use. Patient reached out to [facility] to refill, however dose due today.      Patient calling the office for samples of medication:   1.  What medication and dosage are you requesting samples for? Repatha  2.  Are you currently out of this medication? YES

## 2019-01-14 NOTE — Telephone Encounter (Signed)
Called and spoke with patient wife. Will provide 2 samples for the patient. Will leave downstairs for patients wife to pick up today. States she will be there is about 2 hours.

## 2019-01-17 ENCOUNTER — Telehealth: Payer: Self-pay | Admitting: Cardiovascular Disease

## 2019-01-17 NOTE — Telephone Encounter (Signed)
S/w pt made appt for Jan per pt.  Answered questions about covid-19 vaccine and will be discussed further at upcoming ov with Cecille Rubin.

## 2019-01-17 NOTE — Telephone Encounter (Signed)
New Message    Patient is calling in with questions that he would like to ask [name] or her nurse about his medication and the covid-19 vaccine. Please give patient a call back to discuss.

## 2019-01-21 DIAGNOSIS — Z03818 Encounter for observation for suspected exposure to other biological agents ruled out: Secondary | ICD-10-CM | POA: Diagnosis not present

## 2019-02-10 ENCOUNTER — Other Ambulatory Visit: Payer: Self-pay | Admitting: Nurse Practitioner

## 2019-02-18 NOTE — Progress Notes (Signed)
CARDIOLOGY OFFICE NOTE  Date:  02/22/2019    Leonard Edwards Date of Birth: 1955/11/21 Medical Record [id number]  PCP:  Marton Redwood, MD  Cardiologist:  Jerel Shepherd   Chief Complaint  Patient presents with  . Follow-up    History of Present Illness: Leonard Edwards is a 64 y.o. male who presents today for a follow up visit.  Seen for Dr. Burt Knack.  He has a history of CAD, HLDand hypertension. He initially presented several years ago with acute coronary syndrome and underwent PCI to right coronary artery in 2006. He developed recurrent anginal symptoms and underwent stenting of the ramus intermedius branch in 2011. He ultimately was referred for CABG in 2017 - he [place] to LAD, free RIMA to ramus and SVG to PD in March of 2017.He had recurrent angina in September of 2017 - ended up getting3 DES to the LCX due to new critical stenosis of the distal LCX - his grafts were open. Recommended to have life long DAPT.He has had long standing issues with trying to adhere to CV risk factor modification.  He was last seen by Dr. Burt Knack in December of 2018 - doing well.I have seen him sporadically since - he has had gallbladder issues and ended up having cholecystectomy without issue. We have increased his BP medicines.   Last seen by telehealth visit in June - had been at the beach since the start of the pandemic - doing ok. BP was up a little and he was to monitor.   The patient does not have symptoms concerning for COVID-19 infection (fever, chills, cough, or new shortness of breath).   Comes in today. Here alone. He is feeling good. The pandemic has been hard. 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 No chest pain. He just took his morning medicines right after he got here. BP high here today - has also had a few doses of Indocin as well.  BP running around 140/85 at home. He has gained weight. He stopped exercising until just a  few months ago - trying to get back on track. No chest pain. Breathing is ok. He is planning on having a vaccine when available to him.   Past Medical History:  Diagnosis Date  . Benign prostatic hypertrophy   . CAD (coronary artery disease)    a. DES to distal RCA in 09/2004; DES to Ramus Intermediate 10/2009 following abnormal stress test b. 9/27 PCI with DES x3 to LCrx  . Chronic bronchitis ([place])    "in the spring"  . Esophageal stricture   . Exertional angina ([place]) 10/31/2015  . Fibromyalgia   . Gastric polyp   . GERD (gastroesophageal reflux disease)   . Hiatal hernia   . High cholesterol   . History of gout   . Hyperlipidemia   . Hypertension   . Obesity   . Pneumonia    hx of    Past Surgical History:  Procedure Laterality Date  . CARDIAC CATHETERIZATION N/A 03/29/2015   Procedure: Left Heart Cath and Coronary Angiography;  Surgeon: Sherren Mocha, MD;  Location: [facility] [place] [facility];  Service: Cardiovascular;  Laterality: N/A;  . CARDIAC CATHETERIZATION N/A 10/31/2015   Procedure: Left Heart Cath and Cors/Grafts Angiography;  Surgeon: Sherren Mocha, MD;  Location: [facility];  Service: Cardiovascular;  Laterality: N/A;  . CARDIAC CATHETERIZATION N/A 10/31/2015   Procedure: Coronary Stent Intervention;  Surgeon: Sherren Mocha, MD;  Location: [facility];  Service: Cardiovascular;  Laterality: N/A;  . CHOLECYSTECTOMY N/A 07/07/2017   Procedure: LAPAROSCOPIC CHOLECYSTECTOMY WITH INTRAOPERATIVE CHOLANGIOGRAM;  Surgeon: Excell Seltzer, MD;  [facility];  Service: General;  Laterality: N/A;  . COLONOSCOPY W/ POLYPECTOMY    . CORONARY ANGIOPLASTY WITH STENT PLACEMENT  09/2004-10/2009   DES to distal RCA in 09/2004; DES to Ramus Intermediate 10/2009 following abnormal stress test  . CORONARY ARTERY BYPASS GRAFT N/A 04/06/2015   Procedure: CORONARY ARTERY BYPASS GRAFTING (CABG) x three,  using bilateral mammaries, and right leg greater saphenous vein harvested  endoscopically;  Surgeon: Gaye Pollack, MD;  Location: [facility];  Service: Open Heart Surgery;  Laterality: N/A;  . ESOPHAGOGASTRODUODENOSCOPY (EGD) WITH ESOPHAGEAL DILATION    . SHOULDER ARTHROSCOPY Right 2015   scar tissue  . TEE WITHOUT CARDIOVERSION N/A 04/06/2015   Procedure: TRANSESOPHAGEAL ECHOCARDIOGRAM (TEE);  Surgeon: Gaye Pollack, MD;  Location: [facility];  Service: Open Heart Surgery;  Laterality: N/A;  . ULTRASOUND GUIDANCE FOR VASCULAR ACCESS  03/29/2015   Procedure: Ultrasound Guidance For Vascular Access;  Surgeon: Sherren Mocha, MD;  Location: [facility];  Service: Cardiovascular;;  . UMBILICAL HERNIA REPAIR N/A 07/07/2017   Procedure: HERNIA REPAIR UMBILICAL ADULT;  Surgeon: Excell Seltzer, MD;  [facility];  Service: General;  Laterality: N/A;     Medications: Current Meds  Medication Sig  . allopurinol (ZYLOPRIM) 300 MG tablet Take 300 mg by mouth daily.  Marland Kitchen amLODipine (NORVASC) 10 MG tablet TAKE 1 TABLET(10 MG) BY MOUTH DAILY AT BEDTIME  . amoxicillin (AMOXIL) 500 MG capsule Take 1 capsule (500 mg total) by mouth 2 (two) times daily.  Marland Kitchen aspirin EC 81 MG tablet Take 81 mg by mouth daily.  Marland Kitchen CIALIS 20 MG tablet TAKE ONE TABLET DAILY AS NEEDED FOR     ERECTILE DYSFUNCTION  . clopidogrel (PLAVIX) 75 MG tablet TAKE 1 TABLET BY MOUTH DAILY WITH BREAKFAST  . Coenzyme Q10 (CO Q 10) 100 MG CAPS Take 100 mg by mouth daily.    . colchicine 0.6 MG tablet Take 0.6 mg by mouth as needed (gout flare up).   . diphenhydramine-acetaminophen (TYLENOL PM) 25-500 MG TABS tablet Take 0.5 tablets by mouth at bedtime as needed (sleep).  . Evolocumab (REPATHA SURECLICK) XX123456 MG/ML SOAJ Inject 1 pen into the skin every 14 (fourteen) days.  . fluticasone (FLONASE) 50 MCG/ACT nasal spray Place 2 sprays into both nostrils daily as needed for allergies or rhinitis.  . hydrochlorothiazide (HYDRODIURIL) 25 MG tablet TAKE 1 TABLET(25 MG) BY MOUTH DAILY  . indomethacin (INDOCIN) 50 MG capsule  TAKE 1 CAPSULE BY MOUTH THREE TIMES DAILY AS NEEDED  . levocetirizine (XYZAL) 5 MG tablet Take 5 mg by mouth daily as needed for allergies.  Marland Kitchen lisinopril (ZESTRIL) 20 MG tablet TAKE 1 TABLET(20 MG) BY MOUTH TWICE DAILY  . lisinopril-hydrochlorothiazide (ZESTORETIC) 10-12.5 MG tablet Take 1 tablet by mouth daily.  . metoprolol succinate (TOPROL-XL) 50 MG 24 hr tablet TAKE 1 TABLET BY MOUTH EVERY DAY  . montelukast (SINGULAIR) 10 MG tablet Take 10 mg by mouth at bedtime.  . nitroGLYCERIN (NITROSTAT) 0.4 MG SL tablet Place 1 tablet (0.4 mg total) under the tongue every 5 (five) minutes as needed for chest pain.  . pantoprazole (PROTONIX) 40 MG tablet TAKE 1 TABLET(40 MG) BY MOUTH DAILY  . PROAIR HFA 108 (90 Base) MCG/ACT inhaler Inhale 1 puff into the lungs every 4 (four) hours as needed for wheezing (  allergies).     Allergies: No Known Allergies  Social History: The patient  reports that he has never smoked. He has never used smokeless tobacco. He reports current alcohol use of about 5.0 standard drinks of alcohol per week. He reports that he does not use drugs.   Family History: The patient's family history includes COPD in his mother; Diabetes type II in his sister; Heart attack in his father; Lupus in his sister.   Review of Systems: Please see the history of present illness.   All other systems are reviewed and negative.   Physical Exam: VS:  BP (!) 160/84   Pulse (!) 52   Ht 5\' 10"  (1.778 m)   Wt 263 lb (119.3 kg)   SpO2 97%   BMI 37.74 kg/m  .  BMI Body mass index is 37.74 kg/m.  Wt Readings from Last 3 Encounters:  02/22/19 263 lb (119.3 kg)  07/20/18 248 lb (112.5 kg)  12/08/17 251 lb 12.8 oz (114.2 kg)   BP is 170/90 by me (just took his medicines about 10 minutes prior).   General: Pleasant. Alert and in no acute distress. He has gained weight - up 15 pounds.   HEENT: Normal.  Neck: Supple, no JVD, carotid bruits, or masses noted.  Cardiac: Regular rate and rhythm.  No murmurs, rubs, or gallops. No edema.  Respiratory:  Lungs are clear to auscultation bilaterally with normal work of breathing.  GI: Soft and nontender.  MS: No deformity or atrophy. Gait and ROM intact.  Skin: Warm and dry. Color is normal.  Neuro:  Strength and sensation are intact and no gross focal deficits noted.  Psych: Alert, appropriate and with normal affect.   LABORATORY DATA:  EKG:  EKG is ordered today. This demonstrates sinus bradycardia - HR is 52.   Lab Results  Component Value Date   WBC 8.1 10/22/2017   HGB 16.8 10/22/2017   HCT 46.1 10/22/2017   PLT 324 10/22/2017   GLUCOSE 103 (H) 11/11/2017   CHOL 146 10/22/2017   TRIG 140 10/22/2017   HDL 37 (L) 10/22/2017   LDLDIRECT 68.2 10/25/2010   LDLCALC 81 10/22/2017   ALT 32 10/22/2017   AST 21 10/22/2017   NA 141 11/11/2017   K 3.7 11/11/2017   CL 100 11/11/2017   CREATININE 0.79 11/11/2017   BUN 14 11/11/2017   CO2 24 11/11/2017   TSH 1.81 07/17/2014   PSA 1.03 08/29/2009   INR 1.1 10/29/2015   HGBA1C 5.9 (H) 04/04/2015       BNP (last 3 results) No results for input(s): BNP in the last 8760 hours.  ProBNP (last 3 results) No results for input(s): PROBNP in the last 8760 hours.   Other Studies Reviewed Today:  Cardiac Cath 10-31-2015: Conclusion     Prox LAD lesion, 90 %stenosed.  1. Severe native three-vessel coronary artery disease with severe distal left main stenosis, severe ostial LAD stenosis, severe diffuse left circumflex stenosis, and severe right PDA stenosis  2. Status post CABG with continued patency of the LIMA to LAD, free RIMA to ramus intermedius, and SVG to right PDA  3. 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      ASSESSMENT & PLAN:    1. CAD -  prior multiple interventions, then CABG x 3 in 2017 and then PCI to the LCX  in 10/2015 - no active symptoms - needs aggressive risk factor modification which has always been a challenge for him.   2. HTN - just took his medicines - he says he has better control at home - he is to monitor more over the next few days. We will call him for an update early next week. He has tended to only take his ACE once a day. I worry about overall compliance.   3. HLD - on statin - lab today.   4. Obesity - weight is up - he admits to eating more - needs to really work on this and he is aware.   5. COVID-19 Education: The signs and symptoms of COVID-19 were discussed with the patient and how to seek care for testing (follow up with PCP or arrange E-visit).  The importance of social distancing, staying at home, hand hygiene and wearing a mask when out in public were discussed today.  Current medicines are reviewed with the patient today.  The patient does not have concerns regarding medicines other than what has been noted above.  The following changes have been made:  See above.  Labs/ tests ordered today include:    Orders Placed This Encounter  Procedures  . Basic metabolic panel  . CBC  . Hepatic function panel  . Lipid panel  . EKG 12-Lead     Disposition:   FU with me in 6 months.   Patient is agreeable to this plan and will call if any problems develop in the interim.   SignedTruitt Merle, NP  02/22/2019 4:33 PM  [facility] [address] [place], [place]  [address] Phone: [phone] Fax: [phone]

## 2019-02-22 ENCOUNTER — Ambulatory Visit (INDEPENDENT_AMBULATORY_CARE_PROVIDER_SITE_OTHER): Payer: BC Managed Care – PPO | Admitting: Nurse Practitioner

## 2019-02-22 ENCOUNTER — Other Ambulatory Visit: Payer: Self-pay

## 2019-02-22 ENCOUNTER — Encounter: Payer: Self-pay | Admitting: Nurse Practitioner

## 2019-02-22 VITALS — BP 160/84 | HR 52 | Ht 70.0 in | Wt 263.0 lb

## 2019-02-22 DIAGNOSIS — Z951 Presence of aortocoronary bypass graft: Secondary | ICD-10-CM

## 2019-02-22 DIAGNOSIS — I10 Essential (primary) hypertension: Secondary | ICD-10-CM | POA: Diagnosis not present

## 2019-02-22 DIAGNOSIS — I2510 Atherosclerotic heart disease of native coronary artery without angina pectoris: Secondary | ICD-10-CM

## 2019-02-22 DIAGNOSIS — E785 Hyperlipidemia, unspecified: Secondary | ICD-10-CM | POA: Diagnosis not present

## 2019-02-22 NOTE — Patient Instructions (Addendum)
After Visit Summary:  We will be checking the following labs today - BMET, CBC, lipids and HPF   Medication Instructions:    Continue with your current medicines.    If you need a refill on your cardiac medications before your next appointment, please call your pharmacy.     Testing/Procedures To Be Arranged:  N/A  Follow-Up:   See me in 6 months    At [facility], you and your health needs are our priority.  As part of our continuing mission to provide you with exceptional heart care, we have created designated Provider Care Teams.  These Care Teams include your primary Cardiologist (physician) and Advanced Practice Providers (APPs -  Physician Assistants and Nurse Practitioners) who all work together to provide you with the care you need, when you need it.  Special Instructions:  . Stay safe, stay home, wash your hands for at least 20 seconds and wear a mask when out in public.  . It was good to talk with you today.  . Monitor your BP for me - we will call you next week to get an update.    Call the [place] office at [phone] if you have any questions, problems or concerns.

## 2019-02-23 LAB — BASIC METABOLIC PANEL
BUN/Creatinine Ratio: 19 (ref 10–24)
BUN: 15 mg/dL (ref 8–27)
CO2: 26 mmol/L (ref 20–29)
Calcium: 10 mg/dL (ref 8.6–10.2)
Chloride: 102 mmol/L (ref 96–106)
Creatinine, Ser: 0.8 mg/dL (ref 0.76–1.27)
GFR calc Af Amer: 110 mL/min/{1.73_m2} (ref >59)
GFR calc non Af Amer: 95 mL/min/{1.73_m2} (ref >59)
Glucose: 91 mg/dL (ref 65–99)
Potassium: 4.9 mmol/L (ref 3.5–5.2)
Sodium: 141 mmol/L (ref 134–144)

## 2019-02-23 LAB — LIPID PANEL
Chol/HDL Ratio: 2.5 ratio (ref 0.0–5.0)
Cholesterol, Total: 109 mg/dL (ref 100–199)
HDL: 43 mg/dL (ref >39)
LDL Chol Calc (NIH): 36 mg/dL (ref 0–99)
Triglycerides: 183 mg/dL — ABNORMAL HIGH (ref 0–149)
VLDL Cholesterol Cal: 30 mg/dL (ref 5–40)

## 2019-02-23 LAB — CBC
Hematocrit: 44.7 % (ref 37.5–51.0)
Hemoglobin: 16 g/dL (ref 13.0–17.7)
MCH: 32.1 pg (ref 26.6–33.0)
MCHC: 35.8 g/dL — ABNORMAL HIGH (ref 31.5–35.7)
MCV: 90 fL (ref 79–97)
Platelets: 323 10*3/uL (ref 150–450)
RBC: 4.98 x10E6/uL (ref 4.14–5.80)
RDW: 12.9 % (ref 11.6–15.4)
WBC: 11 10*3/uL — ABNORMAL HIGH (ref 3.4–10.8)

## 2019-02-23 LAB — HEPATIC FUNCTION PANEL
ALT: 44 IU/L (ref 0–44)
AST: 34 IU/L (ref 0–40)
Albumin: 4.4 g/dL (ref 3.8–4.8)
Alkaline Phosphatase: 93 IU/L (ref 39–117)
Bilirubin Total: 0.6 mg/dL (ref 0.0–1.2)
Bilirubin, Direct: 0.2 mg/dL (ref 0.00–0.40)
Total Protein: 7.4 g/dL (ref 6.0–8.5)

## 2019-03-01 ENCOUNTER — Other Ambulatory Visit: Payer: Self-pay | Admitting: *Deleted

## 2019-03-06 ENCOUNTER — Other Ambulatory Visit: Payer: Self-pay | Admitting: Nurse Practitioner

## 2019-06-06 DIAGNOSIS — M9901 Segmental and somatic dysfunction of cervical region: Secondary | ICD-10-CM | POA: Diagnosis not present

## 2019-06-06 DIAGNOSIS — M5413 Radiculopathy, cervicothoracic region: Secondary | ICD-10-CM | POA: Diagnosis not present

## 2019-06-06 DIAGNOSIS — M9903 Segmental and somatic dysfunction of lumbar region: Secondary | ICD-10-CM | POA: Diagnosis not present

## 2019-06-06 DIAGNOSIS — M5441 Lumbago with sciatica, right side: Secondary | ICD-10-CM | POA: Diagnosis not present

## 2019-06-13 DIAGNOSIS — M9903 Segmental and somatic dysfunction of lumbar region: Secondary | ICD-10-CM | POA: Diagnosis not present

## 2019-06-13 DIAGNOSIS — M9901 Segmental and somatic dysfunction of cervical region: Secondary | ICD-10-CM | POA: Diagnosis not present

## 2019-06-13 DIAGNOSIS — M5413 Radiculopathy, cervicothoracic region: Secondary | ICD-10-CM | POA: Diagnosis not present

## 2019-06-13 DIAGNOSIS — M5441 Lumbago with sciatica, right side: Secondary | ICD-10-CM | POA: Diagnosis not present

## 2019-06-22 DIAGNOSIS — M5413 Radiculopathy, cervicothoracic region: Secondary | ICD-10-CM | POA: Diagnosis not present

## 2019-06-22 DIAGNOSIS — M9903 Segmental and somatic dysfunction of lumbar region: Secondary | ICD-10-CM | POA: Diagnosis not present

## 2019-06-22 DIAGNOSIS — M9901 Segmental and somatic dysfunction of cervical region: Secondary | ICD-10-CM | POA: Diagnosis not present

## 2019-06-22 DIAGNOSIS — M5441 Lumbago with sciatica, right side: Secondary | ICD-10-CM | POA: Diagnosis not present

## 2019-06-27 ENCOUNTER — Telehealth: Payer: Self-pay | Admitting: Nurse Practitioner

## 2019-06-27 MED ORDER — REPATHA SURECLICK 140 MG/ML ~~LOC~~ SOAJ: 1.0000 "pen " | SUBCUTANEOUS | 11 refills | Status: DC

## 2019-06-27 NOTE — Telephone Encounter (Signed)
Pt c/o medication issue:  1. Name of Medication: Evolocumab (REPATHA SURECLICK) [id number] MG/ML SOAJ  2. How are you currently taking this medication (dosage and times per day)?   3. Are you having a reaction (difficulty breathing--STAT)? No   4. What is your medication issue? Jahon is calling requesting a Cecille Rubin or Andee Poles give him a call to give him some guidance on what he needs to due in regards to this medication. He states his insurance has transferred this prescription to [facility] and he never received his medication from them last month. He called them in regards to this and they do not accept the coupons he was using to get this medication at a discounted price. Jia is 10-15 days overdue for this medication, and does not have any injections left. Please advise.

## 2019-06-27 NOTE — Telephone Encounter (Signed)
Called pt and LVM for him to call back. Should be able to send rx to local pharmacy and use copay card. Mail order pharmacies do not accept copay cards, but speciality or retail do.

## 2019-06-27 NOTE — Telephone Encounter (Signed)
Spoke with patient. Will send Rx to [facility]. New copay card activated and faxed to [facility]

## 2019-07-01 ENCOUNTER — Other Ambulatory Visit: Payer: Self-pay | Admitting: Pharmacist

## 2019-07-05 ENCOUNTER — Other Ambulatory Visit: Payer: Self-pay

## 2019-07-05 NOTE — Telephone Encounter (Signed)
Pt needing his medication evolocumab (Repatha) sent to [facility] mail order pharmacy. Please address

## 2019-07-05 NOTE — Telephone Encounter (Signed)
Pt does not need rx sent here, he has been called twice in past week. Mail order pharmacies cannot use copay cards, he will fill locally.

## 2019-07-18 ENCOUNTER — Other Ambulatory Visit: Payer: Self-pay | Admitting: *Deleted

## 2019-07-18 NOTE — Telephone Encounter (Signed)
At least 3rd request from mail order, pt is filling locally so he can use copay card.

## 2019-07-27 ENCOUNTER — Telehealth: Payer: Self-pay | Admitting: *Deleted

## 2019-07-27 NOTE — Telephone Encounter (Signed)
Pt calling in today to confirm upcoming appt in July.

## 2019-08-01 DIAGNOSIS — M5416 Radiculopathy, lumbar region: Secondary | ICD-10-CM | POA: Diagnosis not present

## 2019-08-01 DIAGNOSIS — N5201 Erectile dysfunction due to arterial insufficiency: Secondary | ICD-10-CM | POA: Diagnosis not present

## 2019-08-01 DIAGNOSIS — M25511 Pain in right shoulder: Secondary | ICD-10-CM | POA: Diagnosis not present

## 2019-08-01 DIAGNOSIS — I10 Essential (primary) hypertension: Secondary | ICD-10-CM | POA: Diagnosis not present

## 2019-08-01 DIAGNOSIS — E291 Testicular hypofunction: Secondary | ICD-10-CM | POA: Diagnosis not present

## 2019-08-04 DIAGNOSIS — E291 Testicular hypofunction: Secondary | ICD-10-CM | POA: Diagnosis not present

## 2019-08-15 DIAGNOSIS — S83241A Other tear of medial meniscus, current injury, right knee, initial encounter: Secondary | ICD-10-CM | POA: Diagnosis not present

## 2019-08-15 DIAGNOSIS — M7061 Trochanteric bursitis, right hip: Secondary | ICD-10-CM | POA: Diagnosis not present

## 2019-08-16 DIAGNOSIS — M23611 Other spontaneous disruption of anterior cruciate ligament of right knee: Secondary | ICD-10-CM | POA: Diagnosis not present

## 2019-08-16 DIAGNOSIS — M25561 Pain in right knee: Secondary | ICD-10-CM | POA: Diagnosis not present

## 2019-08-16 DIAGNOSIS — M23303 Other meniscus derangements, unspecified medial meniscus, right knee: Secondary | ICD-10-CM | POA: Diagnosis not present

## 2019-08-16 DIAGNOSIS — M23631 Other spontaneous disruption of medial collateral ligament of right knee: Secondary | ICD-10-CM | POA: Diagnosis not present

## 2019-08-16 DIAGNOSIS — M25461 Effusion, right knee: Secondary | ICD-10-CM | POA: Diagnosis not present

## 2019-08-16 NOTE — Progress Notes (Deleted)
CARDIOLOGY OFFICE NOTE  Date:  08/16/2019    Leonard Edwards Date of Birth: 07-09-1955 Medical Record #[id number]  PCP:  Marton Redwood, MD  Cardiologist:  Jerel Shepherd   No chief complaint on file.   History of Present Illness: Leonard Edwards is a 64 y.o. male who presents today for a 6 month check. Seen for Dr. Burt Knack.  He has a history of CAD, HLDand hypertension. He initially presented several years ago with acute coronary syndrome and underwent PCI to right coronary artery in 2006. He developed recurrent anginal symptoms and underwent stenting of the ramus intermedius branch in 2011. He ultimately was referred for CABG in 2017 - he [place] to LAD, free RIMA to ramus and SVG to PD in March of 2017.He had recurrent angina in September of 2017 - ended up getting3 DES to the LCX due to new critical stenosis of the distal LCX - his grafts were open. Recommended to have life long DAPT.He has had long standing issues with trying to adhere to CV risk factor modification.  He was last seen by Dr. Burt Knack in December of 2018 - doing well.I have seen him sporadically since - he has had gallbladder issues and ended up having cholecystectomy without issue. We have increased his BP medicines. He [place] much of [place] the beach. Last seen in January - was feeling good - no chest pain. BP high. Using Indocin. Trying to get back to exercise.   Comes in today. Here with   Past Medical History:  Diagnosis Date  . Benign prostatic hypertrophy   . CAD (coronary artery disease)    a. DES to distal RCA in 09/2004; DES to Ramus Intermediate 10/2009 following abnormal stress test b. 9/27 PCI with DES x3 to LCrx  . Chronic bronchitis ([place])    "in the spring"  . Esophageal stricture   . Exertional angina ([place]) 10/31/2015  . Fibromyalgia   . Gastric polyp   . GERD (gastroesophageal reflux disease)   . Hiatal hernia   . High cholesterol   . History of gout   . Hyperlipidemia     . Hypertension   . Obesity   . Pneumonia    hx of    Past Surgical History:  Procedure Laterality Date  . CARDIAC CATHETERIZATION N/A 03/29/2015   Procedure: Left Heart Cath and Coronary Angiography;  Surgeon: Sherren Mocha, MD;  Location: [facility];  Service: Cardiovascular;  Laterality: N/A;  . CARDIAC CATHETERIZATION N/A 10/31/2015   Procedure: Left Heart Cath and Cors/Grafts Angiography;  Surgeon: Sherren Mocha, MD;  Location: [facility];  Service: Cardiovascular;  Laterality: N/A;  . CARDIAC CATHETERIZATION N/A 10/31/2015   Procedure: Coronary Stent Intervention;  Surgeon: Sherren Mocha, MD;  Location: [facility];  Service: Cardiovascular;  Laterality: N/A;  . CHOLECYSTECTOMY N/A 07/07/2017   Procedure: LAPAROSCOPIC CHOLECYSTECTOMY WITH INTRAOPERATIVE CHOLANGIOGRAM;  Surgeon: Excell Seltzer, MD;  [facility];  Service: General;  Laterality: N/A;  . COLONOSCOPY W/ POLYPECTOMY    . CORONARY ANGIOPLASTY WITH STENT PLACEMENT  09/2004-10/2009   DES to distal RCA in 09/2004; DES to Ramus Intermediate 10/2009 following abnormal stress test  . CORONARY ARTERY BYPASS GRAFT N/A 04/06/2015   Procedure: CORONARY ARTERY BYPASS GRAFTING (CABG) x three,  using bilateral mammaries, and right leg greater saphenous vein harvested endoscopically;  Surgeon: Gaye Pollack, MD;  Location: [facility];  Service: Open Heart Surgery;  Laterality: N/A;  . ESOPHAGOGASTRODUODENOSCOPY (  EGD) WITH ESOPHAGEAL DILATION    . SHOULDER ARTHROSCOPY Right 2015   scar tissue  . TEE WITHOUT CARDIOVERSION N/A 04/06/2015   Procedure: TRANSESOPHAGEAL ECHOCARDIOGRAM (TEE);  Surgeon: Gaye Pollack, MD;  Location: [facility];  Service: Open Heart Surgery;  Laterality: N/A;  . ULTRASOUND GUIDANCE FOR VASCULAR ACCESS  03/29/2015   Procedure: Ultrasound Guidance For Vascular Access;  Surgeon: Sherren Mocha, MD;  Location: [facility];  Service: Cardiovascular;;  . UMBILICAL HERNIA REPAIR N/A 07/07/2017    Procedure: HERNIA REPAIR UMBILICAL ADULT;  Surgeon: Excell Seltzer, MD;  [facility];  Service: General;  Laterality: N/A;     Medications: No outpatient medications have been marked as taking for the 08/24/19 encounter (Appointment) with Burtis Junes, NP.     Allergies: No Known Allergies  Social History: The patient  reports that he has never smoked. He has never used smokeless tobacco. He reports current alcohol use of about 5.0 standard drinks of alcohol per week. He reports that he does not use drugs.   Family History: The patient's ***family history includes COPD in his mother; Diabetes type II in his sister; Heart attack in his father; Lupus in his sister.   Review of Systems: Please see the history of present illness.   All other systems are reviewed and negative.   Physical Exam: VS:  There were no vitals taken for this visit. Marland Kitchen  BMI There is no height or weight on file to calculate BMI.  Wt Readings from Last 3 Encounters:  02/22/19 263 lb (119.3 kg)  07/20/18 248 lb (112.5 kg)  12/08/17 251 lb 12.8 oz (114.2 kg)    General: Pleasant. Well developed, well nourished and in no acute distress.   HEENT: Normal.  Neck: Supple, no JVD, carotid bruits, or masses noted.  Cardiac: ***Regular rate and rhythm. No murmurs, rubs, or gallops. No edema.  Respiratory:  Lungs are clear to auscultation bilaterally with normal work of breathing.  GI: Soft and nontender.  MS: No deformity or atrophy. Gait and ROM intact.  Skin: Warm and dry. Color is normal.  Neuro:  Strength and sensation are intact and no gross focal deficits noted.  Psych: Alert, appropriate and with normal affect.   LABORATORY DATA:  EKG:  EKG {ACTION; IS/IS BOF:[id number]} ordered today.  Personally reviewed by me. This demonstrates ***.  Lab Results  Component Value Date   WBC 11.0 (H) 02/22/2019   HGB 16.0 02/22/2019   HCT 44.7 02/22/2019   PLT 323 02/22/2019   GLUCOSE 91 02/22/2019   CHOL  109 02/22/2019   TRIG 183 (H) 02/22/2019   HDL 43 02/22/2019   LDLDIRECT 68.2 10/25/2010   LDLCALC 36 02/22/2019   ALT 44 02/22/2019   AST 34 02/22/2019   NA 141 02/22/2019   K 4.9 02/22/2019   CL 102 02/22/2019   CREATININE 0.80 02/22/2019   BUN 15 02/22/2019   CO2 26 02/22/2019   TSH 1.81 07/17/2014   PSA 1.03 08/29/2009   INR 1.1 10/29/2015   HGBA1C 5.9 (H) 04/04/2015     BNP (last 3 results) No results for input(s): BNP in the last 8760 hours.  ProBNP (last 3 results) No results for input(s): PROBNP in the last 8760 hours.   Other Studies Reviewed Today:  Cardiac Cath 10-31-2015: Conclusion     Prox LAD lesion, 90 %stenosed.  1. Severe native three-vessel coronary artery disease with severe distal left main stenosis, severe ostial LAD stenosis, severe diffuse left circumflex  stenosis, and severe right PDA stenosis  2. Status post CABG with continued patency of the LIMA to LAD, free RIMA to ramus intermedius, and SVG to right PDA  3. 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      ASSESSMENT & PLAN:   1. CAD - prior multiple interventions, then CABG x 3 in 2017 and then PCI to the LCX in 10/2015 - no active symptoms - needs aggressive risk factor modification which has always been a challenge for him.   2. HTN - just took his medicines - he says he has better control at home - he is to monitor more over the next few days. We will call him for an update early next week. He has tended to only take his ACE once a day. I worry about overall compliance.   3. HLD - on statin - lab today.   4. Obesity - weight is up - he admits to eating more - needs to really work on this and he is aware.     Current medicines are reviewed with the patient  today.  The patient does not have concerns regarding medicines other than what has been noted above.  The following changes have been made:  See above.  Labs/ tests ordered today include:   No orders of the defined types were placed in this encounter.    Disposition:   FU with *** in {gen number [id number]} {Days to years:10300}.   Patient is agreeable to this plan and will call if any problems develop in the interim.   SignedTruitt Merle, NP  08/16/2019 9:30 AM  [employer] [address] [place], [place]  [address] Phone: [phone] Fax: [phone]

## 2019-08-17 DIAGNOSIS — S83241A Other tear of medial meniscus, current injury, right knee, initial encounter: Secondary | ICD-10-CM | POA: Diagnosis not present

## 2019-08-23 DIAGNOSIS — S83241D Other tear of medial meniscus, current injury, right knee, subsequent encounter: Secondary | ICD-10-CM | POA: Diagnosis not present

## 2019-08-24 ENCOUNTER — Ambulatory Visit: Payer: BC Managed Care – PPO | Admitting: Nurse Practitioner

## 2019-08-24 ENCOUNTER — Telehealth: Payer: Self-pay | Admitting: Nurse Practitioner
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
## 2019-09-13 DIAGNOSIS — S83241D Other tear of medial meniscus, current injury, right knee, subsequent encounter: Secondary | ICD-10-CM | POA: Diagnosis not present
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
## 2019-10-04 ENCOUNTER — Encounter: Payer: Self-pay | Admitting: Nurse Practitioner

## 2019-10-04 ENCOUNTER — Other Ambulatory Visit: Payer: Self-pay

## 2019-10-04 ENCOUNTER — Ambulatory Visit (INDEPENDENT_AMBULATORY_CARE_PROVIDER_SITE_OTHER): Payer: BC Managed Care – PPO | Admitting: Nurse Practitioner

## 2019-10-04 VITALS — BP 126/80 | HR 60 | Ht 70.5 in | Wt 252.2 lb

## 2019-10-04 DIAGNOSIS — I2510 Atherosclerotic heart disease of native coronary artery without angina pectoris: Secondary | ICD-10-CM | POA: Diagnosis not present

## 2019-10-04 DIAGNOSIS — E782 Mixed hyperlipidemia: Secondary | ICD-10-CM

## 2019-10-04 DIAGNOSIS — Z951 Presence of aortocoronary bypass graft: Secondary | ICD-10-CM | POA: Diagnosis not present

## 2019-10-04 DIAGNOSIS — I10 Essential (primary) hypertension: Secondary | ICD-10-CM

## 2019-10-04 DIAGNOSIS — E785 Hyperlipidemia, unspecified: Secondary | ICD-10-CM

## 2019-10-04 DIAGNOSIS — S83241D Other tear of medial meniscus, current injury, right knee, subsequent encounter: Secondary | ICD-10-CM | POA: Diagnosis not present

## 2019-10-04 MED ORDER — CLOPIDOGREL BISULFATE 75 MG PO TABS: 75.0000 mg | ORAL_TABLET | Freq: Every day | ORAL | 3 refills | Status: DC

## 2019-10-04 MED ORDER — METOPROLOL SUCCINATE ER 50 MG PO TB24: ORAL_TABLET | ORAL | 3 refills | Status: DC

## 2019-10-04 MED ORDER — LISINOPRIL 20 MG PO TABS: ORAL_TABLET | ORAL | 3 refills | Status: DC

## 2019-10-04 MED ORDER — HYDROCHLOROTHIAZIDE 25 MG PO TABS: ORAL_TABLET | ORAL | 3 refills | Status: DC

## 2019-10-04 NOTE — Patient Instructions (Addendum)
After Visit Summary:  We will be checking the following labs today - BMET, CBC, HPF, Lipids, TSH and A1C  Medication Instructions:    Continue with your current medicines.   I did send in some of your refills today.    If you need a refill on your cardiac medications before your next appointment, please call your pharmacy.     Testing/Procedures To Be Arranged:  N/A  Follow-Up:   See Dr. Burt Knack in 6 months    At [facility], you and your health needs are our priority.  As part of our continuing mission to provide you with exceptional heart care, we have created designated Provider Care Teams.  These Care Teams include your primary Cardiologist (physician) and Advanced Practice Providers (APPs -  Physician Assistants and Nurse Practitioners) who all work together to provide you with the care you need, when you need it.  Special Instructions:  . Stay safe, wash your hands for at least 20 seconds and wear a mask when needed.  . It was good to talk with you today.    Call the [place] office at [phone] if you have any questions, problems or concerns.

## 2019-10-05 ENCOUNTER — Other Ambulatory Visit: Payer: Self-pay | Admitting: *Deleted

## 2019-10-05 DIAGNOSIS — E785 Hyperlipidemia, unspecified: Secondary | ICD-10-CM

## 2019-10-05 LAB — BASIC METABOLIC PANEL
BUN/Creatinine Ratio: 22 (ref 10–24)
BUN: 17 mg/dL (ref 8–27)
CO2: 23 mmol/L (ref 20–29)
Calcium: 9.9 mg/dL (ref 8.6–10.2)
Chloride: 102 mmol/L (ref 96–106)
Creatinine, Ser: 0.77 mg/dL (ref 0.76–1.27)
GFR calc Af Amer: 112 mL/min/{1.73_m2} (ref >59)
GFR calc non Af Amer: 97 mL/min/{1.73_m2} (ref >59)
Glucose: 106 mg/dL — ABNORMAL HIGH (ref 65–99)
Potassium: 4.1 mmol/L (ref 3.5–5.2)
Sodium: 142 mmol/L (ref 134–144)

## 2019-10-05 LAB — HEPATIC FUNCTION PANEL
ALT: 37 IU/L (ref 0–44)
AST: 24 IU/L (ref 0–40)
Albumin: 4.6 g/dL (ref 3.8–4.8)
Alkaline Phosphatase: 95 IU/L (ref 48–121)
Bilirubin Total: 0.8 mg/dL (ref 0.0–1.2)
Bilirubin, Direct: 0.22 mg/dL (ref 0.00–0.40)
Total Protein: 7.7 g/dL (ref 6.0–8.5)

## 2019-10-05 LAB — CBC
Hematocrit: 46.3 % (ref 37.5–51.0)
Hemoglobin: 16.4 g/dL (ref 13.0–17.7)
MCH: 32.1 pg (ref 26.6–33.0)
MCHC: 35.4 g/dL (ref 31.5–35.7)
MCV: 91 fL (ref 79–97)
Platelets: 365 10*3/uL (ref 150–450)
RBC: 5.11 x10E6/uL (ref 4.14–5.80)
RDW: 12.9 % (ref 11.6–15.4)
WBC: 10.5 10*3/uL (ref 3.4–10.8)

## 2019-10-05 LAB — HEMOGLOBIN A1C
Est. average glucose Bld gHb Est-mCnc: 117 mg/dL
Hgb A1c MFr Bld: 5.7 % — ABNORMAL HIGH (ref 4.8–5.6)

## 2019-10-05 LAB — LIPID PANEL
Chol/HDL Ratio: 4 ratio (ref 0.0–5.0)
Cholesterol, Total: 168 mg/dL (ref 100–199)
HDL: 42 mg/dL (ref >39)
LDL Chol Calc (NIH): 99 mg/dL (ref 0–99)
Triglycerides: 153 mg/dL — ABNORMAL HIGH (ref 0–149)
VLDL Cholesterol Cal: 27 mg/dL (ref 5–40)

## 2019-10-05 LAB — TSH: TSH: 1.44 u[IU]/mL (ref 0.450–4.500)

## 2019-10-12 DIAGNOSIS — M6281 Muscle weakness (generalized): Secondary | ICD-10-CM | POA: Diagnosis not present

## 2019-10-12 DIAGNOSIS — M25561 Pain in right knee: Secondary | ICD-10-CM | POA: Diagnosis not present

## 2019-10-19 DIAGNOSIS — M6281 Muscle weakness (generalized): Secondary | ICD-10-CM | POA: Diagnosis not present

## 2019-10-19 DIAGNOSIS — M25561 Pain in right knee: Secondary | ICD-10-CM | POA: Diagnosis not present

## 2019-10-25 DIAGNOSIS — S83241D Other tear of medial meniscus, current injury, right knee, subsequent encounter: Secondary | ICD-10-CM | POA: Diagnosis not present

## 2019-11-10 ENCOUNTER — Other Ambulatory Visit: Payer: Self-pay | Admitting: Nurse Practitioner

## 2019-11-16 ENCOUNTER — Other Ambulatory Visit: Payer: BC Managed Care – PPO | Admitting: *Deleted

## 2019-11-16 ENCOUNTER — Telehealth: Payer: Self-pay

## 2019-11-16 ENCOUNTER — Other Ambulatory Visit: Payer: Self-pay

## 2019-11-16 DIAGNOSIS — E785 Hyperlipidemia, unspecified: Secondary | ICD-10-CM | POA: Diagnosis not present

## 2019-11-16 LAB — HEPATIC FUNCTION PANEL
ALT: 31 IU/L (ref 0–44)
AST: 24 IU/L (ref 0–40)
Albumin: 4.4 g/dL (ref 3.8–4.8)
Alkaline Phosphatase: 89 IU/L (ref 44–121)
Bilirubin Total: 0.6 mg/dL (ref 0.0–1.2)
Bilirubin, Direct: 0.22 mg/dL (ref 0.00–0.40)
Total Protein: 7.2 g/dL (ref 6.0–8.5)

## 2019-11-16 LAB — LIPID PANEL
Chol/HDL Ratio: 2.7 ratio (ref 0.0–5.0)
Cholesterol, Total: 108 mg/dL (ref 100–199)
HDL: 40 mg/dL (ref >39)
LDL Chol Calc (NIH): 44 mg/dL (ref 0–99)
Triglycerides: 136 mg/dL (ref 0–149)
VLDL Cholesterol Cal: 24 mg/dL (ref 5–40)

## 2019-11-16 NOTE — Telephone Encounter (Signed)
-----   Message from Burtis Junes, NP sent at 11/16/2019  4:01 PM EDT ----- Ok to report. His labs look much better! Needs to stay on his current regimen.

## 2019-11-16 NOTE — Telephone Encounter (Signed)
Attempted to call the patient with lab results. Unable to leave a message due to the voicemail box being full.

## 2019-12-19 DIAGNOSIS — N5201 Erectile dysfunction due to arterial insufficiency: Secondary | ICD-10-CM | POA: Diagnosis not present

## 2019-12-19 DIAGNOSIS — E291 Testicular hypofunction: Secondary | ICD-10-CM | POA: Diagnosis not present

## 2019-12-26 DIAGNOSIS — I25810 Atherosclerosis of coronary artery bypass graft(s) without angina pectoris: Secondary | ICD-10-CM | POA: Diagnosis not present

## 2020-01-16 DIAGNOSIS — S83241D Other tear of medial meniscus, current injury, right knee, subsequent encounter: Secondary | ICD-10-CM | POA: Diagnosis not present

## 2020-01-19 DIAGNOSIS — E291 Testicular hypofunction: Secondary | ICD-10-CM | POA: Diagnosis not present

## 2020-01-23 DIAGNOSIS — M25561 Pain in right knee: Secondary | ICD-10-CM | POA: Diagnosis not present

## 2020-02-08 DIAGNOSIS — E291 Testicular hypofunction: Secondary | ICD-10-CM | POA: Diagnosis not present

## 2020-02-23 ENCOUNTER — Telehealth: Payer: Self-pay | Admitting: Cardiovascular Disease

## 2020-02-23 NOTE — Telephone Encounter (Signed)
Patient tested positive for covid and wants to know if there are any do's and dont's he should know about. Please advise.

## 2020-02-23 NOTE — Telephone Encounter (Signed)
[name] reporting he has tested positive for Covid. He has a cough but feels mostly OK. He has called his PCP. He is already taking Zinc and melatonin and has ordered Vit C and Vit D.  He is vaccinated and has his booster. Encouraged him to stay hydrated and active to keep lungs strong. He is getting a pulse ox to measure oxygen.  Instructed him to call PCP if symptoms worsen and to take Tylenol PRN and mouthwash in the meantime.  He was grateful for call and agrees with plan.

## 2020-02-24 ENCOUNTER — Telehealth: Payer: Self-pay | Admitting: Cardiovascular Disease

## 2020-02-24 NOTE — Telephone Encounter (Signed)
The patient's daughter calls today asking about qualifications for antibody infusions.  Directed her to call Dr. Brigitte Pulse to discuss qualifications and possible treatment.  Provided her Dr. Raul Del phone number. She was grateful for call and agrees with plan.

## 2020-02-24 NOTE — Telephone Encounter (Signed)
New Message:     Daughter wants to know if pt qualify for the Antibodies Infusion? He have so many conditions that should qualify him for this. Pt have COVID at this time.

## 2020-02-25 ENCOUNTER — Other Ambulatory Visit: Payer: Self-pay | Admitting: Physician Assistant

## 2020-02-25 ENCOUNTER — Ambulatory Visit (HOSPITAL_COMMUNITY)
Admission: RE | Admit: 2020-02-25 | Discharge: 2020-02-25 | Disposition: A | Discharge status: Home / Self Care | Payer: BC Managed Care – PPO | Source: Ambulatory Visit | Attending: Pulmonary Disease | Admitting: Pulmonary Disease

## 2020-02-25 ENCOUNTER — Telehealth (HOSPITAL_COMMUNITY): Payer: Self-pay | Admitting: Pharmacist

## 2020-02-25 DIAGNOSIS — N189 Chronic kidney disease, unspecified: Secondary | ICD-10-CM

## 2020-02-25 DIAGNOSIS — U071 COVID-19: Secondary | ICD-10-CM | POA: Insufficient documentation

## 2020-02-25 LAB — BASIC METABOLIC PANEL
Anion gap: 8 (ref 5–15)
BUN: 8 mg/dL (ref 8–23)
CO2: 29 mmol/L (ref 22–32)
Calcium: 9.2 mg/dL (ref 8.9–10.3)
Chloride: 103 mmol/L (ref 98–111)
Creatinine, Ser: 0.84 mg/dL (ref 0.61–1.24)
GFR, Estimated: >60 mL/min (ref >60)
Glucose, Bld: 117 mg/dL — ABNORMAL HIGH (ref 70–99)
Potassium: 3.8 mmol/L (ref 3.5–5.1)
Sodium: 140 mmol/L (ref 135–145)

## 2020-02-25 MED ORDER — NIRMATRELVIR/RITONAVIR (PAXLOVID)TABLET: 3.0000 | ORAL_TABLET | Freq: Two times a day (BID) | ORAL | 0 refills | Status: AC

## 2020-02-25 MED FILL — PAXLOVID 20 X 150 MG & 10 X: 20 X 150 MG | 5 days supply | Qty: 30 | Fill #0

## 2020-02-25 NOTE — Telephone Encounter (Signed)
Patient was prescribed oral covid treatment PAXLOVID and treatment note was reviewed. Medication has been received by [place] and reviewed for appropriateness.  Drug Interactions or Dosage Adjustments Noted: Patients CrCl was calculated to be 154.  Practitioner is doing a repeat to ensure patient is dehydrated.  Medications were reviewed and patient should avoid taking colchicine, sildenafil and fluticasone during therapy.  Patient was also advised to monitor BP, potential increase in serum concentration with amlodipine.  Delivery Method: patient will pick up  Patient contacted for counseling on 02/25/2020 and verbalized understanding.   Delivery or Pick-Up Date: 02/25/2020  Deidre Ala 02/25/2020, 10:29 AM [facility] Pharmacist Phone# [phone]

## 2020-02-25 NOTE — Progress Notes (Signed)
Outpatient Oral COVID Treatment Note  I connected with Leonard Edwards on 02/25/2020/9:59 AM by telephone and verified that I am speaking with the correct person using two identifiers.  I discussed the limitations, risks, security, and privacy concerns of performing an evaluation and management service by telephone and the availability of in person appointments. I also discussed with the patient that there may be a patient responsible charge related to this service. The patient expressed understanding and agreed to proceed.  Patient location: home Provider location: home  Diagnosis: COVID-19 infection  Purpose of visit: Discussion of potential use of Molnupiravir or Paxlovid, a new treatment for mild to moderate COVID-19 viral infection in non-hospitalized patients.   Subjective: Patient is a 65 y.o. male who has been diagnosed with COVID 19 viral infection.  Their symptoms began on 1/19 with cough, body aches and chills.  Pt is fully vaccinated and boosted.   Past Medical History:  Diagnosis Date  . Benign prostatic hypertrophy   . CAD (coronary artery disease)    a. DES to distal RCA in 09/2004; DES to Ramus Intermediate 10/2009 following abnormal stress test b. 9/27 PCI with DES x3 to LCrx  . Chronic bronchitis ([place])    "in the spring"  . Esophageal stricture   . Exertional angina ([place]) 10/31/2015  . Fibromyalgia   . Gastric polyp   . GERD (gastroesophageal reflux disease)   . Hiatal hernia   . High cholesterol   . History of gout   . Hyperlipidemia   . Hypertension   . Obesity   . Pneumonia    hx of    No Known Allergies   Current Outpatient Medications:  .  nirmatrelvir/ritonavir EUA (PAXLOVID) TABS, Take 3 tablets by mouth 2 (two) times daily for 5 days. Take nirmatrelvir (150 mg) two  tablet(s) twice daily for 5 days and ritonavir (100 mg) one tablet twice daily for 5 days., Disp: 30 tablet, Rfl: 0 .  allopurinol (ZYLOPRIM) 300 MG tablet, Take 300 mg by mouth daily., Disp: ,  Rfl:  .  amLODipine (NORVASC) 10 MG tablet, TAKE 1 TABLET(10 MG) BY MOUTH DAILY AT BEDTIME, Disp: 90 tablet, Rfl: 3 .  aspirin EC 81 MG tablet, Take 81 mg by mouth daily., Disp: , Rfl:  .  clopidogrel (PLAVIX) 75 MG tablet, Take 1 tablet (75 mg total) by mouth daily with breakfast., Disp: 90 tablet, Rfl: 3 .  Coenzyme Q10 (CO Q 10) 100 MG CAPS, Take 100 mg by mouth daily.  , Disp: , Rfl:  .  colchicine 0.6 MG tablet, Take 0.6 mg by mouth as needed (gout flare up). , Disp: , Rfl:  .  diphenhydramine-acetaminophen (TYLENOL PM) 25-500 MG TABS tablet, Take 0.5 tablets by mouth at bedtime as needed (sleep)., Disp: , Rfl:  .  Evolocumab (REPATHA SURECLICK) [id number] MG/ML SOAJ, Inject 1 pen into the skin every 14 (fourteen) days., Disp: 2 pen, Rfl: 11 .  fluticasone (FLONASE) 50 MCG/ACT nasal spray, Place 2 sprays into both nostrils daily as needed for allergies or rhinitis., Disp: , Rfl:  .  hydrochlorothiazide (HYDRODIURIL) 25 MG tablet, TAKE 1 TABLET(25 MG) BY MOUTH DAILY, Disp: 90 tablet, Rfl: 3 .  indomethacin (INDOCIN) 50 MG capsule, TAKE 1 CAPSULE BY MOUTH THREE TIMES DAILY AS NEEDED, Disp: 90 capsule, Rfl: 3 .  levocetirizine (XYZAL) 5 MG tablet, Take 5 mg by mouth daily as needed for allergies., Disp: , Rfl:  .  lisinopril (ZESTRIL) 20 MG tablet, TAKE 1 TABLET(20 MG)  BY MOUTH DAILY, Disp: 90 tablet, Rfl: 3 .  metoprolol succinate (TOPROL-XL) 50 MG 24 hr tablet, TAKE 1 TABLET BY MOUTH EVERY DAY, Disp: 90 tablet, Rfl: 3 .  montelukast (SINGULAIR) 10 MG tablet, Take 10 mg by mouth at bedtime., Disp: , Rfl:  .  nitroGLYCERIN (NITROSTAT) 0.4 MG SL tablet, Place 1 tablet (0.4 mg total) under the tongue every 5 (five) minutes as needed for chest pain., Disp: 25 tablet, Rfl: 1 .  pantoprazole (PROTONIX) 40 MG tablet, TAKE 1 TABLET(40 MG) BY MOUTH DAILY, Disp: 90 tablet, Rfl: 3 .  PROAIR HFA 108 (90 Base) MCG/ACT inhaler, Inhale 1 puff into the lungs every 4 (four) hours as needed for wheezing (allergies). ,  Disp: , Rfl:  .  sildenafil (VIAGRA) 100 MG tablet, Take 100 mg by mouth as needed., Disp: , Rfl:   Objective: Patient in no apparent distress.  Breathing is non labored.  Mood and behavior are normal.  Laboratory Data:  No results found for this or any previous visit (from the past 2160 hour(s)).   Assessment: 65 y.o. male with mild/moderate COVID 19 viral infection diagnosed on 1/19 at high risk for progression to severe COVID 19.  Plan:  This patient is a 65 y.o. male that meets the following criteria for Emergency Use Authorization of: Paxlovid 1. Age >12 yr AND > 40 kg 2. SARS-COV-2 positive test 3. Symptom onset < 5 days 4. Mild-to-moderate COVID disease with high risk for severe progression to hospitalization or death  I have spoken and communicated the following to the patient or parent/caregiver regarding: 1. Paxlovid is an unapproved drug that is authorized for use under an Emergency Use Authorization.  2. There are no adequate, approved, available products for the treatment of COVID-19 in adults who have mild-to-moderate COVID-19 and are at high risk for progressing to severe COVID-19, including hospitalization or death. 3. Other therapeutics are currently authorized. For additional information on all products authorized for treatment or prevention of COVID-19, please see [employer].  4. There are benefits and risks of taking this treatment as outlined in the "Fact Sheet for Patients and Caregivers."  5. "Fact Sheet for Patients and Caregivers" was reviewed with patient. A hard copy will be provided to patient from pharmacy prior to the patient receiving treatment. 6. Patients should continue to self-isolate and use infection control measures (e.g., wear mask, isolate, social distance, avoid sharing personal items, clean and disinfect "high touch" surfaces, and frequent  handwashing) according to CDC guidelines.  7. The patient or parent/caregiver has the option to accept or refuse treatment. 8. Patient medication history was reviewed for potential drug interactions:Interaction with home meds: fluticasone, cochicine and viagra  9. Patient's creatinine clearance was calculated to be 154, and they were therefore prescribed Normal dose (CrCl>60) - nirmatrelvir 150mg  tab (2 tablet) by mouth twice daily AND ritonavir 100mg  tab (1 tablet) by mouth twice daily This was based on labs in august. Will bring him in to recheck today.    After reviewing above information with the patient, the patient agrees to receive molnupiravir.  Follow up instructions:    . Take prescription BID x 5 days as directed . Reach out to pharmacist for counseling on medication if desired . For concerns regarding further COVID symptoms please follow up with your PCP or urgent care . For urgent or life-threatening issues, seek care at your local emergency department  The patient was provided an opportunity to ask questions, and all were answered. The patient  agreed with the plan and demonstrated an understanding of the instructions.   Script sent to [facility] and opted to pick up RX.  The patient was advised to call their PCP or seek an in-person evaluation if the symptoms worsen or if the condition fails to improve as anticipated.   I provided 20 minutes of non face-to-face telephone visit time during this encounter, and > 50% was spent counseling as documented under my assessment & plan.  Angelena Form, PA-C 02/25/2020 /9:59 AM

## 2020-02-27 ENCOUNTER — Other Ambulatory Visit: Payer: Self-pay | Admitting: Nurse Practitioner

## 2020-03-13 ENCOUNTER — Telehealth: Payer: Self-pay | Admitting: *Deleted

## 2020-03-13 DIAGNOSIS — S83241A Other tear of medial meniscus, current injury, right knee, initial encounter: Secondary | ICD-10-CM | POA: Diagnosis not present

## 2020-03-13 NOTE — Telephone Encounter (Signed)
   [facility] Pre-operative Risk Assessment    HEARTCARE STAFF: - Please ensure there is not already an duplicate clearance open for this procedure. - Under Visit Info/Reason for Call, type in Other and utilize the format Clearance MM/DD/YY or Clearance TBD. Do not use dashes or single digits. - If request is for dental extraction, please clarify the # of teeth to be extracted.  Request for surgical clearance:  1. What type of surgery is being performed? RIGHT KNEE SCOPE   2. When is this surgery scheduled? TBD   3. What type of clearance is required (medical clearance vs. Pharmacy clearance to hold med vs. Both)? MEDICAL  4. Are there any medications that need to be held prior to surgery and how long? ASA AND PLLAVIX   5. Practice name and name of physician performing surgery? [facility]; DR. Elsie Saas   6. What is the office phone number? [phone]   7.   What is the office fax number? [place].   Anesthesia type (None, local, MAC, general) ? CHOICE   Julaine Hua 03/13/2020, 3:48 PM  _________________________________________________________________   (provider comments below)

## 2020-03-13 NOTE — Telephone Encounter (Signed)
   Primary Cardiologist: No primary care provider on file.  Chart reviewed as part of pre-operative protocol coverage. Because of Jariah M Diviney's past medical history and time since last visit, he will require a follow-up visit in order to better assess preoperative cardiovascular risk.  Pt has an appt with Dr. Burt Knack on 03/21/20. Dr. Burt Knack will address preop and antiplatet hold at that time.  Pre-op covering staff: - Please schedule appointment and call patient to inform them. If patient already had an upcoming appointment within acceptable timeframe, please add "pre-op clearance" to the appointment notes so provider is aware. - Please contact requesting surgeon's office via preferred method (i.e, phone, fax) to inform them of need for appointment prior to surgery.  If applicable, this message will also be routed to pharmacy pool and/or primary cardiologist for input on holding anticoagulant/antiplatelet agent as requested below so that this information is available to the clearing provider at time of patient's appointment.   Ledora Bottcher, PA  03/13/2020, 4:50 PM

## 2020-03-13 NOTE — Telephone Encounter (Signed)
Notes added to appt notes. I will send clearance notes to MD for upcoming appt next week.

## 2020-03-21 ENCOUNTER — Ambulatory Visit: Payer: BC Managed Care – PPO | Admitting: Cardiovascular Disease

## 2020-03-21 NOTE — Progress Notes (Deleted)
Cardiology Office Note:    Date:  03/21/2020   ID:  Leonard Edwards, DOB [birth year], MRN [id number]  PCP:  Leonard Edwards, [facility]  Cardiologist:  Sherren Mocha, MD *** Advanced Practice Provider:  No care team member to display Electrophysiologist:  None  {Press F2 to show EP APP, CHF, sleep or structural heart MD               :[id number]}  { Click here to update then REFRESH NOTE - MD (PCP) or APP (Team Member)  Change PCP Type for MD, Specialty for APP is either Cardiology or Clinical Cardiac Electrophysiology  :[id number]}   Referring MD: Leonard Redwood, MD   No chief complaint on file.   History of Present Illness:    Leonard Edwards is a 65 y.o. male with a hx of coronary artery disease, presenting for follow-up evaluation.  The patient has undergone PCI procedures in 2006 and 2011.  He developed progressive angina and was found to have severe multivessel disease in 2017 when he was referred for multivessel CABG.  He was treated with a LIMA to LAD, free RIMA to ramus, and saphenous vein graft to PDA.  He had recurrent angina later that year and underwent stenting of the native circumflex.  Bypass grafts remained patent at that time.  The patient will require meniscus repair and presents today for preoperative cardiovascular evaluation.    Past Medical History:  Diagnosis Date  . Benign prostatic hypertrophy   . CAD (coronary artery disease)    a. DES to distal RCA in 09/2004; DES to Ramus Intermediate 10/2009 following abnormal stress test b. 9/27 PCI with DES x3 to LCrx  . Chronic bronchitis ([place])    "in the spring"  . Esophageal stricture   . Exertional angina ([place]) 10/31/2015  . Fibromyalgia   . Gastric polyp   . GERD (gastroesophageal reflux disease)   . Hiatal hernia   . High cholesterol   . History of gout   . Hyperlipidemia   . Hypertension   . Obesity   . Pneumonia    hx of    Past Surgical History:  Procedure Laterality Date   . CARDIAC CATHETERIZATION N/A 03/29/2015   Procedure: Left Heart Cath and Coronary Angiography;  Surgeon: Sherren Mocha, MD;  Location: [facility];  Service: Cardiovascular;  Laterality: N/A;  . CARDIAC CATHETERIZATION N/A 10/31/2015   Procedure: Left Heart Cath and Cors/Grafts Angiography;  Surgeon: Sherren Mocha, MD;  Location: [facility];  Service: Cardiovascular;  Laterality: N/A;  . CARDIAC CATHETERIZATION N/A 10/31/2015   Procedure: Coronary Stent Intervention;  Surgeon: Sherren Mocha, MD;  Location: [facility];  Service: Cardiovascular;  Laterality: N/A;  . CHOLECYSTECTOMY N/A 07/07/2017   Procedure: LAPAROSCOPIC CHOLECYSTECTOMY WITH INTRAOPERATIVE CHOLANGIOGRAM;  Surgeon: Excell Seltzer, MD;  Location: [facility];  Service: General;  Laterality: N/A;  . COLONOSCOPY W/ POLYPECTOMY    . CORONARY ANGIOPLASTY WITH STENT PLACEMENT  09/2004-10/2009   DES to distal RCA in 09/2004; DES to Ramus Intermediate 10/2009 following abnormal stress test  . CORONARY ARTERY BYPASS GRAFT N/A 04/06/2015   Procedure: CORONARY ARTERY BYPASS GRAFTING (CABG) x three,  using bilateral mammaries, and right leg greater saphenous vein harvested endoscopically;  Surgeon: Gaye Pollack, MD;  Location: [facility];  Service: Open Heart Surgery;  Laterality: N/A;  . ESOPHAGOGASTRODUODENOSCOPY (EGD) WITH ESOPHAGEAL DILATION    . SHOULDER ARTHROSCOPY Right 2015   scar tissue  .  TEE WITHOUT CARDIOVERSION N/A 04/06/2015   Procedure: TRANSESOPHAGEAL ECHOCARDIOGRAM (TEE);  Surgeon: Gaye Pollack, MD;  Location: [facility];  Service: Open Heart Surgery;  Laterality: N/A;  . ULTRASOUND GUIDANCE FOR VASCULAR ACCESS  03/29/2015   Procedure: Ultrasound Guidance For Vascular Access;  Surgeon: Sherren Mocha, MD;  Location: [facility];  Service: Cardiovascular;;  . UMBILICAL HERNIA REPAIR N/A 07/07/2017   Procedure: HERNIA REPAIR UMBILICAL ADULT;  Surgeon: Excell Seltzer, MD;  Location: [facility];  Service: General;   Laterality: N/A;    Current Medications: No outpatient medications have been marked as taking for the 03/21/20 encounter (Appointment) with Sherren Mocha, MD.     Allergies:   Patient has no known allergies.   Social History   Socioeconomic History  . Marital status: Married    Spouse name: Not on file  . Number of children: 4  . Years of education: College  . Highest education level: Not on file  Occupational History    Employer: [employer]  Tobacco Use  . Smoking status: Never Smoker  . Smokeless tobacco: Never Used  Vaping Use  . Vaping Use: Never used  Substance and Sexual Activity  . Alcohol use: Yes    Alcohol/week: 5.0 standard drinks    Types: 5 Glasses of wine per week  . Drug use: No  . Sexual activity: Not Currently  Other Topics Concern  . Not on file  Social History Narrative  . Not on file   Social Determinants of Health   Financial Resource Strain: Not on file  Food Insecurity: Not on file  Transportation Needs: Not on file  Physical Activity: Not on file  Stress: Not on file  Social Connections: Not on file     Family History: The patient's ***family history includes COPD in his mother; Diabetes type II in his sister; Heart attack in his father; Lupus in his sister. There is no history of Colon cancer, Pancreatic cancer, or Stomach cancer.  ROS:   Please see the history of present illness.    *** All other systems reviewed and are negative.  EKGs/Labs/Other Studies Reviewed:    The following studies were reviewed today: ***  EKG:  EKG is *** ordered today.  The ekg ordered today demonstrates ***  Recent Labs: 10/04/2019: Hemoglobin 16.4; Platelets 365; TSH 1.440 11/16/2019: ALT 31 02/25/2020: BUN 8; Creatinine, Ser 0.84; Potassium 3.8; Sodium 140  Recent Lipid Panel    Component Value Date/Time   CHOL 108 11/16/2019 0901   TRIG 136 11/16/2019 0901   TRIG 147 02/10/2006 0856   HDL 40 11/16/2019 0901   CHOLHDL 2.7 11/16/2019 0901    CHOLHDL 3.9 12/19/2015 0906   VLDL 27 12/19/2015 0906   LDLCALC 44 11/16/2019 0901   LDLDIRECT 68.2 10/25/2010 1015     Risk Assessment/Calculations:   {Does this patient have ATRIAL FIBRILLATION?:[id number]}   Physical Exam:    VS:  There were no vitals taken for this visit.    Wt Readings from Last 3 Encounters:  10/04/19 252 lb 3.2 oz (114.4 kg)  02/22/19 263 lb (119.3 kg)  07/20/18 248 lb (112.5 kg)     GEN: *** Well nourished, well developed in no acute distress HEENT: Normal NECK: No JVD; No carotid bruits LYMPHATICS: No lymphadenopathy CARDIAC: ***RRR, no murmurs, rubs, gallops RESPIRATORY:  Clear to auscultation without rales, wheezing or rhonchi  ABDOMEN: Soft, non-tender, non-distended MUSCULOSKELETAL:  No edema; No deformity  SKIN: Warm and dry NEUROLOGIC:  Alert and oriented  x 3 PSYCHIATRIC:  Normal affect   ASSESSMENT:    1. Coronary artery disease involving native coronary artery of native heart with angina pectoris ([place])   2. Mixed hyperlipidemia   3. Essential hypertension    PLAN:    In order of problems listed above:  1. ***   {Are you ordering a CV Procedure (e.g. stress test, cath, DCCV, TEE, etc)?   Press F2        :[id number]}    Medication Adjustments/Labs and Tests Ordered: Current medicines are reviewed at length with the patient today.  Concerns regarding medicines are outlined above.  No orders of the defined types were placed in this encounter.  No orders of the defined types were placed in this encounter.   There are no Patient Instructions on file for this visit.   Signed, Sherren Mocha, MD  03/21/2020 8:34 AM    [place]

## 2020-03-27 NOTE — Telephone Encounter (Signed)
Our office received another clearance request. I called the pt as I saw he did not show for his pre op appt with Dr. Burt Knack. I offered pt an appt at our [place] office 03/29/20 with Coletta Memos, FNP @ 1:45 for pre op clearance. I will send clearance notes to FNP for upcoming appt. Pt is agreeable to plan of care to be seen at the [facility] office. Pt aware of where [facility] office is at. Pt thanked me for the call and the help.
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
## 2020-03-29 ENCOUNTER — Ambulatory Visit (INDEPENDENT_AMBULATORY_CARE_PROVIDER_SITE_OTHER): Payer: BC Managed Care – PPO | Admitting: General Practice

## 2020-03-29 ENCOUNTER — Other Ambulatory Visit: Payer: Self-pay

## 2020-03-29 ENCOUNTER — Encounter: Payer: Self-pay | Admitting: General Practice

## 2020-03-29 VITALS — BP 146/82 | HR 52 | Wt 252.8 lb

## 2020-03-29 DIAGNOSIS — I2510 Atherosclerotic heart disease of native coronary artery without angina pectoris: Secondary | ICD-10-CM | POA: Diagnosis not present

## 2020-03-29 DIAGNOSIS — Z01810 Encounter for preprocedural cardiovascular examination: Secondary | ICD-10-CM

## 2020-03-29 DIAGNOSIS — I10 Essential (primary) hypertension: Secondary | ICD-10-CM | POA: Diagnosis not present

## 2020-03-29 DIAGNOSIS — E785 Hyperlipidemia, unspecified: Secondary | ICD-10-CM

## 2020-03-29 NOTE — Patient Instructions (Signed)
Medication Instructions:  The current medical regimen is effective;  continue present plan and medications as directed. Please refer to the Current Medication list given to you today.  *If you need a refill on your cardiac medications before your next appointment, please call your pharmacy*  Lab Work:   Testing/Procedures:  NONE    NONE  Special Instructions PLEASE READ AND FOLLOW SALTY 6-ATTACHED-1,800mg  daily  PLEASE INCREASE PHYSICAL ACTIVITY AS TOLERATED  Follow-Up: Your next appointment:  6 month(s) In Person with You may see Sherren Mocha, MD  or one of the following Advanced Practice Providers on your designated Care Team:  Richardson Dopp, PA-C  Robbie Lis, PA-C  Please call our office 2 months in advance to schedule this appointment   At [facility], you and your health needs are our priority.  As part of our continuing mission to provide you with exceptional heart care, we have created designated Provider Care Teams.  These Care Teams include your primary Cardiologist (physician) and Advanced Practice Providers (APPs -  Physician Assistants and Nurse Practitioners) who all work together to provide you with the care you need, when you need it.            6 SALTY THINGS TO AVOID     1,800MG  DAILY

## 2020-04-03 HISTORY — PX: KNEE SURGERY: SHX244

## 2020-04-10 DIAGNOSIS — R7301 Impaired fasting glucose: Secondary | ICD-10-CM | POA: Diagnosis not present

## 2020-04-10 DIAGNOSIS — I10 Essential (primary) hypertension: Secondary | ICD-10-CM | POA: Diagnosis not present

## 2020-04-17 ENCOUNTER — Encounter: Payer: Self-pay | Admitting: Internal Medicine

## 2020-04-18 DIAGNOSIS — G8918 Other acute postprocedural pain: Secondary | ICD-10-CM | POA: Diagnosis not present

## 2020-04-18 DIAGNOSIS — S83281A Other tear of lateral meniscus, current injury, right knee, initial encounter: Secondary | ICD-10-CM | POA: Diagnosis not present

## 2020-04-18 DIAGNOSIS — X500XXA Overexertion from strenuous movement or load, initial encounter: Secondary | ICD-10-CM | POA: Diagnosis not present

## 2020-04-18 DIAGNOSIS — S83411A Sprain of medial collateral ligament of right knee, initial encounter: Secondary | ICD-10-CM | POA: Diagnosis not present

## 2020-04-18 DIAGNOSIS — M2241 Chondromalacia patellae, right knee: Secondary | ICD-10-CM | POA: Diagnosis not present

## 2020-04-18 DIAGNOSIS — S83241A Other tear of medial meniscus, current injury, right knee, initial encounter: Secondary | ICD-10-CM | POA: Diagnosis not present

## 2020-04-18 DIAGNOSIS — S83521A Sprain of posterior cruciate ligament of right knee, initial encounter: Secondary | ICD-10-CM | POA: Diagnosis not present

## 2020-04-20 ENCOUNTER — Telehealth: Payer: Self-pay

## 2020-04-20 MED ORDER — PANTOPRAZOLE SODIUM 40 MG PO TBEC: DELAYED_RELEASE_TABLET | ORAL | 3 refills | Status: DC

## 2020-04-26 DIAGNOSIS — S83281D Other tear of lateral meniscus, current injury, right knee, subsequent encounter: Secondary | ICD-10-CM | POA: Diagnosis not present

## 2020-04-26 DIAGNOSIS — S83241D Other tear of medial meniscus, current injury, right knee, subsequent encounter: Secondary | ICD-10-CM | POA: Diagnosis not present

## 2020-05-07 ENCOUNTER — Other Ambulatory Visit: Payer: Self-pay

## 2020-05-07 MED ORDER — AMOXICILLIN 500 MG PO CAPS: 500.0000 mg | ORAL_CAPSULE | Freq: Two times a day (BID) | ORAL | 0 refills | Status: DC

## 2020-06-01 ENCOUNTER — Other Ambulatory Visit: Payer: Self-pay

## 2020-06-01 MED ORDER — INDOMETHACIN 50 MG PO CAPS: 50.0000 mg | ORAL_CAPSULE | Freq: Three times a day (TID) | ORAL | 1 refills | Status: DC

## 2020-07-10 ENCOUNTER — Other Ambulatory Visit: Payer: Self-pay | Admitting: Orthopedic Surgery

## 2020-07-10 DIAGNOSIS — M545 Low back pain, unspecified: Secondary | ICD-10-CM

## 2020-07-13 ENCOUNTER — Telehealth: Payer: Self-pay | Admitting: Cardiovascular Disease

## 2020-07-13 NOTE — Telephone Encounter (Signed)
[facility] resolution center is calling stating she was advised 4 days ago that PA was not documented in the system as received for the patient's Repatha. Requesting a callback to confirm if the fax was received. States she is refaxing PA now to the main [facility] [employer]. Ref [phone].

## 2020-07-15 ENCOUNTER — Other Ambulatory Visit: Payer: BC Managed Care – PPO

## 2020-07-16 NOTE — Telephone Encounter (Signed)
PA was denied. Appeals sent last week

## 2020-07-17 ENCOUNTER — Ambulatory Visit
Admission: RE | Admit: 2020-07-17 | Discharge: 2020-07-17 | Disposition: A | Discharge status: Home / Self Care | Payer: Commercial Managed Care - PPO | Source: Ambulatory Visit | Attending: Orthopedic Surgery | Admitting: Orthopedic Surgery

## 2020-07-17 DIAGNOSIS — M545 Low back pain, unspecified: Secondary | ICD-10-CM

## 2020-07-19 ENCOUNTER — Telehealth: Payer: Self-pay | Admitting: *Deleted

## 2020-07-19 NOTE — Telephone Encounter (Signed)
   [facility] Pre-operative Risk Assessment    Patient Name: Leonard Edwards  DOB: [birth year]  MRN: [id number]   HEARTCARE STAFF: - Please ensure there is not already an duplicate clearance open for this procedure. - Under Visit Info/Reason for Call, type in Other and utilize the format Clearance MM/DD/YY or Clearance TBD. Do not use dashes or single digits. - If request is for dental extraction, please clarify the # of teeth to be extracted. - If the patient is currently at the dentist's office, call Pre-Op APP to address. If the patient is not currently in the dentist office, please route to the Pre-Op pool  Request for surgical clearance:  What type of surgery is being performed? RIGHT S1 TFESI   When is this surgery scheduled? TBD   What type of clearance is required (medical clearance vs. Pharmacy clearance to hold med vs. Both)? MEDICAL  Are there any medications that need to be held prior to surgery and how long? PLAVIX    Practice name and name of physician performing surgery? [facility]; DR. Doreatha Graser   What is the office phone number? [phone]   7.   What is the office fax number? [phone] ATTN: X-RAY  8.   Anesthesia type (None, local, MAC, general) ? NONE LISTED   Julaine Hua 07/19/2020, 2:36 PM  _________________________________________________________________   (provider comments below)

## 2020-07-19 NOTE — Telephone Encounter (Signed)
    Leonard Edwards DOB:  Feb 01, 1956  MRN:  [id number]   Primary Cardiologist: Sherren Mocha, MD  Chart reviewed as part of pre-operative protocol coverage. Given past medical history and time since last visit, based on ACC/AHA guidelines, Leonard Edwards would be at acceptable risk for the planned procedure without further cardiovascular testing.   [name] has been doing well since he was last seen in follow up. He continues to swim 4 days per week for exercise, around 50 min duration with no cardiac symptoms. He recently held his Plavix 5-7 days for TKA with no complications. This is a low risk procedure and he may again hold his Plavix for 5-7 days prior to back injection and resume when ok from the procedural team.   The patient was advised that if he develops new symptoms prior to surgery to contact our office to arrange for a follow-up visit, and he verbalized understanding.  I will route this recommendation to the requesting party via Epic fax function and remove from pre-op pool.  Please call with questions.  Kathyrn Drown, NP 07/19/2020, 2:55 PM

## 2020-07-26 ENCOUNTER — Other Ambulatory Visit: Payer: Self-pay | Admitting: Cardiovascular Disease

## 2020-07-28 ENCOUNTER — Telehealth: Payer: Commercial Managed Care - PPO | Admitting: Emergency Medicine

## 2020-07-28 ENCOUNTER — Encounter: Payer: Self-pay | Admitting: Emergency Medicine

## 2020-07-28 DIAGNOSIS — U071 COVID-19: Secondary | ICD-10-CM | POA: Diagnosis not present

## 2020-07-28 DIAGNOSIS — Z20822 Contact with and (suspected) exposure to covid-19: Secondary | ICD-10-CM | POA: Diagnosis not present

## 2020-07-28 MED ORDER — MOLNUPIRAVIR EUA 200MG CAPSULE: 4.0000 | ORAL_CAPSULE | Freq: Two times a day (BID) | ORAL | 0 refills | Status: AC

## 2020-07-28 NOTE — Progress Notes (Signed)
Mr. toluwani, yadav are scheduled for a virtual visit with your provider today.    Just as we do with appointments in the office, we must obtain your consent to participate.  Your consent will be active for this visit and any virtual visit you may have with one of our providers in the next 365 days.    If you have a MyChart account, I can also send a copy of this consent to you electronically.  All virtual visits are billed to your insurance company just like a traditional visit in the office.  As this is a virtual visit, video technology does not allow for your provider to perform a traditional examination.  This may limit your provider's ability to fully assess your condition.  If your provider identifies any concerns that need to be evaluated in person or the need to arrange testing such as labs, EKG, etc, we will make arrangements to do so.    Although advances in technology are sophisticated, we cannot ensure that it will always work on either your end or our end.  If the connection with a video visit is poor, we may have to switch to a telephone visit.  With either a video or telephone visit, we are not always able to ensure that we have a secure connection.   I need to obtain your verbal consent now.   Are you willing to proceed with your visit today?   Leonard Edwards has provided verbal consent on 07/28/2020 for a virtual visit (video or telephone).   Noe Gens, PA-C 07/28/2020  6:27 PM   Date:  07/28/2020   ID:  Leonard Edwards, DOB November 20, 1955, MRN [id number]  Patient Location: Home Provider Location: Home Office   Participants: Patient and Provider for Visit and Wrap up  Method of visit: Video  Location of Patient: Home Location of Provider: Home Office Consent was obtain for visit over the video. Services rendered by provider: Visit was performed via video  A video enabled telemedicine application was used and I verified that I am speaking with the correct person using two  identifiers.  PCP:  Ginger Organ., MD   Chief Complaint:  cough, sore throat, positive covid  History of Present Illness:    Leonard Edwards is a 65 y.o. male with history as stated below. Presents video telehealth for an acute care visit  Onset of symptoms was yesterday and symptoms have been persistent and include: sore throat, congestion, and cough.  Subjective fever this morning, resolved with acetaminophen.   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   He reports having COVID in January 2022, started on Paxolvid and felt better within 1-2 days.  Questioning if he should take another antiviral medication for current COVID infection.  COVID Moderna booster Oct. 2021  Denies having chills, shortness of breath, chest pain, ear pain, nausea, vomiting or diarrhea.   Modifying factors include: acetaminophen  No other aggravating or relieving factors.  No other c/o.   The patient does have symptoms concerning for COVID-19 infection (fever, chills, cough, or new shortness of breath).  Patient has been tested for COVID during this illness.  Past Medical, Surgical, Social History, Allergies, and Medications have been Reviewed.  Patient Active Problem List   Diagnosis Date Noted   Exertional angina ([place]) 10/31/2015   Crescendo angina ([place]) 10/31/2015   CAD (coronary artery disease) 04/06/2015  Abnormal nuclear cardiac imaging test 03/29/2015   TIA 10/16/2009   SINUSITIS, CHRONIC 10/10/2009   TESTICULAR HYPOFUNCTION 09/05/2009   Hyperlipemia 12/01/2007   GASTRIC POLYP 08/27/2007   ESOPHAGEAL STRICTURE 08/27/2007   HIATAL HERNIA 08/27/2007   GOUT 07/07/2007   CORONARY ARTERY DISEASE 07/07/2007   HYPERTENSION 09/16/2006   GERD 09/16/2006   BENIGN PROSTATIC HYPERTROPHY 09/16/2006   FIBROMYALGIA 09/16/2006    Social History   Tobacco Use   Smoking status: Never   Smokeless tobacco: Never   Substance Use Topics   Alcohol use: Yes    Alcohol/week: 5.0 standard drinks    Types: 5 Glasses of wine per week     Current Outpatient Medications:    molnupiravir EUA 200 mg CAPS, Take 4 capsules (800 mg total) by mouth 2 (two) times daily for 5 days., Disp: 40 capsule, Rfl: 0   allopurinol (ZYLOPRIM) 300 MG tablet, Take 300 mg by mouth daily., Disp: , Rfl:    amLODipine (NORVASC) 10 MG tablet, TAKE 1 TABLET(10 MG) BY MOUTH DAILY AT BEDTIME, Disp: 90 tablet, Rfl: 2   amoxicillin (AMOXIL) 500 MG capsule, Take 1 capsule (500 mg total) by mouth 2 (two) times daily., Disp: 20 capsule, Rfl: 0   aspirin EC 81 MG tablet, Take 81 mg by mouth daily., Disp: , Rfl:    clopidogrel (PLAVIX) 75 MG tablet, Take 1 tablet (75 mg total) by mouth daily with breakfast., Disp: 90 tablet, Rfl: 3   Coenzyme Q10 (CO Q 10) 100 MG CAPS, Take 100 mg by mouth daily., Disp: , Rfl:    colchicine 0.6 MG tablet, Take 0.6 mg by mouth as needed (gout flare up). , Disp: , Rfl:    diphenhydramine-acetaminophen (TYLENOL PM) 25-500 MG TABS tablet, Take 0.5 tablets by mouth at bedtime as needed (sleep)., Disp: , Rfl:    Evolocumab (REPATHA SURECLICK) 196 MG/ML SOAJ, INJECT 1 PEN UNDER THE SKIN EVERY 14 DAYS, Disp: 2 mL, Rfl: 11   fluticasone (FLONASE) 50 MCG/ACT nasal spray, Place 2 sprays into both nostrils daily as needed for allergies or rhinitis., Disp: , Rfl:    hydrochlorothiazide (HYDRODIURIL) 25 MG tablet, TAKE 1 TABLET(25 MG) BY MOUTH DAILY, Disp: 90 tablet, Rfl: 3   indomethacin (INDOCIN) 50 MG capsule, TAKE 1 CAPSULE BY MOUTH THREE TIMES DAILY AS NEEDED, Disp: 90 capsule, Rfl: 3   indomethacin (INDOCIN) 50 MG capsule, Take 1 capsule (50 mg total) by mouth 3 (three) times daily with meals., Disp: 90 capsule, Rfl: 1   levocetirizine (XYZAL) 5 MG tablet, Take 5 mg by mouth daily as needed for allergies., Disp: , Rfl:    lisinopril (ZESTRIL) 20 MG tablet, TAKE 1 TABLET(20 MG) BY MOUTH DAILY, Disp: 90 tablet, Rfl: 3    metoprolol succinate (TOPROL-XL) 50 MG 24 hr tablet, TAKE 1 TABLET BY MOUTH EVERY DAY, Disp: 90 tablet, Rfl: 3   montelukast (SINGULAIR) 10 MG tablet, Take 10 mg by mouth at bedtime., Disp: , Rfl:    nitroGLYCERIN (NITROSTAT) 0.4 MG SL tablet, Place 1 tablet (0.4 mg total) under the tongue every 5 (five) minutes as needed for chest pain., Disp: 25 tablet, Rfl: 1   pantoprazole (PROTONIX) 40 MG tablet, TAKE 1 TABLET(40 MG) BY MOUTH DAILY, Disp: 90 tablet, Rfl: 3   PROAIR HFA 108 (90 Base) MCG/ACT inhaler, Inhale 1 puff into the lungs every 4 (four) hours as needed for wheezing (allergies). , Disp: , Rfl:    sildenafil (VIAGRA) 100 MG tablet, Take 100 mg by mouth  as needed., Disp: , Rfl:    No Known Allergies   ROS See HPI for history of present illness.  Physical Exam Constitutional:      General: He is not in acute distress.    Appearance: Normal appearance. He is obese. He is not ill-appearing, toxic-appearing or diaphoretic.     Comments: Resting comfortably in bed. No acute distress, active and cooperative during exam.   HENT:     Head: Normocephalic.     Nose: Nose normal.  Pulmonary:     Effort: Pulmonary effort is normal. No respiratory distress.     Breath sounds: No stridor.     Comments: Speech is clear without stridor or wheeze. Able to complete full sentences without difficulty.  Musculoskeletal:     Cervical back: Normal range of motion.  Neurological:     Mental Status: He is alert.  Psychiatric:        Mood and Affect: Mood normal.        Behavior: Behavior normal.              A&P  COVID 1. COVID-19 - MyChart COVID-19 home monitoring program; Future  -Temperature monitoring; Future  -Due to CAD including HTN and bypass in 2016, pt candidate for antiviral tx.   Pt on Paxolid for COVID Jan 2022. Discussed pt with Dr. Noemi Chapel, will offer pt Molnupiravir.  Pt will see how he feels in the morning and may start tomorrow or Monday after speaking further with  his PCP. Pt verbalized umdnerstanding of limited data with Molnupiravir including no data when given Molnupiravir after second bought of COVID or after use of Paxlovid.   -Molnupiravir EUA 200 mg CAPS; Take 4 capsules (800 mg total) by mouth 2 (two) times daily for 5 days.  Dispense: 40 capsule; Refill: 0  -Continue OTC symptomatic management of choice  - Discussed return precautions and when to seek in-person evaluation, sent via AVS as well   Patient voiced understanding and agreement to plan.   Time:   Today, I have spent 15 minutes with the patient with telehealth technology discussing the above problems, reviewing the chart, previous notes, medications and orders.    Tests Ordered: No orders of the defined types were placed in this encounter.   Medication Changes: Meds ordered this encounter  Medications   molnupiravir EUA 200 mg CAPS    Sig: Take 4 capsules (800 mg total) by mouth 2 (two) times daily for 5 days.    Dispense:  40 capsule    Refill:  0     Disposition:  Follow up PCP Monday to discuss starting Molnupiravir.  Follow up in-person as needed.   Etta Grandchild, PA-C  07/28/2020 6:27 PM

## 2020-07-28 NOTE — Patient Instructions (Signed)
You may continue to take Tylenol 500mg  every 4-6 hours as needed for fever and pain.  Stay well hydrated and get plenty of rest.   Please speak with your PCP Monday as discussed in regards to starting Molnupiravir.   Call 911 or seek in-person evaluation if you develop chest pain, trouble breathing, unable to keep down fluids, passing out, or other new concerning symptoms develop.

## 2020-10-02 ENCOUNTER — Other Ambulatory Visit: Payer: Self-pay | Admitting: Cardiovascular Disease

## 2020-10-02 ENCOUNTER — Other Ambulatory Visit: Payer: Self-pay | Admitting: *Deleted

## 2020-10-02 MED ORDER — CLOPIDOGREL BISULFATE 75 MG PO TABS: 75.0000 mg | ORAL_TABLET | Freq: Every day | ORAL | 0 refills | Status: DC

## 2020-10-02 MED ORDER — HYDROCHLOROTHIAZIDE 25 MG PO TABS: ORAL_TABLET | ORAL | 0 refills | Status: DC

## 2020-10-02 MED ORDER — LISINOPRIL 20 MG PO TABS: ORAL_TABLET | ORAL | 0 refills | Status: DC

## 2020-10-31 ENCOUNTER — Other Ambulatory Visit: Payer: Self-pay

## 2020-10-31 MED ORDER — METOPROLOL SUCCINATE ER 50 MG PO TB24: 50.0000 mg | ORAL_TABLET | Freq: Every day | ORAL | 1 refills | Status: DC

## 2020-11-19 ENCOUNTER — Encounter: Payer: Self-pay | Admitting: *Deleted

## 2020-11-20 ENCOUNTER — Encounter: Payer: Self-pay | Admitting: Neurology

## 2020-11-20 ENCOUNTER — Ambulatory Visit (INDEPENDENT_AMBULATORY_CARE_PROVIDER_SITE_OTHER): Payer: Commercial Managed Care - PPO | Admitting: Neurology

## 2020-11-20 VITALS — BP 179/83 | HR 49 | Ht 70.5 in | Wt 248.0 lb

## 2020-11-20 DIAGNOSIS — R0683 Snoring: Secondary | ICD-10-CM

## 2020-11-20 DIAGNOSIS — G479 Sleep disorder, unspecified: Secondary | ICD-10-CM

## 2020-11-20 DIAGNOSIS — D751 Secondary polycythemia: Secondary | ICD-10-CM

## 2020-11-20 DIAGNOSIS — E669 Obesity, unspecified: Secondary | ICD-10-CM | POA: Diagnosis not present

## 2020-11-20 DIAGNOSIS — R351 Nocturia: Secondary | ICD-10-CM

## 2020-11-20 NOTE — Patient Instructions (Signed)

## 2020-11-20 NOTE — Progress Notes (Signed)
Subjective:    Patient ID: Leonard Edwards is a 65 y.o. male.  HPI    Star Age, MD, PhD [facility] [address].O. [place], [place] [address] Dear Dr. Brigitte Pulse,   I saw your patient, Leonard Edwards, upon your kind request in my neurologic clinic today for evaluation of his sleep disturbance, concern for sleep apnea. The patient is unaccompanied today. As you know, Leonard Edwards is a 65 year old right-handed gentleman with an underlying medical history of low back pain, coronary artery disease with status post stent placement, status post CABG, hypertension, hyperlipidemia, gout, allergic rhinitis, hypogonadism, polycythemia, and obesity, who reports snoring and difficulty maintaining sleep, as well as nonrestorative sleep and restless sleep.  He reports that he was recently diagnosed with elevated hemoglobin values, as per his report, his latest hemoglobin was 18.7.  He denies recurrent morning headaches, he has nocturia about once per average night.  I reviewed your office note from 09/05/2020.  He had also been referred in 2019 but canceled an appointment at the time.  His Epworth sleepiness score is 8 out of 24, fatigue severity score is 24 out of 63.  He recently donated blood and is planning to donate again.  He has not seen a hematologist.  He is currently staying by himself, he is separated.  He is self-employed, owns an Higher education careers adviser.  He does not drink caffeine daily, he drinks alcohol on the weekends.  He is a non-smoker.  Weight has slowly gone down over the past few years.  He exercises regularly.  He does take high-dose melatonin, per his report, 20 mg each night.  He has not tried any prescription sleep aids in the past.  Bedtime is generally between 9 and 9:30 PM and rise time around 7 AM.  I had evaluated the patient nearly 6 years ago, he did not complete a sleep study at the time.  Previously:   12/14/14: 65 year old right-handed  gentleman with an underlying medical history of hypertension, hyperlipidemia, gout, coronary artery disease, status post stent placement 2, and a prior diagnosis of fibromyalgia, low back pain and obesity, who reports snoring and excessive daytime somnolence, nonrestorative sleep, sleep disruption, and difficulty maintaining sleep. I reviewed your office note from 11/23/2014, which you kindly included. He had a sleep study about 6 years ago which at that time was negative for obstructive sleep apnea as I understand. Prior sleep test results are not available for my review. He reports a bedtime of around 10 PM. He watches TV in bed for about an hour. He falls asleep around 11 PM. He does not take any sleeping pills but was recently started on Cymbalta which is currently at 60 mg each night. His rise time is around 6 AM. He does not wake up rested. While he denies restless leg symptoms or leg twitching at night he does sometimes have cramps. He also has discomfort at night particularly with bilateral shoulder pain and thigh pain and low back pain as well as hip pains. He primarily sleeps on his stomach in both sides but does not maintain one sleep position. He has gained weight since his prior sleep study. He has no family history of OSA. He has nocturia about once on average per night. He takes no naps. He has an Epworth sleepiness score of 2 out of 24, fatigue score is 29 out of 63 today. He works full-time as an Chief Financial Officer. He reports neck pain and low back  pain. He wakes up often with a dull headache, up to 4 times a week. This usually goes away after an hour or so. It seems to come from his neck pain he believes. He has occasional head and hand tremors. He has had tremors for years and saw Dr. Erling Cruz in the past in this office for evaluation of his neck pain and tremors.    His Past Medical History Is Significant For: Past Medical History:  Diagnosis Date   Benign prostatic hypertrophy    CAD (coronary artery  disease)    a. DES to distal RCA in 09/2004; DES to Ramus Intermediate 10/2009 following abnormal stress test b. 9/27 PCI with DES x3 to LCrx   Chronic bronchitis ([place])    "in the spring"   Esophageal stricture    Exertional angina ([place]) 10/31/2015   Fibromyalgia    Gastric polyp    GERD (gastroesophageal reflux disease)    Hiatal hernia    High cholesterol    History of gout    Hyperlipidemia    Hypertension    Obesity    Pneumonia    hx of    His Past Surgical History Is Significant For: Past Surgical History:  Procedure Laterality Date   CARDIAC CATHETERIZATION N/A 03/29/2015   Procedure: Left Heart Cath and Coronary Angiography;  Surgeon: Sherren Mocha, MD;  Location: [place] [facility];  Service: Cardiovascular;  Laterality: N/A;   CARDIAC CATHETERIZATION N/A 10/31/2015   Procedure: Left Heart Cath and Cors/Grafts Angiography;  Surgeon: Sherren Mocha, MD;  Location: [place] [facility];  Service: Cardiovascular;  Laterality: N/A;   CARDIAC CATHETERIZATION N/A 10/31/2015   Procedure: Coronary Stent Intervention;  Surgeon: Sherren Mocha, MD;  Location: [place] [facility];  Service: Cardiovascular;  Laterality: N/A;   CHOLECYSTECTOMY N/A 07/07/2017   Procedure: LAPAROSCOPIC CHOLECYSTECTOMY WITH INTRAOPERATIVE CHOLANGIOGRAM;  Surgeon: Excell Seltzer, MD;  Location: [facility];  Service: General;  Laterality: N/A;   COLONOSCOPY W/ POLYPECTOMY     CORONARY ANGIOPLASTY WITH STENT PLACEMENT  09/2004-10/2009   DES to distal RCA in 09/2004; DES to Ramus Intermediate 10/2009 following abnormal stress test   CORONARY ARTERY BYPASS GRAFT N/A 04/06/2015   Procedure: CORONARY ARTERY BYPASS GRAFTING (CABG) x three,  using bilateral mammaries, and right leg greater saphenous vein harvested endoscopically;  Surgeon: Gaye Pollack, MD;  Location: [place];  Service: Open Heart Surgery;  Laterality: N/A;   ESOPHAGOGASTRODUODENOSCOPY (EGD) WITH ESOPHAGEAL DILATION     KNEE SURGERY  04/2020   SHOULDER  ARTHROSCOPY Right 2015   scar tissue   TEE WITHOUT CARDIOVERSION N/A 04/06/2015   Procedure: TRANSESOPHAGEAL ECHOCARDIOGRAM (TEE);  Surgeon: Gaye Pollack, MD;  Location: [facility];  Service: Open Heart Surgery;  Laterality: N/A;   ULTRASOUND GUIDANCE FOR VASCULAR ACCESS  03/29/2015   Procedure: Ultrasound Guidance For Vascular Access;  Surgeon: Sherren Mocha, MD;  Location: [place] [facility];  Service: Cardiovascular;;   UMBILICAL HERNIA REPAIR N/A 07/07/2017   Procedure: HERNIA REPAIR UMBILICAL ADULT;  Surgeon: Excell Seltzer, MD;  Location: [facility];  Service: General;  Laterality: N/A;    His Family History Is Significant For: Family History  Problem Relation Age of Onset   COPD Mother    Bladder Cancer Mother    Heart attack Father    Hypertension Father    Lupus Sister    Diabetes type II Sister    Colon cancer Neg Hx    Pancreatic cancer Neg Hx    Stomach cancer  Neg Hx     His Social History Is Significant For: Social History   Socioeconomic History   Marital status: Married    Spouse name: Not on file   Number of children: 4   Years of education: College   Highest education level: Not on file  Occupational History    Employer: [employer]  Tobacco Use   Smoking status: Never   Smokeless tobacco: Never  Vaping Use   Vaping Use: Never used  Substance and Sexual Activity   Alcohol use: Yes    Alcohol/week: 5.0 standard drinks    Types: 5 Glasses of wine per week   Drug use: No   Sexual activity: Not Currently  Other Topics Concern   Not on file  Social History Narrative   Not on file   Social Determinants of Health   Financial Resource Strain: Not on file  Food Insecurity: Not on file  Transportation Needs: Not on file  Physical Activity: Not on file  Stress: Not on file  Social Connections: Not on file    His Allergies Are:  No Known Allergies:   His Current Medications Are:  Outpatient Encounter Medications as of 11/20/2020  Medication Sig    allopurinol (ZYLOPRIM) 300 MG tablet Take 300 mg by mouth daily.   amLODipine (NORVASC) 10 MG tablet TAKE 1 TABLET(10 MG) BY MOUTH DAILY AT BEDTIME   amoxicillin (AMOXIL) 500 MG capsule Take 1 capsule (500 mg total) by mouth 2 (two) times daily. (Patient not taking: Reported on 11/20/2020)   aspirin EC 81 MG tablet Take 81 mg by mouth daily.   clopidogrel (PLAVIX) 75 MG tablet TAKE 1 TABLET(75 MG) BY MOUTH DAILY WITH BREAKFAST   Coenzyme Q10 (CO Q 10) 100 MG CAPS Take 100 mg by mouth daily.   colchicine 0.6 MG tablet Take 0.6 mg by mouth as needed (gout flare up).    diphenhydramine-acetaminophen (TYLENOL PM) 25-500 MG TABS tablet Take 0.5 tablets by mouth at bedtime as needed (sleep).   Evolocumab (REPATHA SURECLICK) 182 MG/ML SOAJ INJECT 1 PEN UNDER THE SKIN EVERY 14 DAYS   fluticasone (FLONASE) 50 MCG/ACT nasal spray Place 2 sprays into both nostrils daily as needed for allergies or rhinitis.   hydrochlorothiazide (HYDRODIURIL) 25 MG tablet TAKE 1 TABLET(25 MG) BY MOUTH DAILY   indomethacin (INDOCIN) 50 MG capsule TAKE 1 CAPSULE BY MOUTH THREE TIMES DAILY AS NEEDED   levocetirizine (XYZAL) 5 MG tablet Take 5 mg by mouth daily as needed for allergies.   lisinopril (ZESTRIL) 20 MG tablet TAKE 1 TABLET(20 MG) BY MOUTH DAILY   metoprolol succinate (TOPROL-XL) 50 MG 24 hr tablet Take 1 tablet (50 mg total) by mouth daily. Take with or immediately following a meal.   montelukast (SINGULAIR) 10 MG tablet Take 10 mg by mouth at bedtime.   nitroGLYCERIN (NITROSTAT) 0.4 MG SL tablet Place 1 tablet (0.4 mg total) under the tongue every 5 (five) minutes as needed for chest pain.   pantoprazole (PROTONIX) 40 MG tablet TAKE 1 TABLET(40 MG) BY MOUTH DAILY   PROAIR HFA 108 (90 Base) MCG/ACT inhaler Inhale 1 puff into the lungs every 4 (four) hours as needed for wheezing (allergies).    sildenafil (VIAGRA) 100 MG tablet Take 100 mg by mouth as needed.   [DISCONTINUED] indomethacin (INDOCIN) 50 MG capsule  Take 1 capsule (50 mg total) by mouth 3 (three) times daily with meals.   No facility-administered encounter medications on file as of 11/20/2020.  :  Review of Systems:  Out of a complete 14 point review of systems, all are reviewed and negative with the exception of these symptoms as listed below:  Review of Systems  Neurological:        Fatigue, check for sleep apnea. ESS 8, FSS 24.    Objective:  Neurological Exam  Physical Exam Physical Examination:   Vitals:   11/20/20 1120  BP: (!) 179/83  Pulse: (!) 49    General Examination: The patient is a very pleasant 65 y.o. male in no acute distress. He appears well-developed and well-nourished and well groomed.   HEENT: Normocephalic, atraumatic, pupils are equal, round and reactive to light, extraocular tracking is good without limitation to gaze excursion or nystagmus noted. Hearing is grossly intact. Face is symmetric with normal facial animation. Speech is clear with no dysarthria noted. There is no hypophonia. There is no lip, neck/head, jaw or voice tremor. Neck is supple with full range of passive and active motion. There are no carotid bruits on auscultation. Oropharynx exam reveals: mild mouth dryness, adequate dental hygiene and moderate airway crowding, due to small airway entry, redundant soft palate, larger appearing uvula, tonsillar size of about 1+ bilaterally.  Mallampati class III.  Neck circumference of 18-1/2 inches.  Tongue protrudes centrally and palate elevates symmetrically.   Chest: Clear to auscultation without wheezing, rhonchi or crackles noted.  Heart: S1+S2+0, regular and normal without murmurs, rubs or gallops noted.   Abdomen: Soft, non-tender and non-distended.  Extremities: There is no obvious edema in the distal lower extremities bilaterally.   Skin: Warm and dry without trophic changes noted.   Musculoskeletal: exam reveals no obvious joint deformities.   Neurologically:  Mental status: The  patient is awake, alert and oriented in all 4 spheres. His immediate and remote memory, attention, language skills and fund of knowledge are appropriate. There is no evidence of aphasia, agnosia, apraxia or anomia. Speech is clear with normal prosody and enunciation. Thought process is linear. Mood is normal and affect is normal.  Cranial nerves II - XII are as described above under HEENT exam.  Motor exam: Normal bulk, strength and tone is noted. There is no tremor, fine motor skills and coordination: grossly intact.  Cerebellar testing: No dysmetria or intention tremor. There is no truncal or gait ataxia.  Sensory exam: intact to light touch in the upper and lower extremities.  Gait, station and balance: He stands easily. No veering to one side is noted. No leaning to one side is noted. Posture is age-appropriate and stance is narrow based. Gait shows normal stride length and normal pace. No problems turning are noted.   Assessment and Plan:  In summary, Leonard Edwards is a very pleasant 65 y.o.-year old male with an underlying medical history of low back pain, coronary artery disease with status post stent placement, status post CABG, hypertension, hyperlipidemia, gout, allergic rhinitis, hypogonadism, polycythemia, and obesity, whose history and physical exam are concerning for obstructive sleep apnea (OSA). I had a long chat with the patient about my findings and the diagnosis of OSA, its prognosis and treatment options. We talked about medical treatments, surgical interventions and non-pharmacological approaches. I explained in particular the risks and ramifications of untreated moderate to severe OSA, especially with respect to developing cardiovascular disease down the Road, including congestive heart failure, difficult to treat hypertension, cardiac arrhythmias, or stroke. Even type 2 diabetes has, in part, been linked to untreated OSA. Symptoms of untreated OSA include daytime sleepiness, memory  problems, mood irritability and mood disorder such as depression and anxiety, lack of energy, as well as recurrent headaches, especially morning headaches. We talked about trying to maintain a healthy lifestyle in general, as well as the importance of weight control. We also talked about the importance of good sleep hygiene. I recommended the following at this time: sleep study.  I outlined the difference between a laboratory attended sleep study versus home sleep test.  I explained the sleep test procedure to the patient and also outlined possible surgical and non-surgical treatment options of OSA, including the use of a custom-made dental device (which would require a referral to a specialist dentist or oral surgeon), upper airway surgical options, such as traditional UPPP or a novel less invasive surgical option in the form of Inspire hypoglossal nerve stimulation (which would involve a referral to an ENT surgeon). I also explained the CPAP treatment option to the patient, who indicated that he would be willing to try CPAP if the need arises.  We will pick up our discussion after testing.  We will keep him posted as to his test results by phone call and plan a follow-up accordingly.  I answered all his questions today and he was in agreement.  Thank you very much for allowing me to participate in the care of this nice patient. If I can be of any further assistance to you please do not hesitate to call me at [phone].  Sincerely,   Star Age, MD, PhD

## 2020-11-27 ENCOUNTER — Telehealth: Payer: Self-pay | Admitting: Cardiovascular Disease

## 2020-11-27 ENCOUNTER — Encounter (HOSPITAL_COMMUNITY): Payer: Self-pay | Admitting: Cardiology

## 2020-11-27 ENCOUNTER — Observation Stay (HOSPITAL_COMMUNITY)
Admission: AD | Admit: 2020-11-27 | Discharge: 2020-11-28 | Disposition: A | Discharge status: Home / Self Care | Payer: Commercial Managed Care - PPO | Source: Ambulatory Visit | Attending: Cardiology | Admitting: Cardiology

## 2020-11-27 ENCOUNTER — Other Ambulatory Visit: Payer: Self-pay

## 2020-11-27 ENCOUNTER — Ambulatory Visit (INDEPENDENT_AMBULATORY_CARE_PROVIDER_SITE_OTHER): Payer: Commercial Managed Care - PPO | Admitting: Cardiology

## 2020-11-27 ENCOUNTER — Encounter: Payer: Self-pay | Admitting: Cardiology

## 2020-11-27 VITALS — BP 136/88 | HR 57 | Ht 70.5 in | Wt 242.2 lb

## 2020-11-27 DIAGNOSIS — I2510 Atherosclerotic heart disease of native coronary artery without angina pectoris: Principal | ICD-10-CM | POA: Insufficient documentation

## 2020-11-27 DIAGNOSIS — Z7982 Long term (current) use of aspirin: Secondary | ICD-10-CM | POA: Diagnosis not present

## 2020-11-27 DIAGNOSIS — I2583 Coronary atherosclerosis due to lipid rich plaque: Secondary | ICD-10-CM

## 2020-11-27 DIAGNOSIS — Z20822 Contact with and (suspected) exposure to covid-19: Secondary | ICD-10-CM | POA: Diagnosis not present

## 2020-11-27 DIAGNOSIS — R079 Chest pain, unspecified: Secondary | ICD-10-CM | POA: Diagnosis present

## 2020-11-27 DIAGNOSIS — Z79899 Other long term (current) drug therapy: Secondary | ICD-10-CM | POA: Diagnosis not present

## 2020-11-27 DIAGNOSIS — I208 Other forms of angina pectoris: Secondary | ICD-10-CM

## 2020-11-27 DIAGNOSIS — E785 Hyperlipidemia, unspecified: Secondary | ICD-10-CM | POA: Diagnosis present

## 2020-11-27 DIAGNOSIS — Z951 Presence of aortocoronary bypass graft: Secondary | ICD-10-CM | POA: Insufficient documentation

## 2020-11-27 DIAGNOSIS — E7800 Pure hypercholesterolemia, unspecified: Secondary | ICD-10-CM

## 2020-11-27 DIAGNOSIS — Z7902 Long term (current) use of antithrombotics/antiplatelets: Secondary | ICD-10-CM | POA: Insufficient documentation

## 2020-11-27 DIAGNOSIS — I10 Essential (primary) hypertension: Secondary | ICD-10-CM

## 2020-11-27 LAB — CBC WITH DIFFERENTIAL/PLATELET
Abs Immature Granulocytes: 0.03 10*3/uL (ref 0.00–0.07)
Basophils Absolute: 0.1 10*3/uL (ref 0.0–0.1)
Basophils Relative: 1 %
Eosinophils Absolute: 0.2 10*3/uL (ref 0.0–0.5)
Eosinophils Relative: 2 %
HCT: 48.1 % (ref 39.0–52.0)
Hemoglobin: 16.9 g/dL (ref 13.0–17.0)
Immature Granulocytes: 0 %
Lymphocytes Relative: 21 %
Lymphs Abs: 2.2 10*3/uL (ref 0.7–4.0)
MCH: 31.4 pg (ref 26.0–34.0)
MCHC: 35.1 g/dL (ref 30.0–36.0)
MCV: 89.2 fL (ref 80.0–100.0)
Monocytes Absolute: 1.4 10*3/uL — ABNORMAL HIGH (ref 0.1–1.0)
Monocytes Relative: 14 %
Neutro Abs: 6.5 10*3/uL (ref 1.7–7.7)
Neutrophils Relative %: 62 %
Platelets: 288 10*3/uL (ref 150–400)
RBC: 5.39 MIL/uL (ref 4.22–5.81)
RDW: 12.3 % (ref 11.5–15.5)
WBC: 10.4 10*3/uL (ref 4.0–10.5)
nRBC: 0 % (ref 0.0–0.2)

## 2020-11-27 LAB — COMPREHENSIVE METABOLIC PANEL
ALT: 22 U/L (ref 0–44)
AST: 24 U/L (ref 15–41)
Albumin: 3.6 g/dL (ref 3.5–5.0)
Alkaline Phosphatase: 63 U/L (ref 38–126)
Anion gap: 10 (ref 5–15)
BUN: 11 mg/dL (ref 8–23)
CO2: 28 mmol/L (ref 22–32)
Calcium: 9.2 mg/dL (ref 8.9–10.3)
Chloride: 100 mmol/L (ref 98–111)
Creatinine, Ser: 0.92 mg/dL (ref 0.61–1.24)
GFR, Estimated: >60 mL/min (ref >60)
Glucose, Bld: 96 mg/dL (ref 70–99)
Potassium: 3.7 mmol/L (ref 3.5–5.1)
Sodium: 138 mmol/L (ref 135–145)
Total Bilirubin: 2 mg/dL — ABNORMAL HIGH (ref 0.3–1.2)
Total Protein: 6.9 g/dL (ref 6.5–8.1)

## 2020-11-27 LAB — TROPONIN I (HIGH SENSITIVITY)
Troponin I (High Sensitivity): 9 ng/L (ref <18)
Troponin I (High Sensitivity): 10 ng/L (ref <18)

## 2020-11-27 MED ORDER — AMLODIPINE BESYLATE 10 MG PO TABS
10.0000 mg | ORAL_TABLET | Freq: Every day | ORAL | Status: DC
  Administered 2020-11-28: 10 mg via ORAL
  Filled 2020-11-27: qty 1

## 2020-11-27 MED ORDER — ASPIRIN EC 81 MG PO TBEC: 81.0000 mg | DELAYED_RELEASE_TABLET | Freq: Every day | ORAL | Status: DC

## 2020-11-27 MED ORDER — SODIUM CHLORIDE 0.9 % WEIGHT BASED INFUSION
3.0000 mL/kg/h | INTRAVENOUS | Status: AC
  Administered 2020-11-28: 3 mL/kg/h via INTRAVENOUS

## 2020-11-27 MED ORDER — HYDROCHLOROTHIAZIDE 25 MG PO TABS
25.0000 mg | ORAL_TABLET | Freq: Every day | ORAL | Status: DC
  Administered 2020-11-28: 25 mg via ORAL
  Filled 2020-11-27: qty 1

## 2020-11-27 MED ORDER — SODIUM CHLORIDE 0.9 % WEIGHT BASED INFUSION
1.0000 mL/kg/h | INTRAVENOUS | Status: DC
  Administered 2020-11-28: 1 mL/kg/h via INTRAVENOUS

## 2020-11-27 MED ORDER — CLOPIDOGREL BISULFATE 75 MG PO TABS
75.0000 mg | ORAL_TABLET | Freq: Every day | ORAL | Status: DC
  Administered 2020-11-28: 75 mg via ORAL
  Filled 2020-11-27: qty 1

## 2020-11-27 MED ORDER — PANTOPRAZOLE SODIUM 40 MG PO TBEC
40.0000 mg | DELAYED_RELEASE_TABLET | Freq: Every day | ORAL | Status: DC
  Administered 2020-11-27 – 2020-11-28 (×2): 40 mg via ORAL
  Filled 2020-11-27 (×2): qty 1

## 2020-11-27 MED ORDER — LISINOPRIL 20 MG PO TABS
20.0000 mg | ORAL_TABLET | Freq: Every day | ORAL | Status: DC
  Administered 2020-11-28: 20 mg via ORAL
  Filled 2020-11-27: qty 1

## 2020-11-27 MED ORDER — HEPARIN (PORCINE) 25000 UT/250ML-% IV SOLN
1600.0000 [IU]/h | INTRAVENOUS | Status: DC
  Administered 2020-11-27: 1250 [IU]/h via INTRAVENOUS
  Administered 2020-11-28: 1600 [IU]/h via INTRAVENOUS
  Filled 2020-11-27 (×2): qty 250

## 2020-11-27 MED ORDER — ONDANSETRON HCL 4 MG/2ML IJ SOLN: 4.0000 mg | Freq: Four times a day (QID) | INTRAMUSCULAR | Status: DC | PRN

## 2020-11-27 MED ORDER — ASPIRIN EC 81 MG PO TBEC
81.0000 mg | DELAYED_RELEASE_TABLET | Freq: Every day | ORAL | Status: DC
  Filled 2020-11-27: qty 1

## 2020-11-27 MED ORDER — METOPROLOL SUCCINATE ER 50 MG PO TB24
50.0000 mg | ORAL_TABLET | Freq: Every day | ORAL | Status: DC
  Administered 2020-11-28: 50 mg via ORAL
  Filled 2020-11-27: qty 1

## 2020-11-27 MED ORDER — HEPARIN BOLUS VIA INFUSION
4000.0000 [IU] | Freq: Once | INTRAVENOUS | Status: AC
  Administered 2020-11-27: 4000 [IU] via INTRAVENOUS
  Filled 2020-11-27: qty 4000

## 2020-11-27 MED ORDER — NITROGLYCERIN 0.4 MG SL SUBL: 0.4000 mg | SUBLINGUAL_TABLET | SUBLINGUAL | 3 refills | Status: DC | PRN

## 2020-11-27 MED ORDER — ASPIRIN 81 MG PO CHEW
81.0000 mg | CHEWABLE_TABLET | ORAL | Status: AC
  Administered 2020-11-28: 81 mg via ORAL
  Filled 2020-11-27: qty 1

## 2020-11-27 MED ORDER — ASPIRIN 300 MG RE SUPP
300.0000 mg | RECTAL | Status: AC
  Filled 2020-11-27: qty 1

## 2020-11-27 MED ORDER — SODIUM CHLORIDE 0.9% FLUSH: 3.0000 mL | Freq: Two times a day (BID) | INTRAVENOUS | Status: DC

## 2020-11-27 MED ORDER — NITROGLYCERIN 0.4 MG SL SUBL: 0.4000 mg | SUBLINGUAL_TABLET | SUBLINGUAL | Status: DC | PRN

## 2020-11-27 MED ORDER — SODIUM CHLORIDE 0.9% FLUSH: 3.0000 mL | INTRAVENOUS | Status: DC | PRN

## 2020-11-27 MED ORDER — ACETAMINOPHEN 325 MG PO TABS: 650.0000 mg | ORAL_TABLET | ORAL | Status: DC | PRN

## 2020-11-27 MED ORDER — SODIUM CHLORIDE 0.9 % IV SOLN: 250.0000 mL | INTRAVENOUS | Status: DC | PRN

## 2020-11-27 MED ORDER — ASPIRIN 81 MG PO CHEW
324.0000 mg | CHEWABLE_TABLET | ORAL | Status: AC
  Administered 2020-11-27: 324 mg via ORAL
  Filled 2020-11-27: qty 4

## 2020-11-27 MED ORDER — ALLOPURINOL 300 MG PO TABS
300.0000 mg | ORAL_TABLET | Freq: Every day | ORAL | Status: DC
  Administered 2020-11-28: 300 mg via ORAL
  Filled 2020-11-27: qty 1

## 2020-11-27 NOTE — Telephone Encounter (Signed)
Called patient back about his message. Patient stated he is having chest pain that comes and goes since last weekend. Patient has history of CABG and several heart cath since. Will schedule patient with the DOD to get evaluated. Refilled patient's nitro and informed patient on how to take and when to call 911. Informed patient if his symptoms get worse to go to the ED. Patient verbalized understanding.

## 2020-11-27 NOTE — Telephone Encounter (Signed)
Pt c/o of Chest Pain: STAT if CP now or developed within 24 hours  1. Are you having CP right now? Just tightness in his chest sometimes in his back  2. Are you experiencing any other symptoms (ex. SOB, nausea, vomiting, sweating)? no  3. How long have you been experiencing CP? Since Saturday   4. Is your CP continuous or coming and going? Coming and going   5. Have you taken Nitroglycerin? no ?

## 2020-11-27 NOTE — Patient Instructions (Addendum)
Medication Instructions:  Your physician recommends that you continue on your current medications as directed. Please refer to the Current Medication list given to you today.  *If you need a refill on your cardiac medications before your next appointment, please call your pharmacy*   Follow-Up: At [facility], you and your health needs are our priority.  As part of our continuing mission to provide you with exceptional heart care, we have created designated Provider Care Teams.  These Care Teams include your primary Cardiologist (physician) and Advanced Practice Providers (APPs -  Physician Assistants and Nurse Practitioners) who all work together to provide you with the care you need, when you need it.  Dr. Radford Pax is admitting you to the hospital.

## 2020-11-27 NOTE — Progress Notes (Signed)
ANTICOAGULATION CONSULT NOTE - Initial Consult  Pharmacy Consult for Heparin Indication: chest pain/ACS  No Known Allergies  Patient Measurements:   Heparin Dosing Weight: 97.8 kg  Vital Signs: Temp: 98.2 F (36.8 C) (10/25 1905) Temp Source: Oral (10/25 1905) BP: 152/91 (10/25 1905) Pulse Rate: 58 (10/25 1905)  Labs: No results for input(s): HGB, HCT, PLT, APTT, LABPROT, INR, HEPARINUNFRC, HEPRLOWMOCWT, CREATININE, CKTOTAL, CKMB, TROPONINIHS in the last 72 hours.  CrCl cannot be calculated (Patient's most recent lab result is older than the maximum 21 days allowed.).   Medical History: Past Medical History:  Diagnosis Date   Benign prostatic hypertrophy    CAD (coronary artery disease)    a. DES to distal RCA in 09/2004; DES to Ramus Intermediate 10/2009 following abnormal stress test b. 9/27 PCI with DES x3 to LCrx   Chronic bronchitis ([place])    "in the spring"   Esophageal stricture    Exertional angina ([place]) 10/31/2015   Fibromyalgia    Gastric polyp    GERD (gastroesophageal reflux disease)    Hiatal hernia    High cholesterol    History of gout    Hyperlipidemia    Hypertension    Obesity    Pneumonia    hx of    Assessment: 65 y.o. male presents today due to episodes of chest pain. Patient is not on any anticoagualtion as an outpatient. Pharmacy is consulted to start heparin drip for ACS.  Goal of Therapy:  Heparin level 0.3-0.7 units/ml Monitor platelets by anticoagulation protocol: Yes   Plan:  Give 4000 units bolus x 1 Start heparin infusion at 1250 units/hr Check anti-Xa level in 6 hours and daily while on heparin Continue to monitor H&H and platelets  Alanda Slim, PharmD, [facility] Clinical Pharmacist Please see AMION for all Pharmacists' Contact Phone Numbers 11/27/2020, 7:46 PM

## 2020-11-27 NOTE — H&P (View-Only) (Signed)
Cardiology Office Note:    Date:  11/27/2020   ID:  Leonard Edwards, DOB 1955/12/24, MRN [id number]  PCP:  Ginger Organ., MD  Cardiologist:  Sherren Mocha, MD    Referring MD: Ginger Organ., MD   Chief Complaint  Patient presents with   Coronary Artery Disease   Hypertension   Hyperlipidemia   Chest Pain     History of Present Illness:    Leonard Edwards is a 65 y.o. male with a hx of ASCAD with multiple PCI procedures remotely and ultimately underwent CABG with LIMA to LAD, SVG to distal RCA and SVG to ramus intermedius in 2017.Marland Kitchen   He then underwent PCI with DES x3 to the left circumflex in September 2017.  He also has a history of hypertension, hyperlipidemia, hiatal hernia with GERD, fibromyalgia, esophageal stricture.  He presents today due to episodes of chest pain he has recently been experiencing.  He was at the beach this past weekend and he started having discomfort in his chest pain back behind his shoulder blade.  He walked 5 miles on the beach on Sat and he rode the bike for 10 miles last Friday and was swimming every day for a hour last week with no problems.    On Saturday he felt fine walking but then started feeling a vague discomfort in his chest that he has a hard time describing.  He rested since then but still continues to have a very mild tightness in his chest.  He says that it comes and goes and if he really pays attention he will notice mild tightness in his chest.  I cannot discern if it has really been constant for intermittent since the weekend.  He denies any DOE or SOB with the CP.  There was no associated nausea or diaphoresis.  It occurs with rest and with exertion.  He is under a lot of stress and is going through a separation and has stress a work.   Past Medical History:  Diagnosis Date   Benign prostatic hypertrophy    CAD (coronary artery disease)    a. DES to distal RCA in 09/2004; DES to Ramus Intermediate 10/2009 following abnormal  stress test b. 9/27 PCI with DES x3 to LCrx   Chronic bronchitis ([place])    "in the spring"   Esophageal stricture    Exertional angina ([place]) 10/31/2015   Fibromyalgia    Gastric polyp    GERD (gastroesophageal reflux disease)    Hiatal hernia    High cholesterol    History of gout    Hyperlipidemia    Hypertension    Obesity    Pneumonia    hx of    Past Surgical History:  Procedure Laterality Date   CARDIAC CATHETERIZATION N/A 03/29/2015   Procedure: Left Heart Cath and Coronary Angiography;  Surgeon: Sherren Mocha, MD;  Location: [facility];  Service: Cardiovascular;  Laterality: N/A;   CARDIAC CATHETERIZATION N/A 10/31/2015   Procedure: Left Heart Cath and Cors/Grafts Angiography;  Surgeon: Sherren Mocha, MD;  Location: [facility];  Service: Cardiovascular;  Laterality: N/A;   CARDIAC CATHETERIZATION N/A 10/31/2015   Procedure: Coronary Stent Intervention;  Surgeon: Sherren Mocha, MD;  Location: [facility];  Service: Cardiovascular;  Laterality: N/A;   CHOLECYSTECTOMY N/A 07/07/2017   Procedure: LAPAROSCOPIC CHOLECYSTECTOMY WITH INTRAOPERATIVE CHOLANGIOGRAM;  Surgeon: Excell Seltzer, MD;  [facility]: [facility];  Service: General;  Laterality: N/A;   COLONOSCOPY W/  POLYPECTOMY     CORONARY ANGIOPLASTY WITH STENT PLACEMENT  09/2004-10/2009   DES to distal RCA in 09/2004; DES to Ramus Intermediate 10/2009 following abnormal stress test   CORONARY ARTERY BYPASS GRAFT N/A 04/06/2015   Procedure: CORONARY ARTERY BYPASS GRAFTING (CABG) x three,  using bilateral mammaries, and right leg greater saphenous vein harvested endoscopically;  Surgeon: Gaye Pollack, MD;  Location: [facility];  Service: Open Heart Surgery;  Laterality: N/A;   ESOPHAGOGASTRODUODENOSCOPY (EGD) WITH ESOPHAGEAL DILATION     KNEE SURGERY  04/2020   SHOULDER ARTHROSCOPY Right 2015   scar tissue   TEE WITHOUT CARDIOVERSION N/A 04/06/2015   Procedure: TRANSESOPHAGEAL ECHOCARDIOGRAM (TEE);  Surgeon:  Gaye Pollack, MD;  Location: [facility] [place];  Service: Open Heart Surgery;  Laterality: N/A;   ULTRASOUND GUIDANCE FOR VASCULAR ACCESS  03/29/2015   Procedure: Ultrasound Guidance For Vascular Access;  Surgeon: Sherren Mocha, MD;  Location: [facility];  Service: Cardiovascular;;   UMBILICAL HERNIA REPAIR N/A 07/07/2017   Procedure: HERNIA REPAIR UMBILICAL ADULT;  Surgeon: Excell Seltzer, MD;  [facility];  Service: General;  Laterality: N/A;    Current Medications: Current Meds  Medication Sig   allopurinol (ZYLOPRIM) 300 MG tablet Take 300 mg by mouth daily.   amLODipine (NORVASC) 10 MG tablet TAKE 1 TABLET(10 MG) BY MOUTH DAILY AT BEDTIME   aspirin EC 81 MG tablet Take 81 mg by mouth daily.   clopidogrel (PLAVIX) 75 MG tablet TAKE 1 TABLET(75 MG) BY MOUTH DAILY WITH BREAKFAST   Coenzyme Q10 (CO Q 10) 100 MG CAPS Take 100 mg by mouth daily.   colchicine 0.6 MG tablet Take 0.6 mg by mouth as needed (gout flare up).    diphenhydramine-acetaminophen (TYLENOL PM) 25-500 MG TABS tablet Take 0.5 tablets by mouth at bedtime as needed (sleep).   Evolocumab (REPATHA SURECLICK) 408 MG/ML SOAJ INJECT 1 PEN UNDER THE SKIN EVERY 14 DAYS   fluticasone (FLONASE) 50 MCG/ACT nasal spray Place 2 sprays into both nostrils daily as needed for allergies or rhinitis.   hydrochlorothiazide (HYDRODIURIL) 25 MG tablet TAKE 1 TABLET(25 MG) BY MOUTH DAILY   indomethacin (INDOCIN) 50 MG capsule TAKE 1 CAPSULE BY MOUTH THREE TIMES DAILY AS NEEDED   levocetirizine (XYZAL) 5 MG tablet Take 5 mg by mouth daily as needed for allergies.   lisinopril (ZESTRIL) 20 MG tablet TAKE 1 TABLET(20 MG) BY MOUTH DAILY   metoprolol succinate (TOPROL-XL) 50 MG 24 hr tablet Take 1 tablet (50 mg total) by mouth daily. Take with or immediately following a meal.   montelukast (SINGULAIR) 10 MG tablet Take 10 mg by mouth at bedtime.   nitroGLYCERIN (NITROSTAT) 0.4 MG SL tablet Place 1 tablet (0.4 mg total) under the tongue every 5  (five) minutes as needed for chest pain.   pantoprazole (PROTONIX) 40 MG tablet TAKE 1 TABLET(40 MG) BY MOUTH DAILY   PROAIR HFA 108 (90 Base) MCG/ACT inhaler Inhale 1 puff into the lungs every 4 (four) hours as needed for wheezing (allergies).    sildenafil (VIAGRA) 100 MG tablet Take 100 mg by mouth as needed.     Allergies:   Patient has no known allergies.   Social History   Socioeconomic History   Marital status: Married    Spouse name: Not on file   Number of children: 4   Years of education: College   Highest education level: Not on file  Occupational History    Employer: [employer]  Tobacco Use  Smoking status: Never   Smokeless tobacco: Never  Vaping Use   Vaping Use: Never used  Substance and Sexual Activity   Alcohol use: Yes    Alcohol/week: 5.0 standard drinks    Types: 5 Glasses of wine per week   Drug use: No   Sexual activity: Not Currently  Other Topics Concern   Not on file  Social History Narrative   Not on file   Social Determinants of Health   Financial Resource Strain: Not on file  Food Insecurity: Not on file  Transportation Needs: Not on file  Physical Activity: Not on file  Stress: Not on file  Social Connections: Not on file     Family History: The patient's family history includes Bladder Cancer in his mother; COPD in his mother; Diabetes type II in his sister; Heart attack in his father; Hypertension in his father; Lupus in his sister. There is no history of Colon cancer, Pancreatic cancer, or Stomach cancer.  ROS:   Please see the history of present illness.    ROS  All other systems reviewed and negative.   EKGs/Labs/Other Studies Reviewed:    The following studies were reviewed today: EKG  EKG:  EKG is  ordered today.  The ekg ordered today demonstrates Sinus bradycardia at 57bpm with nonspecific T wave abnormality  Recent Labs: 02/25/2020: BUN 8; Creatinine, Ser 0.84; Potassium 3.8; Sodium 140   Recent Lipid Panel     Component Value Date/Time   CHOL 108 11/16/2019 0901   TRIG 136 11/16/2019 0901   TRIG 147 02/10/2006 0856   HDL 40 11/16/2019 0901   CHOLHDL 2.7 11/16/2019 0901   CHOLHDL 3.9 12/19/2015 0906   VLDL 27 12/19/2015 0906   LDLCALC 44 11/16/2019 0901   LDLDIRECT 68.2 10/25/2010 1015    Physical Exam:    VS:  BP 136/88   Pulse (!) 57   Ht 5' 10.5" (1.791 m)   Wt 242 lb 3.2 oz (109.9 kg)   SpO2 93%   BMI 34.26 kg/m     Wt Readings from Last 3 Encounters:  11/27/20 242 lb 3.2 oz (109.9 kg)  11/20/20 248 lb (112.5 kg)  03/29/20 252 lb 12.8 oz (114.7 kg)     GEN:  Well nourished, well developed in no acute distress HEENT: Normal NECK: No JVD; No carotid bruits LYMPHATICS: No lymphadenopathy CARDIAC: RRR, no murmurs, rubs, gallops RESPIRATORY:  Clear to auscultation without rales, wheezing or rhonchi  ABDOMEN: Soft, non-tender, non-distended MUSCULOSKELETAL:  No edema; No deformity  SKIN: Warm and dry NEUROLOGIC:  Alert and oriented x 3 PSYCHIATRIC:  Normal affect   ASSESSMENT:    1. Exertional angina (HCC)   2. Coronary artery disease due to lipid rich plaque   3. Essential hypertension   4. Pure hypercholesterolemia    PLAN:    In order of problems listed above:  Chest pain -his symptoms are concerning and have been going on for several days and seem to be off and on but currently has some discomfort in the office now -EKG is nonischemic -he certainly has significant CAD and may have progression of CAD.  He also has GERD and started taking Protonix yesterday.  He thinks his discomfort is more like his angina -given that he is currently having some chest discomfort at rest, I think we need to send him to the ER for admission and make NPO after MN for LHC in am to redefine coronary anatomy -will check hsTrop x 2 -start IV  Heparin gtt  -continue current cardiac meds -Shared Decision Making/Informed Consent{ The risks [stroke (1 in 1000), death (1 in 1000), kidney  failure [usually temporary] (1 in 500), bleeding (1 in 200), allergic reaction [possibly serious] (1 in 200)], benefits (diagnostic support and management of coronary artery disease) and alternatives of a cardiac catheterization were discussed in detail with Leonard Edwards and he is willing to proceed.  2.  ASCAD -s/p remote multiple PCI's and ultimately underwent CABG with LIMA to LAD, SVG to distal RCA and SVG to ramus intermedius in 2017.   -His last cath in 2017 showed patent free RIMA to ramus intermedius, patent LIMA to LAD, patent SVG to PDA with diffuse 30% stenosis throughout the graft.  His LAD, PDA and ramus intermedius were patent distal to insertion of each of those grafts as well.  He had a 3 stents placed that covered essentially most of the left circumflex due to high-grade stenosis -Continue DAPT with aspirin 81 mg daily and Plavix 75 mg daily.  It was recommended that he continue DAPT for life -Continue Toprol-XL 50 mg daily with as needed refills as well as Repatha  3.  HTN -BP is adequately controlled on exam today -Continue prescription drug management with Toprol-XL 50 mg daily, HCTZ 25 mg daily, Zestril 20 mg daily, amlodipine 10 mg daily with as needed refills -Check bmet today  4.  HLD -LDL goal is less than 70 -FLP and ALT -Continue prescription drug management with Repatha  Time Spent: 25 minutes total time of encounter, including 15 minutes spent in face-to-face patient care on the date of this encounter. This time includes coordination of care and counseling regarding above mentioned problem list. Remainder of non-face-to-face time involved reviewing chart documents/testing relevant to the patient encounter and documentation in the medical record. I have independently reviewed documentation from referring provider  Medication Adjustments/Labs and Tests Ordered: Current medicines are reviewed at length with the patient today.  Concerns regarding medicines are outlined  above.  No orders of the defined types were placed in this encounter.  No orders of the defined types were placed in this encounter.   Signed, Fransico Him, MD  11/27/2020 4:13 PM    [facility]

## 2020-11-27 NOTE — Progress Notes (Signed)
Cardiology Office Note:    Date:  11/27/2020   ID:  [name], DOB 11-20-1955, MRN [id number]  PCP:  Ginger Organ., MD  Cardiologist:  Sherren Mocha, MD    Referring MD: Ginger Organ., MD   Chief Complaint  Patient presents with   Coronary Artery Disease   Hypertension   Hyperlipidemia   Chest Pain     History of Present Illness:    Leonard Edwards is a 65 y.o. male with a hx of ASCAD with multiple PCI procedures remotely and ultimately underwent CABG with LIMA to LAD, SVG to distal RCA and SVG to ramus intermedius in 2017.Marland Kitchen   He then underwent PCI with DES x3 to the left circumflex in September 2017.  He also has a history of hypertension, hyperlipidemia, hiatal hernia with GERD, fibromyalgia, esophageal stricture.  He presents today due to episodes of chest pain he has recently been experiencing.  He was at the beach this past weekend and he started having discomfort in his chest pain back behind his shoulder blade.  He walked 5 miles on the beach on Sat and he rode the bike for 10 miles last Friday and was swimming every day for a hour last week with no problems.    On Saturday he felt fine walking but then started feeling a vague discomfort in his chest that he has a hard time describing.  He rested since then but still continues to have a very mild tightness in his chest.  He says that it comes and goes and if he really pays attention he will notice mild tightness in his chest.  I cannot discern if it has really been constant for intermittent since the weekend.  He denies any DOE or SOB with the CP.  There was no associated nausea or diaphoresis.  It occurs with rest and with exertion.  He is under a lot of stress and is going through a separation and has stress a work.   Past Medical History:  Diagnosis Date   Benign prostatic hypertrophy    CAD (coronary artery disease)    a. DES to distal RCA in 09/2004; DES to Ramus Intermediate 10/2009 following abnormal  stress test b. 9/27 PCI with DES x3 to LCrx   Chronic bronchitis ([place])    "in the spring"   Esophageal stricture    Exertional angina ([place]) 10/31/2015   Fibromyalgia    Gastric polyp    GERD (gastroesophageal reflux disease)    Hiatal hernia    High cholesterol    History of gout    Hyperlipidemia    Hypertension    Obesity    Pneumonia    hx of    Past Surgical History:  Procedure Laterality Date   CARDIAC CATHETERIZATION N/A 03/29/2015   Procedure: Left Heart Cath and Coronary Angiography;  Surgeon: Sherren Mocha, MD;  Location: [facility];  Service: Cardiovascular;  Laterality: N/A;   CARDIAC CATHETERIZATION N/A 10/31/2015   Procedure: Left Heart Cath and Cors/Grafts Angiography;  Surgeon: Sherren Mocha, MD;  Location: [facility] [place] [facility];  Service: Cardiovascular;  Laterality: N/A;   CARDIAC CATHETERIZATION N/A 10/31/2015   Procedure: Coronary Stent Intervention;  Surgeon: Sherren Mocha, MD;  Location: [facility];  Service: Cardiovascular;  Laterality: N/A;   CHOLECYSTECTOMY N/A 07/07/2017   Procedure: LAPAROSCOPIC CHOLECYSTECTOMY WITH INTRAOPERATIVE CHOLANGIOGRAM;  Surgeon: Excell Seltzer, MD;  [facility];  Service: General;  Laterality: N/A;   COLONOSCOPY W/  POLYPECTOMY     CORONARY ANGIOPLASTY WITH STENT PLACEMENT  09/2004-10/2009   DES to distal RCA in 09/2004; DES to Ramus Intermediate 10/2009 following abnormal stress test   CORONARY ARTERY BYPASS GRAFT N/A 04/06/2015   Procedure: CORONARY ARTERY BYPASS GRAFTING (CABG) x three,  using bilateral mammaries, and right leg greater saphenous vein harvested endoscopically;  Surgeon: Gaye Pollack, MD;  Location: [facility];  Service: Open Heart Surgery;  Laterality: N/A;   ESOPHAGOGASTRODUODENOSCOPY (EGD) WITH ESOPHAGEAL DILATION     KNEE SURGERY  04/2020   SHOULDER ARTHROSCOPY Right 2015   scar tissue   TEE WITHOUT CARDIOVERSION N/A 04/06/2015   Procedure: TRANSESOPHAGEAL ECHOCARDIOGRAM (TEE);  Surgeon:  Gaye Pollack, MD;  Location: [facility];  Service: Open Heart Surgery;  Laterality: N/A;   ULTRASOUND GUIDANCE FOR VASCULAR ACCESS  03/29/2015   Procedure: Ultrasound Guidance For Vascular Access;  Surgeon: Sherren Mocha, MD;  Location: [facility];  Service: Cardiovascular;;   UMBILICAL HERNIA REPAIR N/A 07/07/2017   Procedure: HERNIA REPAIR UMBILICAL ADULT;  Surgeon: Excell Seltzer, MD;  [facility];  Service: General;  Laterality: N/A;    Current Medications: Current Meds  Medication Sig   allopurinol (ZYLOPRIM) 300 MG tablet Take 300 mg by mouth daily.   amLODipine (NORVASC) 10 MG tablet TAKE 1 TABLET(10 MG) BY MOUTH DAILY AT BEDTIME   aspirin EC 81 MG tablet Take 81 mg by mouth daily.   clopidogrel (PLAVIX) 75 MG tablet TAKE 1 TABLET(75 MG) BY MOUTH DAILY WITH BREAKFAST   Coenzyme Q10 (CO Q 10) 100 MG CAPS Take 100 mg by mouth daily.   colchicine 0.6 MG tablet Take 0.6 mg by mouth as needed (gout flare up).    diphenhydramine-acetaminophen (TYLENOL PM) 25-500 MG TABS tablet Take 0.5 tablets by mouth at bedtime as needed (sleep).   Evolocumab (REPATHA SURECLICK) 332 MG/ML SOAJ INJECT 1 PEN UNDER THE SKIN EVERY 14 DAYS   fluticasone (FLONASE) 50 MCG/ACT nasal spray Place 2 sprays into both nostrils daily as needed for allergies or rhinitis.   hydrochlorothiazide (HYDRODIURIL) 25 MG tablet TAKE 1 TABLET(25 MG) BY MOUTH DAILY   indomethacin (INDOCIN) 50 MG capsule TAKE 1 CAPSULE BY MOUTH THREE TIMES DAILY AS NEEDED   levocetirizine (XYZAL) 5 MG tablet Take 5 mg by mouth daily as needed for allergies.   lisinopril (ZESTRIL) 20 MG tablet TAKE 1 TABLET(20 MG) BY MOUTH DAILY   metoprolol succinate (TOPROL-XL) 50 MG 24 hr tablet Take 1 tablet (50 mg total) by mouth daily. Take with or immediately following a meal.   montelukast (SINGULAIR) 10 MG tablet Take 10 mg by mouth at bedtime.   nitroGLYCERIN (NITROSTAT) 0.4 MG SL tablet Place 1 tablet (0.4 mg total) under the tongue every 5  (five) minutes as needed for chest pain.   pantoprazole (PROTONIX) 40 MG tablet TAKE 1 TABLET(40 MG) BY MOUTH DAILY   PROAIR HFA 108 (90 Base) MCG/ACT inhaler Inhale 1 puff into the lungs every 4 (four) hours as needed for wheezing (allergies).    sildenafil (VIAGRA) 100 MG tablet Take 100 mg by mouth as needed.     Allergies:   Patient has no known allergies.   Social History   Socioeconomic History   Marital status: Married    Spouse name: Not on file   Number of children: 4   Years of education: College   Highest education level: Not on file  Occupational History    Employer: [employer]  Tobacco Use  Smoking status: Never   Smokeless tobacco: Never  Vaping Use   Vaping Use: Never used  Substance and Sexual Activity   Alcohol use: Yes    Alcohol/week: 5.0 standard drinks    Types: 5 Glasses of wine per week   Drug use: No   Sexual activity: Not Currently  Other Topics Concern   Not on file  Social History Narrative   Not on file   Social Determinants of Health   Financial Resource Strain: Not on file  Food Insecurity: Not on file  Transportation Needs: Not on file  Physical Activity: Not on file  Stress: Not on file  Social Connections: Not on file     Family History: The patient's family history includes Bladder Cancer in his mother; COPD in his mother; Diabetes type II in his sister; Heart attack in his father; Hypertension in his father; Lupus in his sister. There is no history of Colon cancer, Pancreatic cancer, or Stomach cancer.  ROS:   Please see the history of present illness.    ROS  All other systems reviewed and negative.   EKGs/Labs/Other Studies Reviewed:    The following studies were reviewed today: EKG  EKG:  EKG is  ordered today.  The ekg ordered today demonstrates Sinus bradycardia at 57bpm with nonspecific T wave abnormality  Recent Labs: 02/25/2020: BUN 8; Creatinine, Ser 0.84; Potassium 3.8; Sodium 140   Recent Lipid Panel     Component Value Date/Time   CHOL 108 11/16/2019 0901   TRIG 136 11/16/2019 0901   TRIG 147 02/10/2006 0856   HDL 40 11/16/2019 0901   CHOLHDL 2.7 11/16/2019 0901   CHOLHDL 3.9 12/19/2015 0906   VLDL 27 12/19/2015 0906   LDLCALC 44 11/16/2019 0901   LDLDIRECT 68.2 10/25/2010 1015    Physical Exam:    VS:  BP 136/88   Pulse (!) 57   Ht 5' 10.5" (1.791 m)   Wt 242 lb 3.2 oz (109.9 kg)   SpO2 93%   BMI 34.26 kg/m     Wt Readings from Last 3 Encounters:  11/27/20 242 lb 3.2 oz (109.9 kg)  11/20/20 248 lb (112.5 kg)  03/29/20 252 lb 12.8 oz (114.7 kg)     GEN:  Well nourished, well developed in no acute distress HEENT: Normal NECK: No JVD; No carotid bruits LYMPHATICS: No lymphadenopathy CARDIAC: RRR, no murmurs, rubs, gallops RESPIRATORY:  Clear to auscultation without rales, wheezing or rhonchi  ABDOMEN: Soft, non-tender, non-distended MUSCULOSKELETAL:  No edema; No deformity  SKIN: Warm and dry NEUROLOGIC:  Alert and oriented x 3 PSYCHIATRIC:  Normal affect   ASSESSMENT:    1. Exertional angina (HCC)   2. Coronary artery disease due to lipid rich plaque   3. Essential hypertension   4. Pure hypercholesterolemia    PLAN:    In order of problems listed above:  Chest pain -his symptoms are concerning and have been going on for several days and seem to be off and on but currently has some discomfort in the office now -EKG is nonischemic -he certainly has significant CAD and may have progression of CAD.  He also has GERD and started taking Protonix yesterday.  He thinks his discomfort is more like his angina -given that he is currently having some chest discomfort at rest, I think we need to send him to the ER for admission and make NPO after MN for LHC in am to redefine coronary anatomy -will check hsTrop x 2 -start IV  Heparin gtt  -continue current cardiac meds -Shared Decision Making/Informed Consent{ The risks [stroke (1 in 1000), death (1 in 1000), kidney  failure [usually temporary] (1 in 500), bleeding (1 in 200), allergic reaction [possibly serious] (1 in 200)], benefits (diagnostic support and management of coronary artery disease) and alternatives of a cardiac catheterization were discussed in detail with Mr. Renteria and he is willing to proceed.  2.  ASCAD -s/p remote multiple PCI's and ultimately underwent CABG with LIMA to LAD, SVG to distal RCA and SVG to ramus intermedius in 2017.   -His last cath in 2017 showed patent free RIMA to ramus intermedius, patent LIMA to LAD, patent SVG to PDA with diffuse 30% stenosis throughout the graft.  His LAD, PDA and ramus intermedius were patent distal to insertion of each of those grafts as well.  He had a 3 stents placed that covered essentially most of the left circumflex due to high-grade stenosis -Continue DAPT with aspirin 81 mg daily and Plavix 75 mg daily.  It was recommended that he continue DAPT for life -Continue Toprol-XL 50 mg daily with as needed refills as well as Repatha  3.  HTN -BP is adequately controlled on exam today -Continue prescription drug management with Toprol-XL 50 mg daily, HCTZ 25 mg daily, Zestril 20 mg daily, amlodipine 10 mg daily with as needed refills -Check bmet today  4.  HLD -LDL goal is less than 70 -FLP and ALT -Continue prescription drug management with Repatha  Time Spent: 25 minutes total time of encounter, including 15 minutes spent in face-to-face patient care on the date of this encounter. This time includes coordination of care and counseling regarding above mentioned problem list. Remainder of non-face-to-face time involved reviewing chart documents/testing relevant to the patient encounter and documentation in the medical record. I have independently reviewed documentation from referring provider  Medication Adjustments/Labs and Tests Ordered: Current medicines are reviewed at length with the patient today.  Concerns regarding medicines are outlined  above.  No orders of the defined types were placed in this encounter.  No orders of the defined types were placed in this encounter.   Signed, Fransico Him, MD  11/27/2020 4:13 PM    [facility]

## 2020-11-28 ENCOUNTER — Telehealth: Payer: Self-pay | Admitting: Physician Assistant

## 2020-11-28 ENCOUNTER — Encounter (HOSPITAL_COMMUNITY)
Admission: AD | Disposition: A | Discharge status: Home / Self Care | Payer: Self-pay | Source: Ambulatory Visit | Attending: Cardiology

## 2020-11-28 ENCOUNTER — Other Ambulatory Visit (HOSPITAL_COMMUNITY): Payer: Self-pay

## 2020-11-28 ENCOUNTER — Telehealth: Payer: Self-pay | Admitting: Cardiovascular Disease

## 2020-11-28 ENCOUNTER — Observation Stay (HOSPITAL_BASED_OUTPATIENT_CLINIC_OR_DEPARTMENT_OTHER): Payer: Commercial Managed Care - PPO

## 2020-11-28 ENCOUNTER — Encounter (HOSPITAL_COMMUNITY): Payer: Self-pay | Admitting: Cardiovascular Disease

## 2020-11-28 DIAGNOSIS — R079 Chest pain, unspecified: Secondary | ICD-10-CM | POA: Diagnosis not present

## 2020-11-28 DIAGNOSIS — Z951 Presence of aortocoronary bypass graft: Secondary | ICD-10-CM | POA: Diagnosis not present

## 2020-11-28 DIAGNOSIS — I25118 Atherosclerotic heart disease of native coronary artery with other forms of angina pectoris: Secondary | ICD-10-CM

## 2020-11-28 DIAGNOSIS — Z20822 Contact with and (suspected) exposure to covid-19: Secondary | ICD-10-CM | POA: Diagnosis not present

## 2020-11-28 DIAGNOSIS — I2510 Atherosclerotic heart disease of native coronary artery without angina pectoris: Secondary | ICD-10-CM | POA: Diagnosis not present

## 2020-11-28 DIAGNOSIS — I259 Chronic ischemic heart disease, unspecified: Secondary | ICD-10-CM | POA: Diagnosis not present

## 2020-11-28 DIAGNOSIS — I25718 Atherosclerosis of autologous vein coronary artery bypass graft(s) with other forms of angina pectoris: Secondary | ICD-10-CM | POA: Diagnosis not present

## 2020-11-28 DIAGNOSIS — I10 Essential (primary) hypertension: Secondary | ICD-10-CM | POA: Diagnosis not present

## 2020-11-28 HISTORY — PX: LEFT HEART CATH AND CORS/GRAFTS ANGIOGRAPHY: CATH118250

## 2020-11-28 LAB — BASIC METABOLIC PANEL
Anion gap: 7 (ref 5–15)
BUN: 9 mg/dL (ref 8–23)
CO2: 28 mmol/L (ref 22–32)
Calcium: 8.9 mg/dL (ref 8.9–10.3)
Chloride: 104 mmol/L (ref 98–111)
Creatinine, Ser: 0.9 mg/dL (ref 0.61–1.24)
GFR, Estimated: >60 mL/min (ref >60)
Glucose, Bld: 97 mg/dL (ref 70–99)
Potassium: 3.2 mmol/L — ABNORMAL LOW (ref 3.5–5.1)
Sodium: 139 mmol/L (ref 135–145)

## 2020-11-28 LAB — LIPID PANEL
Cholesterol: 108 mg/dL (ref 0–200)
HDL: 38 mg/dL — ABNORMAL LOW (ref >40)
LDL Cholesterol: 47 mg/dL (ref 0–99)
Total CHOL/HDL Ratio: 2.8 RATIO
Triglycerides: 113 mg/dL (ref <150)
VLDL: 23 mg/dL (ref 0–40)

## 2020-11-28 LAB — CBC
HCT: 46.6 % (ref 39.0–52.0)
Hemoglobin: 16.2 g/dL (ref 13.0–17.0)
MCH: 31 pg (ref 26.0–34.0)
MCHC: 34.8 g/dL (ref 30.0–36.0)
MCV: 89.3 fL (ref 80.0–100.0)
Platelets: 274 10*3/uL (ref 150–400)
RBC: 5.22 MIL/uL (ref 4.22–5.81)
RDW: 12.3 % (ref 11.5–15.5)
WBC: 9.8 10*3/uL (ref 4.0–10.5)
nRBC: 0 % (ref 0.0–0.2)

## 2020-11-28 LAB — ECHOCARDIOGRAM COMPLETE
Area-P 1/2: 1.64 cm2
Calc EF: 55.5 %
Height: 70.5 in
S' Lateral: 3.5 cm
Single Plane A2C EF: 49.6 %
Single Plane A4C EF: 56.4 %
Weight: 3862.46 oz

## 2020-11-28 LAB — RESP PANEL BY RT-PCR (FLU A&B, COVID) ARPGX2
Influenza A by PCR: NEGATIVE
Influenza B by PCR: NEGATIVE
SARS Coronavirus 2 by RT PCR: NEGATIVE

## 2020-11-28 LAB — HEPARIN LEVEL (UNFRACTIONATED): Heparin Unfractionated: 0.13 IU/mL — ABNORMAL LOW (ref 0.30–0.70)

## 2020-11-28 SURGERY — LEFT HEART CATH AND CORS/GRAFTS ANGIOGRAPHY
Anesthesia: LOCAL

## 2020-11-28 MED ORDER — ACETAMINOPHEN 325 MG PO TABS: 650.0000 mg | ORAL_TABLET | ORAL | Status: DC | PRN

## 2020-11-28 MED ORDER — MIDAZOLAM HCL 2 MG/2ML IJ SOLN
INTRAMUSCULAR | Status: AC
  Filled 2020-11-28: qty 2

## 2020-11-28 MED ORDER — FENTANYL CITRATE (PF) 100 MCG/2ML IJ SOLN
INTRAMUSCULAR | Status: DC | PRN
  Administered 2020-11-28: 50 ug via INTRAVENOUS

## 2020-11-28 MED ORDER — SODIUM CHLORIDE 0.9% FLUSH: 3.0000 mL | Freq: Two times a day (BID) | INTRAVENOUS | Status: DC

## 2020-11-28 MED ORDER — HEPARIN (PORCINE) IN NACL 1000-0.9 UT/500ML-% IV SOLN
INTRAVENOUS | Status: AC
  Filled 2020-11-28: qty 1000

## 2020-11-28 MED ORDER — HYDRALAZINE HCL 20 MG/ML IJ SOLN
INTRAMUSCULAR | Status: DC | PRN
  Administered 2020-11-28: 10 mg via INTRAVENOUS

## 2020-11-28 MED ORDER — FENTANYL CITRATE (PF) 100 MCG/2ML IJ SOLN
INTRAMUSCULAR | Status: AC
  Filled 2020-11-28: qty 2

## 2020-11-28 MED ORDER — LABETALOL HCL 5 MG/ML IV SOLN: 10.0000 mg | INTRAVENOUS | Status: DC | PRN

## 2020-11-28 MED ORDER — SODIUM CHLORIDE 0.9% FLUSH: 3.0000 mL | INTRAVENOUS | Status: DC | PRN

## 2020-11-28 MED ORDER — LIDOCAINE HCL (PF) 1 % IJ SOLN
INTRAMUSCULAR | Status: AC
  Filled 2020-11-28: qty 30

## 2020-11-28 MED ORDER — HEPARIN BOLUS VIA INFUSION
2000.0000 [IU] | Freq: Once | INTRAVENOUS | Status: AC
  Administered 2020-11-28: 2000 [IU] via INTRAVENOUS
  Filled 2020-11-28: qty 2000

## 2020-11-28 MED ORDER — HEPARIN (PORCINE) IN NACL 1000-0.9 UT/500ML-% IV SOLN
INTRAVENOUS | Status: DC | PRN
  Administered 2020-11-28 (×2): 500 mL

## 2020-11-28 MED ORDER — HYDRALAZINE HCL 20 MG/ML IJ SOLN: 10.0000 mg | INTRAMUSCULAR | Status: DC | PRN

## 2020-11-28 MED ORDER — ONDANSETRON HCL 4 MG/2ML IJ SOLN: 4.0000 mg | Freq: Four times a day (QID) | INTRAMUSCULAR | Status: DC | PRN

## 2020-11-28 MED ORDER — ISOSORBIDE MONONITRATE ER 30 MG PO TB24
30.0000 mg | ORAL_TABLET | Freq: Every day | ORAL | 11 refills | Status: DC
  Filled 2020-11-28: qty 30, 30d supply, fill #0

## 2020-11-28 MED ORDER — IOHEXOL 350 MG/ML SOLN
INTRAVENOUS | Status: DC | PRN
  Administered 2020-11-28: 120 mL via INTRA_ARTERIAL

## 2020-11-28 MED ORDER — MIDAZOLAM HCL 2 MG/2ML IJ SOLN
INTRAMUSCULAR | Status: DC | PRN
  Administered 2020-11-28: 2 mg via INTRAVENOUS

## 2020-11-28 MED ORDER — POTASSIUM CHLORIDE CRYS ER 20 MEQ PO TBCR
40.0000 meq | EXTENDED_RELEASE_TABLET | Freq: Once | ORAL | Status: AC
  Administered 2020-11-28: 40 meq via ORAL
  Filled 2020-11-28: qty 2

## 2020-11-28 MED ORDER — ISOSORBIDE MONONITRATE ER 30 MG PO TB24
30.0000 mg | ORAL_TABLET | Freq: Every day | ORAL | Status: DC
  Administered 2020-11-28: 30 mg via ORAL
  Filled 2020-11-28: qty 1

## 2020-11-28 MED ORDER — SODIUM CHLORIDE 0.9 % IV SOLN: 250.0000 mL | INTRAVENOUS | Status: DC | PRN

## 2020-11-28 MED ORDER — PERFLUTREN LIPID MICROSPHERE
1.0000 mL | INTRAVENOUS | Status: DC | PRN
  Administered 2020-11-28: 4 mL via INTRAVENOUS
  Filled 2020-11-28: qty 10

## 2020-11-28 MED ORDER — LIDOCAINE HCL (PF) 1 % IJ SOLN
INTRAMUSCULAR | Status: DC | PRN
  Administered 2020-11-28: 18 mL

## 2020-11-28 MED ORDER — HYDRALAZINE HCL 20 MG/ML IJ SOLN
INTRAMUSCULAR | Status: AC
  Filled 2020-11-28: qty 1

## 2020-11-28 MED ORDER — SODIUM CHLORIDE 0.9 % IV SOLN: INTRAVENOUS | Status: DC

## 2020-11-28 SURGICAL SUPPLY — 12 items
CATH INFINITI 5 FR IM (CATHETERS) ×1 IMPLANT
CATH INFINITI 5 FR RCB (CATHETERS) ×1 IMPLANT
CATH INFINITI 5FR JL5 (CATHETERS) ×1 IMPLANT
CATH INFINITI 5FR MULTPACK ANG (CATHETERS) ×1 IMPLANT
CLOSURE MYNX CONTROL 5F (Vascular Products) ×1 IMPLANT
KIT HEART LEFT (KITS) ×3 IMPLANT
PACK CARDIAC CATHETERIZATION (CUSTOM PROCEDURE TRAY) ×3 IMPLANT
SHEATH PINNACLE 5F 10CM (SHEATH) ×1 IMPLANT
TRANSDUCER W/STOPCOCK (MISCELLANEOUS) ×3 IMPLANT
TUBING CIL FLEX 10 FLL-RA (TUBING) ×3 IMPLANT
WIRE EMERALD 3MM-J .025X260CM (WIRE) ×1 IMPLANT
WIRE EMERALD 3MM-J .035X150CM (WIRE) ×1 IMPLANT

## 2020-11-28 NOTE — Progress Notes (Signed)
ANTICOAGULATION CONSULT NOTE  Pharmacy Consult for Heparin Indication: chest pain/ACS  No Known Allergies  Patient Measurements: Height: 5' 10.5" (179.1 cm) Weight: 109.8 kg (242 lb 1 oz) IBW/kg (Calculated) : 74.15 Heparin Dosing Weight: 97.8 kg  Vital Signs: Temp: 98.7 F (37.1 C) (10/25 1945) Temp Source: Oral (10/25 1945) BP: 160/83 (10/25 1945) Pulse Rate: 56 (10/25 1945)  Labs: Recent Labs    11/27/20 1932 11/27/20 2153 11/28/20 0230  HGB 16.9  --  16.2  HCT 48.1  --  46.6  PLT 288  --  274  HEPARINUNFRC  --   --  0.13*  CREATININE 0.92  --   --   TROPONINIHS 9 10  --     Estimated Creatinine Clearance: 101.4 mL/min (by C-G formula based on SCr of 0.92 mg/dL).  Assessment: 65 y.o. male with chest pain for heparin   Goal of Therapy:  Heparin level 0.3-0.7 units/ml Monitor platelets by anticoagulation protocol: Yes   Plan:  Heparin 2000 units IV bolus, then increase heparin  1600 units/hr Follow up after cath today   Phillis Knack, PharmD, BCPS 11/28/2020, 3:11 AM

## 2020-11-28 NOTE — Progress Notes (Signed)
Patient taken to cath lab at 0820hrs.

## 2020-11-28 NOTE — Telephone Encounter (Signed)
Patient's daughter states the patient has court on Friday, because he is getting a separation from his wife. She says the patient had a procedure and is being discharged today, but they found his symptoms are stress induced. She states she is concerned going to court on Friday may cause more stress for him. She would like to know if the patient can go to court or if she should not.

## 2020-11-28 NOTE — Telephone Encounter (Signed)
Returned call to patient's daughter, Leonard Edwards, and advised that I do not have her listed on patient's DPR. Patient is with daughter and got on the phone. He states Dr. Claiborne Billings told him he could proceed with court hearing on Friday. I asked the patient how he feels about it and he states that he would like to proceed. He states the findings from today's cardiac cath were encouraging and have decreased his stress level. Daughter states she is concerned about his elevated BP but maybe knowing that the CAD is stable will lower patient's BP. I advised him to limit sodium intake in addition to anti-hypertensive medications for good BP management. Patient verbalized appreciation for all the great care and attention he has received since yesterday. He thanked me for the call.

## 2020-11-28 NOTE — Discharge Summary (Addendum)
Discharge Summary    Patient ID: Leonard Edwards MRN: [id number]; DOB: 05-10-1955  Admit date: 11/27/2020 Discharge date: 11/28/2020  PCP:  Ginger Organ., MD   [facility] Providers Cardiologist:  Sherren Mocha, MD   {   Discharge Diagnoses    Principal Problem:   Chest pain Active Problems:   Hyperlipemia   Essential hypertension   CAD  Diagnostic Studies/Procedures    LEFT HEART CATH AND CORS/GRAFTS ANGIOGRAPHY   Conclusion      Mid LM to Dist LM lesion is 95% stenosed.   Dist LM to Ost LAD lesion is 95% stenosed.   Ost Cx to Prox Cx lesion is 65% stenosed.   Ost LAD to Prox LAD lesion is 75% stenosed.   Prox RCA-1 lesion is 90% stenosed.   Mid RCA lesion is 85% stenosed.   Mid RCA to Dist RCA lesion is 100% stenosed.   RPAV lesion is 100% stenosed.   Origin lesion is 30% stenosed.   Ramus lesion is 10% stenosed.   Prox RCA-2 lesion is 20% stenosed.   Previously placed Prox Cx to Dist Cx stent (unknown type) is  widely patent.   There is mild left ventricular systolic dysfunction.   The left ventricular ejection fraction is 45-50% by visual estimate.   Severe multivessel native CAD with 90-95% eccentric distal left main stenosis, 95% ostial LAD prior stenting of the proximal LAD with narrowing of 70 to 80% and evidence for competitive filling of the LAD via the LIMA graft;  proximal ramus stent with competitive filling via the free RIMA graft to the ramus; diffuse 65 to 70% ostial proximal circumflex stenosis with widely patent tandem stents from the proximal to distal circumflex vessel with collateralization to the PLA/distal RCA; and diffuse native RCA disease with 90% proximal 80% mid with total occlusion proximal to the acute margin.   Patent LIMA graft supplying the LAD.   Patent free RIMA graft supplying the ramus intermediate vessel.  There is also filling of the native circumflex retrograde from the ramus.   Patent SVG supplying the PDA.  There  is 30% smooth proximal stenosis.  There is some sluggish flow in the midportion of the graft with out significant stenosis.   Mild LV dysfunction with EF estimated 45 to 50%.  LVEDP 19 mmHg.   RECOMMENDATIONS: Continue DAPT long-term.  The grafts remain patent and not significantly changed from the prior catheterization.  Continue aggressive medical therapy and will add nitrates to the patient's beta-blocker, amlodipine, and if recurrent symptomatology develops may be a candidate for ranolazine.  Continue aggressive lipid-lowering therapy with PCSK9 inhibition in addition to statin therapy.    Diagnostic Dominance: Right   History of Present Illness     Leonard Edwards is a 65 y.o. male with a hx of ASCAD with multiple PCI procedures remotely and ultimately underwent CABG, hypertension, hyperlipidemia, hiatal hernia with GERD, fibromyalgia, and esophageal stricture admitted from clinic.   Hx of CAD with multiple PCI procedures remotely and ultimately underwent CABG with LIMA to LAD, SVG to distal RCA and SVG to ramus intermedius in 2017.Marland Kitchen   He then underwent PCI with DES x3 to the left circumflex in September 2017.   He was seen in clinic by DOD 11/27/20 for episodes of chest pain he has recently been experiencing.  He was at the beach this past weekend and he started having discomfort in his chest pain back behind his shoulder blade.  He walked 5 miles  on the beach on Sat and he rode the bike for miles last Friday and was swimming every day for a hour last week with no problems.     On Saturday he felt fine walking but then started feeling a vague discomfort in his chest that he has a hard time describing.  He rested since then but still continues to have a very mild tightness in his chest.  He says that it comes and goes and if he really pays attention he will notice mild tightness in his chest.  He denies any DOE or SOB with the CP.  There was no associated nausea or diaphoresis.  It occurs with  rest and with exertion.  He is under a lot of stress and is going through a separation and has stress a work.   EKG non ischemic. Given concern for unstable angina he is directly admitted.     Hospital Course     Consultants: None  He was admitted and started on IV heparin. No chest pain while admitted. Cardiac cath as above showing patent grafts which not significantly changed from prior cath. Recommended medical therapy. He is already on DAPT indefinitely. He will continue home medications with addition of Imdur 30mg  qd. No post cath complications.   Did the patient have an acute coronary syndrome (MI, NSTEMI, STEMI, etc) this admission?:  No                               Did the patient have a percutaneous coronary intervention (stent / angioplasty)?:  No.    Discharge Vitals Blood pressure (!) 145/69, pulse (!) 50, temperature 98.6 F (37 C), temperature source Oral, resp. rate 17, height 5' 10.5" (1.791 m), weight 109.5 kg, SpO2 95 %.  Filed Weights   11/27/20 1927 11/27/20 1945 11/28/20 0434  Weight: 109.8 kg 109.8 kg 109.5 kg    Labs & Radiologic Studies    CBC Recent Labs    11/27/20 1932 11/28/20 0230  WBC 10.4 9.8  NEUTROABS 6.5  --   HGB 16.9 16.2  HCT 48.1 46.6  MCV 89.2 89.3  PLT 288 269   Basic Metabolic Panel Recent Labs    11/27/20 1932 11/28/20 0230  NA 138 139  K 3.7 3.2*  CL 100 104  CO2 28 28  GLUCOSE 96 97  BUN 11 9  CREATININE 0.92 0.90  CALCIUM 9.2 8.9   Liver Function Tests Recent Labs    11/27/20 1932  AST 24  ALT 22  ALKPHOS 63  BILITOT 2.0*  PROT 6.9  ALBUMIN 3.6   No results for input(s): LIPASE, AMYLASE in the last 72 hours. High Sensitivity Troponin:   Recent Labs  Lab 11/27/20 1932 11/27/20 2153  TROPONINIHS 9 10     Fasting Lipid Panel Recent Labs    11/28/20 0230  CHOL 108  HDL 38*  LDLCALC 47  TRIG 113  CHOLHDL 2.8   _____________  CARDIAC CATHETERIZATION  Result Date: 11/28/2020   Mid LM to Dist LM  lesion is 95% stenosed.   Dist LM to Ost LAD lesion is 95% stenosed.   Ost Cx to Prox Cx lesion is 65% stenosed.   Ost LAD to Prox LAD lesion is 75% stenosed.   Prox RCA-1 lesion is 90% stenosed.   Mid RCA lesion is 85% stenosed.   Mid RCA to Dist RCA lesion is 100% stenosed.   RPAV lesion is 100%  stenosed.   Origin lesion is 30% stenosed.   Ramus lesion is 10% stenosed.   Prox RCA-2 lesion is 20% stenosed.   Previously placed Prox Cx to Dist Cx stent (unknown type) is  widely patent.   There is mild left ventricular systolic dysfunction.   The left ventricular ejection fraction is 45-50% by visual estimate. Severe multivessel native CAD with 90-95% eccentric distal left main stenosis, 95% ostial LAD prior stenting of the proximal LAD with narrowing of 70 to 80% and evidence for competitive filling of the LAD via the LIMA graft;  proximal ramus stent with competitive filling via the free RIMA graft to the ramus; diffuse 65 to 70% ostial proximal circumflex stenosis with widely patent tandem stents from the proximal to distal circumflex vessel with collateralization to the PLA/distal RCA; and diffuse native RCA disease with 90% proximal 80% mid with total occlusion proximal to the acute margin. Patent LIMA graft supplying the LAD. Patent free RIMA graft supplying the ramus intermediate vessel.  There is also filling of the native circumflex retrograde from the ramus. Patent SVG supplying the PDA.  There is 30% smooth proximal stenosis.  There is some sluggish flow in the midportion of the graft with out significant stenosis. Mild LV dysfunction with EF estimated 45 to 50%.  LVEDP 19 mmHg. RECOMMENDATIONS: Continue DAPT long-term.  The grafts remain patent and not significantly changed from the prior catheterization.  Continue aggressive medical therapy and will add nitrates to the patient's beta-blocker, amlodipine, and if recurrent symptomatology develops may be a candidate for ranolazine.  Continue aggressive  lipid-lowering therapy with PCSK9 inhibition in addition to statin therapy.   Disposition   Pt is being discharged home today in good condition.  Follow-up Plans & Appointments     Follow-up Information     Charlie Pitter, PA-C Follow up on 12/25/2020.   Specialties: Cardiology, Radiology Why: @2 :20pm for hospital follow up with Dr. Antionette Char PA Contact information: [address] [place] [address] [phone]                Discharge Instructions     Diet - low sodium heart healthy   Complete by: As directed    Discharge instructions   Complete by: As directed    No driving for 48 hours. No lifting over 5 lbs for 1 week. No sexual activity for 1 week.  Keep procedure site clean & dry. If you notice increased pain, swelling, bleeding or pus, call/return!  You may shower, but no soaking baths/hot tubs/pools for 1 week.   Increase activity slowly   Complete by: As directed        Discharge Medications   Allergies as of 11/28/2020   No Known Allergies      Medication List     TAKE these medications    allopurinol 300 MG tablet Commonly known as: ZYLOPRIM Take 300 mg by mouth daily.   amLODipine 10 MG tablet Commonly known as: NORVASC TAKE 1 TABLET(10 MG) BY MOUTH DAILY AT BEDTIME What changed: See the new instructions.   aspirin EC 81 MG tablet Take 81 mg by mouth daily.   clopidogrel 75 MG tablet Commonly known as: PLAVIX TAKE 1 TABLET(75 MG) BY MOUTH DAILY WITH BREAKFAST What changed: See the new instructions.   Co Q 10 100 MG Caps Take 100 mg by mouth daily.   colchicine 0.6 MG tablet Take 0.6 mg by mouth daily as needed (gout flare up).   fluticasone  50 MCG/ACT nasal spray Commonly known as: FLONASE Place 2 sprays into both nostrils daily as needed for allergies or rhinitis.   hydrochlorothiazide 25 MG tablet Commonly known as: HYDRODIURIL TAKE 1 TABLET(25 MG) BY MOUTH DAILY What changed: See the new  instructions.   indomethacin 50 MG capsule Commonly known as: INDOCIN TAKE 1 CAPSULE BY MOUTH THREE TIMES DAILY AS NEEDED   isosorbide mononitrate 30 MG 24 hr tablet Commonly known as: IMDUR Take 1 tablet (30 mg total) by mouth daily. Start taking on: November 29, 2020   levocetirizine 5 MG tablet Commonly known as: XYZAL Take 5 mg by mouth daily as needed for allergies.   lisinopril 20 MG tablet Commonly known as: ZESTRIL TAKE 1 TABLET(20 MG) BY MOUTH DAILY What changed: See the new instructions.   metoprolol succinate 50 MG 24 hr tablet Commonly known as: TOPROL-XL Take 1 tablet (50 mg total) by mouth daily. Take with or immediately following a meal.   montelukast 10 MG tablet Commonly known as: SINGULAIR Take 10 mg by mouth daily.   nitroGLYCERIN 0.4 MG SL tablet Commonly known as: NITROSTAT Place 1 tablet (0.4 mg total) under the tongue every 5 (five) minutes as needed for chest pain.   pantoprazole 40 MG tablet Commonly known as: PROTONIX TAKE 1 TABLET(40 MG) BY MOUTH DAILY What changed:  how much to take how to take this when to take this reasons to take this additional instructions   Repatha SureClick 578 MG/ML Soaj Generic drug: Evolocumab INJECT 1 PEN UNDER THE SKIN EVERY 14 DAYS What changed: See the new instructions.           Outstanding Labs/Studies   None  Duration of Discharge Encounter   Greater than 30 minutes including physician time.  Mahalia Longest [address], PA 11/28/2020, 12:24 PM  Personally seen and examined. Agree with APP. Stable LHG with unclear chest tightness.  Will leave on Imdur and close follow up. See progress note for full details.  Rudean Haskell, MD [address] [place], [place] [address] [phone]  2:56 PM

## 2020-11-28 NOTE — Interval H&P Note (Signed)
Cath Lab Visit (complete for each Cath Lab visit)  Clinical Evaluation Leading to the Procedure:   ACS: No.  Non-ACS:    Anginal Classification: CCS II  Anti-ischemic medical therapy: Maximal Therapy (2 or more classes of medications)  Non-Invasive Test Results: No non-invasive testing performed  Prior CABG: Previous CABG      History and Physical Interval Note:  11/28/2020 8:35 AM  Leonard Edwards  has presented today for surgery, with the diagnosis of unstable angina.  The various methods of treatment have been discussed with the patient and family. After consideration of risks, benefits and other options for treatment, the patient has consented to  Procedure(s): LEFT HEART CATH AND CORS/GRAFTS ANGIOGRAPHY (N/A) as a surgical intervention.  The patient's history has been reviewed, patient examined, no change in status, stable for surgery.  I have reviewed the patient's chart and labs.  Questions were answered to the patient's satisfaction.     Shelva Majestic

## 2020-11-28 NOTE — Progress Notes (Signed)
Patient returned from cath lab at 1006hrs.  Right groin site level zero with +2 pedal pulse.  No c/o pain.  Reviewed post cath instructions.

## 2020-11-28 NOTE — Progress Notes (Addendum)
Progress Note  Patient Name: Leonard Edwards Date of Encounter: 11/28/2020  Primary Cardiologist: Sherren Mocha, MD   Subjective   No chest pain presently.  NPO for LHC.  Based on results aware of possible DC today, tomorrow  or later.  Inpatient Medications    Scheduled Meds:  [MAR Hold] allopurinol  300 mg Oral Daily   [MAR Hold] amLODipine  10 mg Oral Daily   [MAR Hold] aspirin EC  81 mg Oral Daily   [MAR Hold] clopidogrel  75 mg Oral Daily   [MAR Hold] hydrochlorothiazide  25 mg Oral Daily   [MAR Hold] lisinopril  20 mg Oral Daily   [MAR Hold] metoprolol succinate  50 mg Oral Daily   [MAR Hold] pantoprazole  40 mg Oral Daily   sodium chloride flush  3 mL Intravenous Q12H   Continuous Infusions:  sodium chloride     sodium chloride 1 mL/kg/hr (11/28/20 0431)   heparin Stopped (11/28/20 0742)   PRN Meds: sodium chloride, [MAR Hold] acetaminophen, fentaNYL, Heparin (Porcine) in NaCl, midazolam, [MAR Hold] nitroGLYCERIN, [MAR Hold] ondansetron (ZOFRAN) IV, sodium chloride flush   Vital Signs    Vitals:   11/27/20 1927 11/27/20 1945 11/28/20 0434 11/28/20 0830  BP:  (!) 160/83 136/77   Pulse:  (!) 56 (!) 49   Resp:  16 16   Temp:  98.7 F (37.1 C) 98.5 F (36.9 C)   TempSrc:  Oral Oral   SpO2:  97% 98% 100%  Weight: 109.8 kg 109.8 kg 109.5 kg   Height:  5' 10.5" (1.791 m)      Intake/Output Summary (Last 24 hours) at 11/28/2020 0851 Last data filed at 11/28/2020 0831 Gross per 24 hour  Intake 206.29 ml  Output --  Net 206.29 ml   Filed Weights   11/27/20 1927 11/27/20 1945 11/28/20 0434  Weight: 109.8 kg 109.8 kg 109.5 kg    Telemetry    Regular bradycardia - Personally Reviewed  ECG    SB rate 50 - Personally Reviewed  Physical Exam   GEN: No acute distress.   Neck: No JVD Cardiac: Regular bradycardia, no murmurs, rubs, or gallops. +2 R fem Respiratory: Clear to auscultation bilaterally. GI: Soft, nontender, non-distended  MS: No edema;  No deformity. Neuro:  Nonfocal  Psych: Normal affect   Labs    Chemistry Recent Labs  Lab 11/27/20 1932 11/28/20 0230  NA 138 139  K 3.7 3.2*  CL 100 104  CO2 28 28  GLUCOSE 96 97  BUN 11 9  CREATININE 0.92 0.90  CALCIUM 9.2 8.9  PROT 6.9  --   ALBUMIN 3.6  --   AST 24  --   ALT 22  --   ALKPHOS 63  --   BILITOT 2.0*  --   GFRNONAA >60 >60  ANIONGAP 10 7     Hematology Recent Labs  Lab 11/27/20 1932 11/28/20 0230  WBC 10.4 9.8  RBC 5.39 5.22  HGB 16.9 16.2  HCT 48.1 46.6  MCV 89.2 89.3  MCH 31.4 31.0  MCHC 35.1 34.8  RDW 12.3 12.3  PLT 288 274    Cardiac EnzymesNo results for input(s): TROPONINI in the last 168 hours. No results for input(s): TROPIPOC in the last 168 hours.   BNPNo results for input(s): BNP, PROBNP in the last 168 hours.   DDimer No results for input(s): DDIMER in the last 168 hours.   Radiology    No results found.   Patient Profile  65 y.o. male with prior CABG with residual chest tightness  Assessment & Plan    Chest Pain and CAD - prior CABG with LIMA to LAD, SVG to distal RCA and SVG to ramus intermedius in 2017. - continue DAPT; LHC today (planned indefinitely due to anatomy) -Continue Toprol-XL 50 mg daily with no room to uptitrate - will plan to continue HCTZ 25 mg daily, Zestril 20 mg daily, amlodipine 10 mg daily as well as Repatha - If only medical thearpy, next would be Imdur 30 mg PO daily - will need follow up with Dr. Mila Palmer  Full Code Cardiac Diet post Cath Heparin SQ after Cath Potential DC based on Cath  For questions or updates, please contact [facility] Please consult [email or web address] for contact info under Cardiology/STEMI.      Signed, Werner Lean, MD  11/28/2020, 8:51 AM

## 2020-11-28 NOTE — Telephone Encounter (Signed)
New Message:    PeTOC Patient-- Please call Patient r Leonard Edwards, please call patient on Monday(12-03-20).     TOC Patient-  Please call Patient- Patient have an appointment with Melina Copa on 1-22-22r

## 2020-11-29 NOTE — Telephone Encounter (Signed)
**Note De-Identified Audreyana Huntsberry Obfuscation** Transition Care Management Unsuccessful Follow-up Telephone Call  Date of discharge and from where:  11/28/2020 from [facility]  Attempts:  1st Attempt  Reason for unsuccessful TCM follow-up call:  Left voice message on the pts cell phone asking him to call Jeani Hawking at Dr York Cerise office at [facility] at [phone].  No answer at the pts home number and no way to leave a voice message.

## 2020-11-30 ENCOUNTER — Other Ambulatory Visit: Payer: Self-pay | Admitting: *Deleted

## 2020-11-30 MED ORDER — AMLODIPINE BESYLATE 10 MG PO TABS: 10.0000 mg | ORAL_TABLET | Freq: Every day | ORAL | 3 refills | Status: DC

## 2020-11-30 NOTE — Addendum Note (Signed)
Addended by: Patterson Hammersmith A on: 11/30/2020 07:54 AM   Modules accepted: Orders

## 2020-11-30 NOTE — Telephone Encounter (Addendum)
**Note De-Identified Leonard Edwards Obfuscation** Patient contacted regarding discharge from [place] on 11/28/2020.  Patient understands to follow up with provider Melina Copa, PA-c on 12/05/2020 at 2:20 at [address] [place] in [place], [place] [address]. Patient understands discharge instructions? Yes Patient understands medications and regiment? Yes Patient understands to bring all medications to this visit? Yes  Ask patient:  Are you enrolled in My Chart: Yes  The pt states that he is doing well at this time and denies CP/discomfort, SOB, nausea, diaphoresis, dizziness, lightheadedness, or headache. He also reports that his cath site is looks good and is without redness, streaking, swelling, drainage, or fever.  He thanked me for calling him and he verified that he does have Dr York Cerise phone number at [facility] to call if he has any questions or concerns.

## 2020-12-23 ENCOUNTER — Encounter: Payer: Self-pay | Admitting: Physician Assistant

## 2020-12-23 NOTE — Progress Notes (Signed)
Cardiology Office Note    Date:  12/25/2020   ID:  Leonard, Edwards [birth year], MRN [id number]  PCP:  Leonard Edwards., MD  Cardiologist:  Leonard Mocha, MD  Electrophysiologist:  None   Chief Complaint: f/u cath  History of Present Illness:   Leonard Edwards is a 65 y.o. male with history of CAD (multiple procedures remotely, then CABG 04/2015, overlapping DESx3 to LCx 10/2015, cath 11/2020 for med rx), HTN, HLD, prior statin intolerances (muscle aches), hiatal hernia, fibromyalgia, esophageal stricture, GERD, BPH, gout, obesity, mild dilation of ascending aorta who presents for follow-up. He was recently seen in the office with symptoms of chest tightness concerning for angina. He had been experiencing a lot of stress lately with work and a separation. He was admitted, had negative troponins, and underwent cardiac cath with findings outlined in detail below, ultimately the grafts remained patent and not significantly changed from prior. Medical therapy was recommended. EF was estimated slightly depressed but same day 2D echo EF 60-65%, grade 1 DD, mild BAE, mild dilation of ascending aorta. Imdur was added.  He returns for follow-up doing great. He has returned to swimming and denies any recurrent chest tightness, SOB, or any other symptoms. No edema, orthopnea, or syncope reported. Of note his pulse ox is noted to be 93-94% today. He states it has been this way for many years. I do see where this was previously documented in other office visits sporadically. He denies any hx of tobacco or lung disease. No new pulmonary symptoms.   Labwork independently reviewed: 11/2020 CBC wnl, LDL 47, trig 113, K 3.2, Cr 0.90, troponins negative, LFTs ok   Past Medical History:  Diagnosis Date   Ascending aorta dilation (HCC)    Benign prostatic hypertrophy    CAD (coronary artery disease)    a. multiple PCIs. b. CABG 04/2015. c. PCI to Cx 10/2015.   Esophageal stricture    Exertional angina  (HCC) 10/31/2015   Fibromyalgia    Gastric polyp    GERD (gastroesophageal reflux disease)    Hiatal hernia    High cholesterol    History of gout    Hyperlipidemia    Hypertension    Obesity    Pneumonia    hx of    Past Surgical History:  Procedure Laterality Date   CARDIAC CATHETERIZATION N/A 03/29/2015   Procedure: Left Heart Cath and Coronary Angiography;  Surgeon: Leonard Mocha, MD;  Location: [facility];  Service: Cardiovascular;  Laterality: N/A;   CARDIAC CATHETERIZATION N/A 10/31/2015   Procedure: Left Heart Cath and Cors/Grafts Angiography;  Surgeon: Leonard Mocha, MD;  Location: [facility];  Service: Cardiovascular;  Laterality: N/A;   CARDIAC CATHETERIZATION N/A 10/31/2015   Procedure: Coronary Stent Intervention;  Surgeon: Leonard Mocha, MD;  Location: [facility];  Service: Cardiovascular;  Laterality: N/A;   CHOLECYSTECTOMY N/A 07/07/2017   Procedure: LAPAROSCOPIC CHOLECYSTECTOMY WITH INTRAOPERATIVE CHOLANGIOGRAM;  Surgeon: Leonard Seltzer, MD;  Location: [facility];  Service: General;  Laterality: N/A;   COLONOSCOPY W/ POLYPECTOMY     CORONARY ANGIOPLASTY WITH STENT PLACEMENT  09/2004-10/2009   DES to distal RCA in 09/2004; DES to Ramus Intermediate 10/2009 following abnormal stress test   CORONARY ARTERY BYPASS GRAFT N/A 04/06/2015   Procedure: CORONARY ARTERY BYPASS GRAFTING (CABG) x three,  using bilateral mammaries, and right leg greater saphenous vein harvested endoscopically;  Surgeon: Leonard Pollack, MD;  Location: [facility];  Service: Open Heart Surgery;  Laterality: N/A;   ESOPHAGOGASTRODUODENOSCOPY (EGD) WITH ESOPHAGEAL DILATION     KNEE SURGERY  04/2020   LEFT HEART CATH AND CORS/GRAFTS ANGIOGRAPHY N/A 11/28/2020   Procedure: LEFT HEART CATH AND CORS/GRAFTS ANGIOGRAPHY;  Surgeon: Leonard Sine, MD;  Location: [facility];  Service: Cardiovascular;  Laterality: N/A;   SHOULDER ARTHROSCOPY Right 2015   scar tissue   TEE WITHOUT  CARDIOVERSION N/A 04/06/2015   Procedure: TRANSESOPHAGEAL ECHOCARDIOGRAM (TEE);  Surgeon: Leonard Pollack, MD;  Location: [facility];  Service: Open Heart Surgery;  Laterality: N/A;   ULTRASOUND GUIDANCE FOR VASCULAR ACCESS  03/29/2015   Procedure: Ultrasound Guidance For Vascular Access;  Surgeon: Leonard Mocha, MD;  Location: [facility];  Service: Cardiovascular;;   UMBILICAL HERNIA REPAIR N/A 07/07/2017   Procedure: HERNIA REPAIR UMBILICAL ADULT;  Surgeon: Leonard Seltzer, MD;  Location: [facility];  Service: General;  Laterality: N/A;    Current Medications: Current Meds  Medication Sig   allopurinol (ZYLOPRIM) 300 MG tablet Take 300 mg by mouth daily.   amLODipine (NORVASC) 10 MG tablet Take 1 tablet (10 mg total) by mouth daily.   aspirin EC 81 MG tablet Take 81 mg by mouth daily.   clopidogrel (PLAVIX) 75 MG tablet Take 75 mg by mouth daily.   Coenzyme Q10 (CO Q 10) 100 MG CAPS Take 100 mg by mouth daily.   colchicine 0.6 MG tablet Take 0.6 mg by mouth daily as needed (gout flare up).   Evolocumab (REPATHA SURECLICK) 630 MG/ML SOAJ INJECT 1 PEN UNDER THE SKIN EVERY 14 DAYS (Patient taking differently: Inject 140 mg into the skin every 14 (fourteen) days.)   fluticasone (FLONASE) 50 MCG/ACT nasal spray Place 2 sprays into both nostrils daily as needed for allergies or rhinitis.   hydrochlorothiazide (HYDRODIURIL) 25 MG tablet Take 25 mg by mouth daily.   indomethacin (INDOCIN) 50 MG capsule Take 50 mg by mouth as needed.   isosorbide mononitrate (IMDUR) 30 MG 24 hr tablet Take 1 tablet (30 mg total) by mouth daily.   levocetirizine (XYZAL) 5 MG tablet Take 5 mg by mouth daily as needed for allergies.   lisinopril (ZESTRIL) 20 MG tablet Take 20 mg by mouth daily.   metoprolol succinate (TOPROL-XL) 50 MG 24 hr tablet Take 1 tablet (50 mg total) by mouth daily. Take with or immediately following a meal.   montelukast (SINGULAIR) 10 MG tablet Take 10 mg by mouth daily.   nitroGLYCERIN  (NITROSTAT) 0.4 MG SL tablet Place 1 tablet (0.4 mg total) under the tongue every 5 (five) minutes as needed for chest pain.   pantoprazole (PROTONIX) 40 MG tablet Take 40 mg by mouth as needed.   [DISCONTINUED] clopidogrel (PLAVIX) 75 MG tablet TAKE 1 TABLET(75 MG) BY MOUTH DAILY WITH BREAKFAST (Patient taking differently: Take 75 mg by mouth daily.)   [DISCONTINUED] hydrochlorothiazide (HYDRODIURIL) 25 MG tablet TAKE 1 TABLET(25 MG) BY MOUTH DAILY (Patient taking differently: Take 25 mg by mouth daily.)   [DISCONTINUED] lisinopril (ZESTRIL) 20 MG tablet TAKE 1 TABLET(20 MG) BY MOUTH DAILY (Patient taking differently: Take 20 mg by mouth daily.)   [DISCONTINUED] pantoprazole (PROTONIX) 40 MG tablet TAKE 1 TABLET(40 MG) BY MOUTH DAILY (Patient taking differently: Take 40 mg by mouth daily as needed (heartburn).)     Allergies:   Patient has no known allergies.   Social History   Socioeconomic History   Marital status: Married    Spouse name: Not on file   Number of children:  4   Years of education: College   Highest education level: Not on file  Occupational History    Employer: [employer]  Tobacco Use   Smoking status: Never   Smokeless tobacco: Never  Vaping Use   Vaping Use: Never used  Substance and Sexual Activity   Alcohol use: Yes    Alcohol/week: 5.0 standard drinks    Types: 5 Glasses of wine per week   Drug use: No   Sexual activity: Not Currently  Other Topics Concern   Not on file  Social History Narrative   Not on file   Social Determinants of Health   Financial Resource Strain: Not on file  Food Insecurity: Not on file  Transportation Needs: Not on file  Physical Activity: Not on file  Stress: Not on file  Social Connections: Not on file     Family History:  The patient's family history includes Bladder Cancer in his mother; COPD in his mother; Diabetes type II in his sister; Heart attack in his father; Hypertension in his father; Lupus in his sister.  There is no history of Colon cancer, Pancreatic cancer, or Stomach cancer.  ROS:   Please see the history of present illness.  All other systems are reviewed and otherwise negative.    EKGs/Labs/Other Studies Reviewed:    Studies reviewed are outlined and summarized above. Reports included below if pertinent.  2d echo 11/2020   1. Left ventricular ejection fraction, by estimation, is 60 to 65%. The  left ventricle has normal function. The left ventricle has no regional  wall motion abnormalities. Left ventricular diastolic parameters are  consistent with Grade I diastolic  dysfunction (impaired relaxation).   2. Right ventricular systolic function is normal. The right ventricular  size is normal.   3. Left atrial size was mildly dilated.   4. Right atrial size was mildly dilated.   5. The mitral valve is normal in structure. No evidence of mitral valve  regurgitation. No evidence of mitral stenosis.   6. The aortic valve is tricuspid. There is mild calcification of the  aortic valve. Aortic valve regurgitation is not visualized. No aortic  stenosis is present.   7. Aortic dilatation noted. There is mild dilatation of the ascending  aorta, measuring 42 mm.   8. The inferior vena cava is normal in size with greater than 50%  respiratory variability, suggesting right atrial pressure of 3 mmHg.   Cath 11/28/20   Mid LM to Dist LM lesion is 95% stenosed.   Dist LM to Ost LAD lesion is 95% stenosed.   Ost Cx to Prox Cx lesion is 65% stenosed.   Ost LAD to Prox LAD lesion is 75% stenosed.   Prox RCA-1 lesion is 90% stenosed.   Mid RCA lesion is 85% stenosed.   Mid RCA to Dist RCA lesion is 100% stenosed.   RPAV lesion is 100% stenosed.   Origin lesion is 30% stenosed.   Ramus lesion is 10% stenosed.   Prox RCA-2 lesion is 20% stenosed.   Previously placed Prox Cx to Dist Cx stent (unknown type) is  widely patent.   There is mild left ventricular systolic dysfunction.   The left  ventricular ejection fraction is 45-50% by visual estimate.   Severe multivessel native CAD with 90-95% eccentric distal left main stenosis, 95% ostial LAD prior stenting of the proximal LAD with narrowing of 70 to 80% and evidence for competitive filling of the LAD via the LIMA graft;  proximal  ramus stent with competitive filling via the free RIMA graft to the ramus; diffuse 65 to 70% ostial proximal circumflex stenosis with widely patent tandem stents from the proximal to distal circumflex vessel with collateralization to the PLA/distal RCA; and diffuse native RCA disease with 90% proximal 80% mid with total occlusion proximal to the acute margin.   Patent LIMA graft supplying the LAD.   Patent free RIMA graft supplying the ramus intermediate vessel.  There is also filling of the native circumflex retrograde from the ramus.   Patent SVG supplying the PDA.  There is 30% smooth proximal stenosis.  There is some sluggish flow in the midportion of the graft with out significant stenosis.   Mild LV dysfunction with EF estimated 45 to 50%.  LVEDP 19 mmHg.   RECOMMENDATIONS: Continue DAPT long-term.  The grafts remain patent and not significantly changed from the prior catheterization.  Continue aggressive medical therapy and will add nitrates to the patient's beta-blocker, amlodipine, and if recurrent symptomatology develops may be a candidate for ranolazine.  Continue aggressive lipid-lowering therapy with PCSK9 inhibition in addition to statin therapy.    EKG:  EKG is not ordered today  Recent Labs: 11/27/2020: ALT 22 11/28/2020: BUN 9; Creatinine, Ser 0.90; Hemoglobin 16.2; Platelets 274; Potassium 3.2; Sodium 139  Recent Lipid Panel    Component Value Date/Time   CHOL 108 11/28/2020 0230   CHOL 108 11/16/2019 0901   TRIG 113 11/28/2020 0230   TRIG 147 02/10/2006 0856   HDL 38 (L) 11/28/2020 0230   HDL 40 11/16/2019 0901   CHOLHDL 2.8 11/28/2020 0230   VLDL 23 11/28/2020 0230    LDLCALC 47 11/28/2020 0230   LDLCALC 44 11/16/2019 0901   LDLDIRECT 68.2 10/25/2010 1015    PHYSICAL EXAM:    VS:  BP 130/90   Pulse 60   Ht 5\' 10"  (1.778 m)   Wt 249 lb 6.4 oz (113.1 kg)   SpO2 94%   BMI 35.79 kg/m   BMI: Body mass index is 35.79 kg/m.  GEN: Well nourished, well developed male in no acute distress HEENT: normocephalic, atraumatic Neck: no JVD, carotid bruits, or masses Cardiac: RRR; no murmurs, rubs, or gallops, no edema  Respiratory:  clear to auscultation bilaterally, normal work of breathing GI: soft, nontender, nondistended, + BS MS: no deformity or atrophy Skin: warm and dry, no rash. Right groin cath site without hematoma, ecchymosis, or bruit. Neuro:  Alert and Oriented x 3, Strength and sensation are intact, follows commands Psych: euthymic mood, full affect  Wt Readings from Last 3 Encounters:  12/25/20 249 lb 6.4 oz (113.1 kg)  11/28/20 241 lb 6.5 oz (109.5 kg)  11/27/20 242 lb 3.2 oz (109.9 kg)     ASSESSMENT & PLAN:   1. CAD - tolerating addition of Imdur well without any adverse effects. Continue ASA, Plavix, Imdur, amlodipine, Toprol. Of note, his pulse ox is noted to be on the low normal side at 93-94%. He does not desat with exertion. He denies hx of tobacco use or prior lung disease. He reports this has been this way intermittently for years. I offered referral to pulmonology but he prefers to follow up with his primary care to discuss. Echo did not show any abnormalities of the right ventricle. There was grade 1 diastolic dysfunction but he appears euvolemic on exam. We are pursuing CTA as below - will be able to evaluate lung fields this way as well.   2. Essential HTN, last labs with  hypokalemia - Suboptimal blood pressure control noted today. We need a better idea of what it's running at home. The patient was provided instructions on monitoring blood pressure at home for 1 week and relaying results to our office. Check BMET/Mg today given  hypokalemia. Consideration could be given to titrating lisinopril if needed.  3. Hyperlipidemia goal LDL <70 - last labs reviewed. LDL at goal on Repatha at 47; would continue.   4. Mild dilation of ascending aorta - per Dr. Theodosia Blender recommendation, pursue gated CTA of aorta to assess dimension. Continue to manage blood pressure as above.    Disposition: F/u with Dr. Burt Knack in 6 months.   Medication Adjustments/Labs and Tests Ordered: Current medicines are reviewed at length with the patient today.  Concerns regarding medicines are outlined above. Medication changes, Labs and Tests ordered today are summarized above and listed in the Patient Instructions accessible in Encounters.   Signed, Charlie Pitter, PA-C  12/25/2020 2:52 PM    [facility] [address], [place], [place]  [id number] Phone: [phone]; Fax: [phone]

## 2020-12-25 ENCOUNTER — Encounter: Payer: Self-pay | Admitting: Physician Assistant

## 2020-12-25 ENCOUNTER — Ambulatory Visit (INDEPENDENT_AMBULATORY_CARE_PROVIDER_SITE_OTHER): Payer: Commercial Managed Care - PPO | Admitting: Physician Assistant

## 2020-12-25 ENCOUNTER — Other Ambulatory Visit: Payer: Self-pay

## 2020-12-25 VITALS — BP 130/90 | HR 60 | Ht 70.0 in | Wt 249.4 lb

## 2020-12-25 DIAGNOSIS — E785 Hyperlipidemia, unspecified: Secondary | ICD-10-CM | POA: Diagnosis not present

## 2020-12-25 DIAGNOSIS — E876 Hypokalemia: Secondary | ICD-10-CM

## 2020-12-25 DIAGNOSIS — I77810 Thoracic aortic ectasia: Secondary | ICD-10-CM

## 2020-12-25 DIAGNOSIS — I25118 Atherosclerotic heart disease of native coronary artery with other forms of angina pectoris: Secondary | ICD-10-CM

## 2020-12-25 DIAGNOSIS — I10 Essential (primary) hypertension: Secondary | ICD-10-CM | POA: Diagnosis not present

## 2020-12-25 NOTE — Patient Instructions (Addendum)
Medication Instructions:  Your physician recommends that you continue on your current medications as directed. Please refer to the Current Medication list given to you today.  *If you need a refill on your cardiac medications before your next appointment, please call your pharmacy*   Lab Work: TODAY:  BMET &MAG  If you have labs (blood work) drawn today and your tests are completely normal, you will receive your results only by: [place] (if you have MyChart) OR A paper copy in the mail If you have any lab test that is abnormal or we need to change your treatment, we will call you to review the results.   Testing/Procedures: Non-Cardiac CT Angiography (CTA), is a special type of CT scan that uses a computer to produce multi-dimensional views of major blood vessels throughout the body. In CT angiography, a contrast material is injected through an IV to help visualize the blood vessels    Follow-Up: At [facility], you and your health needs are our priority.  As part of our continuing mission to provide you with exceptional heart care, we have created designated Provider Care Teams.  These Care Teams include your primary Cardiologist (physician) and Advanced Practice Providers (APPs -  Physician Assistants and Nurse Practitioners) who all work together to provide you with the care you need, when you need it.  We recommend signing up for the patient portal called "MyChart".  Sign up information is provided on this After Visit Summary.  MyChart is used to connect with patients for Virtual Visits (Telemedicine).  Patients are able to view lab/test results, encounter notes, upcoming appointments, etc.  Non-urgent messages can be sent to your provider as well.   To learn more about what you can do with MyChart, go to [email or web address].    Your next appointment:   6 month(s)  The format for your next appointment:   In Person  Provider:   Sherren Mocha, MD  OR Melina Copa,  PA-C   Other Instructions  We need to get a better idea of what your blood pressure is running at home. Here are some instructions to follow: - I would recommend using a blood pressure cuff that goes on your arm. The wrist ones can be inaccurate. If you're purchasing one for the first time, try to select one that also reports your heart rate because this can be helpful information as well. - To check your blood pressure, choose a time at least 3 hours after taking your blood pressure medicines. If you can sample it at different times of the day, that's great - it might give you more information about how your blood pressure fluctuates. Remain seated in a chair for 5 minutes quietly beforehand, then check it.  - Please record a list of those readings and call us/send in MyChart message with them for our review in 1 week.

## 2020-12-26 LAB — BASIC METABOLIC PANEL
BUN/Creatinine Ratio: 19 (ref 10–24)
BUN: 17 mg/dL (ref 8–27)
CO2: 29 mmol/L (ref 20–29)
Calcium: 9.7 mg/dL (ref 8.6–10.2)
Chloride: 102 mmol/L (ref 96–106)
Creatinine, Ser: 0.91 mg/dL (ref 0.76–1.27)
Glucose: 102 mg/dL — ABNORMAL HIGH (ref 70–99)
Potassium: 4.1 mmol/L (ref 3.5–5.2)
Sodium: 144 mmol/L (ref 134–144)
eGFR: 94 mL/min/{1.73_m2} (ref >59)

## 2020-12-26 LAB — MAGNESIUM: Magnesium: 1.8 mg/dL (ref 1.6–2.3)

## 2020-12-26 NOTE — Progress Notes (Signed)
Pt has been made aware of normal result and verbalized understanding.  jw

## 2021-01-08 ENCOUNTER — Ambulatory Visit (HOSPITAL_COMMUNITY): Payer: Commercial Managed Care - PPO

## 2021-01-08 ENCOUNTER — Ambulatory Visit (HOSPITAL_COMMUNITY)
Admission: RE | Admit: 2021-01-08 | Discharge: 2021-01-08 | Disposition: A | Discharge status: Home / Self Care | Payer: Commercial Managed Care - PPO | Source: Ambulatory Visit | Attending: Physician Assistant | Admitting: Physician Assistant

## 2021-01-08 ENCOUNTER — Encounter (HOSPITAL_COMMUNITY): Payer: Self-pay

## 2021-01-08 DIAGNOSIS — I77810 Thoracic aortic ectasia: Secondary | ICD-10-CM

## 2021-01-08 DIAGNOSIS — I25118 Atherosclerotic heart disease of native coronary artery with other forms of angina pectoris: Secondary | ICD-10-CM

## 2021-01-08 DIAGNOSIS — E785 Hyperlipidemia, unspecified: Secondary | ICD-10-CM

## 2021-01-08 DIAGNOSIS — E876 Hypokalemia: Secondary | ICD-10-CM

## 2021-01-08 DIAGNOSIS — I10 Essential (primary) hypertension: Secondary | ICD-10-CM

## 2021-01-18 ENCOUNTER — Other Ambulatory Visit: Payer: Self-pay

## 2021-01-18 MED ORDER — HYDROCHLOROTHIAZIDE 25 MG PO TABS: 25.0000 mg | ORAL_TABLET | Freq: Every day | ORAL | 3 refills | Status: DC

## 2021-01-18 MED ORDER — CLOPIDOGREL BISULFATE 75 MG PO TABS: 75.0000 mg | ORAL_TABLET | Freq: Every day | ORAL | 3 refills | Status: DC

## 2021-01-18 MED ORDER — ISOSORBIDE MONONITRATE ER 30 MG PO TB24
30.0000 mg | ORAL_TABLET | Freq: Every day | ORAL | 11 refills | Status: DC
Start: 2021-01-18 — End: 2022-01-30

## 2021-01-18 MED ORDER — LISINOPRIL 20 MG PO TABS: 20.0000 mg | ORAL_TABLET | Freq: Every day | ORAL | 3 refills | Status: DC

## 2021-01-18 MED ORDER — NITROGLYCERIN 0.4 MG SL SUBL: 0.4000 mg | SUBLINGUAL_TABLET | SUBLINGUAL | 11 refills | Status: AC | PRN

## 2021-01-18 MED ORDER — PANTOPRAZOLE SODIUM 40 MG PO TBEC: 40.0000 mg | DELAYED_RELEASE_TABLET | ORAL | 3 refills | Status: DC | PRN

## 2021-01-18 NOTE — Telephone Encounter (Signed)
Pt's medications were sent to pt's pharmacy as requested. Confirmation received.  

## 2021-02-08 ENCOUNTER — Other Ambulatory Visit: Payer: Self-pay | Admitting: Internal Medicine

## 2021-02-12 ENCOUNTER — Ambulatory Visit (HOSPITAL_COMMUNITY): Admission: RE | Admit: 2021-02-12 | Payer: Commercial Managed Care - PPO | Source: Ambulatory Visit

## 2021-02-25 ENCOUNTER — Other Ambulatory Visit: Payer: Self-pay

## 2021-02-25 ENCOUNTER — Telehealth: Payer: Self-pay

## 2021-02-25 MED ORDER — AMOXICILLIN-POT CLAVULANATE 875-125 MG PO TABS: 1.0000 | ORAL_TABLET | Freq: Two times a day (BID) | ORAL | 1 refills | Status: DC

## 2021-02-25 MED ORDER — AMOXICILLIN 500 MG PO CAPS: 500.0000 mg | ORAL_CAPSULE | Freq: Two times a day (BID) | ORAL | 1 refills | Status: DC

## 2021-02-25 NOTE — Telephone Encounter (Signed)
Script sent to pharmacy.

## 2021-02-25 NOTE — Telephone Encounter (Signed)
-----   Message from Irene Shipper, MD sent at 02/25/2021 12:21 PM EST ----- Regarding: Amoxicillin prescription Leonard Edwards, Mr. Clabo contacted me.  He is suffering from acute sinusitis.  Please refill his amoxicillin prescription as previous.  1 refill. Sent to [facility] on [address]. Thanks JP

## 2021-03-12 ENCOUNTER — Other Ambulatory Visit: Payer: Self-pay | Admitting: Cardiovascular Disease

## 2021-03-12 DIAGNOSIS — Z01810 Encounter for preprocedural cardiovascular examination: Secondary | ICD-10-CM

## 2021-03-12 NOTE — Progress Notes (Signed)
Order placed for BMET so pt can get his CTA. Had to re-schedule and now labs are needed.

## 2021-03-14 ENCOUNTER — Other Ambulatory Visit: Payer: Self-pay

## 2021-03-14 ENCOUNTER — Other Ambulatory Visit: Payer: Commercial Managed Care - PPO

## 2021-03-14 DIAGNOSIS — Z01810 Encounter for preprocedural cardiovascular examination: Secondary | ICD-10-CM

## 2021-03-14 LAB — BASIC METABOLIC PANEL
BUN/Creatinine Ratio: 16 (ref 10–24)
BUN: 13 mg/dL (ref 8–27)
CO2: 25 mmol/L (ref 20–29)
Calcium: 9.7 mg/dL (ref 8.6–10.2)
Chloride: 101 mmol/L (ref 96–106)
Creatinine, Ser: 0.83 mg/dL (ref 0.76–1.27)
Glucose: 104 mg/dL — ABNORMAL HIGH (ref 70–99)
Potassium: 4 mmol/L (ref 3.5–5.2)
Sodium: 141 mmol/L (ref 134–144)
eGFR: 97 mL/min/{1.73_m2} (ref >59)

## 2021-03-20 ENCOUNTER — Ambulatory Visit (HOSPITAL_COMMUNITY): Payer: Commercial Managed Care - PPO

## 2021-04-01 ENCOUNTER — Other Ambulatory Visit: Payer: Self-pay | Admitting: Cardiovascular Disease

## 2021-04-02 ENCOUNTER — Ambulatory Visit (HOSPITAL_COMMUNITY): Payer: Commercial Managed Care - PPO

## 2021-04-09 ENCOUNTER — Encounter (HOSPITAL_COMMUNITY): Payer: Self-pay

## 2021-04-09 ENCOUNTER — Ambulatory Visit (HOSPITAL_COMMUNITY)
Admission: RE | Admit: 2021-04-09 | Discharge: 2021-04-09 | Disposition: A | Discharge status: Home / Self Care | Payer: Commercial Managed Care - PPO | Source: Ambulatory Visit | Attending: Physician Assistant | Admitting: Physician Assistant

## 2021-04-09 ENCOUNTER — Telehealth: Payer: Self-pay | Admitting: *Deleted

## 2021-04-09 ENCOUNTER — Other Ambulatory Visit: Payer: Self-pay

## 2021-04-09 DIAGNOSIS — E785 Hyperlipidemia, unspecified: Secondary | ICD-10-CM | POA: Insufficient documentation

## 2021-04-09 DIAGNOSIS — E876 Hypokalemia: Secondary | ICD-10-CM | POA: Diagnosis present

## 2021-04-09 DIAGNOSIS — I25118 Atherosclerotic heart disease of native coronary artery with other forms of angina pectoris: Secondary | ICD-10-CM | POA: Insufficient documentation

## 2021-04-09 DIAGNOSIS — I77810 Thoracic aortic ectasia: Secondary | ICD-10-CM | POA: Diagnosis present

## 2021-04-09 DIAGNOSIS — I10 Essential (primary) hypertension: Secondary | ICD-10-CM | POA: Insufficient documentation

## 2021-04-09 MED ORDER — IOHEXOL 350 MG/ML SOLN
80.0000 mL | Freq: Once | INTRAVENOUS | Status: AC | PRN
  Administered 2021-04-09: 80 mL via INTRAVENOUS

## 2021-04-09 NOTE — Telephone Encounter (Signed)
-----   Message from April Garrison, [place] sent at 04/09/2021  4:13 PM EST ----- ? ?----- Message ----- ?From: Imogene Burn, PA-C ?Sent: 04/09/2021   3:07 PM EST ?To: April Garrison, [place] ? ?I'm covering for Dayna. CTA shows mild aortic aneurysm. Repeat CTA in 1 yr. Please order under Dr. Radford Pax. Thanks ? ?

## 2021-04-17 ENCOUNTER — Other Ambulatory Visit: Payer: Self-pay | Admitting: Internal Medicine

## 2021-05-28 ENCOUNTER — Telehealth: Payer: Self-pay

## 2021-05-28 NOTE — Telephone Encounter (Signed)
LVM for pt to call me back to schedule sleep study  

## 2021-06-18 ENCOUNTER — Ambulatory Visit (INDEPENDENT_AMBULATORY_CARE_PROVIDER_SITE_OTHER): Payer: Commercial Managed Care - PPO | Admitting: Neurology

## 2021-06-18 DIAGNOSIS — G4720 Circadian rhythm sleep disorder, unspecified type: Secondary | ICD-10-CM

## 2021-06-18 DIAGNOSIS — E669 Obesity, unspecified: Secondary | ICD-10-CM

## 2021-06-18 DIAGNOSIS — D751 Secondary polycythemia: Secondary | ICD-10-CM

## 2021-06-18 DIAGNOSIS — G4733 Obstructive sleep apnea (adult) (pediatric): Secondary | ICD-10-CM

## 2021-06-18 DIAGNOSIS — R0683 Snoring: Secondary | ICD-10-CM

## 2021-06-18 DIAGNOSIS — G479 Sleep disorder, unspecified: Secondary | ICD-10-CM

## 2021-06-18 DIAGNOSIS — R351 Nocturia: Secondary | ICD-10-CM

## 2021-06-27 ENCOUNTER — Other Ambulatory Visit: Payer: Self-pay | Admitting: Cardiovascular Disease

## 2021-06-28 NOTE — Progress Notes (Signed)
Patient referred by Dr. Brigitte Pulse, seen by me on 11/20/20, diagnostic PSG on 06/18/21.    Please call and notify the patient that the recent sleep study showed mild to moderate (during REM sleep) obstructive sleep apnea. OSA. Given is sleep related complaints and elevated hemoglobin, I recommend treatment of his OSA in the form of autoPAP, which means, that we don't have to bring him back for a second sleep study with CPAP, but will let him try an autoPAP machine at home, through a DME company (of his choice, or as per insurance requirement). The DME representative will educate him on how to use the machine, how to put the mask on, etc. I have placed an order in the chart. Please send referral, talk to patient, send report to referring MD. We will need a FU in sleep clinic for 10 weeks post-PAP set up, please arrange that with me or one of our NPs. Thanks,   Star Age, MD, PhD [facility])

## 2021-06-28 NOTE — Procedures (Signed)
PATIENT'S NAME:  Leonard Edwards, Leonard Edwards DOB:      [birth year]      MR#:    [id number]     DATE OF RECORDING: 06/18/2021 REFERRING M.D.:  Marton Redwood, MD Study Performed:   Baseline Polysomnogram HISTORY: 66 year old man with an underlying medical history of low back pain, coronary artery disease with status post stent placement, status post CABG, hypertension, hyperlipidemia, gout, allergic rhinitis, hypogonadism, polycythemia, and morbid obesity, who reports snoring and difficulty maintaining sleep, as well as nonrestorative sleep and restless sleep.  He reports that he was recently diagnosed with elevated hemoglobin values. The patient endorsed the Epworth Sleepiness Scale at 8 points. The patient's weight 248 pounds with a height of 60 (inches), resulting in a BMI of 48.5 kg/m2. The patient's neck circumference measured 18.5 inches.  CURRENT MEDICATIONS: Zyloprim, Norvasc, Amoxil, Aspirin, Plavix, CO Q 10, Colchicine, Tyleonol PM, Repatha, Flonase, Hydrodiuril,  Xyzal, Zestril, Toprol-XL, Singulair, Nitrostat, Protonix, Proair HFA, Viagra   PROCEDURE:  This is a multichannel digital polysomnogram utilizing the Somnostar 11.2 system.  Electrodes and sensors were applied and monitored per AASM Specifications.   EEG, EOG, Chin and Limb EMG, were sampled at 200 Hz.  ECG, Snore and Nasal Pressure, Thermal Airflow, Respiratory Effort, CPAP Flow and Pressure, Oximetry was sampled at 50 Hz. Digital video and audio were recorded.      BASELINE STUDY  Lights Out was at 20:49 and Lights On at 05:15.  Total recording time (TRT) was 507 minutes, with a total sleep time (TST) of 387.5 minutes.   The patient's sleep latency was 80.5 minutes, which is delayed. REM latency was 129.5 minutes, which is mildly delayed. The sleep efficiency was 76.4 %.     SLEEP ARCHITECTURE: WASO (Wake after sleep onset) was 58.5 minutes with mild to moderate sleep fragmentation noted. There were 18 minutes in Stage N1, 215 minutes Stage N2,  71.5 minutes Stage N3 and 83 minutes in Stage REM.  The percentage of Stage N1 was 4.6%, Stage N2 was 55.5%, which is normal, Stage N3 was 18.5% and Stage R (REM sleep) was 21.4%, which is normal. The arousals were noted as: 76 were spontaneous, 0 were associated with PLMs, 14 were associated with respiratory events.  RESPIRATORY ANALYSIS:  There were a total of 78 respiratory events:  0 obstructive apneas, 0 central apneas and 0 mixed apneas with a total of 0 apneas and an apnea index (AI) of 0 /hour. There were 78 hypopneas with a hypopnea index of 12.1 /hour. The patient also had 0 respiratory event related arousals (RERAs).      The total APNEA/HYPOPNEA INDEX (AHI) was 12.1/hour and the total RESPIRATORY DISTURBANCE INDEX was  12.1 /hour.  36 events occurred in REM sleep and 84 events in NREM. The REM AHI was  26. /hour, versus a non-REM AHI of 8.3. The patient spent 0 minutes of total sleep time in the supine position and 388 minutes in non-supine.. The supine AHI was n/a versus a non-supine AHI of 12.1.  OXYGEN SATURATION & C02:  The Wake baseline 02 saturation was 94%, with the lowest being 85%. Time spent below 89% saturation equaled 40 minutes.  PERIODIC LIMB MOVEMENTS: The patient had a total of 0 Periodic Limb Movements.  The Periodic Limb Movement (PLM) index was 0 and the PLM Arousal index was 0/hour.  Audio and video analysis did not show any abnormal or unusual movements, behaviors, phonations or vocalizations. The patient took 1 bathroom break. Mild to moderate snoring  was noted. The EKG was in keeping with normal sinus rhythm (NSR).  Post-study, the patient indicated that sleep was the same as usual.   IMPRESSION:  Obstructive Sleep Apnea (OSA) Dysfunctions associated with sleep stages or arousal from sleep  RECOMMENDATIONS:  This study demonstrates overall mild obstructive sleep apnea, moderate during REM sleep with a total AHI of 12.1/hour, REM AHI of 26/hour, and O2 nadir of  85%. The absence of supine sleep likely underestimates his AHI and O2 nadir. Given the patient's medical history and sleep related complaints, treatment with positive airway pressure is recommended; this can be achieved in the form of autoPAP trial/titration at home. A full-night CPAP titration study would allow optimization of therapy, if needed. Other treatment options may include avoidance of supine sleep position along with weight loss, or the use of an oral appliance (through a dentist or orthodontist) in selected patients. Please note that untreated obstructive sleep apnea may carry additional perioperative morbidity. Patients with significant obstructive sleep apnea should receive perioperative PAP therapy and the surgeons and particularly the anesthesiologist should be informed of the diagnosis and the severity of the sleep disordered breathing. This study shows sleep fragmentation and but normal sleep stage percentages; these are nonspecific findings and per se do not signify an intrinsic sleep disorder or a cause for the patient's sleep-related symptoms. Causes include (but are not limited to) the first night effect of the sleep study, circadian rhythm disturbances, medication effect or an underlying mood disorder or medical problem.  The patient should be cautioned not to drive, work at heights, or operate dangerous or heavy equipment when tired or sleepy. Review and reiteration of good sleep hygiene measures should be pursued with any patient. The patient will be seen in follow-up by Dr. Rexene Alberts at [facility] for discussion of the test results and further management strategies. The referring provider will be notified of the test results.  I certify that I have reviewed the entire raw data recording prior to the issuance of this report in accordance with the Standards of Accreditation of the American Academy of Sleep Medicine (AASM)  Star Age, MD, PhD Diplomat, American Board of Neurology and Sleep  Medicine (Neurology and Sleep Medicine)

## 2021-06-28 NOTE — Addendum Note (Signed)
Addended by: Star Age on: 06/28/2021 10:34 AM   Modules accepted: Orders

## 2021-07-02 ENCOUNTER — Telehealth: Payer: Self-pay

## 2021-07-02 NOTE — Telephone Encounter (Signed)
I called patient to discuss. No answer, VM is full. If patient calls back another day please route to POD 4.

## 2021-07-02 NOTE — Telephone Encounter (Signed)
-----   Message from Star Age, MD sent at 06/28/2021 10:34 AM EDT ----- Patient referred by Dr. Brigitte Pulse, seen by me on 11/20/20, diagnostic PSG on 06/18/21.    Please call and notify the patient that the recent sleep study showed mild to moderate (during REM sleep) obstructive sleep apnea. OSA. Given is sleep related complaints and elevated hemoglobin, I recommend treatment of his OSA in the form of autoPAP, which means, that we don't have to bring him back for a second sleep study with CPAP, but will let him try an autoPAP machine at home, through a DME company (of his choice, or as per insurance requirement). The DME representative will educate him on how to use the machine, how to put the mask on, etc. I have placed an order in the chart. Please send referral, talk to patient, send report to referring MD. We will need a FU in sleep clinic for 10 weeks post-PAP set up, please arrange that with me or one of our NPs. Thanks,   Star Age, MD, PhD [facility])

## 2021-07-09 NOTE — Telephone Encounter (Signed)
Called pt but he was on another line. He will call us back to discuss sleep study results.

## 2021-07-11 ENCOUNTER — Telehealth: Payer: Self-pay | Admitting: *Deleted

## 2021-07-11 ENCOUNTER — Encounter: Payer: Self-pay | Admitting: *Deleted

## 2021-07-11 NOTE — Telephone Encounter (Signed)
-----   Message from Star Age, MD sent at 06/28/2021 10:34 AM EDT ----- Patient referred by Dr. Brigitte Pulse, seen by me on 11/20/20, diagnostic PSG on 06/18/21.    Please call and notify the patient that the recent sleep study showed mild to moderate (during REM sleep) obstructive sleep apnea. OSA. Given is sleep related complaints and elevated hemoglobin, I recommend treatment of his OSA in the form of autoPAP, which means, that we don't have to bring him back for a second sleep study with CPAP, but will let him try an autoPAP machine at home, through a DME company (of his choice, or as per insurance requirement). The DME representative will educate him on how to use the machine, how to put the mask on, etc. I have placed an order in the chart. Please send referral, talk to patient, send report to referring MD. We will need a FU in sleep clinic for 10 weeks post-PAP set up, please arrange that with me or one of our NPs. Thanks,   Star Age, MD, PhD [facility])

## 2021-07-11 NOTE — Telephone Encounter (Signed)
I called pt. I advised pt that Dr. Rexene Alberts reviewed their sleep study results and found that pt mild to moderate OSA. Dr. Francesco Sor recommends that pt start autopap. I reviewed PAP compliance expectations with the pt. Pt is agreeable to starting an auto-PAP. I advised pt that an order will be sent to a DME, [employer], and they  will call the pt within about one week after they file with the pt's insurance. [facility] will show the pt how to use the machine, fit for masks, and troubleshoot the auto-PAP if needed. A follow up appt was made for insurance purposes with [facility] NP on 09-19-21 at 1130. Pt verbalized understanding to arrive 15 minutes early and bring their auto-PAP. A letter with all of this information in it will be mailed to the pt as a reminder. I verified with the pt that the address we have on file is correct. Pt verbalized understanding of results. Pt had no questions at this time but was encouraged to call back if questions arise. I have sent the order to [employer] and have received confirmation that they have received the order.

## 2021-07-11 NOTE — Telephone Encounter (Signed)
Denyse Amass, RN; Vanessa Ralphs got it!      Previous Messages    ----- Message -----  From: Brandon Melnick, RN  Sent: 07/11/2021   3:04 PM EDT  To: Vanessa Ralphs; Marchelle Gearing  Subject: new autopap user                               New auto pap user order for pt in Epic.     [facility]  Male, 66 y.o., 11/10/1955  MRN:  [id number]  Phone:  [phone] (M)   Thanks   Lovey Newcomer RN

## 2021-09-18 ENCOUNTER — Encounter: Payer: Self-pay | Admitting: *Deleted

## 2021-09-19 ENCOUNTER — Encounter: Payer: Self-pay | Admitting: Adult Health

## 2021-09-19 ENCOUNTER — Encounter: Payer: Commercial Managed Care - PPO | Admitting: Adult Health

## 2021-10-02 ENCOUNTER — Other Ambulatory Visit: Payer: Self-pay | Admitting: Cardiovascular Disease

## 2021-10-02 DIAGNOSIS — I25118 Atherosclerotic heart disease of native coronary artery with other forms of angina pectoris: Secondary | ICD-10-CM

## 2021-10-02 DIAGNOSIS — E785 Hyperlipidemia, unspecified: Secondary | ICD-10-CM

## 2021-10-09 NOTE — Progress Notes (Signed)
Cardiology Office Note:    Date:  10/10/2021   ID:  Leonard Edwards, DOB [birth year], MRN [id number]  PCP:  Leonard Edwards., MD  [facility] Cardiologist:  Sherren Mocha, MD  [place] Electrophysiologist:  None   Chief Complaint: 10 months follow up   History of Present Illness:    Leonard Edwards is a 66 y.o. male with a hx of CAD (multiple procedures remotely, then CABG 04/2015, overlapping DESx3 to LCx 10/2015, cath 11/2020 for med rx), HTN, HLD, prior statin intolerances (muscle aches), hiatal hernia, fibromyalgia, esophageal stricture, GERD, BPH, gout, obesity, mild dilation of ascending aorta  and OSA on CPAP seen for follow up.   Last cath 11/2020 with patent 3/3 grafts (LIMA to LAD, RIMA to RI and SVG to PDA). No significantly changed from prior cath. Medical management.   Last echo 11/2020 with LVEF of 60-65% and grade 1 DD  Patient is here for follow-up.  He is not taking his amlodipine for past 2 days he has he misplaced his medication.  Reports blood pressure runs in 140 over 90s at home.  Denies chest pain, shortness of breath, orthopnea, PND, syncope, lower extremity edema or melena.  He does swimming for about 45 to 60 minutes 4 days/week.  He walks about 4 to 5 miles few days per week.  No exertional limitation.  Past Medical History:  Diagnosis Date   Ascending aorta dilation (HCC)    Benign prostatic hypertrophy    CAD (coronary artery disease)    a. multiple PCIs. b. CABG 04/2015. c. PCI to Cx 10/2015.   Esophageal stricture    Exertional angina (HCC) 10/31/2015   Fibromyalgia    Gastric polyp    GERD (gastroesophageal reflux disease)    Hiatal hernia    High cholesterol    History of gout    Hyperlipidemia    Hypertension    Obesity    Pneumonia    hx of    Past Surgical History:  Procedure Laterality Date   CARDIAC CATHETERIZATION N/A 03/29/2015   Procedure: Left Heart Cath and Coronary Angiography;  Surgeon: Sherren Mocha, MD;  Location: [place] [facility];  Service: Cardiovascular;  Laterality: N/A;   CARDIAC CATHETERIZATION N/A 10/31/2015   Procedure: Left Heart Cath and Cors/Grafts Angiography;  Surgeon: Sherren Mocha, MD;  Location: [place] [facility];  Service: Cardiovascular;  Laterality: N/A;   CARDIAC CATHETERIZATION N/A 10/31/2015   Procedure: Coronary Stent Intervention;  Surgeon: Sherren Mocha, MD;  Location: [place] [facility];  Service: Cardiovascular;  Laterality: N/A;   CHOLECYSTECTOMY N/A 07/07/2017   Procedure: LAPAROSCOPIC CHOLECYSTECTOMY WITH INTRAOPERATIVE CHOLANGIOGRAM;  Surgeon: Excell Seltzer, MD;  Location: [facility];  Service: General;  Laterality: N/A;   COLONOSCOPY W/ POLYPECTOMY     CORONARY ANGIOPLASTY WITH STENT PLACEMENT  09/2004-10/2009   DES to distal RCA in 09/2004; DES to Ramus Intermediate 10/2009 following abnormal stress test   CORONARY ARTERY BYPASS GRAFT N/A 04/06/2015   Procedure: CORONARY ARTERY BYPASS GRAFTING (CABG) x three,  using bilateral mammaries, and right leg greater saphenous vein harvested endoscopically;  Surgeon: Gaye Pollack, MD;  Location: [place];  Service: Open Heart Surgery;  Laterality: N/A;   ESOPHAGOGASTRODUODENOSCOPY (EGD) WITH ESOPHAGEAL DILATION     KNEE SURGERY  04/2020   LEFT HEART CATH AND CORS/GRAFTS ANGIOGRAPHY N/A 11/28/2020   Procedure: LEFT HEART CATH AND CORS/GRAFTS ANGIOGRAPHY;  Surgeon: Troy Sine, MD;  Location: [place] [facility];  Service: Cardiovascular;  Laterality: N/A;   SHOULDER ARTHROSCOPY Right 2015   scar tissue   TEE WITHOUT CARDIOVERSION N/A 04/06/2015   Procedure: TRANSESOPHAGEAL ECHOCARDIOGRAM (TEE);  Surgeon: Gaye Pollack, MD;  Location: [place];  Service: Open Heart Surgery;  Laterality: N/A;   ULTRASOUND GUIDANCE FOR VASCULAR ACCESS  03/29/2015   Procedure: Ultrasound Guidance For Vascular Access;  Surgeon: Sherren Mocha, MD;  Location: [place] [facility];  Service: Cardiovascular;;   UMBILICAL HERNIA REPAIR N/A 07/07/2017    Procedure: HERNIA REPAIR UMBILICAL ADULT;  Surgeon: Excell Seltzer, MD;  Location: [facility];  Service: General;  Laterality: N/A;    Current Medications: Current Meds  Medication Sig   allopurinol (ZYLOPRIM) 300 MG tablet Take 300 mg by mouth daily.   amLODipine (NORVASC) 10 MG tablet Take 1 tablet (10 mg total) by mouth daily.   amoxicillin (AMOXIL) 500 MG capsule Take 1 capsule (500 mg total) by mouth 2 (two) times daily.   aspirin EC 81 MG tablet Take 81 mg by mouth daily.   clopidogrel (PLAVIX) 75 MG tablet TAKE 1 TABLET(75 MG) BY MOUTH DAILY WITH BREAKFAST   Coenzyme Q10 (CO Q 10) 100 MG CAPS Take 100 mg by mouth daily.   colchicine 0.6 MG tablet Take 0.6 mg by mouth daily as needed (gout flare up).   Evolocumab (REPATHA SURECLICK) 629 MG/ML SOAJ Inject 140 mg into the skin every 14 (fourteen) days.   fluticasone (FLONASE) 50 MCG/ACT nasal spray Place 2 sprays into both nostrils daily as needed for allergies or rhinitis.   hydrochlorothiazide (HYDRODIURIL) 25 MG tablet TAKE 1 TABLET(25 MG) BY MOUTH DAILY   indomethacin (INDOCIN) 50 MG capsule TAKE 1 CAPSULE(50 MG) BY MOUTH THREE TIMES DAILY WITH MEALS   isosorbide mononitrate (IMDUR) 30 MG 24 hr tablet Take 1 tablet (30 mg total) by mouth daily.   levocetirizine (XYZAL) 5 MG tablet Take 5 mg by mouth daily as needed for allergies.   lisinopril (ZESTRIL) 20 MG tablet TAKE 1 TABLET(20 MG) BY MOUTH DAILY   metoprolol succinate (TOPROL-XL) 50 MG 24 hr tablet TAKE 1 TABLET(50 MG) BY MOUTH DAILY WITH OR IMMEDIATELY FOLLOWING A MEAL   montelukast (SINGULAIR) 10 MG tablet Take 10 mg by mouth daily.   pantoprazole (PROTONIX) 40 MG tablet TAKE 1 TABLET(40 MG) BY MOUTH DAILY     Allergies:   Patient has no known allergies.   Social History   Socioeconomic History   Marital status: Married    Spouse name: Not on file   Number of children: 4   Years of education: College   Highest education level: Not on file  Occupational History     Employer: [employer]  Tobacco Use   Smoking status: Never   Smokeless tobacco: Never  Vaping Use   Vaping Use: Never used  Substance and Sexual Activity   Alcohol use: Yes    Alcohol/week: 5.0 standard drinks of alcohol    Types: 5 Glasses of wine per week   Drug use: No   Sexual activity: Not Currently  Other Topics Concern   Not on file  Social History Narrative   Not on file   Social Determinants of Health   Financial Resource Strain: Not on file  Food Insecurity: Not on file  Transportation Needs: Not on file  Physical Activity: Not on file  Stress: Not on file  Social Connections: Not on file     Family History: The patient's family history includes Bladder Cancer in his mother; COPD in his  mother; Diabetes type II in his sister; Heart attack in his father; Hypertension in his father; Lupus in his sister. There is no history of Colon cancer, Pancreatic cancer, or Stomach cancer.   ROS:   Please see the history of present illness.    All other systems reviewed and are negative.  EKGs/Labs/Other Studies Reviewed:    The following studies were reviewed today:  Echo 11/2020  1. Left ventricular ejection fraction, by estimation, is 60 to 65%. The  left ventricle has normal function. The left ventricle has no regional  wall motion abnormalities. Left ventricular diastolic parameters are  consistent with Grade I diastolic  dysfunction (impaired relaxation).   2. Right ventricular systolic function is normal. The right ventricular  size is normal.   3. Left atrial size was mildly dilated.   4. Right atrial size was mildly dilated.   5. The mitral valve is normal in structure. No evidence of mitral valve  regurgitation. No evidence of mitral stenosis.   6. The aortic valve is tricuspid. There is mild calcification of the  aortic valve. Aortic valve regurgitation is not visualized. No aortic  stenosis is present.   7. Aortic dilatation noted. There is mild  dilatation of the ascending  aorta, measuring 42 mm.   8. The inferior vena cava is normal in size with greater than 50%  respiratory variability, suggesting right atrial pressure of 3 mmHg.  LEFT HEART CATH AND CORS/GRAFTS ANGIOGRAPHY 11/2020    Conclusion       Mid LM to Dist LM lesion is 95% stenosed.   Dist LM to Ost LAD lesion is 95% stenosed.   Ost Cx to Prox Cx lesion is 65% stenosed.   Ost LAD to Prox LAD lesion is 75% stenosed.   Prox RCA-1 lesion is 90% stenosed.   Mid RCA lesion is 85% stenosed.   Mid RCA to Dist RCA lesion is 100% stenosed.   RPAV lesion is 100% stenosed.   Origin lesion is 30% stenosed.   Ramus lesion is 10% stenosed.   Prox RCA-2 lesion is 20% stenosed.   Previously placed Prox Cx to Dist Cx stent (unknown type) is  widely patent.   There is mild left ventricular systolic dysfunction.   The left ventricular ejection fraction is 45-50% by visual estimate.   Severe multivessel native CAD with 90-95% eccentric distal left main stenosis, 95% ostial LAD prior stenting of the proximal LAD with narrowing of 70 to 80% and evidence for competitive filling of the LAD via the LIMA graft;  proximal ramus stent with competitive filling via the free RIMA graft to the ramus; diffuse 65 to 70% ostial proximal circumflex stenosis with widely patent tandem stents from the proximal to distal circumflex vessel with collateralization to the PLA/distal RCA; and diffuse native RCA disease with 90% proximal 80% mid with total occlusion proximal to the acute margin.   Patent LIMA graft supplying the LAD.   Patent free RIMA graft supplying the ramus intermediate vessel.  There is also filling of the native circumflex retrograde from the ramus.   Patent SVG supplying the PDA.  There is 30% smooth proximal stenosis.  There is some sluggish flow in the midportion of the graft with out significant stenosis.   Mild LV dysfunction with EF estimated 45 to 50%.  LVEDP 19 mmHg.    RECOMMENDATIONS: Continue DAPT long-term.  The grafts remain patent and not significantly changed from the prior catheterization.  Continue aggressive medical therapy and will add  nitrates to the patient's beta-blocker, amlodipine, and if recurrent symptomatology develops may be a candidate for ranolazine.  Continue aggressive lipid-lowering therapy with PCSK9 inhibition in addition to statin therapy.    Diagnostic Dominance: Right     EKG:  EKG is not ordered today.    Recent Labs: 11/27/2020: ALT 22 11/28/2020: Hemoglobin 16.2; Platelets 274 12/25/2020: Magnesium 1.8 03/14/2021: BUN 13; Creatinine, Ser 0.83; Potassium 4.0; Sodium 141  Recent Lipid Panel    Component Value Date/Time   CHOL 108 11/28/2020 0230   CHOL 108 11/16/2019 0901   TRIG 113 11/28/2020 0230   TRIG 147 02/10/2006 0856   HDL 38 (L) 11/28/2020 0230   HDL 40 11/16/2019 0901   CHOLHDL 2.8 11/28/2020 0230   VLDL 23 11/28/2020 0230   LDLCALC 47 11/28/2020 0230   LDLCALC 44 11/16/2019 0901   LDLDIRECT 68.2 10/25/2010 1015     Physical Exam:    VS:  BP (!) 156/92   Pulse (!) 50   Ht 5' 10.5" (1.791 m)   Wt 259 lb (117.5 kg)   SpO2 95%   BMI 36.64 kg/m     Wt Readings from Last 3 Encounters:  10/10/21 259 lb (117.5 kg)  12/25/20 249 lb 6.4 oz (113.1 kg)  11/28/20 241 lb 6.5 oz (109.5 kg)     GEN:  Well nourished, well developed in no acute distress HEENT: Normal NECK: No JVD; No carotid bruits LYMPHATICS: No lymphadenopathy CARDIAC: RRR, no murmurs, rubs, gallops RESPIRATORY:  Clear to auscultation without rales, wheezing or rhonchi  ABDOMEN: Soft, non-tender, non-distended MUSCULOSKELETAL:  No edema; No deformity  SKIN: Warm and dry NEUROLOGIC:  Alert and oriented x 3 PSYCHIATRIC:  Normal affect   ASSESSMENT AND PLAN:    CAD s/p multiple stents and eventual CABG No angina.  Continue exercise regimen.  Continue aspirin, Plavix, beta-blocker, Imdur and PCSK9 inhibitor.  2.   Hyperlipidemia Continue Repatha  3.  Hypertension -Elevated.  He will restart amlodipine (will find bottle).  Continue other antihypertensive.  He will keep log of blood pressure for 10 days and send [place] for review.  No adjustment made today.   Medication Adjustments/Labs and Tests Ordered: Current medicines are reviewed at length with the patient today.  Concerns regarding medicines are outlined above.  No orders of the defined types were placed in this encounter.  No orders of the defined types were placed in this encounter.   Patient Instructions  Medication Instructions:  RESTART Norvasc (Amlodipine) Take 1 tablet once a day  *If you need a refill on your cardiac medications before your next appointment, please call your pharmacy*   Lab Work: None Ordered   Testing/Procedures: None Ordered   Follow-Up: At [facility], you and your health needs are our priority.  As part of our continuing mission to provide you with exceptional heart care, we have created designated Provider Care Teams.  These Care Teams include your primary Cardiologist (physician) and Advanced Practice Providers (APPs -  Physician Assistants and Nurse Practitioners) who all work together to provide you with the care you need, when you need it.  We recommend signing up for the patient portal called "MyChart".  Sign up information is provided on this After Visit Summary.  MyChart is used to connect with patients for Virtual Visits (Telemedicine).  Patients are able to view lab/test results, encounter notes, upcoming appointments, etc.  Non-urgent messages can be sent to your provider as well.   To learn more about what you  can do with MyChart, go to [email or web address].    Your next appointment:   6 month(s)  The format for your next appointment:   In Person  Provider:   Sherren Mocha, MD     Other Instructions   Important Information About Sugar         Signed, Leanor Kail, [place]  10/10/2021 9:12 AM    [place]

## 2021-10-10 ENCOUNTER — Encounter: Payer: Self-pay | Admitting: Physician Assistant

## 2021-10-10 ENCOUNTER — Ambulatory Visit: Payer: Commercial Managed Care - PPO | Attending: Physician Assistant | Admitting: Physician Assistant

## 2021-10-10 VITALS — BP 156/92 | HR 50 | Ht 70.5 in | Wt 259.0 lb

## 2021-10-10 DIAGNOSIS — E785 Hyperlipidemia, unspecified: Secondary | ICD-10-CM | POA: Diagnosis not present

## 2021-10-10 DIAGNOSIS — I25118 Atherosclerotic heart disease of native coronary artery with other forms of angina pectoris: Secondary | ICD-10-CM

## 2021-10-10 DIAGNOSIS — I10 Essential (primary) hypertension: Secondary | ICD-10-CM | POA: Diagnosis not present

## 2021-10-10 NOTE — Patient Instructions (Signed)
Medication Instructions:  RESTART Norvasc (Amlodipine) Take 1 tablet once a day  *If you need a refill on your cardiac medications before your next appointment, please call your pharmacy*   Lab Work: None Ordered   Testing/Procedures: None Ordered   Follow-Up: At [facility], you and your health needs are our priority.  As part of our continuing mission to provide you with exceptional heart care, we have created designated Provider Care Teams.  These Care Teams include your primary Cardiologist (physician) and Advanced Practice Providers (APPs -  Physician Assistants and Nurse Practitioners) who all work together to provide you with the care you need, when you need it.  We recommend signing up for the patient portal called "MyChart".  Sign up information is provided on this After Visit Summary.  MyChart is used to connect with patients for Virtual Visits (Telemedicine).  Patients are able to view lab/test results, encounter notes, upcoming appointments, etc.  Non-urgent messages can be sent to your provider as well.   To learn more about what you can do with MyChart, go to [email or web address].    Your next appointment:   6 month(s)  The format for your next appointment:   In Person  Provider:   Sherren Mocha, MD     Other Instructions   Important Information About Sugar

## 2021-11-07 ENCOUNTER — Other Ambulatory Visit: Payer: Self-pay

## 2021-11-07 ENCOUNTER — Telehealth: Payer: Self-pay

## 2021-11-07 MED ORDER — AMOXICILLIN 500 MG PO CAPS
500.0000 mg | ORAL_CAPSULE | Freq: Two times a day (BID) | ORAL | 1 refills | Status: DC
Start: 2021-11-07 — End: 2022-10-08

## 2021-11-07 NOTE — Telephone Encounter (Signed)
Prescription refilled as requested below.

## 2021-11-07 NOTE — Telephone Encounter (Signed)
-----   Message from Irene Shipper, MD sent at 11/07/2021  9:16 AM EDT ----- Vaughan Basta, Please refill Mr. Pomeroy's amoxicillin prescription.  Provide 1 additional refill. Please call this into [facility] on [address]. Thanks, Dr. Mamie Nick

## 2021-12-17 ENCOUNTER — Other Ambulatory Visit: Payer: Self-pay | Admitting: Cardiovascular Disease

## 2021-12-18 ENCOUNTER — Telehealth: Payer: Self-pay | Admitting: Cardiovascular Disease

## 2021-12-18 NOTE — Telephone Encounter (Signed)
Spoke with pt who states he would like to schedule an appointment to see provider to start GLP-1 for weight loss.  Pt states he is unable to see his PCP and is having to start on a large construction project out of town. Appointment scheduled with M.Swinyer, NP for 12/20/2021 at 850am.  Pt verbalizes understanding and thanked Rn for the call.

## 2021-12-18 NOTE — Telephone Encounter (Signed)
Patient would like to speak to [facility].

## 2021-12-19 NOTE — Telephone Encounter (Signed)
S/w pt is aware Sharyn Lull does not treat wt loss.  Pt is aware pharmacist treat wt loss. Michelle's appt is canceled and new pt appt for pharmacy is made for Tuesday, November 21 @ 2:30.

## 2021-12-19 NOTE — Progress Notes (Incomplete)
Cardiology Office Note:    Date:  12/19/2021   ID:  Leonard Edwards, DOB 05-01-1955, MRN [id number]  PCP:  Ginger Organ., MD   [facility] Providers Cardiologist:  Sherren Mocha, MD { Click to update primary MD,subspecialty MD or APP then REFRESH:1}    Referring MD: Ginger Organ., MD   Chief Complaint: ***  History of Present Illness:    Leonard Edwards is a *** 66 y.o. 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 multiple PCIs. b. CABG 04/2015. c. PCI to Cx 10/2015.   Esophageal stricture    Exertional angina ([facility]) 10/31/2015   Fibromyalgia    Gastric polyp    GERD (gastroesophageal reflux disease)    Hiatal hernia    High cholesterol    History of gout    Hyperlipidemia    Hypertension    Obesity    Pneumonia    hx of    Past Surgical History:  Procedure Laterality Date   CARDIAC CATHETERIZATION N/A 03/29/2015   Procedure: Left Heart Cath and Coronary Angiography;  Surgeon: Sherren Mocha, MD;  Location: [place] [facility];  Service: Cardiovascular;  Laterality: N/A;   CARDIAC CATHETERIZATION N/A 10/31/2015   Procedure: Left Heart Cath and Cors/Grafts Angiography;  Surgeon: Sherren Mocha, MD;  Location: [place] [facility];  Service: Cardiovascular;  Laterality: N/A;   CARDIAC CATHETERIZATION N/A 10/31/2015   Procedure: Coronary Stent Intervention;  Surgeon: Sherren Mocha, MD;  Location: [place] [facility];  Service: Cardiovascular;  Laterality: N/A;   CHOLECYSTECTOMY N/A 07/07/2017   Procedure: LAPAROSCOPIC CHOLECYSTECTOMY WITH INTRAOPERATIVE CHOLANGIOGRAM;  Surgeon: Excell Seltzer, MD;  Location: [facility];  Service: General;  Laterality: N/A;   COLONOSCOPY W/ POLYPECTOMY     CORONARY ANGIOPLASTY WITH STENT PLACEMENT  09/2004-10/2009   DES to distal RCA in 09/2004; DES to Ramus Intermediate 10/2009 following abnormal stress test    CORONARY ARTERY BYPASS GRAFT N/A 04/06/2015   Procedure: CORONARY ARTERY BYPASS GRAFTING (CABG) x three,  using bilateral mammaries, and right leg greater saphenous vein harvested endoscopically;  Surgeon: Gaye Pollack, MD;  Location: [place];  Service: Open Heart Surgery;  Laterality: N/A;   ESOPHAGOGASTRODUODENOSCOPY (EGD) WITH ESOPHAGEAL DILATION     KNEE SURGERY  04/2020   LEFT HEART CATH AND CORS/GRAFTS ANGIOGRAPHY N/A 11/28/2020   Procedure: LEFT HEART CATH AND CORS/GRAFTS ANGIOGRAPHY;  Surgeon: Troy Sine, MD;  Location: [place] [facility];  Service: Cardiovascular;  Laterality: N/A;   SHOULDER ARTHROSCOPY Right 2015   scar tissue   TEE WITHOUT CARDIOVERSION N/A 04/06/2015   Procedure: TRANSESOPHAGEAL ECHOCARDIOGRAM (TEE);  Surgeon: Gaye Pollack, MD;  Location: [place];  Service: Open Heart Surgery;  Laterality: N/A;   ULTRASOUND GUIDANCE FOR VASCULAR ACCESS  03/29/2015   Procedure: Ultrasound Guidance For Vascular Access;  Surgeon: Sherren Mocha, MD;  Location: [place] [facility];  Service: Cardiovascular;;   UMBILICAL HERNIA REPAIR N/A 07/07/2017   Procedure: HERNIA REPAIR UMBILICAL ADULT;  Surgeon: Excell Seltzer, MD;  Location[facility];  Service: General;  Laterality: N/A;    Current Medications: No outpatient medications have been marked as taking for the 12/20/21 encounter (Appointment) with Ann Maki, Lanice Schwab, NP.     Allergies:   Patient has no known allergies.   Social History   Socioeconomic History  Marital status: Married    Spouse name: Not on file   Number of children: 4   Years of education: College   Highest education level: Not on file  Occupational History    Employer: [employer]  Tobacco Use   Smoking status: Never   Smokeless tobacco: Never  Vaping Use   Vaping Use: Never used  Substance and Sexual Activity   Alcohol use: Yes    Alcohol/week: 5.0 standard drinks of alcohol    Types: 5 Glasses of wine per week   Drug use: No   Sexual  activity: Not Currently  Other Topics Concern   Not on file  Social History Narrative   Not on file   Social Determinants of Health   Financial Resource Strain: Not on file  Food Insecurity: Not on file  Transportation Needs: Not on file  Physical Activity: Not on file  Stress: Not on file  Social Connections: Not on file     Family History: The patient's ***family history includes Bladder Cancer in his mother; COPD in his mother; Diabetes type II in his sister; Heart attack in his father; Hypertension in his father; Lupus in his sister. There is no history of Colon cancer, Pancreatic cancer, or Stomach cancer.  ROS:   Please see the history of present illness.    *** All other systems reviewed and are negative.  Labs/Other Studies Reviewed:    The following studies were reviewed today: ***  Recent Labs: 12/25/2020: Magnesium 1.8 03/14/2021: BUN 13; Creatinine, Ser 0.83; Potassium 4.0; Sodium 141  Recent Lipid Panel    Component Value Date/Time   CHOL 108 11/28/2020 0230   CHOL 108 11/16/2019 0901   TRIG 113 11/28/2020 0230   TRIG 147 02/10/2006 0856   HDL 38 (L) 11/28/2020 0230   HDL 40 11/16/2019 0901   CHOLHDL 2.8 11/28/2020 0230   VLDL 23 11/28/2020 0230   LDLCALC 47 11/28/2020 0230   LDLCALC 44 11/16/2019 0901   LDLDIRECT 68.2 10/25/2010 1015     Risk Assessment/Calculations:   {Does this patient have ATRIAL FIBRILLATION?[phone]}       Physical Exam:    VS:  There were no vitals taken for this visit.    Wt Readings from Last 3 Encounters:  10/10/21 259 lb (117.5 kg)  12/25/20 249 lb 6.4 oz (113.1 kg)  11/28/20 241 lb 6.5 oz (109.5 kg)     GEN: *** Well nourished, well developed in no acute distress HEENT: Normal NECK: No JVD; No carotid bruits CARDIAC: ***RRR, no murmurs, rubs, gallops RESPIRATORY:  Clear to auscultation without rales, wheezing or rhonchi  ABDOMEN: Soft, non-tender, non-distended MUSCULOSKELETAL:  No edema; No deformity. ***  pedal pulses, ***bilaterally SKIN: Warm and dry NEUROLOGIC:  Alert and oriented x 3 PSYCHIATRIC:  Normal affect   EKG:  EKG is *** ordered today.  The ekg ordered today demonstrates ***  No BP recorded.  {Refresh Note OR Click here to enter BP  :1}***    Diagnoses:    No diagnosis found. Assessment and Plan:       {Are you ordering a CV Procedure (e.g. stress test, cath, DCCV, TEE, etc)?   Press F2        :[id number]}   Disposition:  Medication Adjustments/Labs and Tests Ordered: Current medicines are reviewed at length with the patient today.  Concerns regarding medicines are outlined above.  No orders of the defined types were placed in this encounter.  No orders of the defined types were  placed in this encounter.   There are no Patient Instructions on file for this visit.   Signed, Emmaline Life, NP  12/19/2021 6:01 AM    [place]

## 2021-12-20 ENCOUNTER — Ambulatory Visit: Payer: Commercial Managed Care - PPO | Admitting: Nurse Practitioner

## 2021-12-23 NOTE — Progress Notes (Unsigned)
HPI: Leonard Edwards is a 66 y.o. male patient referred to pharmacy clinic by Dr. Brigitte Pulse (PCP) to initiate weight loss therapy with GLP1-RA.  Most recent BMI 37. PMH significant for CAD, TIA, hypertension, HDL, Gout.    Patient presented to the clinic to discuss weight loss medications specifically GLP1. Reports doing regular exercise but diet needs lot of improvement. He does not eat lunch but he eats large dinner and snacks throughout the day. Will be seeing dietitian or nutritionist. Has not tried any weight los medications and not on any medications or medical conditions that could lead to weight gain. States heard lot about GLP1 and would like to try one to lose weight. Discussed the importance of life style interventions along with medication.  Educated patient on  appropriate administration techniques, does, titration schedule, importance of reaching to target dose, potential side effects and its management    Current weight management medications: none  Previously tried meds: none   Current meds that may affect weight: none  Baseline weight/BMI: 38 kg/m2   Insurance payor: [employer] PPO   Diet:  -Breakfast:eggs, toast, sausages sometimes  -Lunch: skips  -Dinner: eat out most days of the week eats large meal  -Snacks: sweets, peanuts -Drinks: water and diet coke     Exercise:  Swimming 50-55 min 4-5 days week and 3-5 miles walks 1-2 days per week  Family History:  Father died at 28 from heart attack  Mother lived until 8 bladder cancer  Sister- lupus   Confirmed patient has no personal or family history of medullary thyroid carcinoma (MTC) or Multiple Endocrine Neoplasia syndrome type 2 (MEN 2).   Social History:  Smoking: never Alcohol: 4-5 drinks per week   Labs: Lab Results  Component Value Date   HGBA1C 5.7 (H) 10/04/2019    Wt Readings from Last 1 Encounters:  12/24/21 265 lb 14.4 oz (120.6 kg)    BP Readings from Last 1 Encounters:  10/10/21  (!) 156/92   Pulse Readings from Last 1 Encounters:  10/10/21 (!) 50       Component Value Date/Time   CHOL 108 11/28/2020 0230   CHOL 108 11/16/2019 0901   TRIG 113 11/28/2020 0230   TRIG 147 02/10/2006 0856   HDL 38 (L) 11/28/2020 0230   HDL 40 11/16/2019 0901   CHOLHDL 2.8 11/28/2020 0230   VLDL 23 11/28/2020 0230   LDLCALC 47 11/28/2020 0230   LDLCALC 44 11/16/2019 0901   LDLDIRECT 68.2 10/25/2010 1015    Past Medical History:  Diagnosis Date   Ascending aorta dilation (HCC)    Benign prostatic hypertrophy    CAD (coronary artery disease)    a. multiple PCIs. b. CABG 04/2015. c. PCI to Cx 10/2015.   Esophageal stricture    Exertional angina (HCC) 10/31/2015   Fibromyalgia    Gastric polyp    GERD (gastroesophageal reflux disease)    Hiatal hernia    High cholesterol    History of gout    Hyperlipidemia    Hypertension    Obesity    Pneumonia    hx of    Current Outpatient Medications on File Prior to Visit  Medication Sig Dispense Refill   allopurinol (ZYLOPRIM) 300 MG tablet Take 300 mg by mouth daily.     amLODipine (NORVASC) 10 MG tablet Take 1 tablet (10 mg total) by mouth daily. 90 tablet 1   amoxicillin (AMOXIL) 500 MG capsule Take 1 capsule (500 mg total) by mouth 2 (  two) times daily. 20 capsule 1   aspirin EC 81 MG tablet Take 81 mg by mouth daily.     clopidogrel (PLAVIX) 75 MG tablet TAKE 1 TABLET(75 MG) BY MOUTH DAILY WITH BREAKFAST 90 tablet 2   Coenzyme Q10 (CO Q 10) 100 MG CAPS Take 100 mg by mouth daily.     colchicine 0.6 MG tablet Take 0.6 mg by mouth daily as needed (gout flare up).     Evolocumab (REPATHA SURECLICK) 376 MG/ML SOAJ Inject 140 mg into the skin every 14 (fourteen) days. 6 mL 3   fluticasone (FLONASE) 50 MCG/ACT nasal spray Place 2 sprays into both nostrils daily as needed for allergies or rhinitis.     hydrochlorothiazide (HYDRODIURIL) 25 MG tablet TAKE 1 TABLET(25 MG) BY MOUTH DAILY 90 tablet 2   indomethacin (INDOCIN) 50 MG  capsule TAKE 1 CAPSULE(50 MG) BY MOUTH THREE TIMES DAILY WITH MEALS 90 capsule 6   isosorbide mononitrate (IMDUR) 30 MG 24 hr tablet Take 1 tablet (30 mg total) by mouth daily. 30 tablet 11   levocetirizine (XYZAL) 5 MG tablet Take 5 mg by mouth daily as needed for allergies.     lisinopril (ZESTRIL) 20 MG tablet TAKE 1 TABLET(20 MG) BY MOUTH DAILY 90 tablet 2   metoprolol succinate (TOPROL-XL) 50 MG 24 hr tablet TAKE 1 TABLET(50 MG) BY MOUTH DAILY WITH OR IMMEDIATELY FOLLOWING A MEAL 90 tablet 2   montelukast (SINGULAIR) 10 MG tablet Take 10 mg by mouth daily.     nitroGLYCERIN (NITROSTAT) 0.4 MG SL tablet Place 1 tablet (0.4 mg total) under the tongue every 5 (five) minutes as needed for chest pain. 25 tablet 11   pantoprazole (PROTONIX) 40 MG tablet TAKE 1 TABLET(40 MG) BY MOUTH DAILY 90 tablet 2   No current facility-administered medications on file prior to visit.    No Known Allergies  Assessment/Plan   Obesity Assessment/Plan  Current BMI 38 kg/m2  Class 2 obesity with CAD and hx of TIA, dyslipidemia Patient has been exercising regularly most days of the week however dietary habits  could be improved ; will be seeing nutritionist and dietitian  Patient would like to go on one of GLP1 for weight management, cost is not an issue for the patient  Discussed the importance of life style interventions along with medication.  Educated patient on  appropriate administration techniques, does, titration schedule, importance of reaching to target dose, potential side effects and its management  Confirmed patient has no personal or family history of medullary thyroid carcinoma (MTC) or Multiple Endocrine Neoplasia syndrome type 2 (MEN 2) Will apply for PA for Wegovy, inform patient upon approval   Cammy Copa, Pharm.D [facility] of [facility] [address] [place], [place] [address]  Phone: [phone]; Fax: [phone]

## 2021-12-24 ENCOUNTER — Ambulatory Visit: Payer: Commercial Managed Care - PPO | Attending: Cardiology | Admitting: Student

## 2021-12-24 VITALS — Wt 265.9 lb

## 2021-12-24 DIAGNOSIS — E669 Obesity, unspecified: Secondary | ICD-10-CM | POA: Insufficient documentation

## 2021-12-24 DIAGNOSIS — Z6835 Body mass index (BMI) 35.0-35.9, adult: Secondary | ICD-10-CM

## 2021-12-24 NOTE — Assessment & Plan Note (Addendum)
Assessment/Plan  Current BMI 38 kg/m2  Class 2 obesity with CAD and hx of TIA, dyslipidemia Patient has been exercising regularly most days of the week however dietary habits  could be improved ; will be seeing nutritionist and dietitian  Patient would like to go on one of GLP1 for weight management, cost is not an issue for the patient  Discussed the importance of life style interventions along with medication.  Educated patient on  appropriate administration techniques, does, titration schedule, importance of reaching to target dose, potential side effects and its management  Confirmed patient has no personal or family history of medullary thyroid carcinoma (MTC) or Multiple Endocrine Neoplasia syndrome type 2 (MEN 2) Will apply for PA for [facility], inform patient upon approval

## 2021-12-25 ENCOUNTER — Telehealth: Payer: Self-pay | Admitting: Pharmacist

## 2021-12-25 NOTE — Telephone Encounter (Addendum)
Applied for [employer] PA.  (Key: [facility]) PA denied. Patient has been informed. Patient said he will pay for it and he found some coupon. Also informed patient that there was a supply issue with most Wegovy strengths. Rx for Wegovy 2.5 mg once weekly 1 month supply sent to patient's preferred pharmacy   Patient to  inform us if he manage to get the [facility] 2.5 mg filed and start the therapy. Will titrate the dose in 4 weeks to the next level if 2.5 mg tolerated well.

## 2021-12-30 MED ORDER — WEGOVY 0.25 MG/0.5ML ~~LOC~~ SOAJ: 0.2500 mg | SUBCUTANEOUS | 0 refills | Status: DC

## 2022-01-08 ENCOUNTER — Telehealth: Payer: Self-pay | Admitting: Cardiovascular Disease

## 2022-01-08 NOTE — Telephone Encounter (Signed)
Pt c/o medication issue:  1. Name of Medication:   Semaglutide-Weight Management (WEGOVY) 0.25 MG/0.5ML SOAJ    2. How are you currently taking this medication (dosage and times per day)? Inject 0.25 mg into the skin once a week.   3. Are you having a reaction (difficulty breathing--STAT)? No  4. What is your medication issue? Pt would like a callback regarding medication. He states that he has questions for the Pharmacist. Please advise

## 2022-01-09 MED ORDER — ZEPBOUND 2.5 MG/0.5ML ~~LOC~~ SOAJ: 2.5000 mg | SUBCUTANEOUS | 0 refills | Status: DC

## 2022-01-09 NOTE — Telephone Encounter (Addendum)
Spoke to patient, there is supply issue on starting dose Wegovy but his community pharmacist at [facility] confirms that they would be able to order starting dose Zepbound. Per patient's request prescription for Zepbound 2.5 mg for 1 month sent tot the patient's preferred pharmacy.  Patient to call if has any issue tolerating medication. Potential side effects and appropriate administration techniques were discussed

## 2022-01-09 NOTE — Telephone Encounter (Signed)
Call N/A LVM.

## 2022-01-09 NOTE — Addendum Note (Signed)
Addended by: Anda Latina on: 01/09/2022 02:55 PM   Modules accepted: Orders

## 2022-01-30 ENCOUNTER — Other Ambulatory Visit: Payer: Self-pay | Admitting: Cardiovascular Disease

## 2022-01-30 MED ORDER — ISOSORBIDE MONONITRATE ER 30 MG PO TB24: 30.0000 mg | ORAL_TABLET | Freq: Every day | ORAL | 11 refills | Status: DC

## 2022-01-30 NOTE — Addendum Note (Signed)
Addended by: Vashti Hey on: 01/30/2022 09:16 AM   Modules accepted: Orders

## 2022-02-04 ENCOUNTER — Telehealth: Payer: Self-pay | Admitting: Cardiovascular Disease

## 2022-02-04 NOTE — Telephone Encounter (Signed)
*  STAT* If patient is at the pharmacy, call can be transferred to refill team.   1. Which medications need to be refilled? (please list name of each medication and dose if known)   Tirzepatide-Weight Management (ZEPBOUND) 2.5 MG/0.5ML SOAJ   2. Which pharmacy/location (including street and city if local pharmacy) is medication to be sent to?  [facility] [place]   3. Do they need a 30 day or 90 day supply? 30 day  Patient stated he is calling to report he had no problems with taking the Tirzepatide-Weight Management (ZEPBOUND) 2.5 MG/0.5ML SOAJ and will now need a refill.  Patient stated he will need a call back from the pharmacist.

## 2022-02-05 MED ORDER — ZEPBOUND 5 MG/0.5ML ~~LOC~~ SOAJ: 5.0000 mg | SUBCUTANEOUS | 0 refills | Status: DC

## 2022-02-06 ENCOUNTER — Other Ambulatory Visit (HOSPITAL_COMMUNITY): Payer: Self-pay

## 2022-02-06 ENCOUNTER — Telehealth: Payer: Self-pay

## 2022-02-06 NOTE — Telephone Encounter (Signed)
Pharmacy Patient Advocate Encounter  Prior Authorization for REPATHA 140 MG/ML INJ has been approved.    PA# [id number] Effective dates: 02/06/22 through 02/07/23  Karie Soda, [place] Patient Advocate Specialist Direct Number: [phone] Fax: [phone]

## 2022-02-06 NOTE — Telephone Encounter (Signed)
Pharmacy Patient Advocate Encounter   Received notification from [facility] that prior authorization for REPATHA 140 MG/ML INJ is needed.    PA submitted on 02/06/22 Key [id number] Status is pending  Karie Soda, [place] Patient Advocate Specialist Direct Number: [phone] Fax: [phone]

## 2022-02-07 NOTE — Telephone Encounter (Signed)
Spoke to patient. He is tolerating 2.5 mg dose well without side effects. Will titrate Zepbound up to 5 mg every week for 4 weeks. F/u in 4 weeks to assess tolerability.  Updated prescription sent to preferred [facility].

## 2022-02-19 ENCOUNTER — Other Ambulatory Visit: Payer: Self-pay | Admitting: Cardiology

## 2022-02-19 DIAGNOSIS — Z01812 Encounter for preprocedural laboratory examination: Secondary | ICD-10-CM

## 2022-02-19 DIAGNOSIS — I10 Essential (primary) hypertension: Secondary | ICD-10-CM

## 2022-02-19 DIAGNOSIS — I25118 Atherosclerotic heart disease of native coronary artery with other forms of angina pectoris: Secondary | ICD-10-CM

## 2022-02-19 NOTE — Progress Notes (Signed)
BMET for CT angio in March 2024.

## 2022-02-26 MED ORDER — ZEPBOUND 7.5 MG/0.5ML ~~LOC~~ SOAJ: 7.5000 mg | SUBCUTANEOUS | 0 refills | Status: DC

## 2022-02-26 NOTE — Addendum Note (Signed)
Addended by: Anda Latina on: 02/26/2022 10:03 AM   Modules accepted: Orders

## 2022-02-26 NOTE — Telephone Encounter (Signed)
Call to follow up with patient on Zepbound. Patient reports he is tolerating 5 mg dose well without any side effects. He notices  he does not have cravings for food and he eats less than he use to. He is down 18 lbs. His goal is to loose 18-20 lbs more. He exercises 4-5 days per week (60 min per day). He is in agreement to titrate dose to next level 7.5 mg weekly.  Will follow up in 4 weeks.

## 2022-03-05 ENCOUNTER — Telehealth: Payer: Self-pay | Admitting: Cardiovascular Disease

## 2022-03-05 NOTE — Telephone Encounter (Signed)
Call back with ICD 10 code E66.9. Patient pays cash but they need it for discount card.

## 2022-03-05 NOTE — Telephone Encounter (Signed)
Pt c/o medication issue:  1. Name of Medication: tirzepatide (ZEPBOUND) 7.5 MG/0.5ML Pen   2. How are you currently taking this medication (dosage and times per day)?   3. Are you having a reaction (difficulty breathing--STAT)?   4. What is your medication issue? Pharmacy told patient that this med needs an ICD Code. Requesting call back.

## 2022-03-13 ENCOUNTER — Other Ambulatory Visit: Payer: Self-pay | Admitting: Cardiovascular Disease

## 2022-03-17 ENCOUNTER — Other Ambulatory Visit: Payer: Self-pay | Admitting: Cardiovascular Disease

## 2022-03-19 ENCOUNTER — Other Ambulatory Visit: Payer: Self-pay | Admitting: Cardiovascular Disease

## 2022-03-24 ENCOUNTER — Telehealth: Payer: Self-pay | Admitting: Pharmacist

## 2022-03-24 MED ORDER — ZEPBOUND 10 MG/0.5ML ~~LOC~~ SOAJ: 10.0000 mg | SUBCUTANEOUS | 0 refills | Status: DC

## 2022-03-24 NOTE — Telephone Encounter (Signed)
Patient has been tolerating 7.5 mg dose well without side effects. Has lost 30 lbs. He is doing regular exercise along with Zepbound  He was able to take only 3 doses of 7.5 mg as one device was faulty and he has to send it to [facility] for further investigation  As he is tolerating 7.5 dose well we are in agreement to up the dose to 10 mg per week next week.

## 2022-03-24 NOTE — Telephone Encounter (Signed)
Pt c/o medication issue:  1. Name of Medication:tirzepatide (ZEPBOUND) 7.5 MG/0.5ML Pen   2. How are you currently taking this medication (dosage and times per day)? 1 shot every week  3. Are you having a reaction (difficulty breathing--STAT)? no  4. What is your medication issue? Patient requesting to speak with Criss Alvine in regards to getting his monthly prescription for zepbound.

## 2022-03-29 ENCOUNTER — Other Ambulatory Visit: Payer: Self-pay | Admitting: Cardiovascular Disease

## 2022-04-07 ENCOUNTER — Ambulatory Visit: Payer: Commercial Managed Care - PPO | Attending: Cardiology

## 2022-04-07 DIAGNOSIS — I25118 Atherosclerotic heart disease of native coronary artery with other forms of angina pectoris: Secondary | ICD-10-CM

## 2022-04-07 DIAGNOSIS — Z01812 Encounter for preprocedural laboratory examination: Secondary | ICD-10-CM

## 2022-04-07 DIAGNOSIS — I10 Essential (primary) hypertension: Secondary | ICD-10-CM

## 2022-04-08 ENCOUNTER — Telehealth: Payer: Self-pay

## 2022-04-08 LAB — BASIC METABOLIC PANEL
BUN/Creatinine Ratio: 19 (ref 10–24)
BUN: 17 mg/dL (ref 8–27)
CO2: 22 mmol/L (ref 20–29)
Calcium: 9.4 mg/dL (ref 8.6–10.2)
Chloride: 101 mmol/L (ref 96–106)
Creatinine, Ser: 0.9 mg/dL (ref 0.76–1.27)
Glucose: 82 mg/dL (ref 70–99)
Potassium: 4.3 mmol/L (ref 3.5–5.2)
Sodium: 141 mmol/L (ref 134–144)
eGFR: 94 mL/min/{1.73_m2} (ref >59)

## 2022-04-08 NOTE — Telephone Encounter (Signed)
Normal results reviewed with patient who verbalizes understanding.

## 2022-04-08 NOTE — Telephone Encounter (Signed)
-----   Message from Werner Lean, MD sent at 04/08/2022  1:56 PM EST ----- Testing Results WNL.  No change to diagnostic or therapeutic plan.

## 2022-04-09 ENCOUNTER — Encounter (HOSPITAL_BASED_OUTPATIENT_CLINIC_OR_DEPARTMENT_OTHER): Payer: Self-pay

## 2022-04-09 ENCOUNTER — Ambulatory Visit (HOSPITAL_BASED_OUTPATIENT_CLINIC_OR_DEPARTMENT_OTHER)
Admission: RE | Admit: 2022-04-09 | Discharge: 2022-04-09 | Disposition: A | Discharge status: Home / Self Care | Payer: Commercial Managed Care - PPO | Source: Ambulatory Visit | Attending: Cardiology | Admitting: Cardiology

## 2022-04-09 DIAGNOSIS — I77810 Thoracic aortic ectasia: Secondary | ICD-10-CM | POA: Diagnosis present

## 2022-04-09 MED ORDER — IOHEXOL 350 MG/ML SOLN
100.0000 mL | Freq: Once | INTRAVENOUS | Status: AC | PRN
  Administered 2022-04-09: 75 mL via INTRAVENOUS

## 2022-04-10 ENCOUNTER — Inpatient Hospital Stay: Admission: RE | Admit: 2022-04-10 | Payer: Commercial Managed Care - PPO | Source: Ambulatory Visit

## 2022-04-11 ENCOUNTER — Telehealth: Payer: Self-pay

## 2022-04-11 NOTE — Telephone Encounter (Signed)
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 Will continue with annual CT scans.

## 2022-04-11 NOTE — Telephone Encounter (Signed)
Reviewed results with patient, who states he is aware of his mild dilation of the aorta. He states he gets annual CT scans to keep an eye on it.

## 2022-04-21 ENCOUNTER — Telehealth: Payer: Self-pay | Admitting: Cardiovascular Disease

## 2022-04-21 ENCOUNTER — Other Ambulatory Visit: Payer: Self-pay | Admitting: Cardiovascular Disease

## 2022-04-21 NOTE — Telephone Encounter (Signed)
Pt c/o medication issue:  1. Name of Medication:   tirzepatide (ZEPBOUND) 10 MG/0.5ML Pen    2. How are you currently taking this medication (dosage and times per day)?    3. Are you having a reaction (difficulty breathing--STAT)? no  4. What is your medication issue? Calling to get medication sent out. Please advise  Patient is asking the nurse give him a call

## 2022-04-22 MED ORDER — ZEPBOUND 12.5 MG/0.5ML ~~LOC~~ SOAJ: 12.5000 mg | SUBCUTANEOUS | 0 refills | Status: DC

## 2022-04-22 NOTE — Telephone Encounter (Signed)
Zepbound dose titration to next dose -12.5 mg once week. Tolerates 10 mg dose well. Total of 32 lbs weight loss.

## 2022-05-15 ENCOUNTER — Telehealth: Payer: Self-pay | Admitting: Cardiovascular Disease

## 2022-05-15 NOTE — Telephone Encounter (Signed)
Pt c/o medication issue:  1. Name of Medication:   tirzepatide (ZEPBOUND) 12.5 MG/0.5ML Pen   2. How are you currently taking this medication (dosage and times per day)?   3. Are you having a reaction (difficulty breathing--STAT)?   4. What is your medication issue?   Patient wants to get refill for this medication. Patient stated the dosage needs to be increased to 15 mg.

## 2022-05-16 MED ORDER — ZEPBOUND 12.5 MG/0.5ML ~~LOC~~ SOAJ: 12.5000 mg | SUBCUTANEOUS | 0 refills | Status: DC

## 2022-05-16 NOTE — Telephone Encounter (Signed)
Weight loss 40-42 lbs in total since on Zepbound. Have been tolerating 12.5 mg weekly dose without any trouble. However his pharmacy does not have 15 mg dose in stock so will continue 12.5 mg for 4 more weeks and will titrate to 15 mg in 4 weeks.

## 2022-06-03 ENCOUNTER — Telehealth: Payer: Self-pay | Admitting: Cardiovascular Disease

## 2022-06-03 NOTE — Telephone Encounter (Signed)
Pt c/o medication issue:  1. Name of Medication:   tirzepatide (ZEPBOUND) 12.5 MG/0.5ML Pen   2. How are you currently taking this medication (dosage and times per day)?   As prescribed  3. Are you having a reaction (difficulty breathing--STAT)?  No  4. What is your medication issue?    Patient states he is following up on getting this medication.  Patient states her received an email from [employer] telling him they were having shortages of this medications.

## 2022-06-04 MED ORDER — ZEPBOUND 15 MG/0.5ML ~~LOC~~ SOAJ: 15.0000 mg | SUBCUTANEOUS | 1 refills | Status: DC

## 2022-06-11 ENCOUNTER — Other Ambulatory Visit: Payer: Self-pay | Admitting: Cardiovascular Disease

## 2022-06-25 ENCOUNTER — Telehealth: Payer: Self-pay

## 2022-06-25 ENCOUNTER — Other Ambulatory Visit (HOSPITAL_COMMUNITY): Payer: Self-pay

## 2022-06-25 NOTE — Telephone Encounter (Signed)
Pharmacy Patient Advocate Encounter  Prior Authorization for Reginia Forts has been approved.    PA# [id number] Effective dates: 06/25/22 through 12/26/22  Haze Rushing, CPhT Pharmacy Patient Advocate Specialist Direct Number: [phone] Fax: [phone]

## 2022-06-25 NOTE — Telephone Encounter (Signed)
Pharmacy Patient Advocate Encounter   Received notification from [facility] that prior authorization for ZEPBOUND is needed.    PA submitted on 06/25/22 Key [id number] Status is pending  Haze Rushing, CPhT Pharmacy Patient Advocate Specialist Direct Number: [phone] Fax: [phone]

## 2022-07-01 ENCOUNTER — Other Ambulatory Visit: Payer: Self-pay | Admitting: Cardiovascular Disease

## 2022-07-07 ENCOUNTER — Other Ambulatory Visit: Payer: Self-pay | Admitting: Internal Medicine

## 2022-09-02 ENCOUNTER — Other Ambulatory Visit: Payer: Self-pay | Admitting: Cardiovascular Disease

## 2022-09-13 ENCOUNTER — Other Ambulatory Visit: Payer: Self-pay | Admitting: Cardiovascular Disease

## 2022-10-07 ENCOUNTER — Other Ambulatory Visit: Payer: Self-pay | Admitting: Pharmacist

## 2022-10-07 DIAGNOSIS — I25118 Atherosclerotic heart disease of native coronary artery with other forms of angina pectoris: Secondary | ICD-10-CM

## 2022-10-07 DIAGNOSIS — E66812 Obesity, class 2: Secondary | ICD-10-CM

## 2022-10-07 MED ORDER — ZEPBOUND 15 MG/0.5ML ~~LOC~~ SOAJ: 15.0000 mg | SUBCUTANEOUS | 0 refills | Status: DC

## 2022-10-08 ENCOUNTER — Encounter: Payer: Self-pay | Admitting: Internal Medicine

## 2022-10-08 ENCOUNTER — Telehealth: Payer: Self-pay

## 2022-10-08 ENCOUNTER — Ambulatory Visit (INDEPENDENT_AMBULATORY_CARE_PROVIDER_SITE_OTHER): Payer: Commercial Managed Care - PPO | Admitting: Internal Medicine

## 2022-10-08 VITALS — BP 124/80 | HR 67 | Ht 70.0 in | Wt 221.0 lb

## 2022-10-08 DIAGNOSIS — Z1211 Encounter for screening for malignant neoplasm of colon: Secondary | ICD-10-CM

## 2022-10-08 DIAGNOSIS — K219 Gastro-esophageal reflux disease without esophagitis: Secondary | ICD-10-CM

## 2022-10-08 DIAGNOSIS — Z86010 Personal history of colonic polyps: Secondary | ICD-10-CM

## 2022-10-08 DIAGNOSIS — J321 Chronic frontal sinusitis: Secondary | ICD-10-CM

## 2022-10-08 DIAGNOSIS — M1000 Idiopathic gout, unspecified site: Secondary | ICD-10-CM | POA: Diagnosis not present

## 2022-10-08 MED ORDER — PANTOPRAZOLE SODIUM 40 MG PO TBEC: 40.0000 mg | DELAYED_RELEASE_TABLET | Freq: Every day | ORAL | 3 refills | Status: DC | PRN

## 2022-10-08 MED ORDER — INDOMETHACIN 50 MG PO CAPS: 50.0000 mg | ORAL_CAPSULE | Freq: Three times a day (TID) | ORAL | 2 refills | Status: DC

## 2022-10-08 MED ORDER — NA SULFATE-K SULFATE-MG SULF 17.5-3.13-1.6 GM/177ML PO SOLN: 1.0000 | Freq: Once | ORAL | 0 refills | Status: AC

## 2022-10-08 MED ORDER — AMOXICILLIN 500 MG PO CAPS: 500.0000 mg | ORAL_CAPSULE | Freq: Two times a day (BID) | ORAL | 2 refills | Status: DC

## 2022-10-08 NOTE — Progress Notes (Signed)
HISTORY OF PRESENT ILLNESS:  Leonard Edwards is a 67 y.o. male with multiple significant medical problems as listed below including coronary artery disease with prior bypass.  He is on chronic Plavix therapy.  He presents today regarding surveillance colonoscopy and GERD.  The patient was last seen in this office April 2019 regarding symptomatic cholelithiasis.  He subsequently underwent laparoscopic cholecystectomy.  His last colonoscopy was January 2015.  He was found to have a diminutive colon polyp which appeared to be a sessile serrated polyp.  No pathology obtained as the polyp was not successfully retrieved.  Follow-up in 5 years recommended.  He was also noted to have pandiverticulosis.  Tells me that he is doing well.  He does have intermittent problems with acid reflux for which she takes pantoprazole on demand.  He also takes indomethacin periodically for gout and joint pain.  He has had intermittent issues with acute sinusitis for which she takes amoxicillin on demand.  I have prescribed this for him previously.  He is pleased to report significant weight loss with a combination of exercise and Zepbound.  REVIEW OF SYSTEMS:  All non-GI ROS negative unless otherwise stated in the HPI.  Past Medical History:  Diagnosis Date   Ascending aorta dilation (HCC)    Benign prostatic hypertrophy    CAD (coronary artery disease)    a. multiple PCIs. b. CABG 04/2015. c. PCI to Cx 10/2015.   Esophageal stricture    Exertional angina 10/31/2015   Fibromyalgia    Gastric polyp    GERD (gastroesophageal reflux disease)    Hiatal hernia    High cholesterol    History of gout    Hyperlipidemia    Hypertension    Obesity    Pneumonia    hx of    Past Surgical History:  Procedure Laterality Date   CARDIAC CATHETERIZATION N/A 03/29/2015   Procedure: Left Heart Cath and Coronary Angiography;  Surgeon: Tonny Bollman, MD;  Location: [facility] INVASIVE CV LAB;  Service: Cardiovascular;   Laterality: N/A;   CARDIAC CATHETERIZATION N/A 10/31/2015   Procedure: Left Heart Cath and Cors/Grafts Angiography;  Surgeon: Tonny Bollman, MD;  Location: [facility] INVASIVE CV LAB;  Service: Cardiovascular;  Laterality: N/A;   CARDIAC CATHETERIZATION N/A 10/31/2015   Procedure: Coronary Stent Intervention;  Surgeon: Tonny Bollman, MD;  Location: [facility] INVASIVE CV LAB;  Service: Cardiovascular;  Laterality: N/A;   CHOLECYSTECTOMY N/A 07/07/2017   Procedure: LAPAROSCOPIC CHOLECYSTECTOMY WITH INTRAOPERATIVE CHOLANGIOGRAM;  Surgeon: Glenna Fellows, MD;  Location: [facility] ORS;  Service: General;  Laterality: N/A;   COLONOSCOPY W/ POLYPECTOMY     CORONARY ANGIOPLASTY WITH STENT PLACEMENT  09/2004-10/2009   DES to distal RCA in 09/2004; DES to Ramus Intermediate 10/2009 following abnormal stress test   CORONARY ARTERY BYPASS GRAFT N/A 04/06/2015   Procedure: CORONARY ARTERY BYPASS GRAFTING (CABG) x three,  using bilateral mammaries, and right leg greater saphenous vein harvested endoscopically;  Surgeon: Alleen Borne, MD;  Location: [facility] OR;  Service: Open Heart Surgery;  Laterality: N/A;   ESOPHAGOGASTRODUODENOSCOPY (EGD) WITH ESOPHAGEAL DILATION     KNEE SURGERY  04/2020   LEFT HEART CATH AND CORS/GRAFTS ANGIOGRAPHY N/A 11/28/2020   Procedure: LEFT HEART CATH AND CORS/GRAFTS ANGIOGRAPHY;  Surgeon: Lennette Bihari, MD;  Location: [facility] INVASIVE CV LAB;  Service: Cardiovascular;  Laterality: N/A;   SHOULDER ARTHROSCOPY Right 2015   scar tissue   TEE WITHOUT CARDIOVERSION N/A 04/06/2015   Procedure: TRANSESOPHAGEAL ECHOCARDIOGRAM (TEE);  Surgeon: Alleen Borne, MD;  Location: [facility] OR;  Service: Open Heart Surgery;  Laterality: N/A;   ULTRASOUND GUIDANCE FOR VASCULAR ACCESS  03/29/2015   Procedure: Ultrasound Guidance For Vascular Access;  Surgeon: Tonny Bollman, MD;  Location: [facility] INVASIVE CV LAB;  Service: Cardiovascular;;   UMBILICAL HERNIA REPAIR N/A 07/07/2017   Procedure: HERNIA REPAIR UMBILICAL ADULT;   Surgeon: Glenna Fellows, MD;  Location: [facility];  Service: General;  Laterality: N/A;    Social History Celso JADEL WYSONG  reports that he has never smoked. He has never used smokeless tobacco. He reports current alcohol use of about 5.0 standard drinks of alcohol per week. He reports that he does not use drugs.  family history includes Bladder Cancer in his mother; COPD in his mother; Diabetes type II in his sister; Heart attack in his father; Hypertension in his father; Lupus in his sister.  No Known Allergies     PHYSICAL EXAMINATION: Vital signs: BP 124/80   Pulse 67   Ht 5\' 10"  (1.778 m)   Wt 221 lb (100.2 kg)   BMI 31.71 kg/m   Constitutional: generally well-appearing, no acute distress Psychiatric: alert and oriented x3, cooperative Eyes: extraocular movements intact, anicteric, conjunctiva pink Mouth: oral pharynx moist, no lesions Neck: supple no lymphadenopathy Cardiovascular: heart regular rate and rhythm, no murmur Lungs: clear to auscultation bilaterally Abdomen: soft, nontender, nondistended, no obvious ascites, no peritoneal signs, normal bowel sounds, no organomegaly Rectal: Deferred until colonoscopy Extremities: no clubbing, cyanosis, or lower extremity edema bilaterally.  Venous insufficiency changes Skin: no element lesions on visible extremities Neuro: No focal deficits.  Cranial nerves intact  ASSESSMENT:  1.  History of colon polyp as described 2015.  Due for surveillance 2.  Multiple medical problems including coronary artery disease on Plavix 3.  GERD.  Taking PPI sporadically 4.  Gout 5.  Sinusitis 6.  Multiple other medical problems.  Stable   PLAN:  1.  Reflux precautions 2.  Prescribe pantoprazole 40 mg daily.  Multiple refills.  I told him to take this medication daily not only for his reflux, but also for upper GI mucosal protection in a patient with multiple comorbidities on both aspirin and Plavix who uses NSAIDs intermittently. 3.   Refill indomethacin.  Use on demand 4.  Refill amoxicillin.  Use on demand 5.  Schedule surveillance colonoscopy.  Patient is high risk given his comorbidities and the need to address antiplatelet therapy.The nature of the procedure, as well as the risks, benefits, and alternatives were carefully and thoroughly reviewed with the patient. Ample time for discussion and questions allowed. The patient understood, was satisfied, and agreed to proceed. 6.  Hold Plavix 5 days prior to the procedure.  Will check with Dr. Excell Seltzer.  Stay on aspirin throughout

## 2022-10-08 NOTE — Telephone Encounter (Signed)
   Name: Leonard Edwards  DOB: 1955/03/15  MRN: [id number]  Primary Cardiologist: Tonny Bollman, MD  Chart reviewed as part of pre-operative protocol coverage. Because of Leonard Edwards's past medical history and time since last visit, he will require a follow-up in-office visit in order to better assess preoperative cardiovascular risk.  Pre-op covering staff: - Please schedule appointment and call patient to inform them. If patient already had an upcoming appointment within acceptable timeframe, please add "pre-op clearance" to the appointment notes so provider is aware. - Please contact requesting surgeon's office via preferred method (i.e, phone, fax) to inform them of need for appointment prior to surgery.   Joylene Grapes, NP  10/08/2022, 11:52 AM

## 2022-10-08 NOTE — Telephone Encounter (Signed)
Left message to call back to schedule an in office appt for pre op clearance.

## 2022-10-08 NOTE — Telephone Encounter (Signed)
[facility] Pre-operative Risk Assessment     Request for surgical clearance:     Endoscopy Procedure  What type of surgery is being performed?     colonoscopy  When is this surgery scheduled?     11/26/2022  What type of clearance is required ?   Pharmacy  Are there any medications that need to be held prior to surgery and how long? Plavix - 5 days  Practice name and name of physician performing surgery?      [facility]  What is your office phone and fax number?      Phone- [phone]  Fax- [phone]  Anesthesia type (None, local, MAC, general) ?       MAC

## 2022-10-08 NOTE — Patient Instructions (Addendum)
  We have sent the following medications to your pharmacy for you to pick up at your convenience:  Pantoprazole, Indomethacin, Amoxicillin  You have been scheduled for a colonoscopy. Please follow written instructions given to you at your visit today.   Please pick up your prep supplies at the pharmacy within the next 1-3 days.  If you use inhalers (even only as needed), please bring them with you on the day of your procedure.  DO NOT TAKE 7 DAYS PRIOR TO TEST- Trulicity (dulaglutide) Ozempic, Wegovy (semaglutide) Mounjaro (tirzepatide) Bydureon Bcise (exanatide extended release)  DO NOT TAKE 1 DAY PRIOR TO YOUR TEST Rybelsus (semaglutide) Adlyxin (lixisenatide) Victoza (liraglutide) Byetta (exanatide)  _______________________________________________________  If your blood pressure at your visit was 140/90 or greater, please contact your primary care physician to follow up on this.  _______________________________________________________  If you are age 23 or older, your body mass index should be between 23-30. Your Body mass index is 31.71 kg/m. If this is out of the aforementioned range listed, please consider follow up with your Primary Care Provider.  If you are age 61 or younger, your body mass index should be between 19-25. Your Body mass index is 31.71 kg/m. If this is out of the aformentioned range listed, please consider follow up with your Primary Care Provider.   ________________________________________________________  The [facility] providers would like to encourage you to use [facility] to communicate with providers for non-urgent requests or questions.  Due to long hold times on the telephone, sending your provider a message by [facility] may be a faster and more efficient way to get a response.  Please allow 48 business hours for a response.  Please remember that this is for non-urgent requests.  _______________________________________________________

## 2022-10-10 ENCOUNTER — Telehealth: Payer: Self-pay

## 2022-10-10 NOTE — Telephone Encounter (Signed)
Sent anticoag clearance to pre-op.  Patient scheduled to be seen by cardiology on 9/17

## 2022-10-10 NOTE — Telephone Encounter (Signed)
Spoke with patient who is agreeable to see Dr. Excell Seltzer on 9/17 at 2 pm. I will fax via Epic to requesting surgeon's office.

## 2022-10-21 ENCOUNTER — Ambulatory Visit: Payer: Commercial Managed Care - PPO | Attending: Cardiovascular Disease | Admitting: Cardiovascular Disease

## 2022-10-21 NOTE — Progress Notes (Deleted)
Cardiology Office Note:    Date:  10/21/2022   ID:  Leonard Edwards, DOB [birth year], MRN [id number]  PCP:  Cleatis Polka., MD   [facility] Providers Cardiologist:  Tonny Bollman, MD     Referring MD: Cleatis Polka., MD   No chief complaint on file.   History of Present Illness:    Leonard Edwards is a 67 y.o. male with a hx of ***  Past Medical History:  Diagnosis Date   Ascending aorta dilation (HCC)    Benign prostatic hypertrophy    CAD (coronary artery disease)    a. multiple PCIs. b. CABG 04/2015. c. PCI to Cx 10/2015.   Esophageal stricture    Exertional angina 10/31/2015   Fibromyalgia    Gastric polyp    GERD (gastroesophageal reflux disease)    Hiatal hernia    High cholesterol    History of gout    Hyperlipidemia    Hypertension    Obesity    Pneumonia    hx of    Past Surgical History:  Procedure Laterality Date   CARDIAC CATHETERIZATION N/A 03/29/2015   Procedure: Left Heart Cath and Coronary Angiography;  Surgeon: Tonny Bollman, MD;  Location: [facility] INVASIVE CV LAB;  Service: Cardiovascular;  Laterality: N/A;   CARDIAC CATHETERIZATION N/A 10/31/2015   Procedure: Left Heart Cath and Cors/Grafts Angiography;  Surgeon: Tonny Bollman, MD;  Location: [facility] INVASIVE CV LAB;  Service: Cardiovascular;  Laterality: N/A;   CARDIAC CATHETERIZATION N/A 10/31/2015   Procedure: Coronary Stent Intervention;  Surgeon: Tonny Bollman, MD;  Location: [facility] INVASIVE CV LAB;  Service: Cardiovascular;  Laterality: N/A;   CHOLECYSTECTOMY N/A 07/07/2017   Procedure: LAPAROSCOPIC CHOLECYSTECTOMY WITH INTRAOPERATIVE CHOLANGIOGRAM;  Surgeon: Glenna Fellows, MD;  Location: [facility];  Service: General;  Laterality: N/A;   COLONOSCOPY W/ POLYPECTOMY     CORONARY ANGIOPLASTY WITH STENT PLACEMENT  09/2004-10/2009   DES to distal RCA in 09/2004; DES to Ramus Intermediate 10/2009 following abnormal stress test   CORONARY ARTERY BYPASS GRAFT N/A 04/06/2015   Procedure:  CORONARY ARTERY BYPASS GRAFTING (CABG) x three,  using bilateral mammaries, and right leg greater saphenous vein harvested endoscopically;  Surgeon: Alleen Borne, MD;  Location: [facility] OR;  Service: Open Heart Surgery;  Laterality: N/A;   ESOPHAGOGASTRODUODENOSCOPY (EGD) WITH ESOPHAGEAL DILATION     KNEE SURGERY  04/2020   LEFT HEART CATH AND CORS/GRAFTS ANGIOGRAPHY N/A 11/28/2020   Procedure: LEFT HEART CATH AND CORS/GRAFTS ANGIOGRAPHY;  Surgeon: Lennette Bihari, MD;  Location: [facility] INVASIVE CV LAB;  Service: Cardiovascular;  Laterality: N/A;   SHOULDER ARTHROSCOPY Right 2015   scar tissue   TEE WITHOUT CARDIOVERSION N/A 04/06/2015   Procedure: TRANSESOPHAGEAL ECHOCARDIOGRAM (TEE);  Surgeon: Alleen Borne, MD;  Location[facility] OR;  Service: Open Heart Surgery;  Laterality: N/A;   ULTRASOUND GUIDANCE FOR VASCULAR ACCESS  03/29/2015   Procedure: Ultrasound Guidance For Vascular Access;  Surgeon: Tonny Bollman, MD;  Location: [facility] INVASIVE CV LAB;  Service: Cardiovascular;;   UMBILICAL HERNIA REPAIR N/A 07/07/2017   Procedure: HERNIA REPAIR UMBILICAL ADULT;  Surgeon: Glenna Fellows, MD;  Location: [facility] ORS;  Service: General;  Laterality: N/A;    Current Medications: No outpatient medications have been marked as taking for the 10/21/22 encounter (Appointment) with Tonny Bollman, MD.     Allergies:   Patient has no known allergies.   Social History   Socioeconomic History   Marital status: Married    Spouse name: Not  on file   Number of children: 4   Years of education: College   Highest education level: Not on file  Occupational History    Employer: [employer]   Occupation: [employer] account executive  Tobacco Use   Smoking status: Never   Smokeless tobacco: Never  Vaping Use   Vaping status: Never Used  Substance and Sexual Activity   Alcohol use: Yes    Alcohol/week: 5.0 standard drinks of alcohol    Types: 5 Glasses of wine per week   Drug use: No   Sexual activity: Not  Currently  Other Topics Concern   Not on file  Social History Narrative   Not on file   Social Determinants of Health   Financial Resource Strain: Not on file  Food Insecurity: Not on file  Transportation Needs: Not on file  Physical Activity: Not on file  Stress: Not on file  Social Connections: Unknown (06/16/2021)   Received [facility]   Social Network    Social Network: Not on file     Family History: The patient's family history includes Bladder Cancer in his mother; COPD in his mother; Diabetes type II in his sister; Heart attack in his father; Hypertension in his father; Lupus in his sister. There is no history of Colon cancer, Pancreatic cancer, or Stomach cancer.  ROS:   Please see the history of present illness.    All other systems reviewed and are negative.  EKGs/Labs/Other Studies Reviewed:    The following studies were reviewed today: Echo: 1. Left ventricular ejection fraction, by estimation, is 60 to 65%. The  left ventricle has normal function. The left ventricle has no regional  wall motion abnormalities. Left ventricular diastolic parameters are  consistent with Grade I diastolic  dysfunction (impaired relaxation).   2. Right ventricular systolic function is normal. The right ventricular  size is normal.   3. Left atrial size was mildly dilated.   4. Right atrial size was mildly dilated.   5. The mitral valve is normal in structure. No evidence of mitral valve  regurgitation. No evidence of mitral stenosis.   6. The aortic valve is tricuspid. There is mild calcification of the  aortic valve. Aortic valve regurgitation is not visualized. No aortic  stenosis is present.   7. Aortic dilatation noted. There is mild dilatation of the ascending  aorta, measuring 42 mm.   8. The inferior vena cava is normal in size with greater than 50%  respiratory variability, suggesting right atrial pressure of 3 mmHg.       Recent  Labs: 04/07/2022: BUN 17; Creatinine, Ser 0.90; Potassium 4.3; Sodium 141  Recent Lipid Panel    Component Value Date/Time   CHOL 108 11/28/2020 0230   CHOL 108 11/16/2019 0901   TRIG 113 11/28/2020 0230   TRIG 147 02/10/2006 0856   HDL 38 (L) 11/28/2020 0230   HDL 40 11/16/2019 0901   CHOLHDL 2.8 11/28/2020 0230   VLDL 23 11/28/2020 0230   LDLCALC 47 11/28/2020 0230   LDLCALC 44 11/16/2019 0901   LDLDIRECT 68.2 10/25/2010 1015     Risk Assessment/Calculations:   {Does this patient have ATRIAL FIBRILLATION?[phone]}  No BP recorded.  {Refresh Note OR Click here to enter BP  :1}***         Physical Exam:    VS:  There were no vitals taken for this visit.    Wt Readings from Last 3 Encounters:  10/08/22 221 lb (100.2 kg)  12/24/21 265 lb 14.4 oz (120.6 kg)  10/10/21 259 lb (117.5 kg)     GEN: *** Well nourished, well developed in no acute distress HEENT: Normal NECK: No JVD; No carotid bruits LYMPHATICS: No lymphadenopathy CARDIAC: ***RRR, no murmurs, rubs, gallops RESPIRATORY:  Clear to auscultation without rales, wheezing or rhonchi  ABDOMEN: Soft, non-tender, non-distended MUSCULOSKELETAL:  No edema; No deformity  SKIN: Warm and dry NEUROLOGIC:  Alert and oriented x 3 PSYCHIATRIC:  Normal affect   ASSESSMENT:    No diagnosis found. PLAN:    In order of problems listed above:  ***      {Are you ordering a CV Procedure (e.g. stress test, cath, DCCV, TEE, etc)?   Press F2        :[id number]}    Medication Adjustments/Labs and Tests Ordered: Current medicines are reviewed at length with the patient today.  Concerns regarding medicines are outlined above.  No orders of the defined types were placed in this encounter.  No orders of the defined types were placed in this encounter.   There are no Patient Instructions on file for this visit.   Signed, Tonny Bollman, MD  10/21/2022 1:56 PM    [facility]

## 2022-10-22 ENCOUNTER — Encounter: Payer: Self-pay | Admitting: Cardiovascular Disease

## 2022-10-24 NOTE — Telephone Encounter (Signed)
Patient did not go to his recent cardiology appt.  Tried to call him to find out if was rescheduling and we got cut off.  I could not reach him after that.  Sent mychart message to find out if he is rescheduling

## 2022-10-28 ENCOUNTER — Telehealth: Payer: Self-pay

## 2022-10-28 NOTE — Telephone Encounter (Signed)
-----   Message from Yancey Flemings sent at 10/28/2022  9:55 AM EDT ----- Regarding: Plavix clearance Verlon Au, I saw your note to Mr. Favreau. I sent a message to Dr. Excell Seltzer who approved holding Plavix for 5 days and staying on aspirin throughout.  Thus, I do not believe that the patient needs a cardiologist appointment to address this issue.  I was with Mr. Bachman Friday afternoon when you got cut off.  I will let him know. Dr. Marina Goodell

## 2022-10-28 NOTE — Telephone Encounter (Signed)
Cardiologist said that patient can hold his Plavix for five days - Dr. Marina Goodell will communicate this to patient.

## 2022-11-10 ENCOUNTER — Other Ambulatory Visit: Payer: Self-pay | Admitting: Cardiovascular Disease

## 2022-11-10 DIAGNOSIS — I25118 Atherosclerotic heart disease of native coronary artery with other forms of angina pectoris: Secondary | ICD-10-CM

## 2022-11-10 DIAGNOSIS — E785 Hyperlipidemia, unspecified: Secondary | ICD-10-CM

## 2022-11-11 ENCOUNTER — Other Ambulatory Visit: Payer: Self-pay | Admitting: Cardiovascular Disease

## 2022-11-20 ENCOUNTER — Encounter: Payer: Self-pay | Admitting: Cardiovascular Disease

## 2022-11-20 ENCOUNTER — Ambulatory Visit: Payer: Commercial Managed Care - PPO | Attending: Cardiovascular Disease | Admitting: Cardiovascular Disease

## 2022-11-20 VITALS — BP 130/90 | HR 67 | Ht 70.0 in | Wt 221.8 lb

## 2022-11-20 DIAGNOSIS — E785 Hyperlipidemia, unspecified: Secondary | ICD-10-CM

## 2022-11-20 DIAGNOSIS — Z01810 Encounter for preprocedural cardiovascular examination: Secondary | ICD-10-CM

## 2022-11-20 DIAGNOSIS — I10 Essential (primary) hypertension: Secondary | ICD-10-CM | POA: Diagnosis not present

## 2022-11-20 DIAGNOSIS — I25118 Atherosclerotic heart disease of native coronary artery with other forms of angina pectoris: Secondary | ICD-10-CM

## 2022-11-20 MED ORDER — ISOSORBIDE MONONITRATE ER 30 MG PO TB24: 30.0000 mg | ORAL_TABLET | Freq: Every day | ORAL | 3 refills | Status: DC

## 2022-11-20 NOTE — Assessment & Plan Note (Signed)
The patient is stable with no symptoms of angina on his current medical regimen.  This includes aspirin and clopidogrel for antiplatelet therapy, amlodipine, isosorbide, and metoprolol for antianginal therapy, and Repatha for lipid lowering.  No changes are made today in his medical regimen.

## 2022-11-20 NOTE — Progress Notes (Signed)
severity present in the basal anterolateral location. The defect is non-reversible.  There is a large defect of moderate severity present in the basal anteroseptal, basal inferoseptal, mid anteroseptal, mid inferoseptal, apical septal and apex location. The defect is partially reversible and is consistent with infarct with peri- infarct ischemia. There is significant extracardiac activity and diaphragmatic attenuation as well. In the setting of LV dysfunction favor ischemia.   ECHOCARDIOGRAM  ECHOCARDIOGRAM COMPLETE 11/28/2020  Narrative ECHOCARDIOGRAM REPORT    Patient Name:   Leonard Edwards Date of Exam: 11/28/2020 Medical Rec #:  [id number]      Height:       70.5 in Accession #:    [id number]     Weight:       241.4 lb Date of Birth:  1955-09-12      BSA:          2.273 m Patient Age:    67 years       BP:           145/69 mmHg Patient Gender: M              HR:           54 bpm. Exam Location:  Inpatient  Procedure: 2D Echo, Color Doppler and Cardiac Doppler  Indications:    Chest pain  History:        Patient has no prior history of Echocardiogram examinations. Risk Factors:Family History of Coronary Artery Disease and Hypertension.  Sonographer:     Alvera Novel Referring Phys: [id number] ANGELA NICOLE DUKE  IMPRESSIONS   1. Left ventricular ejection fraction, by estimation, is 60 to 65%. The left ventricle has normal function. The left ventricle has no regional wall motion abnormalities. Left ventricular diastolic parameters are consistent with Grade I diastolic dysfunction (impaired relaxation). 2. Right ventricular systolic function is normal. The right ventricular size is normal. 3. Left atrial size was mildly dilated. 4. Right atrial size was mildly dilated. 5. The mitral valve is normal in structure. No evidence of mitral valve regurgitation. No evidence of mitral stenosis. 6. The aortic valve is tricuspid. There is mild calcification of the aortic valve. Aortic valve regurgitation is not visualized. No aortic stenosis is present. 7. Aortic dilatation noted. There is mild dilatation of the ascending aorta, measuring 42 mm. 8. The inferior vena cava is normal in size with greater than 50% respiratory variability, suggesting right atrial pressure of 3 mmHg.  FINDINGS Left Ventricle: Left ventricular ejection fraction, by estimation, is 60 to 65%. The left ventricle has normal function. The left ventricle has no regional wall motion abnormalities. Definity contrast agent was given IV to delineate the left ventricular endocardial borders. The left ventricular internal cavity size was normal in size. There is no left ventricular hypertrophy. Left ventricular diastolic parameters are consistent with Grade I diastolic dysfunction (impaired relaxation).  Right Ventricle: The right ventricular size is normal. No increase in right ventricular wall thickness. Right ventricular systolic function is normal.  Left Atrium: Left atrial size was mildly dilated.  Right Atrium: Right atrial size was mildly dilated.  Pericardium: There is no evidence of pericardial effusion.  Mitral Valve: The mitral valve is normal in structure. There is mild  calcification of the mitral valve leaflet(s). No evidence of mitral valve regurgitation. No evidence of mitral valve stenosis.  Tricuspid Valve: The tricuspid valve is normal in structure. Tricuspid valve regurgitation is trivial. No evidence of tricuspid stenosis.  Aortic Valve: The aortic valve is tricuspid. There  Cardiology Office Note:    Date:  11/20/2022   ID:  Leonard Edwards, DOB October 16, 1955, MRN [id number]  PCP:  Cleatis Polka., MD   [facility] Providers Cardiologist:  Tonny Bollman, MD     Referring MD: Cleatis Polka., MD   Chief Complaint  Patient presents with   Coronary Artery Disease    History of Present Illness:    Leonard Edwards is a 67 y.o. male presenting for follow-up of coronary artery disease.  The patient has a history of multiple PCI procedures ultimately treated with multivessel CABG in 2017.  Comorbid conditions include hypertension, mixed hyperlipidemia, obesity, obstructive sleep apnea, and gout.  The patient is here alone today.  He is feeling great.  He has lost over 30 pounds.  He is exercising regularly with no exertional symptoms.  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  He denies chest pain, chest pressure, or shortness of breath.  He states that his blood pressure is running good at home and at other office visits.  He is compliant with his medications.  Current Medications: Current Meds  Medication Sig   amLODipine (NORVASC) 10 MG tablet TAKE 1 TABLET(10 MG) BY MOUTH DAILY   aspirin EC 81 MG tablet Take 81 mg by mouth daily.   clopidogrel (PLAVIX) 75 MG tablet TAKE 1 TABLET(75 MG) BY MOUTH DAILY WITH BREAKFAST   Coenzyme Q10 (CO Q 10) 100 MG CAPS Take 100 mg by mouth daily.   fluticasone (FLONASE) 50 MCG/ACT nasal spray Place 2 sprays into both nostrils daily as needed for allergies or rhinitis.   hydrochlorothiazide (HYDRODIURIL) 25 MG tablet TAKE 1 TABLET(25 MG) BY MOUTH DAILY   indomethacin (INDOCIN) 50 MG capsule Take 1 capsule (50 mg total) by mouth 3 (three) times daily with meals.   levocetirizine (XYZAL) 5 MG tablet Take 5 mg by mouth daily as needed for allergies.   lisinopril (ZESTRIL) 20 MG tablet TAKE 1 TABLET(20 MG) BY MOUTH DAILY   metoprolol succinate (TOPROL-XL) 50 MG 24 hr tablet TAKE 1 TABLET(50 MG) BY MOUTH DAILY WITH  OR IMMEDIATELY FOLLOWING A MEAL   montelukast (SINGULAIR) 10 MG tablet Take 10 mg by mouth daily.   Na Sulfate-K Sulfate-Mg Sulf 17.5-3.13-1.6 GM/177ML SOLN SMARTSIG:1 Kit(s) By Mouth Once   pantoprazole (PROTONIX) 40 MG tablet Take 1 tablet (40 mg total) by mouth daily as needed.   REPATHA SURECLICK 140 MG/ML SOAJ ADMINISTER 1 ML UNDER THE SKIN EVERY 14 DAYS   tirzepatide (ZEPBOUND) 15 MG/0.5ML Pen Inject 15 mg into the skin once a week.   [DISCONTINUED] allopurinol (ZYLOPRIM) 300 MG tablet Take 300 mg by mouth daily.   [DISCONTINUED] amoxicillin (AMOXIL) 500 MG capsule Take 1 capsule (500 mg total) by mouth 2 (two) times daily.   [DISCONTINUED] colchicine 0.6 MG tablet Take 0.6 mg by mouth daily as needed (gout flare up).   [DISCONTINUED] isosorbide mononitrate (IMDUR) 30 MG 24 hr tablet TAKE 1 TABLET(30 MG) BY MOUTH DAILY     Allergies:   Patient has no known allergies.   ROS:   Please see the history of present illness.    All other systems reviewed and are negative.  EKGs/Labs/Other Studies Reviewed:    The following studies were reviewed today: Cardiac Studies & Procedures   CARDIAC CATHETERIZATION  CARDIAC CATHETERIZATION 11/28/2020  Narrative   Mid LM to Dist LM lesion is 95% stenosed.   Dist LM to Ost LAD lesion is 95% stenosed.   Suezanne Jacquet  severity present in the basal anterolateral location. The defect is non-reversible.  There is a large defect of moderate severity present in the basal anteroseptal, basal inferoseptal, mid anteroseptal, mid inferoseptal, apical septal and apex location. The defect is partially reversible and is consistent with infarct with peri- infarct ischemia. There is significant extracardiac activity and diaphragmatic attenuation as well. In the setting of LV dysfunction favor ischemia.   ECHOCARDIOGRAM  ECHOCARDIOGRAM COMPLETE 11/28/2020  Narrative ECHOCARDIOGRAM REPORT    Patient Name:   Leonard Edwards Date of Exam: 11/28/2020 Medical Rec #:  [id number]      Height:       70.5 in Accession #:    [id number]     Weight:       241.4 lb Date of Birth:  1955-09-12      BSA:          2.273 m Patient Age:    67 years       BP:           145/69 mmHg Patient Gender: M              HR:           54 bpm. Exam Location:  Inpatient  Procedure: 2D Echo, Color Doppler and Cardiac Doppler  Indications:    Chest pain  History:        Patient has no prior history of Echocardiogram examinations. Risk Factors:Family History of Coronary Artery Disease and Hypertension.  Sonographer:     Alvera Novel Referring Phys: [id number] ANGELA NICOLE DUKE  IMPRESSIONS   1. Left ventricular ejection fraction, by estimation, is 60 to 65%. The left ventricle has normal function. The left ventricle has no regional wall motion abnormalities. Left ventricular diastolic parameters are consistent with Grade I diastolic dysfunction (impaired relaxation). 2. Right ventricular systolic function is normal. The right ventricular size is normal. 3. Left atrial size was mildly dilated. 4. Right atrial size was mildly dilated. 5. The mitral valve is normal in structure. No evidence of mitral valve regurgitation. No evidence of mitral stenosis. 6. The aortic valve is tricuspid. There is mild calcification of the aortic valve. Aortic valve regurgitation is not visualized. No aortic stenosis is present. 7. Aortic dilatation noted. There is mild dilatation of the ascending aorta, measuring 42 mm. 8. The inferior vena cava is normal in size with greater than 50% respiratory variability, suggesting right atrial pressure of 3 mmHg.  FINDINGS Left Ventricle: Left ventricular ejection fraction, by estimation, is 60 to 65%. The left ventricle has normal function. The left ventricle has no regional wall motion abnormalities. Definity contrast agent was given IV to delineate the left ventricular endocardial borders. The left ventricular internal cavity size was normal in size. There is no left ventricular hypertrophy. Left ventricular diastolic parameters are consistent with Grade I diastolic dysfunction (impaired relaxation).  Right Ventricle: The right ventricular size is normal. No increase in right ventricular wall thickness. Right ventricular systolic function is normal.  Left Atrium: Left atrial size was mildly dilated.  Right Atrium: Right atrial size was mildly dilated.  Pericardium: There is no evidence of pericardial effusion.  Mitral Valve: The mitral valve is normal in structure. There is mild  calcification of the mitral valve leaflet(s). No evidence of mitral valve regurgitation. No evidence of mitral valve stenosis.  Tricuspid Valve: The tricuspid valve is normal in structure. Tricuspid valve regurgitation is trivial. No evidence of tricuspid stenosis.  Aortic Valve: The aortic valve is tricuspid. There  Cardiology Office Note:    Date:  11/20/2022   ID:  Leonard Edwards, DOB October 16, 1955, MRN [id number]  PCP:  Cleatis Polka., MD   [facility] Providers Cardiologist:  Tonny Bollman, MD     Referring MD: Cleatis Polka., MD   Chief Complaint  Patient presents with   Coronary Artery Disease    History of Present Illness:    Leonard Edwards is a 67 y.o. male presenting for follow-up of coronary artery disease.  The patient has a history of multiple PCI procedures ultimately treated with multivessel CABG in 2017.  Comorbid conditions include hypertension, mixed hyperlipidemia, obesity, obstructive sleep apnea, and gout.  The patient is here alone today.  He is feeling great.  He has lost over 30 pounds.  He is exercising regularly with no exertional symptoms.  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  He denies chest pain, chest pressure, or shortness of breath.  He states that his blood pressure is running good at home and at other office visits.  He is compliant with his medications.  Current Medications: Current Meds  Medication Sig   amLODipine (NORVASC) 10 MG tablet TAKE 1 TABLET(10 MG) BY MOUTH DAILY   aspirin EC 81 MG tablet Take 81 mg by mouth daily.   clopidogrel (PLAVIX) 75 MG tablet TAKE 1 TABLET(75 MG) BY MOUTH DAILY WITH BREAKFAST   Coenzyme Q10 (CO Q 10) 100 MG CAPS Take 100 mg by mouth daily.   fluticasone (FLONASE) 50 MCG/ACT nasal spray Place 2 sprays into both nostrils daily as needed for allergies or rhinitis.   hydrochlorothiazide (HYDRODIURIL) 25 MG tablet TAKE 1 TABLET(25 MG) BY MOUTH DAILY   indomethacin (INDOCIN) 50 MG capsule Take 1 capsule (50 mg total) by mouth 3 (three) times daily with meals.   levocetirizine (XYZAL) 5 MG tablet Take 5 mg by mouth daily as needed for allergies.   lisinopril (ZESTRIL) 20 MG tablet TAKE 1 TABLET(20 MG) BY MOUTH DAILY   metoprolol succinate (TOPROL-XL) 50 MG 24 hr tablet TAKE 1 TABLET(50 MG) BY MOUTH DAILY WITH  OR IMMEDIATELY FOLLOWING A MEAL   montelukast (SINGULAIR) 10 MG tablet Take 10 mg by mouth daily.   Na Sulfate-K Sulfate-Mg Sulf 17.5-3.13-1.6 GM/177ML SOLN SMARTSIG:1 Kit(s) By Mouth Once   pantoprazole (PROTONIX) 40 MG tablet Take 1 tablet (40 mg total) by mouth daily as needed.   REPATHA SURECLICK 140 MG/ML SOAJ ADMINISTER 1 ML UNDER THE SKIN EVERY 14 DAYS   tirzepatide (ZEPBOUND) 15 MG/0.5ML Pen Inject 15 mg into the skin once a week.   [DISCONTINUED] allopurinol (ZYLOPRIM) 300 MG tablet Take 300 mg by mouth daily.   [DISCONTINUED] amoxicillin (AMOXIL) 500 MG capsule Take 1 capsule (500 mg total) by mouth 2 (two) times daily.   [DISCONTINUED] colchicine 0.6 MG tablet Take 0.6 mg by mouth daily as needed (gout flare up).   [DISCONTINUED] isosorbide mononitrate (IMDUR) 30 MG 24 hr tablet TAKE 1 TABLET(30 MG) BY MOUTH DAILY     Allergies:   Patient has no known allergies.   ROS:   Please see the history of present illness.    All other systems reviewed and are negative.  EKGs/Labs/Other Studies Reviewed:    The following studies were reviewed today: Cardiac Studies & Procedures   CARDIAC CATHETERIZATION  CARDIAC CATHETERIZATION 11/28/2020  Narrative   Mid LM to Dist LM lesion is 95% stenosed.   Dist LM to Ost LAD lesion is 95% stenosed.   Suezanne Jacquet  Cardiology Office Note:    Date:  11/20/2022   ID:  Leonard Edwards, DOB October 16, 1955, MRN [id number]  PCP:  Cleatis Polka., MD   [facility] Providers Cardiologist:  Tonny Bollman, MD     Referring MD: Cleatis Polka., MD   Chief Complaint  Patient presents with   Coronary Artery Disease    History of Present Illness:    Leonard Edwards is a 67 y.o. male presenting for follow-up of coronary artery disease.  The patient has a history of multiple PCI procedures ultimately treated with multivessel CABG in 2017.  Comorbid conditions include hypertension, mixed hyperlipidemia, obesity, obstructive sleep apnea, and gout.  The patient is here alone today.  He is feeling great.  He has lost over 30 pounds.  He is exercising regularly with no exertional symptoms.  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  He denies chest pain, chest pressure, or shortness of breath.  He states that his blood pressure is running good at home and at other office visits.  He is compliant with his medications.  Current Medications: Current Meds  Medication Sig   amLODipine (NORVASC) 10 MG tablet TAKE 1 TABLET(10 MG) BY MOUTH DAILY   aspirin EC 81 MG tablet Take 81 mg by mouth daily.   clopidogrel (PLAVIX) 75 MG tablet TAKE 1 TABLET(75 MG) BY MOUTH DAILY WITH BREAKFAST   Coenzyme Q10 (CO Q 10) 100 MG CAPS Take 100 mg by mouth daily.   fluticasone (FLONASE) 50 MCG/ACT nasal spray Place 2 sprays into both nostrils daily as needed for allergies or rhinitis.   hydrochlorothiazide (HYDRODIURIL) 25 MG tablet TAKE 1 TABLET(25 MG) BY MOUTH DAILY   indomethacin (INDOCIN) 50 MG capsule Take 1 capsule (50 mg total) by mouth 3 (three) times daily with meals.   levocetirizine (XYZAL) 5 MG tablet Take 5 mg by mouth daily as needed for allergies.   lisinopril (ZESTRIL) 20 MG tablet TAKE 1 TABLET(20 MG) BY MOUTH DAILY   metoprolol succinate (TOPROL-XL) 50 MG 24 hr tablet TAKE 1 TABLET(50 MG) BY MOUTH DAILY WITH  OR IMMEDIATELY FOLLOWING A MEAL   montelukast (SINGULAIR) 10 MG tablet Take 10 mg by mouth daily.   Na Sulfate-K Sulfate-Mg Sulf 17.5-3.13-1.6 GM/177ML SOLN SMARTSIG:1 Kit(s) By Mouth Once   pantoprazole (PROTONIX) 40 MG tablet Take 1 tablet (40 mg total) by mouth daily as needed.   REPATHA SURECLICK 140 MG/ML SOAJ ADMINISTER 1 ML UNDER THE SKIN EVERY 14 DAYS   tirzepatide (ZEPBOUND) 15 MG/0.5ML Pen Inject 15 mg into the skin once a week.   [DISCONTINUED] allopurinol (ZYLOPRIM) 300 MG tablet Take 300 mg by mouth daily.   [DISCONTINUED] amoxicillin (AMOXIL) 500 MG capsule Take 1 capsule (500 mg total) by mouth 2 (two) times daily.   [DISCONTINUED] colchicine 0.6 MG tablet Take 0.6 mg by mouth daily as needed (gout flare up).   [DISCONTINUED] isosorbide mononitrate (IMDUR) 30 MG 24 hr tablet TAKE 1 TABLET(30 MG) BY MOUTH DAILY     Allergies:   Patient has no known allergies.   ROS:   Please see the history of present illness.    All other systems reviewed and are negative.  EKGs/Labs/Other Studies Reviewed:    The following studies were reviewed today: Cardiac Studies & Procedures   CARDIAC CATHETERIZATION  CARDIAC CATHETERIZATION 11/28/2020  Narrative   Mid LM to Dist LM lesion is 95% stenosed.   Dist LM to Ost LAD lesion is 95% stenosed.   Suezanne Jacquet  severity present in the basal anterolateral location. The defect is non-reversible.  There is a large defect of moderate severity present in the basal anteroseptal, basal inferoseptal, mid anteroseptal, mid inferoseptal, apical septal and apex location. The defect is partially reversible and is consistent with infarct with peri- infarct ischemia. There is significant extracardiac activity and diaphragmatic attenuation as well. In the setting of LV dysfunction favor ischemia.   ECHOCARDIOGRAM  ECHOCARDIOGRAM COMPLETE 11/28/2020  Narrative ECHOCARDIOGRAM REPORT    Patient Name:   Leonard Edwards Date of Exam: 11/28/2020 Medical Rec #:  [id number]      Height:       70.5 in Accession #:    [id number]     Weight:       241.4 lb Date of Birth:  1955-09-12      BSA:          2.273 m Patient Age:    67 years       BP:           145/69 mmHg Patient Gender: M              HR:           54 bpm. Exam Location:  Inpatient  Procedure: 2D Echo, Color Doppler and Cardiac Doppler  Indications:    Chest pain  History:        Patient has no prior history of Echocardiogram examinations. Risk Factors:Family History of Coronary Artery Disease and Hypertension.  Sonographer:     Alvera Novel Referring Phys: [id number] ANGELA NICOLE DUKE  IMPRESSIONS   1. Left ventricular ejection fraction, by estimation, is 60 to 65%. The left ventricle has normal function. The left ventricle has no regional wall motion abnormalities. Left ventricular diastolic parameters are consistent with Grade I diastolic dysfunction (impaired relaxation). 2. Right ventricular systolic function is normal. The right ventricular size is normal. 3. Left atrial size was mildly dilated. 4. Right atrial size was mildly dilated. 5. The mitral valve is normal in structure. No evidence of mitral valve regurgitation. No evidence of mitral stenosis. 6. The aortic valve is tricuspid. There is mild calcification of the aortic valve. Aortic valve regurgitation is not visualized. No aortic stenosis is present. 7. Aortic dilatation noted. There is mild dilatation of the ascending aorta, measuring 42 mm. 8. The inferior vena cava is normal in size with greater than 50% respiratory variability, suggesting right atrial pressure of 3 mmHg.  FINDINGS Left Ventricle: Left ventricular ejection fraction, by estimation, is 60 to 65%. The left ventricle has normal function. The left ventricle has no regional wall motion abnormalities. Definity contrast agent was given IV to delineate the left ventricular endocardial borders. The left ventricular internal cavity size was normal in size. There is no left ventricular hypertrophy. Left ventricular diastolic parameters are consistent with Grade I diastolic dysfunction (impaired relaxation).  Right Ventricle: The right ventricular size is normal. No increase in right ventricular wall thickness. Right ventricular systolic function is normal.  Left Atrium: Left atrial size was mildly dilated.  Right Atrium: Right atrial size was mildly dilated.  Pericardium: There is no evidence of pericardial effusion.  Mitral Valve: The mitral valve is normal in structure. There is mild  calcification of the mitral valve leaflet(s). No evidence of mitral valve regurgitation. No evidence of mitral valve stenosis.  Tricuspid Valve: The tricuspid valve is normal in structure. Tricuspid valve regurgitation is trivial. No evidence of tricuspid stenosis.  Aortic Valve: The aortic valve is tricuspid. There  Cardiology Office Note:    Date:  11/20/2022   ID:  Leonard Edwards, DOB October 16, 1955, MRN [id number]  PCP:  Cleatis Polka., MD   [facility] Providers Cardiologist:  Tonny Bollman, MD     Referring MD: Cleatis Polka., MD   Chief Complaint  Patient presents with   Coronary Artery Disease    History of Present Illness:    Leonard Edwards is a 67 y.o. male presenting for follow-up of coronary artery disease.  The patient has a history of multiple PCI procedures ultimately treated with multivessel CABG in 2017.  Comorbid conditions include hypertension, mixed hyperlipidemia, obesity, obstructive sleep apnea, and gout.  The patient is here alone today.  He is feeling great.  He has lost over 30 pounds.  He is exercising regularly with no exertional symptoms.  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  He denies chest pain, chest pressure, or shortness of breath.  He states that his blood pressure is running good at home and at other office visits.  He is compliant with his medications.  Current Medications: Current Meds  Medication Sig   amLODipine (NORVASC) 10 MG tablet TAKE 1 TABLET(10 MG) BY MOUTH DAILY   aspirin EC 81 MG tablet Take 81 mg by mouth daily.   clopidogrel (PLAVIX) 75 MG tablet TAKE 1 TABLET(75 MG) BY MOUTH DAILY WITH BREAKFAST   Coenzyme Q10 (CO Q 10) 100 MG CAPS Take 100 mg by mouth daily.   fluticasone (FLONASE) 50 MCG/ACT nasal spray Place 2 sprays into both nostrils daily as needed for allergies or rhinitis.   hydrochlorothiazide (HYDRODIURIL) 25 MG tablet TAKE 1 TABLET(25 MG) BY MOUTH DAILY   indomethacin (INDOCIN) 50 MG capsule Take 1 capsule (50 mg total) by mouth 3 (three) times daily with meals.   levocetirizine (XYZAL) 5 MG tablet Take 5 mg by mouth daily as needed for allergies.   lisinopril (ZESTRIL) 20 MG tablet TAKE 1 TABLET(20 MG) BY MOUTH DAILY   metoprolol succinate (TOPROL-XL) 50 MG 24 hr tablet TAKE 1 TABLET(50 MG) BY MOUTH DAILY WITH  OR IMMEDIATELY FOLLOWING A MEAL   montelukast (SINGULAIR) 10 MG tablet Take 10 mg by mouth daily.   Na Sulfate-K Sulfate-Mg Sulf 17.5-3.13-1.6 GM/177ML SOLN SMARTSIG:1 Kit(s) By Mouth Once   pantoprazole (PROTONIX) 40 MG tablet Take 1 tablet (40 mg total) by mouth daily as needed.   REPATHA SURECLICK 140 MG/ML SOAJ ADMINISTER 1 ML UNDER THE SKIN EVERY 14 DAYS   tirzepatide (ZEPBOUND) 15 MG/0.5ML Pen Inject 15 mg into the skin once a week.   [DISCONTINUED] allopurinol (ZYLOPRIM) 300 MG tablet Take 300 mg by mouth daily.   [DISCONTINUED] amoxicillin (AMOXIL) 500 MG capsule Take 1 capsule (500 mg total) by mouth 2 (two) times daily.   [DISCONTINUED] colchicine 0.6 MG tablet Take 0.6 mg by mouth daily as needed (gout flare up).   [DISCONTINUED] isosorbide mononitrate (IMDUR) 30 MG 24 hr tablet TAKE 1 TABLET(30 MG) BY MOUTH DAILY     Allergies:   Patient has no known allergies.   ROS:   Please see the history of present illness.    All other systems reviewed and are negative.  EKGs/Labs/Other Studies Reviewed:    The following studies were reviewed today: Cardiac Studies & Procedures   CARDIAC CATHETERIZATION  CARDIAC CATHETERIZATION 11/28/2020  Narrative   Mid LM to Dist LM lesion is 95% stenosed.   Dist LM to Ost LAD lesion is 95% stenosed.   Suezanne Jacquet

## 2022-11-20 NOTE — Patient Instructions (Addendum)
Medication Instructions:  Your physician recommends that you continue on your current medications as directed. Please refer to the Current Medication list given to you today. *If you need a refill on your cardiac medications before your next appointment, please call your pharmacy*  Testing/Procedures: **Cleared for upcoming surgery**  Follow-Up: At [facility], you and your health needs are our priority.  As part of our continuing mission to provide you with exceptional heart care, we have created designated Provider Care Teams.  These Care Teams include your primary Cardiologist (physician) and Advanced Practice Providers (APPs -  Physician Assistants and Nurse Practitioners) who all work together to provide you with the care you need, when you need it.  Your next appointment:   1 year(s)  Provider:   Tonny Bollman, MD      Other Instructions **Please check BP twice weekly and record. Let us know if trending higher than 130/90 consistently**

## 2022-11-20 NOTE — Assessment & Plan Note (Signed)
Per patient report, blood pressure is well-controlled on home readings and at other office visits.  Last blood pressure on file from October 08, 2022 is 124/80.

## 2022-11-25 ENCOUNTER — Telehealth: Payer: Self-pay

## 2022-11-25 NOTE — Telephone Encounter (Signed)
Appt cancelled and [facility] notified.

## 2022-11-25 NOTE — Telephone Encounter (Signed)
-----   Message from Yancey Flemings sent at 11/25/2022  4:23 PM EDT ----- Regarding: Canceling procedure Bonita Quin, Mr. Giglia reached out to me.  He needs to cancel tomorrow's procedure 1:30.  He will reschedule at some point the future. Please take him off my list. Please let the [facility] know. Thanks, Dr. Marina Goodell

## 2022-11-26 ENCOUNTER — Encounter: Payer: Commercial Managed Care - PPO | Admitting: Internal Medicine

## 2022-12-03 ENCOUNTER — Telehealth: Payer: Self-pay | Admitting: Pharmacy Technician

## 2022-12-03 ENCOUNTER — Other Ambulatory Visit (HOSPITAL_COMMUNITY): Payer: Self-pay

## 2022-12-03 NOTE — Telephone Encounter (Signed)
Pharmacy Patient Advocate Encounter  Received notification from [facility] that Prior Authorization for zepbound has been APPROVED from 12/03/22 to 06/03/23   PA #/Case ID/Reference #: [id number]

## 2022-12-03 NOTE — Telephone Encounter (Signed)
Pharmacy Patient Advocate Encounter   Received notification from CoverMyMeds that prior authorization for zepbound is required/requested.   Insurance verification completed.   The patient is insured through [facility] .   Per test claim: PA required; PA submitted to above mentioned insurance via CoverMyMeds Key/confirmation #/EOC [id number] Status is pending

## 2022-12-09 ENCOUNTER — Other Ambulatory Visit: Payer: Self-pay | Admitting: Cardiovascular Disease

## 2022-12-29 ENCOUNTER — Other Ambulatory Visit: Payer: Self-pay | Admitting: Cardiovascular Disease

## 2022-12-29 DIAGNOSIS — I25118 Atherosclerotic heart disease of native coronary artery with other forms of angina pectoris: Secondary | ICD-10-CM

## 2023-01-05 MED ORDER — ZEPBOUND 15 MG/0.5ML ~~LOC~~ SOAJ: 15.0000 mg | SUBCUTANEOUS | 3 refills | Status: DC

## 2023-01-05 NOTE — Addendum Note (Signed)
Addended by: Malena Peer D on: 01/05/2023 04:08 PM   Modules accepted: Orders

## 2023-02-09 ENCOUNTER — Other Ambulatory Visit: Payer: Self-pay | Admitting: Cardiovascular Disease

## 2023-03-02 ENCOUNTER — Other Ambulatory Visit: Payer: Self-pay | Admitting: Cardiovascular Disease

## 2023-03-30 ENCOUNTER — Other Ambulatory Visit: Payer: Self-pay

## 2023-03-30 ENCOUNTER — Telehealth: Payer: Self-pay

## 2023-03-30 DIAGNOSIS — J321 Chronic frontal sinusitis: Secondary | ICD-10-CM

## 2023-03-30 MED ORDER — AMOXICILLIN 500 MG PO CAPS: 500.0000 mg | ORAL_CAPSULE | Freq: Two times a day (BID) | ORAL | 1 refills | Status: AC

## 2023-03-30 NOTE — Telephone Encounter (Signed)
-----   Message from Yancey Flemings sent at 03/30/2023  9:52 AM EST ----- Regarding: Amoxicillin Viviann Spare, Mr. Zaremba called me with an acute flare of bronchitis/sinusitis. Please refill his amoxicillin prescription as previous.  You can add 1 additional refill. Thanks, Dr. Marina Goodell

## 2023-03-30 NOTE — Telephone Encounter (Signed)
 Pt made aware of Dr. Henrene Pastor recommendations: Prescription sent to pharmacy: Pt made aware: Pt verbalized understanding with all questions answered.

## 2023-04-01 ENCOUNTER — Other Ambulatory Visit: Payer: Self-pay | Admitting: Cardiovascular Disease

## 2023-05-01 ENCOUNTER — Encounter: Payer: Self-pay | Admitting: Pharmacy Technician

## 2023-05-01 ENCOUNTER — Telehealth: Payer: Self-pay | Admitting: Pharmacy Technician

## 2023-05-01 NOTE — Telephone Encounter (Signed)
 Pharmacy Patient Advocate Encounter   Received notification from CoverMyMeds that prior authorization for zepbound is required/requested.   Insurance verification completed.   The patient is insured through [facility] .   Per test claim: PA required; PA started via CoverMyMeds. KEY [id number] . Please see clinical question(s) below that I am not finding the answer to in her chart and advise.   Does the patient meet ONE of the following: (1) Patient has been receiving Zepbound therapy for up to 6 months and has had a weight loss of greater than or equal to 5% of baseline body weight, (2) Patient has been receiving Zepbound therapy for greater than 6 months and is continuing to experience or maintain weight loss?  I sent the patient a mychart message to see if he has

## 2023-05-04 NOTE — Telephone Encounter (Signed)
 I called the patient since he has not read the mychart message yet

## 2023-05-05 NOTE — Telephone Encounter (Signed)
 I called the patient again to follow up

## 2023-05-13 ENCOUNTER — Other Ambulatory Visit (HOSPITAL_COMMUNITY): Payer: Self-pay

## 2023-05-13 NOTE — Telephone Encounter (Addendum)
 Current weight 217 as of 05/13/23

## 2023-05-13 NOTE — Telephone Encounter (Signed)
 Pharmacy Patient Advocate Encounter  Received notification from [facility] that Prior Authorization for zepbound has been APPROVED from 05/13/23 to 05/12/24. Spoke to pharmacy to process.Copay is $1,025.00 because of deductible for 30 days.    I called the patient and left message so I could tell him about the copay and Savings Card, Cost & Coverage Support  Zepbound (tirzepatide)

## 2023-05-13 NOTE — Telephone Encounter (Signed)
 Pharmacy Patient Advocate Encounter   Received notification from CoverMyMeds that prior authorization for zepbound is required/requested.   Insurance verification completed.   The patient is insured through [facility] .   Per test claim: PA required; PA submitted to above mentioned insurance via CoverMyMeds Key/confirmation #/EOC [id number] Status is pending   Patient has new insurance this time

## 2023-06-22 ENCOUNTER — Other Ambulatory Visit: Payer: Self-pay | Admitting: Internal Medicine

## 2023-07-09 ENCOUNTER — Other Ambulatory Visit: Payer: Self-pay | Admitting: Cardiovascular Disease

## 2023-08-13 ENCOUNTER — Other Ambulatory Visit (HOSPITAL_COMMUNITY): Payer: Self-pay

## 2023-08-13 ENCOUNTER — Telehealth: Payer: Self-pay | Admitting: Pharmacy Technician

## 2023-08-13 NOTE — Telephone Encounter (Signed)
 Message to Dr.Cooper for advice.

## 2023-08-13 NOTE — Telephone Encounter (Addendum)
 Insurance is no longer covering zepbound ; however, wegovy  is preferred. Wegovy  is 255.47 for 30 days on insurance. Can he be changed to wegovy ?

## 2023-08-17 NOTE — Telephone Encounter (Signed)
 There is no issue with changing to Wegovy . Would change him to [facility] 2.4mg  weekly.

## 2023-08-24 NOTE — Telephone Encounter (Signed)
 See notes. Please send in Rx for Wegovy . See Melissa's recommendations on dosing. thanks

## 2023-08-25 NOTE — Telephone Encounter (Signed)
 Spoke with patient and shared information below regarding Zepbound  not being covered by his insurance and Wegovy  being preferred.  Patient expressed appreciation for call and states he will call his insurance company to discuss this and will then call us  back.

## 2023-09-22 ENCOUNTER — Other Ambulatory Visit: Payer: Self-pay | Admitting: Internal Medicine

## 2023-09-29 ENCOUNTER — Other Ambulatory Visit: Payer: Self-pay | Admitting: Cardiovascular Disease

## 2023-10-21 ENCOUNTER — Telehealth: Payer: Self-pay | Admitting: Cardiovascular Disease

## 2023-10-21 NOTE — Telephone Encounter (Signed)
 Patient wants to continue on zepbound  as he feels it is a better medication to treat his triglycerides .He asks if there is a manufacturers coupon he can use.

## 2023-10-21 NOTE — Telephone Encounter (Signed)
 Pt c/o medication issue:  1. Name of Medication: tirzepatide  (ZEPBOUND ) 15 MG/0.5ML Pen   2. How are you currently taking this medication (dosage and times per day)?    3. Are you having a reaction (difficulty breathing--STAT)? no  4. What is your medication issue? Patient calling to speak to the nurse about medication. Please advise

## 2023-10-22 ENCOUNTER — Telehealth: Payer: Self-pay | Admitting: Pharmacy Technician

## 2023-10-22 NOTE — Telephone Encounter (Signed)
   I called [facility] and they said he is still getting zepbound  but on a coupon so we will get this request probably every time since they can't stop the request coming. But no need to call since they know to just run zepbound  on the coupon card he has

## 2023-10-23 NOTE — Telephone Encounter (Signed)
 Left message for patient to call back

## 2023-10-23 NOTE — Telephone Encounter (Signed)
 It looks like from Leonard Edwards note from yesterday that he already has a coupon on file. Should be using coupon that brings cost to $650/month  The only other option is to do the vial program where he is shipped a vial of medication for $500 and he would draw medication out with syringe and inject.

## 2023-11-05 ENCOUNTER — Other Ambulatory Visit: Payer: Self-pay | Admitting: Cardiovascular Disease

## 2023-11-17 ENCOUNTER — Other Ambulatory Visit: Payer: Self-pay | Admitting: Cardiovascular Disease

## 2023-11-19 ENCOUNTER — Other Ambulatory Visit: Payer: Self-pay | Admitting: Cardiovascular Disease

## 2023-12-07 ENCOUNTER — Other Ambulatory Visit: Payer: Self-pay | Admitting: Cardiovascular Disease

## 2023-12-07 DIAGNOSIS — E785 Hyperlipidemia, unspecified: Secondary | ICD-10-CM

## 2023-12-07 DIAGNOSIS — I25118 Atherosclerotic heart disease of native coronary artery with other forms of angina pectoris: Secondary | ICD-10-CM

## 2023-12-08 ENCOUNTER — Other Ambulatory Visit: Payer: Self-pay | Admitting: Cardiovascular Disease

## 2023-12-10 ENCOUNTER — Other Ambulatory Visit: Payer: Self-pay | Admitting: Cardiovascular Disease

## 2023-12-23 ENCOUNTER — Other Ambulatory Visit: Payer: Self-pay | Admitting: Internal Medicine

## 2023-12-23 ENCOUNTER — Other Ambulatory Visit: Payer: Self-pay | Admitting: Cardiovascular Disease

## 2023-12-24 ENCOUNTER — Other Ambulatory Visit: Payer: Self-pay | Admitting: Cardiovascular Disease

## 2023-12-25 MED ORDER — CLOPIDOGREL BISULFATE 75 MG PO TABS: 75.0000 mg | ORAL_TABLET | Freq: Every day | ORAL | 0 refills | Status: AC

## 2024-01-08 ENCOUNTER — Other Ambulatory Visit: Payer: Self-pay | Admitting: Cardiovascular Disease

## 2024-01-18 ENCOUNTER — Telehealth: Payer: Self-pay | Admitting: Pharmacist

## 2024-01-18 NOTE — Telephone Encounter (Signed)
°  Pt c/o medication issue:  1. Name of Medication:   tirzepatide  (ZEPBOUND ) 15 MG/0.5ML Pen    2. How are you currently taking this medication (dosage and times per day)?   Inject 15 mg into the skin once a week.    3. Are you having a reaction (difficulty breathing--STAT)? No   4. What is your medication issue? Patient requesting to speak with Pharmacist regarding this medication. He said, the premiums and coupons had change and he would like to discuss that.

## 2024-01-19 MED ORDER — ZEPBOUND 7.5 MG/0.5ML ~~LOC~~ SOAJ: 7.5000 mg | SUBCUTANEOUS | 0 refills | Status: DC

## 2024-01-19 NOTE — Telephone Encounter (Signed)
 I contacted the patient, who stated that he has not been on Zepbound  for the past three weeks due to needing a prescription. He expressed interest in restarting at a lower dose and staying on the lower dose as he has been doing well. He has lost weight previously, is swimming four times per week, and is doing well overall. He is maintaining progress but would prefer to be maintained on 7.5 mg or 10 mg. We agreed on a conservative approach and will restart at 7.5 mg. A prescription will be sent to [facility] or [facility]. Instructed patient to contact after he's taken 3rd dose and we will assess titrating to 10 mg and staying on that dose.

## 2024-01-20 ENCOUNTER — Other Ambulatory Visit (HOSPITAL_COMMUNITY): Payer: Self-pay

## 2024-01-20 NOTE — Telephone Encounter (Signed)
 Pharmacy Patient Advocate Encounter   Received notification from Fax that prior authorization for ZEPBOUND  is required/requested.   Insurance verification completed.   The patient is insured through [facility].   Per test claim: Per test claim, medication is not covered due to plan/benefit exclusion, PA not submitted at this time   Per separate encounter notes from [facility], patient's plan stopped covering Zepbound  after 08/04/23; Wegovy  is preferred.

## 2024-01-26 NOTE — Telephone Encounter (Signed)
 Spoke with patient and he would like to continue Zepbound  despite plan exclusion. He is currently receiving Zepbound  for $650/ month through savings card. Informed patient that he can obtain vials for cheaper price but he would like to keep receiving pens at this time. He reports that he may consider Lilly's oral GLP-1 that is set to be available in Spring 2026. Instructed patient to keep me updated and will look forward to hearing from him in ~ month for further Zepbound  titration.

## 2024-02-11 ENCOUNTER — Other Ambulatory Visit: Payer: Self-pay | Admitting: Urology

## 2024-02-11 DIAGNOSIS — R972 Elevated prostate specific antigen [PSA]: Secondary | ICD-10-CM

## 2024-02-12 ENCOUNTER — Telehealth: Payer: Self-pay

## 2024-02-12 ENCOUNTER — Encounter: Payer: Self-pay | Admitting: Urology

## 2024-02-12 ENCOUNTER — Other Ambulatory Visit: Payer: Self-pay | Admitting: Internal Medicine

## 2024-02-12 ENCOUNTER — Other Ambulatory Visit: Payer: Self-pay

## 2024-02-12 MED ORDER — INDOMETHACIN 50 MG PO CAPS: 50.0000 mg | ORAL_CAPSULE | Freq: Three times a day (TID) | ORAL | 3 refills | Status: AC

## 2024-02-12 NOTE — Telephone Encounter (Signed)
 Refill sent in as requested.

## 2024-02-12 NOTE — Telephone Encounter (Signed)
-----   Message from Norleen Kiang, MD sent at 02/12/2024 12:01 PM EST ----- Regarding: Medication refill [place],  Mr. Welliver is having a flare of gout. Please refill his indomethacin  prescription as previous with 3 additional refills. He is in [place] and needs his prescription called in there.  [employer] [phone]  Thanks,  Dr. Kiang

## 2024-02-16 ENCOUNTER — Telehealth: Payer: Self-pay | Admitting: Cardiovascular Disease

## 2024-02-16 DIAGNOSIS — E66812 Obesity, class 2: Secondary | ICD-10-CM

## 2024-02-16 MED ORDER — ZEPBOUND 10 MG/0.5ML ~~LOC~~ SOAJ: 10.0000 mg | SUBCUTANEOUS | 0 refills | Status: DC

## 2024-02-16 MED ORDER — ZEPBOUND 10 MG/0.5ML ~~LOC~~ SOAJ: 10.0000 mg | SUBCUTANEOUS | 0 refills | Status: AC

## 2024-02-16 NOTE — Telephone Encounter (Signed)
 Pt c/o medication issue:  1. Name of Medication:   tirzepatide  (ZEPBOUND ) 7.5 MG/0.5ML Pen   2. How are you currently taking this medication (dosage and times per day)?   As prescribed  3. Are you having a reaction (difficulty breathing--STAT)?   4. What is your medication issue?   Patient stated he will need a new prescription for 10 mg sent to   [id number] - [place], [place] - [address]  Patient stated he is completely out of this medication.

## 2024-02-22 ENCOUNTER — Other Ambulatory Visit: Payer: Self-pay

## 2024-02-22 ENCOUNTER — Telehealth: Payer: Self-pay

## 2024-02-22 MED ORDER — ONDANSETRON HCL 4 MG PO TABS: 4.0000 mg | ORAL_TABLET | ORAL | 3 refills | Status: AC | PRN

## 2024-02-22 MED ORDER — VALACYCLOVIR HCL 1 G PO TABS: 1000.0000 mg | ORAL_TABLET | Freq: Three times a day (TID) | ORAL | 3 refills | Status: AC

## 2024-02-22 NOTE — Telephone Encounter (Signed)
Prescriptions sent to pharmacy and pt aware. 

## 2024-02-22 NOTE — Telephone Encounter (Signed)
-----   Message from Norleen Kiang, MD sent at 02/22/2024  4:19 PM EST ----- Regarding: Meds to be called in [place],  Mr. Buelow is suffering with fever blisters and nausea. Please call in:  Valtrex  1000 mg; #30; Take on PO tid; 3 refills Zofran  4 mg; #30; Take one PO q 4 hours prn nausea.; 3 refills   Thanks,  Dr. Kiang

## 2024-03-29 ENCOUNTER — Other Ambulatory Visit

## 2024-04-05 ENCOUNTER — Ambulatory Visit: Admitting: Physician Assistant
